# Patient Record
Sex: Female | Born: 1945 | Race: White | Hispanic: No | Smoking: Never smoker
Health system: Southern US, Community
[De-identification: ages and names within clinical notes are randomized; demographics above are authoritative.]

## PROBLEM LIST (undated history)

## (undated) DIAGNOSIS — Z932 Ileostomy status: Secondary | ICD-10-CM

## (undated) DIAGNOSIS — T8859XA Other complications of anesthesia, initial encounter: Secondary | ICD-10-CM

## (undated) DIAGNOSIS — IMO0002 Reserved for concepts with insufficient information to code with codable children: Secondary | ICD-10-CM

## (undated) DIAGNOSIS — Z9289 Personal history of other medical treatment: Secondary | ICD-10-CM

## (undated) DIAGNOSIS — Z931 Gastrostomy status: Secondary | ICD-10-CM

## (undated) DIAGNOSIS — C801 Malignant (primary) neoplasm, unspecified: Secondary | ICD-10-CM

## (undated) DIAGNOSIS — N189 Chronic kidney disease, unspecified: Secondary | ICD-10-CM

## (undated) DIAGNOSIS — I10 Essential (primary) hypertension: Secondary | ICD-10-CM

## (undated) DIAGNOSIS — Z789 Other specified health status: Secondary | ICD-10-CM

## (undated) DIAGNOSIS — Z95828 Presence of other vascular implants and grafts: Secondary | ICD-10-CM

## (undated) DIAGNOSIS — Z5189 Encounter for other specified aftercare: Secondary | ICD-10-CM

## (undated) DIAGNOSIS — T4145XA Adverse effect of unspecified anesthetic, initial encounter: Secondary | ICD-10-CM

## (undated) HISTORY — DX: Essential (primary) hypertension: I10

## (undated) HISTORY — DX: Encounter for other specified aftercare: Z51.89

## (undated) HISTORY — PX: CHOLECYSTECTOMY: SHX55

## (undated) HISTORY — PX: VAGINAL HYSTERECTOMY: SUR661

## (undated) HISTORY — DX: Malignant (primary) neoplasm, unspecified: C80.1

---

## 1999-07-16 ENCOUNTER — Other Ambulatory Visit: Admission: RE | Admit: 1999-07-16 | Discharge: 1999-07-16 | Payer: Self-pay | Admitting: *Deleted

## 2003-01-22 ENCOUNTER — Encounter: Admission: RE | Admit: 2003-01-22 | Discharge: 2003-01-22 | Payer: Self-pay | Admitting: Family Medicine

## 2003-01-22 ENCOUNTER — Encounter: Payer: Self-pay | Admitting: Family Medicine

## 2008-02-01 ENCOUNTER — Other Ambulatory Visit: Admission: RE | Admit: 2008-02-01 | Discharge: 2008-02-01 | Payer: Self-pay | Admitting: Family Medicine

## 2008-02-19 DIAGNOSIS — C801 Malignant (primary) neoplasm, unspecified: Secondary | ICD-10-CM

## 2008-02-19 HISTORY — DX: Malignant (primary) neoplasm, unspecified: C80.1

## 2008-02-28 ENCOUNTER — Encounter (HOSPITAL_COMMUNITY): Admission: RE | Admit: 2008-02-28 | Discharge: 2008-05-28 | Payer: Self-pay | Admitting: Gastroenterology

## 2008-04-02 ENCOUNTER — Inpatient Hospital Stay (HOSPITAL_COMMUNITY): Admission: RE | Admit: 2008-04-02 | Discharge: 2008-04-11 | Payer: Self-pay | Admitting: General Surgery

## 2008-04-02 ENCOUNTER — Encounter (INDEPENDENT_AMBULATORY_CARE_PROVIDER_SITE_OTHER): Payer: Self-pay | Admitting: General Surgery

## 2008-04-02 HISTORY — PX: LEFT COLECTOMY: SHX856

## 2008-04-17 ENCOUNTER — Ambulatory Visit: Payer: Self-pay | Admitting: Oncology

## 2008-05-21 LAB — CBC WITH DIFFERENTIAL/PLATELET
EOS%: 1.8 % (ref 0.0–7.0)
Eosinophils Absolute: 0.1 10*3/uL (ref 0.0–0.5)
MCV: 83.3 fL (ref 81.0–101.0)
MONO%: 4.5 % (ref 0.0–13.0)
NEUT#: 3.9 10*3/uL (ref 1.5–6.5)
RBC: 4.57 10*6/uL (ref 3.70–5.32)
RDW: 18.4 % — ABNORMAL HIGH (ref 11.3–14.5)

## 2008-05-24 LAB — COMPREHENSIVE METABOLIC PANEL
ALT: 9 U/L (ref 0–35)
AST: 12 U/L (ref 0–37)
Alkaline Phosphatase: 107 U/L (ref 39–117)
Creatinine, Ser: 0.72 mg/dL (ref 0.40–1.20)
Sodium: 141 mEq/L (ref 135–145)
Total Bilirubin: 0.5 mg/dL (ref 0.3–1.2)

## 2008-05-24 LAB — 5 HIAA, QUANTITATIVE, URINE, 24 HOUR: 5-HIAA, 24 Hr Urine: 13.3 mg/24 h — ABNORMAL HIGH (ref <=6.0)

## 2008-05-24 LAB — CHROMOGRANIN A: Chromogranin A: 19.8 ng/mL (ref <=36.4)

## 2008-07-14 ENCOUNTER — Ambulatory Visit: Payer: Self-pay | Admitting: Oncology

## 2008-07-25 LAB — 5 HIAA, QUANTITATIVE, URINE, 24 HOUR: 5-HIAA, 24 Hr Urine: 11.7 mg/24 h — ABNORMAL HIGH (ref <=6.0)

## 2008-10-24 ENCOUNTER — Ambulatory Visit: Payer: Self-pay | Admitting: Oncology

## 2008-11-01 LAB — 5 HIAA, QUANTITATIVE, URINE, 24 HOUR: Volume, Urine-5HIAA: 2050 mL/24 h

## 2008-12-25 ENCOUNTER — Ambulatory Visit: Payer: Self-pay | Admitting: Oncology

## 2009-02-19 ENCOUNTER — Ambulatory Visit: Payer: Self-pay | Admitting: Oncology

## 2009-03-13 LAB — 5 HIAA, QUANTITATIVE, URINE, 24 HOUR
5-HIAA, 24 Hr Urine: 10.7 mg/24 h — ABNORMAL HIGH (ref <=6.0)
Volume, Urine-5HIAA: 1700 mL/24 h

## 2009-04-23 ENCOUNTER — Ambulatory Visit: Payer: Self-pay | Admitting: Oncology

## 2009-06-18 ENCOUNTER — Ambulatory Visit: Payer: Self-pay | Admitting: Oncology

## 2009-07-18 ENCOUNTER — Ambulatory Visit: Payer: Self-pay | Admitting: Oncology

## 2009-07-18 LAB — CBC WITH DIFFERENTIAL/PLATELET
BASO%: 0.6 % (ref 0.0–2.0)
Eosinophils Absolute: 0.1 10*3/uL (ref 0.0–0.5)
HCT: 40 % (ref 34.8–46.6)
LYMPH%: 21.1 % (ref 14.0–49.7)
MCHC: 34.5 g/dL (ref 31.5–36.0)
MCV: 90.3 fL (ref 79.5–101.0)
MONO#: 0.2 10*3/uL (ref 0.1–0.9)
MONO%: 3.7 % (ref 0.0–14.0)
NEUT%: 72.6 % (ref 38.4–76.8)
Platelets: 252 10*3/uL (ref 145–400)
WBC: 6.4 10*3/uL (ref 3.9–10.3)

## 2009-07-23 LAB — COMPREHENSIVE METABOLIC PANEL
Alkaline Phosphatase: 148 U/L — ABNORMAL HIGH (ref 39–117)
CO2: 24 mEq/L (ref 19–32)
Creatinine, Ser: 0.9 mg/dL (ref 0.40–1.20)
Glucose, Bld: 142 mg/dL — ABNORMAL HIGH (ref 70–99)
Total Bilirubin: 0.4 mg/dL (ref 0.3–1.2)

## 2009-07-23 LAB — CHROMOGRANIN A

## 2009-08-11 ENCOUNTER — Ambulatory Visit (HOSPITAL_COMMUNITY): Admission: RE | Admit: 2009-08-11 | Discharge: 2009-08-11 | Payer: Self-pay | Admitting: Oncology

## 2009-08-14 ENCOUNTER — Ambulatory Visit: Payer: Self-pay | Admitting: Oncology

## 2009-09-15 ENCOUNTER — Ambulatory Visit: Payer: Self-pay | Admitting: Oncology

## 2009-10-10 ENCOUNTER — Ambulatory Visit: Payer: Self-pay | Admitting: Oncology

## 2009-11-06 ENCOUNTER — Ambulatory Visit: Payer: Self-pay | Admitting: Oncology

## 2009-12-09 ENCOUNTER — Ambulatory Visit: Payer: Self-pay | Admitting: Oncology

## 2010-01-08 ENCOUNTER — Ambulatory Visit: Payer: Self-pay | Admitting: Oncology

## 2010-01-12 LAB — CBC WITH DIFFERENTIAL/PLATELET
Eosinophils Absolute: 0.1 10*3/uL (ref 0.0–0.5)
HCT: 38.7 % (ref 34.8–46.6)
LYMPH%: 21 % (ref 14.0–49.7)
MCH: 31.4 pg (ref 25.1–34.0)
MCHC: 34 g/dL (ref 31.5–36.0)
MCV: 92.5 fL (ref 79.5–101.0)
MONO#: 0.4 10*3/uL (ref 0.1–0.9)
NEUT#: 4.9 10*3/uL (ref 1.5–6.5)
Platelets: 252 10*3/uL (ref 145–400)
lymph#: 1.4 10*3/uL (ref 0.9–3.3)

## 2010-01-12 LAB — COMPREHENSIVE METABOLIC PANEL
ALT: 42 U/L — ABNORMAL HIGH (ref 0–35)
AST: 51 U/L — ABNORMAL HIGH (ref 0–37)
Albumin: 3.7 g/dL (ref 3.5–5.2)
CO2: 27 mEq/L (ref 19–32)
Calcium: 8.8 mg/dL (ref 8.4–10.5)
Creatinine, Ser: 0.69 mg/dL (ref 0.40–1.20)

## 2010-01-15 LAB — 5 HIAA, QUANTITATIVE, URINE, 24 HOUR: 5-HIAA, 24 Hr Urine: 22 mg/24 h — ABNORMAL HIGH (ref <=6.0)

## 2010-01-16 LAB — CHROMOGRANIN A: Chromogranin A: 34 ng/mL — ABNORMAL HIGH (ref 1.9–15.0)

## 2010-02-03 LAB — COMPREHENSIVE METABOLIC PANEL
ALT: 48 U/L — ABNORMAL HIGH (ref 0–35)
AST: 67 U/L — ABNORMAL HIGH (ref 0–37)
CO2: 25 mEq/L (ref 19–32)
Calcium: 9.2 mg/dL (ref 8.4–10.5)
Potassium: 4.1 mEq/L (ref 3.5–5.3)
Sodium: 135 mEq/L (ref 135–145)
Total Protein: 7.6 g/dL (ref 6.0–8.3)

## 2010-02-10 LAB — CHROMOGRANIN A: Chromogranin A: 34 ng/mL — ABNORMAL HIGH (ref 1.9–15.0)

## 2010-02-25 ENCOUNTER — Ambulatory Visit (HOSPITAL_COMMUNITY): Admission: RE | Admit: 2010-02-25 | Discharge: 2010-02-25 | Payer: Self-pay | Admitting: Oncology

## 2010-02-27 ENCOUNTER — Ambulatory Visit: Payer: Self-pay | Admitting: Oncology

## 2010-03-31 ENCOUNTER — Ambulatory Visit: Payer: Self-pay | Admitting: Oncology

## 2010-04-14 ENCOUNTER — Ambulatory Visit (HOSPITAL_COMMUNITY): Admission: RE | Admit: 2010-04-14 | Discharge: 2010-04-14 | Payer: Self-pay | Admitting: Oncology

## 2010-05-22 ENCOUNTER — Ambulatory Visit: Payer: Self-pay | Admitting: Oncology

## 2010-05-25 LAB — CBC WITH DIFFERENTIAL/PLATELET
Basophils Absolute: 0.1 10*3/uL (ref 0.0–0.1)
HGB: 12.6 g/dL (ref 11.6–15.9)
MCH: 30.7 pg (ref 25.1–34.0)
MCHC: 33.9 g/dL (ref 31.5–36.0)
MCV: 90.5 fL (ref 79.5–101.0)
MONO#: 0.4 10*3/uL (ref 0.1–0.9)
MONO%: 5.2 % (ref 0.0–14.0)
NEUT%: 72.6 % (ref 38.4–76.8)
WBC: 8.4 10*3/uL (ref 3.9–10.3)
lymph#: 1.7 10*3/uL (ref 0.9–3.3)
nRBC: 0 % (ref 0–0)

## 2010-06-08 ENCOUNTER — Inpatient Hospital Stay (HOSPITAL_COMMUNITY): Admission: EM | Admit: 2010-06-08 | Discharge: 2010-06-17 | Payer: Self-pay | Admitting: Emergency Medicine

## 2010-06-08 ENCOUNTER — Encounter (INDEPENDENT_AMBULATORY_CARE_PROVIDER_SITE_OTHER): Payer: Self-pay | Admitting: Surgery

## 2010-06-08 HISTORY — PX: BOWEL RESECTION: SHX1257

## 2010-07-01 ENCOUNTER — Ambulatory Visit: Payer: Self-pay | Admitting: Oncology

## 2010-07-26 LAB — 5 HIAA, QUANTITATIVE, URINE, 24 HOUR: Volume, Urine-5HIAA: 1325 mL/24 h

## 2010-07-31 ENCOUNTER — Ambulatory Visit: Payer: Self-pay | Admitting: Oncology

## 2010-08-27 LAB — CBC WITH DIFFERENTIAL/PLATELET
EOS%: 1.8 % (ref 0.0–7.0)
Eosinophils Absolute: 0.1 10*3/uL (ref 0.0–0.5)
HCT: 37.5 % (ref 34.8–46.6)
HGB: 12.3 g/dL (ref 11.6–15.9)
LYMPH%: 28.6 % (ref 14.0–49.7)
MCH: 28.6 pg (ref 25.1–34.0)
MCHC: 32.7 g/dL (ref 31.5–36.0)
MCV: 87.5 fL (ref 79.5–101.0)
Platelets: 217 10*3/uL (ref 145–400)
RBC: 4.29 10*6/uL (ref 3.70–5.45)
RDW: 16.1 % — ABNORMAL HIGH (ref 11.2–14.5)
WBC: 5.3 10*3/uL (ref 3.9–10.3)
lymph#: 1.5 10*3/uL (ref 0.9–3.3)

## 2010-08-27 LAB — COMPREHENSIVE METABOLIC PANEL
ALT: 31 U/L (ref 0–35)
AST: 51 U/L — ABNORMAL HIGH (ref 0–37)
Chloride: 102 mEq/L (ref 96–112)
Potassium: 4.5 mEq/L (ref 3.5–5.3)
Total Bilirubin: 0.6 mg/dL (ref 0.3–1.2)

## 2010-09-22 ENCOUNTER — Ambulatory Visit: Payer: Self-pay | Admitting: Oncology

## 2010-09-24 LAB — COMPREHENSIVE METABOLIC PANEL
AST: 57 U/L — ABNORMAL HIGH (ref 0–37)
Albumin: 4.3 g/dL (ref 3.5–5.2)
BUN: 23 mg/dL (ref 6–23)
CO2: 26 mEq/L (ref 19–32)
Chloride: 100 mEq/L (ref 96–112)
Creatinine, Ser: 1.27 mg/dL — ABNORMAL HIGH (ref 0.40–1.20)
Glucose, Bld: 153 mg/dL — ABNORMAL HIGH (ref 70–99)
Potassium: 5 mEq/L (ref 3.5–5.3)
Sodium: 141 mEq/L (ref 135–145)
Total Bilirubin: 0.7 mg/dL (ref 0.3–1.2)
Total Protein: 7.3 g/dL (ref 6.0–8.3)

## 2010-09-24 LAB — CBC WITH DIFFERENTIAL/PLATELET
Eosinophils Absolute: 0.1 10*3/uL (ref 0.0–0.5)
LYMPH%: 29.7 % (ref 14.0–49.7)
MCHC: 33.8 g/dL (ref 31.5–36.0)
MCV: 87 fL (ref 79.5–101.0)
MONO#: 0.3 10*3/uL (ref 0.1–0.9)
RBC: 4.59 10*6/uL (ref 3.70–5.45)
RDW: 15.7 % — ABNORMAL HIGH (ref 11.2–14.5)
lymph#: 1.9 10*3/uL (ref 0.9–3.3)

## 2010-10-02 LAB — COMPREHENSIVE METABOLIC PANEL
Albumin: 4.2 g/dL (ref 3.5–5.2)
Alkaline Phosphatase: 175 U/L — ABNORMAL HIGH (ref 39–117)
Chloride: 104 mEq/L (ref 96–112)
Creatinine, Ser: 1.15 mg/dL (ref 0.40–1.20)
Glucose, Bld: 115 mg/dL — ABNORMAL HIGH (ref 70–99)
Potassium: 5.1 mEq/L (ref 3.5–5.3)
Sodium: 139 mEq/L (ref 135–145)
Total Bilirubin: 0.7 mg/dL (ref 0.3–1.2)
Total Protein: 7.3 g/dL (ref 6.0–8.3)

## 2010-10-02 LAB — CBC WITH DIFFERENTIAL/PLATELET
HCT: 36.5 % (ref 34.8–46.6)
MCHC: 34.3 g/dL (ref 31.5–36.0)
MONO#: 0.3 10*3/uL (ref 0.1–0.9)
NEUT#: 2.9 10*3/uL (ref 1.5–6.5)
RDW: 16.1 % — ABNORMAL HIGH (ref 11.2–14.5)

## 2010-10-22 ENCOUNTER — Ambulatory Visit: Payer: Self-pay | Admitting: Oncology

## 2010-10-30 LAB — CBC WITH DIFFERENTIAL/PLATELET
HCT: 37.5 % (ref 34.8–46.6)
HGB: 12.8 g/dL (ref 11.6–15.9)
LYMPH%: 25.3 % (ref 14.0–49.7)
MCHC: 34.1 g/dL (ref 31.5–36.0)
Platelets: 219 10*3/uL (ref 145–400)

## 2010-10-30 LAB — COMPREHENSIVE METABOLIC PANEL
ALT: 24 U/L (ref 0–35)
AST: 40 U/L — ABNORMAL HIGH (ref 0–37)
Albumin: 4.3 g/dL (ref 3.5–5.2)
BUN: 19 mg/dL (ref 6–23)
CO2: 25 mEq/L (ref 19–32)
Creatinine, Ser: 1.12 mg/dL (ref 0.40–1.20)
Potassium: 4.2 mEq/L (ref 3.5–5.3)
Sodium: 137 mEq/L (ref 135–145)
Total Bilirubin: 1 mg/dL (ref 0.3–1.2)
Total Protein: 8 g/dL (ref 6.0–8.3)

## 2010-11-24 ENCOUNTER — Ambulatory Visit: Payer: Self-pay | Admitting: Oncology

## 2010-11-24 LAB — 5 HIAA, QUANTITATIVE, URINE, 24 HOUR: 5-HIAA, 24 Hr Urine: 17.6 mg/24 h — ABNORMAL HIGH (ref <=6.0)

## 2010-11-26 LAB — CBC WITH DIFFERENTIAL/PLATELET
BASO%: 0.6 % (ref 0.0–2.0)
Basophils Absolute: 0 10*3/uL (ref 0.0–0.1)
EOS%: 2.2 % (ref 0.0–7.0)
LYMPH%: 22 % (ref 14.0–49.7)
MCH: 32.1 pg (ref 25.1–34.0)
MCHC: 33.4 g/dL (ref 31.5–36.0)
MCV: 96 fL (ref 79.5–101.0)
NEUT#: 3.5 10*3/uL (ref 1.5–6.5)
Platelets: 198 10*3/uL (ref 145–400)
WBC: 5 10*3/uL (ref 3.9–10.3)

## 2010-12-01 LAB — COMPREHENSIVE METABOLIC PANEL
AST: 37 U/L (ref 0–37)
Albumin: 4.2 g/dL (ref 3.5–5.2)
CO2: 28 mEq/L (ref 19–32)
Chloride: 101 mEq/L (ref 96–112)
Creatinine, Ser: 0.86 mg/dL (ref 0.40–1.20)
Glucose, Bld: 83 mg/dL (ref 70–99)

## 2010-12-01 LAB — CHROMOGRANIN A: Chromogranin A: 42 ng/mL — ABNORMAL HIGH (ref 1.9–15.0)

## 2010-12-24 ENCOUNTER — Ambulatory Visit: Payer: Self-pay | Admitting: Oncology

## 2010-12-25 LAB — CBC WITH DIFFERENTIAL/PLATELET
BASO%: 0.2 % (ref 0.0–2.0)
Basophils Absolute: 0 10*3/uL (ref 0.0–0.1)
EOS%: 2 % (ref 0.0–7.0)
Eosinophils Absolute: 0.1 10*3/uL (ref 0.0–0.5)
HCT: 35.7 % (ref 34.8–46.6)
HGB: 12.2 g/dL (ref 11.6–15.9)
LYMPH%: 23 % (ref 14.0–49.7)
MCH: 34.8 pg — ABNORMAL HIGH (ref 25.1–34.0)
MCHC: 34.2 g/dL (ref 31.5–36.0)
MCV: 101.8 fL — ABNORMAL HIGH (ref 79.5–101.0)
MONO#: 0.2 10*3/uL (ref 0.1–0.9)
MONO%: 4.2 % (ref 0.0–14.0)
NEUT#: 3.7 10*3/uL (ref 1.5–6.5)
NEUT%: 70.6 % (ref 38.4–76.8)
Platelets: 228 10*3/uL (ref 145–400)
RBC: 3.51 10*6/uL — ABNORMAL LOW (ref 3.70–5.45)
RDW: 19.2 % — ABNORMAL HIGH (ref 11.2–14.5)
WBC: 5.3 10*3/uL (ref 3.9–10.3)
lymph#: 1.2 10*3/uL (ref 0.9–3.3)

## 2010-12-25 LAB — COMPREHENSIVE METABOLIC PANEL
ALT: 20 U/L (ref 0–35)
AST: 33 U/L (ref 0–37)
Albumin: 4.1 g/dL (ref 3.5–5.2)
Alkaline Phosphatase: 155 U/L — ABNORMAL HIGH (ref 39–117)
BUN: 20 mg/dL (ref 6–23)
CO2: 24 mEq/L (ref 19–32)
Calcium: 8.8 mg/dL (ref 8.4–10.5)
Chloride: 104 mEq/L (ref 96–112)
Creatinine, Ser: 1.06 mg/dL (ref 0.40–1.20)
Glucose, Bld: 156 mg/dL — ABNORMAL HIGH (ref 70–99)
Potassium: 4.3 mEq/L (ref 3.5–5.3)
Sodium: 139 mEq/L (ref 135–145)
Total Bilirubin: 0.8 mg/dL (ref 0.3–1.2)
Total Protein: 7.2 g/dL (ref 6.0–8.3)

## 2011-01-29 ENCOUNTER — Other Ambulatory Visit: Payer: Self-pay | Admitting: Oncology

## 2011-01-29 ENCOUNTER — Encounter (HOSPITAL_BASED_OUTPATIENT_CLINIC_OR_DEPARTMENT_OTHER): Payer: BC Managed Care – PPO | Admitting: Oncology

## 2011-01-29 DIAGNOSIS — C7A012 Malignant carcinoid tumor of the ileum: Secondary | ICD-10-CM

## 2011-01-29 DIAGNOSIS — I10 Essential (primary) hypertension: Secondary | ICD-10-CM

## 2011-01-29 DIAGNOSIS — R197 Diarrhea, unspecified: Secondary | ICD-10-CM

## 2011-01-29 LAB — COMPREHENSIVE METABOLIC PANEL
ALT: 15 U/L (ref 0–35)
Alkaline Phosphatase: 147 U/L — ABNORMAL HIGH (ref 39–117)
CO2: 25 mEq/L (ref 19–32)
Glucose, Bld: 108 mg/dL — ABNORMAL HIGH (ref 70–99)
Potassium: 4.5 mEq/L (ref 3.5–5.3)
Total Bilirubin: 0.5 mg/dL (ref 0.3–1.2)

## 2011-01-29 LAB — CBC WITH DIFFERENTIAL/PLATELET
EOS%: 1.9 % (ref 0.0–7.0)
LYMPH%: 22.4 % (ref 14.0–49.7)
MCH: 34.9 pg — ABNORMAL HIGH (ref 25.1–34.0)
NEUT#: 4.1 10*3/uL (ref 1.5–6.5)
NEUT%: 69.9 % (ref 38.4–76.8)
RBC: 3.52 10*6/uL — ABNORMAL LOW (ref 3.70–5.45)
RDW: 15.7 % — ABNORMAL HIGH (ref 11.2–14.5)

## 2011-02-11 ENCOUNTER — Encounter (HOSPITAL_BASED_OUTPATIENT_CLINIC_OR_DEPARTMENT_OTHER): Payer: BC Managed Care – PPO | Admitting: Oncology

## 2011-02-11 DIAGNOSIS — C7A012 Malignant carcinoid tumor of the ileum: Secondary | ICD-10-CM

## 2011-02-24 ENCOUNTER — Other Ambulatory Visit: Payer: Self-pay | Admitting: Oncology

## 2011-02-24 ENCOUNTER — Encounter (HOSPITAL_BASED_OUTPATIENT_CLINIC_OR_DEPARTMENT_OTHER): Payer: BC Managed Care – PPO | Admitting: Oncology

## 2011-02-24 DIAGNOSIS — C7A012 Malignant carcinoid tumor of the ileum: Secondary | ICD-10-CM

## 2011-02-24 DIAGNOSIS — I10 Essential (primary) hypertension: Secondary | ICD-10-CM

## 2011-02-24 DIAGNOSIS — R197 Diarrhea, unspecified: Secondary | ICD-10-CM

## 2011-02-24 LAB — CBC WITH DIFFERENTIAL/PLATELET
BASO%: 0.4 % (ref 0.0–2.0)
Basophils Absolute: 0 10*3/uL (ref 0.0–0.1)
EOS%: 1.4 % (ref 0.0–7.0)
Eosinophils Absolute: 0.1 10*3/uL (ref 0.0–0.5)
LYMPH%: 21.9 % (ref 14.0–49.7)
MCV: 100 fL (ref 79.5–101.0)
MONO%: 5.1 % (ref 0.0–14.0)
Platelets: 184 10*3/uL (ref 145–400)
RDW: 15.9 % — ABNORMAL HIGH (ref 11.2–14.5)
lymph#: 1.2 10*3/uL (ref 0.9–3.3)

## 2011-02-27 LAB — 5 HIAA, QUANTITATIVE, URINE, 24 HOUR: Volume, Urine-5HIAA: 1475 mL/24 h

## 2011-02-28 LAB — COMPREHENSIVE METABOLIC PANEL
ALT: 17 U/L (ref 0–35)
AST: 30 U/L (ref 0–37)
Alkaline Phosphatase: 135 U/L — ABNORMAL HIGH (ref 39–117)
Calcium: 9.1 mg/dL (ref 8.4–10.5)
Potassium: 4.6 mEq/L (ref 3.5–5.3)
Sodium: 140 mEq/L (ref 135–145)
Total Bilirubin: 1 mg/dL (ref 0.3–1.2)

## 2011-03-05 ENCOUNTER — Encounter (HOSPITAL_BASED_OUTPATIENT_CLINIC_OR_DEPARTMENT_OTHER): Payer: BC Managed Care – PPO | Admitting: Oncology

## 2011-03-05 DIAGNOSIS — C7A012 Malignant carcinoid tumor of the ileum: Secondary | ICD-10-CM

## 2011-03-07 LAB — GLUCOSE, CAPILLARY
Glucose-Capillary: 117 mg/dL — ABNORMAL HIGH (ref 70–99)
Glucose-Capillary: 135 mg/dL — ABNORMAL HIGH (ref 70–99)
Glucose-Capillary: 138 mg/dL — ABNORMAL HIGH (ref 70–99)
Glucose-Capillary: 138 mg/dL — ABNORMAL HIGH (ref 70–99)
Glucose-Capillary: 145 mg/dL — ABNORMAL HIGH (ref 70–99)
Glucose-Capillary: 145 mg/dL — ABNORMAL HIGH (ref 70–99)
Glucose-Capillary: 149 mg/dL — ABNORMAL HIGH (ref 70–99)
Glucose-Capillary: 158 mg/dL — ABNORMAL HIGH (ref 70–99)
Glucose-Capillary: 161 mg/dL — ABNORMAL HIGH (ref 70–99)
Glucose-Capillary: 170 mg/dL — ABNORMAL HIGH (ref 70–99)
Glucose-Capillary: 176 mg/dL — ABNORMAL HIGH (ref 70–99)
Glucose-Capillary: 179 mg/dL — ABNORMAL HIGH (ref 70–99)
Glucose-Capillary: 181 mg/dL — ABNORMAL HIGH (ref 70–99)
Glucose-Capillary: 184 mg/dL — ABNORMAL HIGH (ref 70–99)
Glucose-Capillary: 196 mg/dL — ABNORMAL HIGH (ref 70–99)
Glucose-Capillary: 198 mg/dL — ABNORMAL HIGH (ref 70–99)
Glucose-Capillary: 201 mg/dL — ABNORMAL HIGH (ref 70–99)
Glucose-Capillary: 202 mg/dL — ABNORMAL HIGH (ref 70–99)

## 2011-03-07 LAB — CBC
HCT: 23.1 % — ABNORMAL LOW (ref 36.0–46.0)
HCT: 39.2 % (ref 36.0–46.0)
HCT: 40.9 % (ref 36.0–46.0)
Hemoglobin: 10.7 g/dL — ABNORMAL LOW (ref 12.0–15.0)
Hemoglobin: 8.1 g/dL — ABNORMAL LOW (ref 12.0–15.0)
MCHC: 32.4 g/dL (ref 30.0–36.0)
MCHC: 32.8 g/dL (ref 30.0–36.0)
MCHC: 32.9 g/dL (ref 30.0–36.0)
MCHC: 34 g/dL (ref 30.0–36.0)
MCV: 91.2 fL (ref 78.0–100.0)
MCV: 93.1 fL (ref 78.0–100.0)
MCV: 93.4 fL (ref 78.0–100.0)
Platelets: 261 10*3/uL (ref 150–400)
Platelets: 346 10*3/uL (ref 150–400)
Platelets: 353 10*3/uL (ref 150–400)
RBC: 2.66 MIL/uL — ABNORMAL LOW (ref 3.87–5.11)
RBC: 2.74 MIL/uL — ABNORMAL LOW (ref 3.87–5.11)
RBC: 4.19 MIL/uL (ref 3.87–5.11)
RDW: 13.7 % (ref 11.5–15.5)
RDW: 13.8 % (ref 11.5–15.5)
RDW: 14 % (ref 11.5–15.5)
WBC: 10.1 10*3/uL (ref 4.0–10.5)
WBC: 20.1 10*3/uL — ABNORMAL HIGH (ref 4.0–10.5)
WBC: 8.5 10*3/uL (ref 4.0–10.5)

## 2011-03-07 LAB — DIFFERENTIAL
Basophils Absolute: 0 10*3/uL (ref 0.0–0.1)
Basophils Absolute: 0 10*3/uL (ref 0.0–0.1)
Basophils Absolute: 0.1 10*3/uL (ref 0.0–0.1)
Basophils Relative: 0 % (ref 0–1)
Basophils Relative: 0 % (ref 0–1)
Basophils Relative: 1 % (ref 0–1)
Basophils Relative: 1 % (ref 0–1)
Eosinophils Absolute: 0 10*3/uL (ref 0.0–0.7)
Eosinophils Absolute: 0 10*3/uL (ref 0.0–0.7)
Eosinophils Absolute: 0 10*3/uL (ref 0.0–0.7)
Eosinophils Relative: 0 % (ref 0–5)
Eosinophils Relative: 1 % (ref 0–5)
Lymphocytes Relative: 20 % (ref 12–46)
Lymphs Abs: 0.7 10*3/uL (ref 0.7–4.0)
Lymphs Abs: 0.8 10*3/uL (ref 0.7–4.0)
Lymphs Abs: 4 10*3/uL (ref 0.7–4.0)
Monocytes Absolute: 0.2 10*3/uL (ref 0.1–1.0)
Monocytes Absolute: 0.2 10*3/uL (ref 0.1–1.0)
Monocytes Relative: 3 % (ref 3–12)
Monocytes Relative: 6 % (ref 3–12)
Neutro Abs: 15.4 10*3/uL — ABNORMAL HIGH (ref 1.7–7.7)
Neutro Abs: 7 10*3/uL (ref 1.7–7.7)
Neutro Abs: 8.3 10*3/uL — ABNORMAL HIGH (ref 1.7–7.7)
Neutrophils Relative %: 76 % (ref 43–77)
Neutrophils Relative %: 82 % — ABNORMAL HIGH (ref 43–77)
Neutrophils Relative %: 88 % — ABNORMAL HIGH (ref 43–77)
Neutrophils Relative %: 90 % — ABNORMAL HIGH (ref 43–77)

## 2011-03-07 LAB — BASIC METABOLIC PANEL
BUN: 15 mg/dL (ref 6–23)
BUN: 16 mg/dL (ref 6–23)
BUN: 17 mg/dL (ref 6–23)
BUN: 7 mg/dL (ref 6–23)
BUN: 8 mg/dL (ref 6–23)
CO2: 21 mEq/L (ref 19–32)
CO2: 22 mEq/L (ref 19–32)
CO2: 24 mEq/L (ref 19–32)
CO2: 26 mEq/L (ref 19–32)
Calcium: 7.2 mg/dL — ABNORMAL LOW (ref 8.4–10.5)
Calcium: 7.5 mg/dL — ABNORMAL LOW (ref 8.4–10.5)
Calcium: 7.9 mg/dL — ABNORMAL LOW (ref 8.4–10.5)
Calcium: 8 mg/dL — ABNORMAL LOW (ref 8.4–10.5)
Chloride: 102 mEq/L (ref 96–112)
Chloride: 104 mEq/L (ref 96–112)
Chloride: 107 mEq/L (ref 96–112)
Creatinine, Ser: 0.58 mg/dL (ref 0.4–1.2)
Creatinine, Ser: 0.65 mg/dL (ref 0.4–1.2)
Creatinine, Ser: 1 mg/dL (ref 0.4–1.2)
Creatinine, Ser: 1.07 mg/dL (ref 0.4–1.2)
Creatinine, Ser: 1.08 mg/dL (ref 0.4–1.2)
GFR calc Af Amer: >60 mL/min (ref >60)
GFR calc non Af Amer: 51 mL/min — ABNORMAL LOW (ref >60)
GFR calc non Af Amer: 52 mL/min — ABNORMAL LOW (ref >60)
GFR calc non Af Amer: 55 mL/min — ABNORMAL LOW (ref >60)
GFR calc non Af Amer: >60 mL/min (ref >60)
Glucose, Bld: 139 mg/dL — ABNORMAL HIGH (ref 70–99)
Glucose, Bld: 198 mg/dL — ABNORMAL HIGH (ref 70–99)
Glucose, Bld: 211 mg/dL — ABNORMAL HIGH (ref 70–99)
Glucose, Bld: 241 mg/dL — ABNORMAL HIGH (ref 70–99)
Potassium: 3.3 mEq/L — ABNORMAL LOW (ref 3.5–5.1)
Potassium: 6 mEq/L — ABNORMAL HIGH (ref 3.5–5.1)
Sodium: 136 mEq/L (ref 135–145)
Sodium: 136 mEq/L (ref 135–145)

## 2011-03-07 LAB — COMPREHENSIVE METABOLIC PANEL
ALT: 17 U/L (ref 0–35)
AST: 16 U/L (ref 0–37)
Alkaline Phosphatase: 62 U/L (ref 39–117)
Alkaline Phosphatase: 94 U/L (ref 39–117)
BUN: 10 mg/dL (ref 6–23)
CO2: 27 mEq/L (ref 19–32)
CO2: 29 mEq/L (ref 19–32)
Chloride: 102 mEq/L (ref 96–112)
GFR calc Af Amer: >60 mL/min (ref >60)
GFR calc non Af Amer: >60 mL/min (ref >60)
GFR calc non Af Amer: >60 mL/min (ref >60)
Glucose, Bld: 157 mg/dL — ABNORMAL HIGH (ref 70–99)
Glucose, Bld: 165 mg/dL — ABNORMAL HIGH (ref 70–99)
Potassium: 3 mEq/L — ABNORMAL LOW (ref 3.5–5.1)
Potassium: 4 mEq/L (ref 3.5–5.1)
Sodium: 136 mEq/L (ref 135–145)
Total Bilirubin: 0.7 mg/dL (ref 0.3–1.2)
Total Protein: 4.9 g/dL — ABNORMAL LOW (ref 6.0–8.3)

## 2011-03-07 LAB — PREALBUMIN: Prealbumin: 8.6 mg/dL — ABNORMAL LOW (ref 18.0–45.0)

## 2011-03-07 LAB — PHOSPHORUS
Phosphorus: 2.5 mg/dL (ref 2.3–4.6)
Phosphorus: 4.5 mg/dL (ref 2.3–4.6)

## 2011-03-07 LAB — URINE MICROSCOPIC-ADD ON

## 2011-03-07 LAB — LACTIC ACID, PLASMA: Lactic Acid, Venous: 8.7 mmol/L — ABNORMAL HIGH (ref 0.5–2.2)

## 2011-03-07 LAB — TRIGLYCERIDES: Triglycerides: 170 mg/dL — ABNORMAL HIGH (ref <150)

## 2011-03-07 LAB — HEMOGLOBIN A1C
Hgb A1c MFr Bld: 7.8 % — ABNORMAL HIGH (ref <5.7)
Mean Plasma Glucose: 177 mg/dL — ABNORMAL HIGH (ref <117)

## 2011-03-07 LAB — MAGNESIUM
Magnesium: 1.7 mg/dL (ref 1.5–2.5)
Magnesium: 1.8 mg/dL (ref 1.5–2.5)

## 2011-03-07 LAB — URINALYSIS, ROUTINE W REFLEX MICROSCOPIC
Glucose, UA: >1000 mg/dL — AB
Leukocytes, UA: NEGATIVE
Specific Gravity, Urine: >1.046 — ABNORMAL HIGH (ref 1.005–1.030)
pH: 5 (ref 5.0–8.0)

## 2011-03-12 ENCOUNTER — Encounter (HOSPITAL_BASED_OUTPATIENT_CLINIC_OR_DEPARTMENT_OTHER): Payer: BC Managed Care – PPO | Admitting: Oncology

## 2011-03-12 DIAGNOSIS — R197 Diarrhea, unspecified: Secondary | ICD-10-CM

## 2011-03-12 DIAGNOSIS — C7A012 Malignant carcinoid tumor of the ileum: Secondary | ICD-10-CM

## 2011-04-09 ENCOUNTER — Encounter (HOSPITAL_BASED_OUTPATIENT_CLINIC_OR_DEPARTMENT_OTHER): Payer: BC Managed Care – PPO | Admitting: Oncology

## 2011-04-09 DIAGNOSIS — C7A012 Malignant carcinoid tumor of the ileum: Secondary | ICD-10-CM

## 2011-05-04 NOTE — Discharge Summary (Signed)
NAME:  Lori Dennis, Lori Dennis NO.:  [id number]   MEDICAL RECORD NO.:  [id number]         PATIENT TYPE:  LINP   LOCATION:                               FACILITY:  [facility]   PHYSICIAN:  Adolph Pollack, M.D.DATE OF BIRTH:  [birth year]   DATE OF ADMISSION:  04/02/2008  DATE OF DISCHARGE:  04/11/2008                               DISCHARGE SUMMARY   FINAL DIAGNOSIS:  Metastatic carcinoid tumor.   SECONDARY DIAGNOSES:  1. Partial small-bowel obstruction.  2. Hypertension.  3. Anxiety with depression.  4. Hyperlipidemia.  5. Acute blood loss anemia.  6. Hypovolemia.  7. Postoperative ileus.  8. Wound infection.   PROCEDURE:  Diagnostic laparoscopy, exploratory laparotomy with sigmoid  colectomy and biopsy, biopsy of omental nodule, extended right  colectomy, descending colostomy, gastrojejunostomy on 04/02/2008.   REASON FOR ADMISSION:  This 65 year old female had been having  surveillance on her ovary, her sister had ovarian cancer, and she was  noted to have a mass in the right lower quadrant abdominal area.  On CT  scan, this is suspicious for a carcinoid tumor.  Colonoscopy was  performed and Dr. Randa Evens noted narrowing of the sigmoid colon and  abnormality of the ileocecal valve.  Biopsy of the ileocecal valve  demonstrated findings consistent with carcinoid tumor.  She had  partially obstructing symptoms, but then these improved after the  swelling from colonoscopy went down and she was admitted for an elective  procedure.   HOSPITAL COURSE:  She underwent the above procedure.  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  She had three points of partial obstruction, one in  the mid sigmoid colon, one at the ileocecal valve, and one at the third  portion of duodenum, were tumor was growing into the duodenum which had  involved the right colon.  She subsequently underwent that above  procedure.   Postoperatively she had some hypovolemia, which was improved  IV fluids, and also had some acute blood loss anemia.  She was given a  transfusion for this, with a total of approximately 3 units of packed  red blood cells over a couple days.  She had an ileus with some bilious  NG output and she was watched in the step-down.  She was then  transferred to the floor.  She began having colostomy output and, as  such, we were able to remove the NG tube and start her on a diet.  There  were some problems with leakage around the colostomy appliance, but the  wound care nurses saw her and gave her special appliances and this  controlled it well.  She had some erythema of the wound and drainage.  This was opened up and the wound infection was drained and packed and  the wound looked good.  We prepared her for home health nursing.  On  04/11/2008, she was tolerating a diet, was feeling stronger, ambulating,  had good bowel function, and was discharged.   DISPOSITION:  Discharged to home on 04/11/2008 in satisfactory  condition.  Will have daily home health nursing visits arranged  for her  to assist her with colostomy care, as well as wound care.  The plan is  to arrange for an outpatient medical oncology visit for further  evaluation and potential treatment of her widespread carcinoid tumor  with intraperitoneal involvement, as she had had some carcinoid-type  symptoms prior to this.  We will keep her on all of her home  medications, give her Vicodin for pain and iron for anemia, and to see  her back in the office in one to two weeks.      Adolph Pollack, M.D.  Electronically Signed     TJR/MEDQ  D:  04/22/2008  T:  04/22/2008  Job:  [id number]   cc:   Fayrene Fearing L. Malon Kindle., M.D.  Fax: [phone]   Oswaldo Done, N.P.

## 2011-05-04 NOTE — Op Note (Signed)
NAME:  Lori Dennis, Lori Dennis NO.:  [id number]   MEDICAL RECORD NO.:  [id number]          PATIENT TYPE:  INP   LOCATION:  1235                         FACILITY:  [facility]   PHYSICIAN:  Adolph Pollack, M.D.DATE OF BIRTH:  02/03/1946   DATE OF PROCEDURE:  04/02/2008  DATE OF DISCHARGE:                               OPERATIVE REPORT   PREOPERATIVE DIAGNOSIS:  Abdominal carcinoid tumor.   POSTOPERATIVE DIAGNOSIS:  Widely metastatic intraperitoneal carcinoid  tumor.   PROCEDURES:  1. Diagnostic laparoscopy.  2. Exploratory laparotomy, biopsy of omental nodule, sigmoid colectomy      with descending colostomy, extended right colectomy,      gastrojejunostomy.   SURGEON:  Adolph Pollack, M.D.   ASSISTANT:  Claud Kelp, M.D.   ANESTHESIA:  General.   FINDINGS:  There was widespread miliary and 1 cm or slightly more tumor  throughout the abdominal cavity in the mesentery with fairly heavily  concentration in the pelvis including the serosal surface of the rectum  and distal sigmoid colon.  There was a very small involvement of the  liver.  There was partial obstruction of the mid sigmoid colon from  tumor.  There was near complete obstruction of the right colon at the  ileocecal valve.  There was dense adherence of tumor to the duodenum  with impending obstruction here.   INDICATIONS:  This is a 65 year old female who was noted to have an  abnormality of one of her ovaries on an ultrasound.  A CT scan  demonstrated a mass in the right abdominal area.  She underwent a  colonoscopy.  An abnormality at the ileocecal valve was noted as well as  some of the sigmoid colon.  Colon biopsy was consistent with  inflammatory changes.  In the ileocecal valve biopsy was consistent with  carcinoid tumor.  She has an elevation of 5 HIA level in the urine.  She  had an octreotide scan which shows the area in the right abdomen.  She  now presents for resection of the carcinoid  tumor.   TECHNIQUE:  She was seen in the holding area and brought to the  operating room, placed supine on the operating table and a general  anesthetic was administered.  Foley catheter was placed in bladder.  The  abdominal wall was sterilely prepped and draped.  I made a small  incision in the left upper quadrant and using a 5 mm Optiview trocar  gained entrance into the peritoneal cavity and created pneumoperitoneum.  I then introduced a 5 mm scope and no damage to the underlying viscera  was noted.  On inspecting the area, I noted diffuse miliary involvement  of some of the peritoneum as well as the mesentery and areas in the  pelvis.  I felt that I was not going to be able to do an adequate  procedure laparoscopically, thus I terminated the laparoscopic  procedure.   Following termination of the laparoscopic procedure, I made a long  midline incision dividing the skin, subcutaneous tissue, fascia and  peritoneum entering the peritoneal cavity.  She had a previous right  upper  quadrant incision from cholecystectomy.  I took down omental  adhesions from this.  I then explored the abdomen.  In the mid sigmoid  colon there was a dense stricture secondary to tumor and multiple tumor  implants in the distal sigmoid colon and rectal serosa as well as  multiple tumor implants in the pelvis.  Some of these were  subcentimeter, some of these were slightly greater than a centimeter.  There were multiple tumor implants in the mesentery of the small bowel.  There were multiple tumor implants in the left and right gutters.  There  is a mass densely adherent to the retroperitoneum in the right mid  abdomen.  There is dilated large intestine from the area of partial  obstruction in the mid sigmoid colon back to the ileocecal valve region  with thickening of the colon.   I addressed my attention to the stricture in the sigmoid colon secondary  to tumor.  I mobilized the sigmoid and descending  colon by dividing  lateral attachments immediately adjacent to the colon.  I identified the  left ureter and placed a vessel loop around it.  I then resected a  segment of the sigmoid colon with tumor in it and dividing its mesentery  close to the bowel.  No injury to the left ureter was noted.  I left  both the distal and proximal end stapled off.  In examining the distal  end because of the diffuse involvement of the serosa of the sigmoid  colon and rectum with tumor, I saw no way to perform an anastomosis,  thus planned on doing a colostomy.   Following this, I addressed the right colon.  I began mobilizing it from  its lateral attachments.  I noticed an omental nodule, biopsied this and  this came back consistent with metastatic carcinoma.  I mobilized the  omentum free from the mid transverse colon.  In trying to mobilize the  ileocecal valve, I noticed that it and tumor were densely adherent to  the second and third portions of the duodenum.  Staying very close to  the bowel, I divided the mesentery and actually divided tumor between  the bowel and the duodenum leaving the duodenum unharmed but leaving  tumor on the duodenum to allow me to mobilize the ileocecal area and the  colon and bring it up to the wound.  Once I had done that, I chose a  point in the distal ileum and divided that with the stapler and then  divided the mid transverse colon with the stapler.  I then divided the  mesentery close to the bowel and handed the specimen off the field as  approximately 20 cm of distal ileum and extended right colon.   I then performed a side-to-side anastomosis between the distal ileum and  transverse colon in a stapled fashion closing the common defect with the  linear non cutting stapler and reinforcing the distal staple line with a  silk suture.  The anastomosis was patent, viable and under no tension.   Following this, I looked at the duodenum and could tell that there would   be an impending obstruction from tumor infiltrating at some point in  time.  Thus I decided to do an empiric gastrojejunostomy.  I identified  the ligament of Treitz and went a distance distal to it.  I then brought  the jejunum anticolic and was able to get into the lesser sac.  I  created a stapled anastomosis  in anticolic fashion between a loop of  jejunum and the stomach and closed the common defect with a linear non  cutting stapler.  The NG tube was positioned proximal to this.  The  anastomosis was patent and viable and under no tension.   Following this I copiously irrigated out the area and noted some  bleeding from both gutters which was controlled with ties and cautery.  I had identified the right ureter and put a vessel loop on it.  I  removed both vessel loops and the ureters appeared to be nondilated,  intact and had peristalsis.  I then copiously irrigated out the  abdominal cavity with saline solution.  I noted at this time that  hemostasis was adequate.   I requested a sponge count which was reported to be correct.   I then chose a point in the left mid abdominal wall to create a stoma  and did a circular incision of skin and subcutaneous tissue here, then a  cruciate incision in the fascia two fingerbreadths width.  I brought up  the distal colon stump up through the wound and anchored it to the  posterior abdominal wall with interrupted silk sutures.  Following this  I then closed the fascia with a running #1 PDS suture.  At this point  needle, sponge, and instrument counts were reported to be correct.  I  then irrigated out the subcutaneous tissue and closed the trocar wound  and the skin at the laparotomy wound with staples.   Following this I matured the colostomy with interrupted 3-0 Vicryl  sutures and a stomal appliance was applied.  Sterile dressings were  placed over the wound.   She tolerated the procedure well.  There were no apparent complications.  She  had at least 1200 mL of blood loss.  She subsequently was taken to  the recovery room in satisfactory condition.      Adolph Pollack, M.D.  Electronically Signed     TJR/MEDQ  D:  04/03/2008  T:  04/03/2008  Job:  [id number]   cc:   Fayrene Fearing L. Malon Kindle., M.D.  Fax: [phone]

## 2011-05-07 ENCOUNTER — Encounter (HOSPITAL_BASED_OUTPATIENT_CLINIC_OR_DEPARTMENT_OTHER): Payer: BC Managed Care – PPO | Admitting: Oncology

## 2011-05-07 DIAGNOSIS — R197 Diarrhea, unspecified: Secondary | ICD-10-CM

## 2011-05-07 DIAGNOSIS — C7A012 Malignant carcinoid tumor of the ileum: Secondary | ICD-10-CM

## 2011-06-04 ENCOUNTER — Encounter (HOSPITAL_BASED_OUTPATIENT_CLINIC_OR_DEPARTMENT_OTHER): Payer: BC Managed Care – PPO | Admitting: Oncology

## 2011-06-04 DIAGNOSIS — R197 Diarrhea, unspecified: Secondary | ICD-10-CM

## 2011-06-04 DIAGNOSIS — C7A012 Malignant carcinoid tumor of the ileum: Secondary | ICD-10-CM

## 2011-06-04 DIAGNOSIS — I10 Essential (primary) hypertension: Secondary | ICD-10-CM

## 2011-07-02 ENCOUNTER — Encounter: Payer: BC Managed Care – PPO | Admitting: Oncology

## 2011-07-26 ENCOUNTER — Encounter (HOSPITAL_BASED_OUTPATIENT_CLINIC_OR_DEPARTMENT_OTHER): Payer: BC Managed Care – PPO | Admitting: Oncology

## 2011-07-26 ENCOUNTER — Other Ambulatory Visit: Payer: Self-pay | Admitting: Oncology

## 2011-07-26 DIAGNOSIS — I10 Essential (primary) hypertension: Secondary | ICD-10-CM

## 2011-07-26 DIAGNOSIS — R197 Diarrhea, unspecified: Secondary | ICD-10-CM

## 2011-07-26 DIAGNOSIS — C7A012 Malignant carcinoid tumor of the ileum: Secondary | ICD-10-CM

## 2011-07-29 LAB — 5 HIAA, QUANTITATIVE, URINE, 24 HOUR
5-HIAA, 24 Hr Urine: 19.1 mg/24 h — ABNORMAL HIGH (ref <=6.0)
Volume, Urine-5HIAA: 1800 mL/24 h

## 2011-07-29 LAB — CHROMOGRANIN A: Chromogranin A: 38 ng/mL — ABNORMAL HIGH (ref 1.9–15.0)

## 2011-07-29 LAB — COMPREHENSIVE METABOLIC PANEL
ALT: 19 U/L (ref 0–35)
AST: 27 U/L (ref 0–37)
Alkaline Phosphatase: 121 U/L — ABNORMAL HIGH (ref 39–117)
BUN: 15 mg/dL (ref 6–23)
Calcium: 8.9 mg/dL (ref 8.4–10.5)
Creatinine, Ser: 0.89 mg/dL (ref 0.50–1.10)
Total Bilirubin: 0.5 mg/dL (ref 0.3–1.2)

## 2011-08-02 ENCOUNTER — Encounter (HOSPITAL_BASED_OUTPATIENT_CLINIC_OR_DEPARTMENT_OTHER): Payer: BC Managed Care – PPO | Admitting: Oncology

## 2011-08-02 DIAGNOSIS — R197 Diarrhea, unspecified: Secondary | ICD-10-CM

## 2011-08-02 DIAGNOSIS — C7A012 Malignant carcinoid tumor of the ileum: Secondary | ICD-10-CM

## 2011-08-30 ENCOUNTER — Encounter (HOSPITAL_BASED_OUTPATIENT_CLINIC_OR_DEPARTMENT_OTHER): Payer: BC Managed Care – PPO | Admitting: Oncology

## 2011-08-30 DIAGNOSIS — R197 Diarrhea, unspecified: Secondary | ICD-10-CM

## 2011-08-30 DIAGNOSIS — C7A012 Malignant carcinoid tumor of the ileum: Secondary | ICD-10-CM

## 2011-09-14 LAB — HEMOGLOBIN: Hemoglobin: 8.3 — ABNORMAL LOW

## 2011-09-14 LAB — CBC
HCT: 27.7 — ABNORMAL LOW
HCT: 38.7
HCT: 21.6 — ABNORMAL LOW
HCT: 25.5 — ABNORMAL LOW
HCT: 26.5 — ABNORMAL LOW
Hemoglobin: 13
Hemoglobin: 9.1 — ABNORMAL LOW
Hemoglobin: 7.1 — CL
Hemoglobin: 7.9 — CL
Hemoglobin: 8.5 — ABNORMAL LOW
Hemoglobin: 8.9 — ABNORMAL LOW
MCHC: 32.7
MCHC: 33.6
MCHC: 33.4
MCHC: 33.4
MCHC: 33.5
MCHC: 33.8
MCV: 82
MCV: 82.2
MCV: 84.4
MCV: 84.6
Platelets: 210
Platelets: 225
Platelets: 266
Platelets: 294
RBC: 4.72
RBC: 2.53 — ABNORMAL LOW
RBC: 2.78 — ABNORMAL LOW
RDW: 15.2
RDW: 14.6
RDW: 15.2
RDW: 15.4
RDW: 15.4
RDW: 15.5
RDW: 15.8 — ABNORMAL HIGH
WBC: 10.8 — ABNORMAL HIGH

## 2011-09-14 LAB — COMPREHENSIVE METABOLIC PANEL
ALT: 13
BUN: 8
CO2: 29
Calcium: 9.3
GFR calc non Af Amer: >60
Glucose, Bld: 139 — ABNORMAL HIGH
Sodium: 142

## 2011-09-14 LAB — BASIC METABOLIC PANEL
BUN: 11
BUN: 13
CO2: 23
CO2: 27
Calcium: 7.5 — ABNORMAL LOW
Calcium: 7.2 — ABNORMAL LOW
Calcium: 7.2 — ABNORMAL LOW
Chloride: 109
Creatinine, Ser: 1.46 — ABNORMAL HIGH
GFR calc non Af Amer: 36 — ABNORMAL LOW
GFR calc non Af Amer: >60
Glucose, Bld: 199 — ABNORMAL HIGH
Glucose, Bld: 209 — ABNORMAL HIGH
Potassium: 3.4 — ABNORMAL LOW
Potassium: 3.5
Sodium: 139
Sodium: 142

## 2011-09-14 LAB — TYPE AND SCREEN: ABO/RH(D): A POS

## 2011-09-14 LAB — POCT I-STAT 7, (LYTES, BLD GAS, ICA,H+H)
Acid-Base Excess: 4 — ABNORMAL HIGH
Bicarbonate: 27.6 — ABNORMAL HIGH
Hemoglobin: 5.1 — CL
Sodium: 139
TCO2: 29
pH, Arterial: 7.474 — ABNORMAL HIGH

## 2011-09-14 LAB — DIFFERENTIAL
Basophils Relative: 0
Eosinophils Absolute: 0.1
Lymphs Abs: 2.2
Neutro Abs: 5.1
Neutrophils Relative %: 66

## 2011-09-14 LAB — POCT I-STAT 4, (NA,K, GLUC, HGB,HCT)
Glucose, Bld: 177 — ABNORMAL HIGH
HCT: 19 — ABNORMAL LOW
Operator id: 247171
Operator id: 247171
Potassium: 2.7 — CL
Sodium: 135

## 2011-09-14 LAB — POCT I-STAT, CHEM 8
BUN: 9
Creatinine, Ser: 0.7
Glucose, Bld: 164 — ABNORMAL HIGH
Hemoglobin: 7.8 — CL
Potassium: 2.8 — ABNORMAL LOW

## 2011-09-14 LAB — ABO/RH: ABO/RH(D): A POS

## 2011-09-14 LAB — PROTIME-INR
INR: 0.9
Prothrombin Time: 12.3

## 2011-09-24 ENCOUNTER — Encounter: Payer: Self-pay | Admitting: *Deleted

## 2011-09-27 ENCOUNTER — Encounter (HOSPITAL_BASED_OUTPATIENT_CLINIC_OR_DEPARTMENT_OTHER): Payer: BC Managed Care – PPO | Admitting: Oncology

## 2011-09-27 DIAGNOSIS — C7A012 Malignant carcinoid tumor of the ileum: Secondary | ICD-10-CM

## 2011-09-27 DIAGNOSIS — R197 Diarrhea, unspecified: Secondary | ICD-10-CM

## 2011-10-09 ENCOUNTER — Other Ambulatory Visit: Payer: Self-pay | Admitting: Oncology

## 2011-10-09 DIAGNOSIS — D3A098 Benign carcinoid tumors of other sites: Secondary | ICD-10-CM

## 2011-10-09 MED ORDER — OCTREOTIDE ACETATE 20 MG IM KIT: 20.0000 mg | PACK | Freq: Once | INTRAMUSCULAR | Status: DC

## 2011-10-26 ENCOUNTER — Ambulatory Visit (HOSPITAL_BASED_OUTPATIENT_CLINIC_OR_DEPARTMENT_OTHER): Payer: Medicare Other | Admitting: Oncology

## 2011-10-26 VITALS — BP 161/92 | HR 65 | Ht 63.5 in | Wt 159.9 lb

## 2011-10-26 DIAGNOSIS — R197 Diarrhea, unspecified: Secondary | ICD-10-CM

## 2011-10-26 DIAGNOSIS — D3A098 Benign carcinoid tumors of other sites: Secondary | ICD-10-CM

## 2011-10-26 DIAGNOSIS — C7A012 Malignant carcinoid tumor of the ileum: Secondary | ICD-10-CM

## 2011-10-26 DIAGNOSIS — Z23 Encounter for immunization: Secondary | ICD-10-CM

## 2011-10-26 MED ORDER — INFLUENZA VIRUS VACC SPLIT PF IM SUSP
0.5000 mL | Freq: Once | INTRAMUSCULAR | Status: AC
  Administered 2011-10-26: 0.5 mL via INTRAMUSCULAR
  Filled 2011-10-26: qty 0.5

## 2011-10-26 MED ORDER — OCTREOTIDE ACETATE 20 MG IM KIT
20.0000 mg | PACK | Freq: Once | INTRAMUSCULAR | Status: AC
  Administered 2011-10-26: 20 mg via INTRAMUSCULAR
  Filled 2011-10-26: qty 1

## 2011-10-26 NOTE — Progress Notes (Signed)
OFFICE PROGRESS NOTE   INTERVAL HISTORY:   Lori Dennis returns as scheduled. She reports feeling well. She has an excellent energy level.  No pain or nausea.  Decreased stool frequency after Sandostatin.   Vi tal signs she in last 24 hours :   Blood pressure 161/92, pulse 65, height 5' 3.5" (1.613 m), weight 159 lb 14.4 oz (72.53 kg).   HEENT:neck without mass. Resp: clear to auscultation bilaterally Cardio: regular rate and rhythm GI: no mass or ascites, nontender, no HSM Extremities: extremities normal, atraumatic, no cyanosis or edema    Medications: I have reviewed the patient's current medications.  Assessment/Plan:  1. Metastatic low grade carcinoid tumor, s/p 6 cycles of xeloda/temozolamide beginnning 09/06/2010.  -The chromagranin A and 24 hour Urine 5-HIAA were not significantly changed on 07/26/2011.  2. Admission with SBO/SB infarction 06/08/2010, s/p resection of small bowel and colon.  3. S/p sigmoid colectomy/colostomy, right colectomy, and gastrojejunostomy in April 2009.  4. Elevated pre-operative 24-hour urine 5-HIAA.  5.  Acute N/V April 2011, likely related to a partial small bowel obstruction due to tumor.  Symptoms resolved with bowel rest and a liquid diet.  6.  Intermittent loose stools-? Secondary to bowel resection or carcinoid.  Partially improved with Sandostatin LAR.  7. Disposition:  Lori Dennis appears stable.  The plan is to continue monthly Sandostatin.  She will return for an office visit in 3 months.        Lori Dennis 10/26/2011, 2:12 PM

## 2011-11-05 ENCOUNTER — Encounter (INDEPENDENT_AMBULATORY_CARE_PROVIDER_SITE_OTHER): Payer: Self-pay | Admitting: General Surgery

## 2011-11-24 ENCOUNTER — Ambulatory Visit (HOSPITAL_BASED_OUTPATIENT_CLINIC_OR_DEPARTMENT_OTHER): Payer: Medicare Other

## 2011-11-24 ENCOUNTER — Other Ambulatory Visit: Payer: Medicare Other | Admitting: Lab

## 2011-11-24 VITALS — BP 152/90 | HR 69 | Temp 97.0°F

## 2011-11-24 DIAGNOSIS — D3A098 Benign carcinoid tumors of other sites: Secondary | ICD-10-CM

## 2011-11-24 DIAGNOSIS — D490 Neoplasm of unspecified behavior of digestive system: Secondary | ICD-10-CM

## 2011-11-24 MED ORDER — OCTREOTIDE ACETATE 20 MG IM KIT
20.0000 mg | PACK | Freq: Once | INTRAMUSCULAR | Status: AC
  Administered 2011-11-24: 20 mg via INTRAMUSCULAR
  Filled 2011-11-24: qty 1

## 2011-12-22 ENCOUNTER — Ambulatory Visit (HOSPITAL_BASED_OUTPATIENT_CLINIC_OR_DEPARTMENT_OTHER): Payer: Medicare Other

## 2011-12-22 VITALS — BP 161/80 | HR 75 | Temp 97.6°F

## 2011-12-22 DIAGNOSIS — C7A012 Malignant carcinoid tumor of the ileum: Secondary | ICD-10-CM

## 2011-12-22 DIAGNOSIS — D3A098 Benign carcinoid tumors of other sites: Secondary | ICD-10-CM

## 2011-12-22 MED ORDER — OCTREOTIDE ACETATE 20 MG IM KIT
20.0000 mg | PACK | Freq: Once | INTRAMUSCULAR | Status: AC
  Administered 2011-12-22: 20 mg via INTRAMUSCULAR
  Filled 2011-12-22: qty 1

## 2012-01-05 ENCOUNTER — Other Ambulatory Visit: Payer: Self-pay | Admitting: *Deleted

## 2012-01-19 ENCOUNTER — Telehealth: Payer: Self-pay | Admitting: Oncology

## 2012-01-19 NOTE — Telephone Encounter (Signed)
called pt and r/s appt on 03/01 to 02/27 per MD

## 2012-01-20 ENCOUNTER — Other Ambulatory Visit: Payer: Medicare Other | Admitting: Lab

## 2012-01-20 ENCOUNTER — Ambulatory Visit: Payer: Medicare Other | Admitting: Oncology

## 2012-01-21 ENCOUNTER — Ambulatory Visit (HOSPITAL_BASED_OUTPATIENT_CLINIC_OR_DEPARTMENT_OTHER): Payer: Medicare Other

## 2012-01-21 VITALS — BP 160/86 | HR 88 | Temp 97.5°F

## 2012-01-21 DIAGNOSIS — D3A098 Benign carcinoid tumors of other sites: Secondary | ICD-10-CM

## 2012-01-21 DIAGNOSIS — D490 Neoplasm of unspecified behavior of digestive system: Secondary | ICD-10-CM

## 2012-01-21 DIAGNOSIS — Z5111 Encounter for antineoplastic chemotherapy: Secondary | ICD-10-CM

## 2012-01-21 MED ORDER — OCTREOTIDE ACETATE 20 MG IM KIT
20.0000 mg | PACK | Freq: Once | INTRAMUSCULAR | Status: AC
  Administered 2012-01-21: 20 mg via INTRAMUSCULAR
  Filled 2012-01-21: qty 1

## 2012-02-16 ENCOUNTER — Ambulatory Visit (HOSPITAL_BASED_OUTPATIENT_CLINIC_OR_DEPARTMENT_OTHER): Payer: Medicare Other | Admitting: Oncology

## 2012-02-16 VITALS — BP 130/75 | HR 81 | Temp 98.3°F | Ht 63.5 in | Wt 160.2 lb

## 2012-02-16 DIAGNOSIS — D490 Neoplasm of unspecified behavior of digestive system: Secondary | ICD-10-CM

## 2012-02-16 DIAGNOSIS — D3A098 Benign carcinoid tumors of other sites: Secondary | ICD-10-CM

## 2012-02-16 MED ORDER — OCTREOTIDE ACETATE 20 MG IM KIT
20.0000 mg | PACK | Freq: Once | INTRAMUSCULAR | Status: AC
  Administered 2012-02-16: 20 mg via INTRAMUSCULAR
  Filled 2012-02-16: qty 1

## 2012-02-16 NOTE — Progress Notes (Signed)
OFFICE PROGRESS NOTE   INTERVAL HISTORY:   She returns as scheduled. She continues monthly Sandostatin. She has frequent stool in the ostomy bag. No flushing. She has a good appetite and energy level. No abdominal pain, bloating, or nausea.  Objective:  Vital signs in last 24 hours:  Blood pressure 130/75, pulse 81, temperature 98.3 F (36.8 C), temperature source Oral, height 5' 3.5" (1.613 m), weight 160 lb 3.2 oz (72.666 kg).    HEENT: Neck without mass Resp: Lungs clear bilaterally Cardio: Regular rate and rhythm GI: No hepatomegaly, left lower quadrant ostomy bag with a small amount of liquid stool, no mass, nontender, no apparent ascites Vascular: No leg edema      Medications: I have reviewed the patient's current medications.  Assessment/Plan: 1.Metastatic low grade carcinoid tumor, s/p 6 cycles of xeloda/temozolamide beginnning 09/06/2010.  -The chromagranin A and 24 hour Urine 5-HIAA were not significantly changed on 07/26/2011.   2. Admission with SBO/SB infarction 06/08/2010, s/p resection of small bowel and colon.   3. S/p sigmoid colectomy/colostomy, right colectomy, and gastrojejunostomy in April 2009.   4. Elevated pre-operative 24-hour urine 5-HIAA.   5. Acute N/V April 2011, likely related to a partial small bowel obstruction due to tumor. Symptoms resolved with bowel rest and a liquid diet.   6. Intermittent loose stools-? Secondary to bowel resection or carcinoid. Partially improved with Sandostatin LAR.    Disposition:  She appears stable. The plan is to continue monthly Sandostatin. She will return for an office visit in 4 months. We will obtain a baseline 24-hour urine 5 HIAA and chromogranin A level in the future if there is a plan for salvage therapy.   Lucile Shutters, MD  02/16/2012  1:31 PM

## 2012-02-18 ENCOUNTER — Ambulatory Visit: Payer: Medicare Other | Admitting: Oncology

## 2012-03-15 ENCOUNTER — Ambulatory Visit (HOSPITAL_BASED_OUTPATIENT_CLINIC_OR_DEPARTMENT_OTHER): Payer: Medicare Other

## 2012-03-15 VITALS — BP 179/89 | HR 75 | Temp 97.1°F

## 2012-03-15 DIAGNOSIS — D3A098 Benign carcinoid tumors of other sites: Secondary | ICD-10-CM

## 2012-03-15 DIAGNOSIS — D490 Neoplasm of unspecified behavior of digestive system: Secondary | ICD-10-CM

## 2012-03-15 MED ORDER — OCTREOTIDE ACETATE 20 MG IM KIT
20.0000 mg | PACK | Freq: Once | INTRAMUSCULAR | Status: AC
  Administered 2012-03-15: 20 mg via INTRAMUSCULAR
  Filled 2012-03-15: qty 1

## 2012-04-12 ENCOUNTER — Ambulatory Visit (HOSPITAL_BASED_OUTPATIENT_CLINIC_OR_DEPARTMENT_OTHER): Payer: Medicare Other

## 2012-04-12 VITALS — BP 164/86 | HR 75 | Temp 97.4°F

## 2012-04-12 DIAGNOSIS — C7A098 Malignant carcinoid tumors of other sites: Secondary | ICD-10-CM

## 2012-04-12 DIAGNOSIS — D3A098 Benign carcinoid tumors of other sites: Secondary | ICD-10-CM

## 2012-04-12 MED ORDER — OCTREOTIDE ACETATE 20 MG IM KIT
20.0000 mg | PACK | Freq: Once | INTRAMUSCULAR | Status: AC
  Administered 2012-04-12: 20 mg via INTRAMUSCULAR
  Filled 2012-04-12: qty 1

## 2012-05-10 ENCOUNTER — Ambulatory Visit (HOSPITAL_BASED_OUTPATIENT_CLINIC_OR_DEPARTMENT_OTHER): Payer: Medicare Other

## 2012-05-10 VITALS — BP 163/88 | HR 72 | Temp 97.2°F

## 2012-05-10 DIAGNOSIS — C7A012 Malignant carcinoid tumor of the ileum: Secondary | ICD-10-CM

## 2012-05-10 DIAGNOSIS — D3A098 Benign carcinoid tumors of other sites: Secondary | ICD-10-CM

## 2012-05-10 MED ORDER — OCTREOTIDE ACETATE 20 MG IM KIT
20.0000 mg | PACK | Freq: Once | INTRAMUSCULAR | Status: AC
  Administered 2012-05-10: 20 mg via INTRAMUSCULAR
  Filled 2012-05-10: qty 1

## 2012-06-07 ENCOUNTER — Ambulatory Visit (HOSPITAL_BASED_OUTPATIENT_CLINIC_OR_DEPARTMENT_OTHER): Payer: Medicare Other | Admitting: Oncology

## 2012-06-07 ENCOUNTER — Other Ambulatory Visit: Payer: Self-pay

## 2012-06-07 ENCOUNTER — Telehealth: Payer: Self-pay | Admitting: Oncology

## 2012-06-07 VITALS — BP 152/94 | HR 82 | Temp 97.5°F | Ht 63.5 in | Wt 164.0 lb

## 2012-06-07 DIAGNOSIS — D3A098 Benign carcinoid tumors of other sites: Secondary | ICD-10-CM

## 2012-06-07 DIAGNOSIS — R197 Diarrhea, unspecified: Secondary | ICD-10-CM

## 2012-06-07 DIAGNOSIS — C7A012 Malignant carcinoid tumor of the ileum: Secondary | ICD-10-CM

## 2012-06-07 MED ORDER — OCTREOTIDE ACETATE 20 MG IM KIT
20.0000 mg | PACK | Freq: Once | INTRAMUSCULAR | Status: AC
  Administered 2012-06-07: 20 mg via INTRAMUSCULAR
  Filled 2012-06-07: qty 1

## 2012-06-07 MED ORDER — PROMETHAZINE HCL 12.5 MG PO TABS: ORAL_TABLET | ORAL | Status: DC

## 2012-06-07 NOTE — Progress Notes (Signed)
   [facility]    OFFICE PROGRESS NOTE   INTERVAL HISTORY:   She returns as scheduled. She continues monthly Sandostatin. Stable intermittent loose stool from the ostomy bag. No fever or flushing.  She reports several episodes of nausea and right buttock pain over the past few months. These symptoms resolved spontaneously over a few minutes. No difficulty with urination, back pain, or leg weakness/numbness.  Objective:  Vital signs in last 24 hours:  Blood pressure 152/94, pulse 82, temperature 97.5 F (36.4 C), temperature source Oral, height 5' 3.5" (1.613 m), weight 164 lb (74.39 kg).    Resp: Lungs clear bilaterally Cardio: Regular rate and rhythm GI: No hepatomegaly, no apparent ascites, nontender, left lower quadrant ostomy, no discrete mass Vascular: No leg edema  Skin: No rash over the back or right buttock/trochanter area Musculoskeletal: Full range of motion of the right hip without pain. No tenderness over the iliac, a skin, or trochanter     Medications: I have reviewed the patient's current medications.  Assessment/Plan: 1.Metastatic low grade carcinoid tumor, s/p 6 cycles of xeloda/temozolamide beginnning 09/06/2010.  -The chromagranin A and 24 hour Urine 5-HIAA were not significantly changed on 07/26/2011.  2. Admission with SBO/SB infarction 06/08/2010, s/p resection of small bowel and colon.  3. S/p sigmoid colectomy/colostomy, right colectomy, and gastrojejunostomy in April 2009.  4. Elevated pre-operative 24-hour urine 5-HIAA.  5. Acute N/V April 2011, likely related to a partial small bowel obstruction due to tumor. Symptoms resolved with bowel rest and a liquid diet.  6. Intermittent loose stools-? Secondary to bowel resection or carcinoid. Partially improved with Sandostatin LAR.  7. Intermittent episodes of nausea and right buttock pain, resolving spontaneously-? Related to carcinomatosis  Disposition:  She appears stable. The plan is to  continue monthly Sandostatin. She will contact us if the nausea becomes more frequent or she develops new symptoms.  She will return for an office visit in 3 months.   Thornton Papas, MD  06/07/2012  9:37 AM

## 2012-06-07 NOTE — Telephone Encounter (Signed)
gV PT APPTS FOR JULY-SEPT2013

## 2012-06-16 ENCOUNTER — Telehealth: Payer: Self-pay | Admitting: *Deleted

## 2012-06-16 NOTE — Telephone Encounter (Signed)
Nausea w/occasional vomiting and intermittent right buttock pain persists since last office visit. MD instructed her to call if it persisted.

## 2012-06-19 ENCOUNTER — Telehealth: Payer: Self-pay | Admitting: *Deleted

## 2012-06-19 ENCOUNTER — Telehealth: Payer: Self-pay | Admitting: Oncology

## 2012-06-19 ENCOUNTER — Other Ambulatory Visit: Payer: Self-pay | Admitting: *Deleted

## 2012-06-19 DIAGNOSIS — D3A098 Benign carcinoid tumors of other sites: Secondary | ICD-10-CM

## 2012-06-19 NOTE — Telephone Encounter (Signed)
S/w pt today re lb/ct 7/8 @ [facility] and f/u w/BS 7/17. Pt given appt d/t/location/instruction. Pt will arrive @ [facility] rad 2hrs early to drink water based prep.

## 2012-06-19 NOTE — Telephone Encounter (Signed)
Per Dr. Truett Perna: Needs CT abd/pelvis with contrast prior to next injection. Office visit 30 minutes with next injection (7/17). Patient notified of MD orders and that scheduler will call her with appointments.

## 2012-06-21 ENCOUNTER — Telehealth: Payer: Self-pay | Admitting: Oncology

## 2012-06-21 NOTE — Telephone Encounter (Signed)
On call: daughter calling due to abdominal pain intermittently thru day, not always same location, but more severe in past 2 hours and some nausea. They should go to ED if pain is significant and continuing. Ila Mcgill, MD

## 2012-06-23 ENCOUNTER — Telehealth: Payer: Self-pay | Admitting: *Deleted

## 2012-06-23 NOTE — Telephone Encounter (Signed)
Reports patient having more frequent episodes of abdominal pain and nausea. Still eating and passing stool. Currently responds to her pain and nausea med. Just wanted MD aware. Confirmed that MD is aware and if the pain worsens or does not respond to med or she starts vomiting, she needs to go to emergency room. Will proceed with CT scan on Monday as scheduled, RN will look for results on Monday and ensure MD sees them as soon as available and they will be advised as to results and need for follow up.

## 2012-06-26 ENCOUNTER — Other Ambulatory Visit (HOSPITAL_BASED_OUTPATIENT_CLINIC_OR_DEPARTMENT_OTHER): Payer: Medicare Other

## 2012-06-26 ENCOUNTER — Ambulatory Visit (HOSPITAL_COMMUNITY)
Admission: RE | Admit: 2012-06-26 | Discharge: 2012-06-26 | Disposition: A | Discharge status: Home / Self Care | Payer: Medicare Other | Source: Ambulatory Visit | Attending: Oncology | Admitting: Oncology

## 2012-06-26 DIAGNOSIS — R109 Unspecified abdominal pain: Secondary | ICD-10-CM | POA: Insufficient documentation

## 2012-06-26 DIAGNOSIS — D3A098 Benign carcinoid tumors of other sites: Secondary | ICD-10-CM

## 2012-06-26 DIAGNOSIS — C7B8 Other secondary neuroendocrine tumors: Secondary | ICD-10-CM | POA: Insufficient documentation

## 2012-06-26 DIAGNOSIS — R599 Enlarged lymph nodes, unspecified: Secondary | ICD-10-CM | POA: Insufficient documentation

## 2012-06-26 DIAGNOSIS — C787 Secondary malignant neoplasm of liver and intrahepatic bile duct: Secondary | ICD-10-CM | POA: Insufficient documentation

## 2012-06-26 DIAGNOSIS — C7A029 Malignant carcinoid tumor of the large intestine, unspecified portion: Secondary | ICD-10-CM | POA: Insufficient documentation

## 2012-06-26 DIAGNOSIS — C7A012 Malignant carcinoid tumor of the ileum: Secondary | ICD-10-CM

## 2012-06-26 DIAGNOSIS — R1909 Other intra-abdominal and pelvic swelling, mass and lump: Secondary | ICD-10-CM | POA: Insufficient documentation

## 2012-06-26 LAB — BASIC METABOLIC PANEL - CANCER CENTER ONLY
Chloride: 97 mEq/L — ABNORMAL LOW (ref 98–108)
Potassium: 5.1 mEq/L — ABNORMAL HIGH (ref 3.3–4.7)

## 2012-06-26 MED ORDER — IOHEXOL 300 MG/ML  SOLN
100.0000 mL | Freq: Once | INTRAMUSCULAR | Status: AC | PRN
  Administered 2012-06-26: 100 mL via INTRAVENOUS

## 2012-06-28 ENCOUNTER — Telehealth: Payer: Self-pay | Admitting: *Deleted

## 2012-06-28 NOTE — Telephone Encounter (Signed)
Called pt with CT results. Per Dr. Truett Perna: peritoneal nodules are stable. There is an enlarged pelvis soft tissue lesion. Dilated small bowel - no clear obstruction noted. Overall scan is stable compared to 2011. Pt voiced understanding. Thankful for call. Bowels are moving. Nausea and pain is intermittent. She reports her PCP diagnosed her with shingles.

## 2012-07-04 ENCOUNTER — Other Ambulatory Visit: Payer: Self-pay | Admitting: *Deleted

## 2012-07-04 DIAGNOSIS — D3A098 Benign carcinoid tumors of other sites: Secondary | ICD-10-CM

## 2012-07-05 ENCOUNTER — Other Ambulatory Visit (HOSPITAL_BASED_OUTPATIENT_CLINIC_OR_DEPARTMENT_OTHER): Payer: Medicare Other | Admitting: Lab

## 2012-07-05 ENCOUNTER — Ambulatory Visit: Payer: Medicare Other

## 2012-07-05 ENCOUNTER — Ambulatory Visit (HOSPITAL_BASED_OUTPATIENT_CLINIC_OR_DEPARTMENT_OTHER): Payer: Medicare Other | Admitting: Oncology

## 2012-07-05 VITALS — BP 158/90 | HR 81 | Temp 98.1°F | Ht 63.5 in | Wt 165.9 lb

## 2012-07-05 DIAGNOSIS — C7A012 Malignant carcinoid tumor of the ileum: Secondary | ICD-10-CM

## 2012-07-05 DIAGNOSIS — B029 Zoster without complications: Secondary | ICD-10-CM

## 2012-07-05 DIAGNOSIS — D3A098 Benign carcinoid tumors of other sites: Secondary | ICD-10-CM

## 2012-07-05 LAB — BASIC METABOLIC PANEL
BUN: 18 mg/dL (ref 6–23)
CO2: 24 mEq/L (ref 19–32)
Chloride: 100 mEq/L (ref 96–112)
Glucose, Bld: 167 mg/dL — ABNORMAL HIGH (ref 70–99)
Potassium: 4.5 mEq/L (ref 3.5–5.3)

## 2012-07-05 MED ORDER — OCTREOTIDE ACETATE 20 MG IM KIT
20.0000 mg | PACK | Freq: Once | INTRAMUSCULAR | Status: AC
  Administered 2012-07-05: 20 mg via INTRAMUSCULAR
  Filled 2012-07-05: qty 1

## 2012-07-05 NOTE — Progress Notes (Signed)
   [facility]    OFFICE PROGRESS NOTE   INTERVAL HISTORY:   She returns as scheduled. She was diagnosed with a zoster rash at the right for head and scalp last week. She completed a course of antiviral therapy. The rash is resolving. Several days prior to developing the rash she had an episode of diffuse abdominal pain. This occurred in the evening and was gone the next morning. She reports abdominal soreness the next day. There was no associated nausea or constipation.  There have been no further episodes of nausea and right buttock pain. She feels well. No complaint today. No fever or flushing. No change in the ostomy output.  Objective:  Vital signs in last 24 hours:  Blood pressure 158/90, pulse 81, temperature 98.1 F (36.7 C), temperature source Oral, height 5' 3.5" (1.613 m), weight 165 lb 14.4 oz (75.252 kg).   Resp: Lungs clear bilaterally  Cardio: Regular rate and rhythm, 1/6 systolic murmur  GI: No hepatomegaly, no apparent ascites, nontender, fullness in the low abdomen bilaterally without a discrete mass. Green liquid stool in the ostomy bag Vascular: no leg edema   Skin: resolving zoster rash at the right for head and temple area     X-rays: CT the abdomen and pelvis on 06/26/2012 was compared to scans from March and June of 2011. Stable hyper attenuating lesions along the liver capsule. Stable right hepatic lesion measures 8 mm. Mild prominence of contrast filled small bowel. No definite transition point in the small bowel. A soft tissue mass in the right pelvis measures 6 x 7.9 cm compared to 4.2 x 5.2 cm. Soft tissue/fat density in the left adnexa measures 4.7 x 2.7 cm. Stable peritoneal soft tissue nodule centrally. Numerous subcentimeter mesenteric lymph nodes. Omental nodularity measures 1.3 x 1.5 cm deep to the left rectus abdominal muscle. Omental disease may be less prominent than on the 02/25/2010 study.    Medications: I have reviewed the  patient's current medications.  Assessment/Plan: 1.Metastatic low grade carcinoid tumor, s/p 6 cycles of xeloda/temozolamide beginnning 09/06/2010.  -The chromagranin A and 24 hour Urine 5-HIAA were not significantly changed on 07/26/2011.                -Restaging CT 06/26/2012 with overall stable disease compared to a CT from June of 2011 2. Admission with SBO/SB infarction 06/08/2010, s/p resection of small bowel and colon.  3. S/p sigmoid colectomy/colostomy, right colectomy, and gastrojejunostomy in April 2009.  4. Elevated pre-operative 24-hour urine 5-HIAA.  5. Acute N/V April 2011, likely related to a partial small bowel obstruction due to tumor. Symptoms resolved with bowel rest and a liquid diet.  6. Intermittent loose stools-? Secondary to bowel resection or carcinoid. Partially improved with Sandostatin LAR.  7. Intermittent episodes of nausea and right buttock pain, resolving spontaneously-? Related to carcinomatosis 8. Right scalp herpes zoster rash July 2013   Disposition:   Her overall status is stable. The restaging CT does not reveal x-ray evidence of significant disease progression over a 2 year interval. I am concerned the intermittent episodes of nausea and abdominal pain are related to carcinomatosis and partial obstruction. The plan is to continue an observation approach and monthly Sandostatin. She will contact us if she develops persistent nausea or abdominal pain. She will return for an office visit in 2 months.   Thornton Papas, MD  07/05/2012  11:25 AM

## 2012-07-06 ENCOUNTER — Telehealth: Payer: Self-pay | Admitting: *Deleted

## 2012-07-06 NOTE — Telephone Encounter (Signed)
left voice message to inform the patient of the new date and time on 08-03-2012 starting at 10:00am

## 2012-07-07 ENCOUNTER — Telehealth: Payer: Self-pay | Admitting: *Deleted

## 2012-07-07 NOTE — Telephone Encounter (Signed)
patient requested to move appointment to 08-04-2012 starting at 10:30 am

## 2012-08-02 ENCOUNTER — Ambulatory Visit: Payer: Medicare Other

## 2012-08-03 ENCOUNTER — Ambulatory Visit: Payer: Medicare Other | Admitting: Oncology

## 2012-08-03 ENCOUNTER — Ambulatory Visit: Payer: Medicare Other

## 2012-08-04 ENCOUNTER — Telehealth: Payer: Self-pay | Admitting: Oncology

## 2012-08-04 ENCOUNTER — Ambulatory Visit (HOSPITAL_BASED_OUTPATIENT_CLINIC_OR_DEPARTMENT_OTHER): Payer: Medicare Other

## 2012-08-04 ENCOUNTER — Ambulatory Visit (HOSPITAL_BASED_OUTPATIENT_CLINIC_OR_DEPARTMENT_OTHER): Payer: Medicare Other | Admitting: Oncology

## 2012-08-04 VITALS — BP 151/82 | HR 80 | Temp 97.0°F | Resp 18 | Ht 63.5 in | Wt 164.0 lb

## 2012-08-04 DIAGNOSIS — D3A098 Benign carcinoid tumors of other sites: Secondary | ICD-10-CM

## 2012-08-04 DIAGNOSIS — C787 Secondary malignant neoplasm of liver and intrahepatic bile duct: Secondary | ICD-10-CM

## 2012-08-04 DIAGNOSIS — B029 Zoster without complications: Secondary | ICD-10-CM

## 2012-08-04 DIAGNOSIS — C7A012 Malignant carcinoid tumor of the ileum: Secondary | ICD-10-CM

## 2012-08-04 DIAGNOSIS — R110 Nausea: Secondary | ICD-10-CM

## 2012-08-04 MED ORDER — OCTREOTIDE ACETATE 20 MG IM KIT
20.0000 mg | PACK | Freq: Once | INTRAMUSCULAR | Status: AC
  Administered 2012-08-04: 20 mg via INTRAMUSCULAR

## 2012-08-04 NOTE — Progress Notes (Signed)
   [facility]    OFFICE PROGRESS NOTE   INTERVAL HISTORY:   She returns as scheduled. 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 She had a large area of skin abrasion and swelling in the left lower leg and foot. The skin is healing and the swelling has improved.  No recurrent abdominal pain. She had one episode of nausea after eating a heavy meal. Nausea has not been a consistent problem. She continues to empty the ostomy bag approximately 8 times per day.  Objective:  Vital signs in last 24 hours:  Blood pressure 151/82, pulse 80, temperature 97 F (36.1 C), temperature source Oral, resp. rate 18, height 5' 3.5" (1.613 m), weight 164 lb (74.39 kg).   Resp: Lungs clear bilaterally Cardio: Regular rate and rhythm GI: No hepatomegaly, no apparent ascites, no discrete mass, left lower quadrant ostomy with liquid brown stool Vascular: Trace edema at the left lower leg and foot.  Skin: Large area of skin abrasion at the left pretibial region that appears to be healing    Medications: I have reviewed the patient's current medications.  Assessment/Plan: 1.Metastatic low grade carcinoid tumor, s/p 6 cycles of xeloda/temozolamide beginnning 09/06/2010.  -The chromagranin A and 24 hour Urine 5-HIAA were not significantly changed on 07/26/2011.  -Restaging CT 06/26/2012 with overall stable disease compared to a CT from June of 2011   2. Admission with SBO/SB infarction 06/08/2010, s/p resection of small bowel and colon.  3. S/p sigmoid colectomy/colostomy, right colectomy, and gastrojejunostomy in April 2009.  4. Elevated pre-operative 24-hour urine 5-HIAA.  5. Acute N/V April 2011, likely related to a partial small bowel obstruction due to tumor. Symptoms resolved with bowel rest and a liquid diet.  6. Intermittent loose stools-? Secondary to bowel resection or carcinoid. Partially improved with Sandostatin LAR.  7. Intermittent  episodes of nausea and right buttock pain, resolving spontaneously-? Related to carcinomatosis  8. Right scalp herpes zoster rash July 2013    Disposition:  There is no clinical evidence for progression of the metastatic carcinoid tumor. She will continue monthly Sandostatin. Lori Dennis will be scheduled for a 3 month office visit. She has been evaluated by Dr. Valentina Dennis for the left leg injury.   Lori Papas, MD  08/04/2012  11:11 AM

## 2012-08-04 NOTE — Telephone Encounter (Signed)
appts made and printed for pt aom °

## 2012-09-01 ENCOUNTER — Ambulatory Visit: Payer: Medicare Other | Admitting: Oncology

## 2012-09-01 ENCOUNTER — Ambulatory Visit (HOSPITAL_BASED_OUTPATIENT_CLINIC_OR_DEPARTMENT_OTHER): Payer: Medicare Other

## 2012-09-01 ENCOUNTER — Ambulatory Visit: Payer: Medicare Other

## 2012-09-01 VITALS — BP 134/82 | HR 79 | Temp 98.0°F

## 2012-09-01 DIAGNOSIS — C787 Secondary malignant neoplasm of liver and intrahepatic bile duct: Secondary | ICD-10-CM

## 2012-09-01 DIAGNOSIS — C7A012 Malignant carcinoid tumor of the ileum: Secondary | ICD-10-CM

## 2012-09-01 DIAGNOSIS — D3A098 Benign carcinoid tumors of other sites: Secondary | ICD-10-CM

## 2012-09-01 MED ORDER — OCTREOTIDE ACETATE 20 MG IM KIT
20.0000 mg | PACK | Freq: Once | INTRAMUSCULAR | Status: AC
  Administered 2012-09-01: 20 mg via INTRAMUSCULAR
  Filled 2012-09-01: qty 1

## 2012-09-29 ENCOUNTER — Ambulatory Visit (HOSPITAL_BASED_OUTPATIENT_CLINIC_OR_DEPARTMENT_OTHER): Payer: Medicare Other

## 2012-09-29 ENCOUNTER — Telehealth: Payer: Self-pay | Admitting: Oncology

## 2012-09-29 VITALS — BP 157/83 | HR 83 | Temp 97.8°F

## 2012-09-29 DIAGNOSIS — D3A098 Benign carcinoid tumors of other sites: Secondary | ICD-10-CM

## 2012-09-29 DIAGNOSIS — C787 Secondary malignant neoplasm of liver and intrahepatic bile duct: Secondary | ICD-10-CM

## 2012-09-29 DIAGNOSIS — C7A012 Malignant carcinoid tumor of the ileum: Secondary | ICD-10-CM

## 2012-09-29 MED ORDER — OCTREOTIDE ACETATE 20 MG IM KIT
20.0000 mg | PACK | Freq: Once | INTRAMUSCULAR | Status: AC
  Administered 2012-09-29: 20 mg via INTRAMUSCULAR
  Filled 2012-09-29: qty 1

## 2012-09-29 NOTE — Telephone Encounter (Signed)
Pt needed to changed appt schedule to Nov 15

## 2012-10-10 ENCOUNTER — Telehealth: Payer: Self-pay | Admitting: *Deleted

## 2012-10-10 NOTE — Telephone Encounter (Signed)
Had nausea over weekend and vomited X 2 with some blood in emesis. Nausea is resolved today and she is feeling better. Reports her stool is still "green" in color. Just wanted MD aware of the weekend episode.

## 2012-10-11 ENCOUNTER — Telehealth: Payer: Self-pay | Admitting: *Deleted

## 2012-10-11 NOTE — Telephone Encounter (Signed)
Per Dr. Truett Perna: Call back for recurrent symptoms. She reports feeling better, but says "my stomach just isn't right". No further emesis, stools still green. Eating bland, soft diet. She will call if condition worsens.

## 2012-10-27 ENCOUNTER — Ambulatory Visit: Payer: Medicare Other

## 2012-10-27 ENCOUNTER — Ambulatory Visit: Payer: Medicare Other | Admitting: Oncology

## 2012-11-01 ENCOUNTER — Other Ambulatory Visit: Payer: Self-pay | Admitting: Oncology

## 2012-11-03 ENCOUNTER — Telehealth: Payer: Self-pay | Admitting: Oncology

## 2012-11-03 ENCOUNTER — Ambulatory Visit (HOSPITAL_BASED_OUTPATIENT_CLINIC_OR_DEPARTMENT_OTHER): Payer: Medicare Other

## 2012-11-03 ENCOUNTER — Ambulatory Visit (HOSPITAL_BASED_OUTPATIENT_CLINIC_OR_DEPARTMENT_OTHER): Payer: Medicare Other | Admitting: Oncology

## 2012-11-03 ENCOUNTER — Ambulatory Visit (HOSPITAL_COMMUNITY)
Admission: RE | Admit: 2012-11-03 | Discharge: 2012-11-03 | Disposition: A | Discharge status: Home / Self Care | Payer: Medicare Other | Source: Ambulatory Visit | Attending: Oncology | Admitting: Oncology

## 2012-11-03 VITALS — BP 152/92 | HR 86 | Temp 97.4°F | Resp 20 | Ht 63.5 in | Wt 160.3 lb

## 2012-11-03 DIAGNOSIS — R112 Nausea with vomiting, unspecified: Secondary | ICD-10-CM

## 2012-11-03 DIAGNOSIS — C787 Secondary malignant neoplasm of liver and intrahepatic bile duct: Secondary | ICD-10-CM

## 2012-11-03 DIAGNOSIS — R197 Diarrhea, unspecified: Secondary | ICD-10-CM

## 2012-11-03 DIAGNOSIS — M79609 Pain in unspecified limb: Secondary | ICD-10-CM

## 2012-11-03 DIAGNOSIS — D3A098 Benign carcinoid tumors of other sites: Secondary | ICD-10-CM

## 2012-11-03 DIAGNOSIS — C7A012 Malignant carcinoid tumor of the ileum: Secondary | ICD-10-CM

## 2012-11-03 DIAGNOSIS — C7B8 Other secondary neuroendocrine tumors: Secondary | ICD-10-CM

## 2012-11-03 DIAGNOSIS — D490 Neoplasm of unspecified behavior of digestive system: Secondary | ICD-10-CM | POA: Insufficient documentation

## 2012-11-03 MED ORDER — OCTREOTIDE ACETATE 20 MG IM KIT
20.0000 mg | PACK | Freq: Once | INTRAMUSCULAR | Status: AC
  Administered 2012-11-03: 20 mg via INTRAMUSCULAR
  Filled 2012-11-03: qty 1

## 2012-11-03 NOTE — Progress Notes (Signed)
   [facility]    OFFICE PROGRESS NOTE   INTERVAL HISTORY:   She returns as scheduled. She continues monthly Sandostatin. She reports intermittent nausea and vomiting for 2 weeks last month. This began approximately 1 week after the last Sandostatin injection. She last had an episode of emesis 2 weeks ago. During this time there were 2 episodes of hematemesis. No black or maroon stool. She reports "muscle "soreness in the left arm and left thigh. She relates the arm soreness to lifting her granddaughter.  Good appetite and energy level. The bowels were functioning during the period of nausea/vomiting.  Objective:  Vital signs in last 24 hours:  Blood pressure 152/92, pulse 86, temperature 97.4 F (36.3 C), temperature source Oral, resp. rate 20, height 5' 3.5" (1.613 m), weight 160 lb 4.8 oz (72.712 kg).    HEENT: Neck without mass Resp: Lungs clear bilaterally Cardio: Regular rate and rhythm GI: The abdomen is firm throughout. No discrete mass. No hepatomegaly. Liquid stool in the left abdominal ostomy. Vascular: The left thigh and lower leg are slightly larger than the right side, no erythema. Tenderness at the medial aspect of the left thigh without a palpable cord.    Portacath/PICC-without erythema  Lab Results:  Lab Results  Component Value Date   WBC 5.4 02/24/2011   HGB 12.2 02/24/2011   HCT 35.1 02/24/2011   MCV 100.0 02/24/2011   PLT 184 02/24/2011      Medications: I have reviewed the patient's current medications.  Assessment/Plan:  1.Metastatic low grade carcinoid tumor, s/p 6 cycles of xeloda/temozolamide beginnning 09/06/2010.  -The chromagranin A and 24 hour Urine 5-HIAA were not significantly changed on 07/26/2011.  -Restaging CT 06/26/2012 with overall stable disease compared to a CT from June of 2011  2. Admission with SBO/SB infarction 06/08/2010, s/p resection of small bowel and colon.  3. S/p sigmoid colectomy/colostomy, right colectomy, and  gastrojejunostomy in April 2009.  4. Elevated pre-operative 24-hour urine 5-HIAA.  5. Acute N/V April 2011, likely related to a partial small bowel obstruction due to tumor. Symptoms resolved with bowel rest and a liquid diet.  6. Intermittent loose stools-? Secondary to bowel resection or carcinoid. Partially improved with Sandostatin LAR.  7. Intermittent episodes of nausea and right buttock pain, resolving spontaneously-? Related to carcinomatosis  8. Right scalp herpes zoster rash July 2013  10. Intermittent nausea and vomiting with 2 episodes of hematemesis ovary past one month. Likely related to intermittent obstruction.     Disposition:  The plan is to continue monthly Sandostatin. Ms. Polega noted contact us for recurrent hematemesis or nausea/vomiting. I suspect she has intermittent bowel obstruction. She will begin a liquid diet if she develops nausea/vomiting.  Ms. Litterio will return for an office visit in 2 months. We will refer her for a Doppler of the left leg to rule out a deep vein thrombosis.   Thornton Papas, MD  11/03/2012  6:11 PM

## 2012-11-03 NOTE — Progress Notes (Signed)
VASCULAR LAB PRELIMINARY  PRELIMINARY  PRELIMINARY  PRELIMINARY  Left lower extremity venous duplex completed.    Preliminary report:  Left:  No evidence of DVT, superficial thrombosis, or Baker's cyst.  Avana Kreiser, RVS 11/03/2012, 12:31 PM

## 2012-11-03 NOTE — Telephone Encounter (Signed)
Schedule doppler with Lori Dennis for today @ 11:30am,..gv and printed appt schedule for pt for Dec and Jan

## 2012-11-28 ENCOUNTER — Telehealth: Payer: Self-pay | Admitting: *Deleted

## 2012-11-28 NOTE — Telephone Encounter (Signed)
Daughter left VM concerned about persistent nausea with some emesis on occasion. Burning/acid reflux. Asking what she can take for this? Attempted to call daughter back, but had to leave VM that return call was attempted, but I will call patient to discuss her GI issues. Attempted to reach patient-left VM for her to call back.

## 2012-11-29 ENCOUNTER — Telehealth: Payer: Self-pay | Admitting: *Deleted

## 2012-11-29 DIAGNOSIS — D3A098 Benign carcinoid tumors of other sites: Secondary | ICD-10-CM

## 2012-11-29 NOTE — Telephone Encounter (Signed)
Per Dr. Truett Perna: Start clear liquid diet and have acute abdomen done when she is here for her Sando injection Friday. Patient notified and agrees to plan.

## 2012-11-29 NOTE — Telephone Encounter (Signed)
Call from pt's daughter reporting she is concerned that her mom is not relaying her symptoms properly. Reports her oral intake is poor due to this nausea. States Ms. Dormer is having high output from her colostomy and is unable to tolerate fluids well enough to keep from being dehydrated. Also asking if there is something else she can have for nausea that will not make her as drowsy and if she should be seeing a GI MD. Lyla Son understands Dr. Truett Perna is out of the office and I will call her on 11/30/12.

## 2012-11-29 NOTE — Telephone Encounter (Signed)
Patient reports having episodes of extreme nausea every 2-3 days and she will vomit what seems like "acid". Phenergan is helpful, but makes her sleep. Stomach "griping" a lot and churns and very hyperactive sounding. She is still passing stools. Eating soft diet to liquids. Asking if there is anything else to help her be more comfortable or that should be investigated? Will be here on 12/13 at 0830 for SandoLar.

## 2012-12-01 ENCOUNTER — Ambulatory Visit (HOSPITAL_BASED_OUTPATIENT_CLINIC_OR_DEPARTMENT_OTHER): Payer: Medicare Other

## 2012-12-01 ENCOUNTER — Ambulatory Visit (HOSPITAL_COMMUNITY)
Admission: RE | Admit: 2012-12-01 | Discharge: 2012-12-01 | Disposition: A | Discharge status: Home / Self Care | Payer: Medicare Other | Source: Ambulatory Visit | Attending: Oncology | Admitting: Oncology

## 2012-12-01 VITALS — BP 163/87 | HR 78 | Temp 97.0°F

## 2012-12-01 DIAGNOSIS — D3A098 Benign carcinoid tumors of other sites: Secondary | ICD-10-CM

## 2012-12-01 DIAGNOSIS — R112 Nausea with vomiting, unspecified: Secondary | ICD-10-CM | POA: Insufficient documentation

## 2012-12-01 DIAGNOSIS — C787 Secondary malignant neoplasm of liver and intrahepatic bile duct: Secondary | ICD-10-CM

## 2012-12-01 DIAGNOSIS — R197 Diarrhea, unspecified: Secondary | ICD-10-CM

## 2012-12-01 DIAGNOSIS — C7A012 Malignant carcinoid tumor of the ileum: Secondary | ICD-10-CM

## 2012-12-01 DIAGNOSIS — C7B8 Other secondary neuroendocrine tumors: Secondary | ICD-10-CM

## 2012-12-01 MED ORDER — OCTREOTIDE ACETATE 20 MG IM KIT
20.0000 mg | PACK | Freq: Once | INTRAMUSCULAR | Status: AC
  Administered 2012-12-01: 20 mg via INTRAMUSCULAR
  Filled 2012-12-01: qty 1

## 2012-12-06 ENCOUNTER — Telehealth: Payer: Self-pay | Admitting: *Deleted

## 2012-12-06 NOTE — Telephone Encounter (Signed)
Left VM on home # that xray showed no obstruction. Call back if nausea persists.

## 2012-12-06 NOTE — Telephone Encounter (Signed)
Message copied by Wandalee Ferdinand on Wed Dec 06, 2012  9:47 AM ------      Message from: Ladene Artist      Created: Sat Dec 02, 2012 10:17 AM       Please call patient, xray without evidence of obstruction, call for persistent nausea

## 2012-12-07 ENCOUNTER — Telehealth: Payer: Self-pay | Admitting: *Deleted

## 2012-12-07 MED ORDER — ONDANSETRON HCL 8 MG PO TABS: 8.0000 mg | ORAL_TABLET | Freq: Two times a day (BID) | ORAL | Status: DC | PRN

## 2012-12-07 NOTE — Telephone Encounter (Signed)
Pt daughter Leafy Half called; reports that pt vomited X2 last night 12/06/12, took a phenergan 12.5mg  and that "knocks her out"  Asked "is there anything else she can take---suggested from a friend Phenergan gel q 6 hours that you put on wrist so it doesn't make you loopy"  Also states that pt tries to eat small meals and is taking po fluids without problems.  Ostomy output is every 2-3 hours but states this is not new.  Note to Dr. Truett Perna  Pt's daughter called again stating she spoke with her mom and she has emptied ostomy bag 5 times already today and reports hands/feet are tingling---Should she get fluids?  Dr. Truett Perna made aware.  Called and spoke with [facility]---per Dr. Truett Perna starting pt on Zofran 8 mg every 12 hours as needed for nausea and instructed to push po fluids.  Instructed to start Zofran today and see if nausea improved over next 2 days; if no improvement to let office know.  Pt's daughter verbalized understanding and expressed appreciation for call back.

## 2012-12-11 ENCOUNTER — Other Ambulatory Visit (HOSPITAL_BASED_OUTPATIENT_CLINIC_OR_DEPARTMENT_OTHER): Payer: Medicare Other | Admitting: Lab

## 2012-12-11 ENCOUNTER — Telehealth: Payer: Self-pay | Admitting: *Deleted

## 2012-12-11 ENCOUNTER — Telehealth: Payer: Self-pay | Admitting: Oncology

## 2012-12-11 ENCOUNTER — Other Ambulatory Visit: Payer: Self-pay | Admitting: *Deleted

## 2012-12-11 ENCOUNTER — Ambulatory Visit (HOSPITAL_COMMUNITY)
Admission: RE | Admit: 2012-12-11 | Discharge: 2012-12-11 | Disposition: A | Discharge status: Home / Self Care | Payer: Medicare Other | Source: Ambulatory Visit | Attending: Oncology | Admitting: Oncology

## 2012-12-11 ENCOUNTER — Ambulatory Visit (HOSPITAL_BASED_OUTPATIENT_CLINIC_OR_DEPARTMENT_OTHER): Payer: Medicare Other

## 2012-12-11 ENCOUNTER — Ambulatory Visit (HOSPITAL_BASED_OUTPATIENT_CLINIC_OR_DEPARTMENT_OTHER): Payer: Medicare Other | Admitting: Oncology

## 2012-12-11 VITALS — BP 154/76 | HR 74 | Temp 97.8°F | Resp 18 | Ht 63.5 in | Wt 152.3 lb

## 2012-12-11 DIAGNOSIS — D490 Neoplasm of unspecified behavior of digestive system: Secondary | ICD-10-CM | POA: Insufficient documentation

## 2012-12-11 DIAGNOSIS — D3A098 Benign carcinoid tumors of other sites: Secondary | ICD-10-CM

## 2012-12-11 DIAGNOSIS — C787 Secondary malignant neoplasm of liver and intrahepatic bile duct: Secondary | ICD-10-CM

## 2012-12-11 DIAGNOSIS — C7A012 Malignant carcinoid tumor of the ileum: Secondary | ICD-10-CM

## 2012-12-11 DIAGNOSIS — R634 Abnormal weight loss: Secondary | ICD-10-CM

## 2012-12-11 DIAGNOSIS — C7A1 Malignant poorly differentiated neuroendocrine tumors: Secondary | ICD-10-CM

## 2012-12-11 DIAGNOSIS — R112 Nausea with vomiting, unspecified: Secondary | ICD-10-CM | POA: Insufficient documentation

## 2012-12-11 LAB — COMPREHENSIVE METABOLIC PANEL (CC13)
Albumin: 3.6 g/dL (ref 3.5–5.0)
Alkaline Phosphatase: 280 U/L — ABNORMAL HIGH (ref 40–150)
BUN: 13 mg/dL (ref 7.0–26.0)
Creatinine: 1 mg/dL (ref 0.6–1.1)
Glucose: 100 mg/dl — ABNORMAL HIGH (ref 70–99)
Total Bilirubin: 0.98 mg/dL (ref 0.20–1.20)

## 2012-12-11 MED ORDER — SODIUM CHLORIDE 0.9 % IV SOLN
Freq: Once | INTRAVENOUS | Status: AC
  Administered 2012-12-11: 13:00:00 via INTRAVENOUS

## 2012-12-11 MED ORDER — METOCLOPRAMIDE HCL 10 MG PO TABS: 10.0000 mg | ORAL_TABLET | Freq: Three times a day (TID) | ORAL | Status: DC

## 2012-12-11 MED ORDER — PANTOPRAZOLE SODIUM 40 MG PO TBEC: 40.0000 mg | DELAYED_RELEASE_TABLET | Freq: Every day | ORAL | Status: DC

## 2012-12-11 NOTE — Progress Notes (Signed)
   [facility]    OFFICE PROGRESS NOTE   INTERVAL HISTORY:   She returns prior to a scheduled visit. She last received Sandostatin on 12/01/2012. For the past 3 weeks she reports nausea and intermittent emesis. These symptoms have persisted despite switching to a liquid diet. She continues to have output from the ostomy. This has not stopped. She feels "weak ".  Objective:  Vital signs in last 24 hours:  Blood pressure 154/76, pulse 74, temperature 97.8 F (36.6 C), temperature source Oral, resp. rate 18, height 5' 3.5" (1.613 m), weight 152 lb 4.8 oz (69.083 kg).    HEENT: No thrush or ulcers, the mucous membranes are moist Resp: Lungs clear bilaterally Cardio: Regular rate and rhythm GI: Firmness in the right mid abdomen without a discrete mass, bowel sounds are present, can semi-formed stool in the ostomy bag. No hepatosplenomegaly Vascular: No leg edema   Lab Results:  Potassium 3.4, BUN 13, creatinine 1.0, albumin 3.6  X-rays: Abdominal x-ray on 12/11/2012-normal bowel gas pattern. Negative for bowel obstruction. Abdominal x-ray 12/01/2012-no bowel gas is evident. No evidence of obstruction.  Medications: I have reviewed the patient's current medications.  Assessment/Plan: 1.Metastatic low grade carcinoid tumor, s/p 6 cycles of xeloda/temozolamide beginnning 09/06/2010.  -The chromagranin A and 24 hour Urine 5-HIAA were not significantly changed on 07/26/2011.  -Restaging CT 06/26/2012 with overall stable disease compared to a CT from June of 2011  2. Admission with SBO/SB infarction 06/08/2010, s/p resection of small bowel and colon.  3. S/p sigmoid colectomy/colostomy, right colectomy, and gastrojejunostomy in April 2009.  4. Elevated pre-operative 24-hour urine 5-HIAA.  5. Acute N/V April 2011, likely related to a partial small bowel obstruction due to tumor. Symptoms resolved with bowel rest and a liquid diet.  6. Intermittent loose stools-? Secondary to  bowel resection or carcinoid. Partially improved with Sandostatin LAR.  7. Intermittent episodes of nausea and right buttock pain, resolving spontaneously-? Related to carcinomatosis  8. Right scalp herpes zoster rash July 2013  10. Increased nausea and intermittent vomiting over the past several weeks. I suspect these symptoms are related to intra-abdominal progression of the metastatic carcinoid tumor.    Disposition:  She has been symptomatic with nausea/vomiting for the past several weeks, she has lost weight, and she appears ill. I discussed the likely progression of the abdominal carcinoid tumor with Ms. Milian and her daughter. She understands systemic treatment options are limited. Surgical options will also likely be limited by previous surgery and carcinomatosis.  She will receive intravenous fluids today and again on 12/15/2012. She will continue a liquid diet and add nutrition supplements. We will arrange for a visit with the cancer Center nutritionist. Ms. Krah will be scheduled for a restaging CT evaluation within the next one week. She will begin a trial of Reglan and Protonix. Phenergan helped the nausea but causes somnolence. She will contact us if these maneuvers do not help her symptoms. Ms. Fulbright will return for an office visit as scheduled 01/06/2012.   Thornton Papas, MD  12/11/2012  6:29 PM

## 2012-12-11 NOTE — Patient Instructions (Signed)
Dehydration, Adult Dehydration is when you lose more fluids from the body than you take in. Vital organs like the kidneys, brain, and heart cannot function without a proper amount of fluids and salt. Any loss of fluids from the body can cause dehydration.  CAUSES   Vomiting.  Diarrhea.  Excessive sweating.  Excessive urine output.  Fever. SYMPTOMS  Mild dehydration  Thirst.  Dry lips.  Slightly dry mouth. Moderate dehydration  Very dry mouth.  Sunken eyes.  Skin does not bounce back quickly when lightly pinched and released.  Dark urine and decreased urine production.  Decreased tear production.  Headache. Severe dehydration  Very dry mouth.  Extreme thirst.  Rapid, weak pulse (more than 100 beats per minute at rest).  Cold hands and feet.  Not able to sweat in spite of heat and temperature.  Rapid breathing.  Blue lips.  Confusion and lethargy.  Difficulty being awakened.  Minimal urine production.  No tears. DIAGNOSIS  Your caregiver will diagnose dehydration based on your symptoms and your exam. Blood and urine tests will help confirm the diagnosis. The diagnostic evaluation should also identify the cause of dehydration. TREATMENT  Treatment of mild or moderate dehydration can often be done at home by increasing the amount of fluids that you drink. It is best to drink small amounts of fluid more often. Drinking too much at one time can make vomiting worse. Refer to the home care instructions below. Severe dehydration needs to be treated at the hospital where you will probably be given intravenous (IV) fluids that contain water and electrolytes. HOME CARE INSTRUCTIONS   Ask your caregiver about specific rehydration instructions.  Drink enough fluids to keep your urine clear or pale yellow.  Drink small amounts frequently if you have nausea and vomiting-take your antiemetics as ordered  Eat as you normally do when not having GI  distress.  Avoid:  Foods or drinks high in sugar.  Carbonated drinks.  Juice.  Extremely hot or cold fluids.  Drinks with caffeine.  Fatty, greasy foods.  Alcohol.  Tobacco.  Overeating.  Gelatin desserts.  Wash your hands well to avoid spreading bacteria and viruses.  Only take over-the-counter or prescription medicines for pain, discomfort, or fever as directed by your caregiver.  Ask your caregiver if you should continue all prescribed and over-the-counter medicines.  Keep all follow-up appointments with your caregiver. SEEK MEDICAL CARE IF:  You have abdominal pain and it increases or stays in one area (localizes).  You have a rash, stiff neck, or severe headache.  You are irritable, sleepy, or difficult to awaken.  You are weak, dizzy, or extremely thirsty. SEEK IMMEDIATE MEDICAL CARE IF:   You are unable to keep fluids down or you get worse despite treatment.  You have frequent episodes of vomiting or diarrhea.  You have blood or green matter (bile) in your vomit.  You have blood in your stool or your stool looks black and tarry.  You have not urinated in 6 to 8 hours, or you have only urinated a small amount of very dark urine.  You have a fever.  You faint. MAKE SURE YOU:   *Start your Reglan 10 mg before each meal (three times daily) and Protonix 40 mg daily to help with acid reflux and nausea.  Understand these instructions.  Will watch your condition.  Will get help right away if you are not doing well or get worse. Document Released: 12/06/2005 Document Revised: 02/28/2012 Document Reviewed: 07/26/2011 ExitCare  Patient Information 2013 [employer].  

## 2012-12-11 NOTE — Patient Instructions (Signed)
Dehydration, Adult Dehydration is when you lose more fluids from the body than you take in. Vital organs like the kidneys, brain, and heart cannot function without a proper amount of fluids and salt. Any loss of fluids from the body can cause dehydration.  CAUSES   Vomiting.  Diarrhea.  Excessive sweating.  Excessive urine output.  Fever. SYMPTOMS  Mild dehydration  Thirst.  Dry lips.  Slightly dry mouth. Moderate dehydration  Very dry mouth.  Sunken eyes.  Skin does not bounce back quickly when lightly pinched and released.  Dark urine and decreased urine production.  Decreased tear production.  Headache. Severe dehydration  Very dry mouth.  Extreme thirst.  Rapid, weak pulse (more than 100 beats per minute at rest).  Cold hands and feet.  Not able to sweat in spite of heat and temperature.  Rapid breathing.  Blue lips.  Confusion and lethargy.  Difficulty being awakened.  Minimal urine production.  No tears. DIAGNOSIS  Your caregiver will diagnose dehydration based on your symptoms and your exam. Blood and urine tests will help confirm the diagnosis. The diagnostic evaluation should also identify the cause of dehydration. TREATMENT  Treatment of mild or moderate dehydration can often be done at home by increasing the amount of fluids that you drink. It is best to drink small amounts of fluid more often. Drinking too much at one time can make vomiting worse. Refer to the home care instructions below. Severe dehydration needs to be treated at the hospital where you will probably be given intravenous (IV) fluids that contain water and electrolytes. HOME CARE INSTRUCTIONS   Ask your caregiver about specific rehydration instructions.  Drink enough fluids to keep your urine clear or pale yellow.  Drink small amounts frequently if you have nausea and vomiting.  Eat as you normally do.  Avoid:  Foods or drinks high in sugar.  Carbonated  drinks.  Juice.  Extremely hot or cold fluids.  Drinks with caffeine.  Fatty, greasy foods.  Alcohol.  Tobacco.  Overeating.  Gelatin desserts.  Wash your hands well to avoid spreading bacteria and viruses.  Only take over-the-counter or prescription medicines for pain, discomfort, or fever as directed by your caregiver.  Ask your caregiver if you should continue all prescribed and over-the-counter medicines.  Keep all follow-up appointments with your caregiver. SEEK MEDICAL CARE IF:  You have abdominal pain and it increases or stays in one area (localizes).  You have a rash, stiff neck, or severe headache.  You are irritable, sleepy, or difficult to awaken.  You are weak, dizzy, or extremely thirsty. SEEK IMMEDIATE MEDICAL CARE IF:   You are unable to keep fluids down or you get worse despite treatment.  You have frequent episodes of vomiting or diarrhea.  You have blood or green matter (bile) in your vomit.  You have blood in your stool or your stool looks black and tarry.  You have not urinated in 6 to 8 hours, or you have only urinated a small amount of very dark urine.  You have a fever.  You faint. MAKE SURE YOU:   Understand these instructions.  Will watch your condition.  Will get help right away if you are not doing well or get worse. Document Released: 12/06/2005 Document Revised: 02/28/2012 Document Reviewed: 07/26/2011 Exit[employer] Patient Information 2013 [employer].  

## 2012-12-11 NOTE — Telephone Encounter (Signed)
Persistent nausea w/vomiting over the weekend. Not able to eat solid foods and can barely keep fluids down. Asking for her mother to be seen and something done before Christmas. Patient confirms increase in N/V despite Zofran. Abdomen with intermittent sensation of feeling tight, then soft. Ostomy output normal. Confirms wish to be seen today. Per Dr. Truett Perna, have abdomen xray and work in at 1145. Patient agrees and will come in.

## 2012-12-11 NOTE — Telephone Encounter (Signed)
lmonvm for pt confirming next appts for 12/27 and 1/10. Also lm that [facility] would contact pt w/ct appt.

## 2012-12-11 NOTE — Telephone Encounter (Signed)
Called Marcelino Duster for IV for today and Friday, and advise patient to get calendar. Informed them that MD write order and we processed it, pt informed me that MD told them to go straight to chemo room and they don't need anything from scheduler but appt for CT. Pt went to chemo room

## 2012-12-11 NOTE — Telephone Encounter (Signed)
Perstaff message and POF I have scheudled appts. JWM

## 2012-12-12 ENCOUNTER — Telehealth: Payer: Self-pay | Admitting: Oncology

## 2012-12-12 NOTE — Telephone Encounter (Signed)
s.w. pt and advised on 12.27.13 appt schedule.Marland KitchenMarland KitchenMarland KitchenMarland Kitchenpt aware

## 2012-12-15 ENCOUNTER — Other Ambulatory Visit: Payer: Self-pay | Admitting: Oncology

## 2012-12-15 ENCOUNTER — Encounter: Payer: Medicare Other | Admitting: Nutrition

## 2012-12-15 ENCOUNTER — Ambulatory Visit (HOSPITAL_BASED_OUTPATIENT_CLINIC_OR_DEPARTMENT_OTHER): Payer: Medicare Other

## 2012-12-15 ENCOUNTER — Other Ambulatory Visit: Payer: Medicare Other

## 2012-12-15 ENCOUNTER — Other Ambulatory Visit: Payer: Self-pay | Admitting: *Deleted

## 2012-12-15 DIAGNOSIS — E860 Dehydration: Secondary | ICD-10-CM

## 2012-12-15 DIAGNOSIS — D3A098 Benign carcinoid tumors of other sites: Secondary | ICD-10-CM

## 2012-12-15 DIAGNOSIS — R1110 Vomiting, unspecified: Secondary | ICD-10-CM

## 2012-12-15 MED ORDER — PROMETHAZINE HCL 25 MG/ML IJ SOLN
6.2500 mg | Freq: Once | INTRAMUSCULAR | Status: AC
  Administered 2012-12-15: 6.25 mg via INTRAVENOUS
  Filled 2012-12-15: qty 1

## 2012-12-15 MED ORDER — SODIUM CHLORIDE 0.9 % IV SOLN
Freq: Once | INTRAVENOUS | Status: AC
  Administered 2012-12-15: 14:00:00 via INTRAVENOUS

## 2012-12-15 NOTE — Patient Instructions (Addendum)
Dehydration, Adult Dehydration is when you lose more fluids from the body than you take in. Vital organs like the kidneys, brain, and heart cannot function without a proper amount of fluids and salt. Any loss of fluids from the body can cause dehydration.  CAUSES   Vomiting.  Diarrhea.  Excessive sweating.  Excessive urine output.  Fever. SYMPTOMS  Mild dehydration  Thirst.  Dry lips.  Slightly dry mouth. Moderate dehydration  Very dry mouth.  Sunken eyes.  Skin does not bounce back quickly when lightly pinched and released.  Dark urine and decreased urine production.  Decreased tear production.  Headache. Severe dehydration  Very dry mouth.  Extreme thirst.  Rapid, weak pulse (more than 100 beats per minute at rest).  Cold hands and feet.  Not able to sweat in spite of heat and temperature.  Rapid breathing.  Blue lips.  Confusion and lethargy.  Difficulty being awakened.  Minimal urine production.  No tears. DIAGNOSIS  Your caregiver will diagnose dehydration based on your symptoms and your exam. Blood and urine tests will help confirm the diagnosis. The diagnostic evaluation should also identify the cause of dehydration. TREATMENT  Treatment of mild or moderate dehydration can often be done at home by increasing the amount of fluids that you drink. It is best to drink small amounts of fluid more often. Drinking too much at one time can make vomiting worse. Refer to the home care instructions below. Severe dehydration needs to be treated at the hospital where you will probably be given intravenous (IV) fluids that contain water and electrolytes. HOME CARE INSTRUCTIONS   Ask your caregiver about specific rehydration instructions.  Drink enough fluids to keep your urine clear or pale yellow.  Drink small amounts frequently if you have nausea and vomiting.  Eat as you normally do.  Avoid:  Foods or drinks high in sugar.  Carbonated  drinks.  Juice.  Extremely hot or cold fluids.  Drinks with caffeine.  Fatty, greasy foods.  Alcohol.  Tobacco.  Overeating.  Gelatin desserts.  Wash your hands well to avoid spreading bacteria and viruses.  Only take over-the-counter or prescription medicines for pain, discomfort, or fever as directed by your caregiver.  Ask your caregiver if you should continue all prescribed and over-the-counter medicines.  Keep all follow-up appointments with your caregiver. SEEK MEDICAL CARE IF:  You have abdominal pain and it increases or stays in one area (localizes).  You have a rash, stiff neck, or severe headache.  You are irritable, sleepy, or difficult to awaken.  You are weak, dizzy, or extremely thirsty. SEEK IMMEDIATE MEDICAL CARE IF:   You are unable to keep fluids down or you get worse despite treatment.  You have frequent episodes of vomiting or diarrhea.  You have blood or green matter (bile) in your vomit.  You have blood in your stool or your stool looks black and tarry.  You have not urinated in 6 to 8 hours, or you have only urinated a small amount of very dark urine.  You have a fever.  You faint. MAKE SURE YOU:   Understand these instructions.  Will watch your condition.  Will get help right away if you are not doing well or get worse. Document Released: 12/06/2005 Document Revised: 02/28/2012 Document Reviewed: 07/26/2011 [employer] Patient Information 2013 [employer].  

## 2012-12-15 NOTE — Progress Notes (Signed)
1630 Patient  vomitting x2. Dr Truett Perna notified. Phenergan given as ordered.  1700 Patient states feeling better. VSS. Per Dr. Truett Perna patient ok for discharge. Patient discharged ambulating and with daughter driving. AVS provided and patient advised if she is having worse symptoms to not hesitate to go to ED. All questions answered.

## 2012-12-18 ENCOUNTER — Ambulatory Visit (HOSPITAL_COMMUNITY)
Admission: RE | Admit: 2012-12-18 | Discharge: 2012-12-18 | Disposition: A | Discharge status: Home / Self Care | Payer: Medicare Other | Source: Ambulatory Visit | Attending: Oncology | Admitting: Oncology

## 2012-12-18 DIAGNOSIS — D490 Neoplasm of unspecified behavior of digestive system: Secondary | ICD-10-CM | POA: Insufficient documentation

## 2012-12-18 DIAGNOSIS — D3A098 Benign carcinoid tumors of other sites: Secondary | ICD-10-CM

## 2012-12-18 DIAGNOSIS — C787 Secondary malignant neoplasm of liver and intrahepatic bile duct: Secondary | ICD-10-CM | POA: Insufficient documentation

## 2012-12-18 MED ORDER — IOHEXOL 300 MG/ML  SOLN
100.0000 mL | Freq: Once | INTRAMUSCULAR | Status: AC | PRN
  Administered 2012-12-18: 100 mL via INTRAVENOUS

## 2012-12-18 NOTE — Progress Notes (Signed)
100 ml omni 300 infiltrated into the rt. Ac, Dr. Signa Kell evaluated patients arm, ice applied immediately, instructions given to patient and phone number with any questions, Pt. Waiting for 2 hours to evaluate her arm before she goes home.

## 2012-12-19 ENCOUNTER — Encounter (HOSPITAL_COMMUNITY): Payer: Self-pay | Admitting: Emergency Medicine

## 2012-12-19 ENCOUNTER — Emergency Department (HOSPITAL_COMMUNITY): Payer: Medicare Other

## 2012-12-19 ENCOUNTER — Telehealth: Payer: Self-pay | Admitting: *Deleted

## 2012-12-19 ENCOUNTER — Ambulatory Visit: Payer: Medicare Other | Admitting: Oncology

## 2012-12-19 ENCOUNTER — Inpatient Hospital Stay (HOSPITAL_COMMUNITY)
Admission: EM | Admit: 2012-12-19 | Discharge: 2012-12-26 | DRG: 388 | Disposition: A | Discharge status: Home / Self Care | Payer: Medicare Other | Attending: Internal Medicine | Admitting: Internal Medicine

## 2012-12-19 ENCOUNTER — Other Ambulatory Visit: Payer: Medicare Other | Admitting: Lab

## 2012-12-19 DIAGNOSIS — N179 Acute kidney failure, unspecified: Secondary | ICD-10-CM | POA: Diagnosis present

## 2012-12-19 DIAGNOSIS — I10 Essential (primary) hypertension: Secondary | ICD-10-CM | POA: Diagnosis present

## 2012-12-19 DIAGNOSIS — D490 Neoplasm of unspecified behavior of digestive system: Secondary | ICD-10-CM

## 2012-12-19 DIAGNOSIS — Z79899 Other long term (current) drug therapy: Secondary | ICD-10-CM

## 2012-12-19 DIAGNOSIS — E860 Dehydration: Secondary | ICD-10-CM | POA: Diagnosis present

## 2012-12-19 DIAGNOSIS — K56600 Partial intestinal obstruction, unspecified as to cause: Secondary | ICD-10-CM

## 2012-12-19 DIAGNOSIS — K5669 Other intestinal obstruction: Principal | ICD-10-CM | POA: Diagnosis present

## 2012-12-19 DIAGNOSIS — N189 Chronic kidney disease, unspecified: Secondary | ICD-10-CM

## 2012-12-19 DIAGNOSIS — Z933 Colostomy status: Secondary | ICD-10-CM

## 2012-12-19 DIAGNOSIS — C787 Secondary malignant neoplasm of liver and intrahepatic bile duct: Secondary | ICD-10-CM | POA: Diagnosis present

## 2012-12-19 DIAGNOSIS — Z9049 Acquired absence of other specified parts of digestive tract: Secondary | ICD-10-CM

## 2012-12-19 DIAGNOSIS — Z6826 Body mass index (BMI) 26.0-26.9, adult: Secondary | ICD-10-CM

## 2012-12-19 DIAGNOSIS — E876 Hypokalemia: Secondary | ICD-10-CM | POA: Diagnosis present

## 2012-12-19 DIAGNOSIS — C260 Malignant neoplasm of intestinal tract, part unspecified: Secondary | ICD-10-CM | POA: Diagnosis present

## 2012-12-19 DIAGNOSIS — E43 Unspecified severe protein-calorie malnutrition: Secondary | ICD-10-CM | POA: Diagnosis present

## 2012-12-19 DIAGNOSIS — D3A098 Benign carcinoid tumors of other sites: Secondary | ICD-10-CM | POA: Diagnosis present

## 2012-12-19 DIAGNOSIS — E875 Hyperkalemia: Secondary | ICD-10-CM | POA: Diagnosis present

## 2012-12-19 DIAGNOSIS — Z9221 Personal history of antineoplastic chemotherapy: Secondary | ICD-10-CM

## 2012-12-19 DIAGNOSIS — R112 Nausea with vomiting, unspecified: Secondary | ICD-10-CM | POA: Diagnosis present

## 2012-12-19 DIAGNOSIS — K56609 Unspecified intestinal obstruction, unspecified as to partial versus complete obstruction: Secondary | ICD-10-CM | POA: Diagnosis present

## 2012-12-19 HISTORY — DX: Adverse effect of unspecified anesthetic, initial encounter: T41.45XA

## 2012-12-19 HISTORY — DX: Other complications of anesthesia, initial encounter: T88.59XA

## 2012-12-19 HISTORY — DX: Chronic kidney disease, unspecified: N18.9

## 2012-12-19 LAB — COMPREHENSIVE METABOLIC PANEL
Alkaline Phosphatase: 222 U/L — ABNORMAL HIGH (ref 39–117)
BUN: 18 mg/dL (ref 6–23)
GFR calc Af Amer: 32 mL/min — ABNORMAL LOW (ref >90)
GFR calc non Af Amer: 27 mL/min — ABNORMAL LOW (ref >90)
Glucose, Bld: 136 mg/dL — ABNORMAL HIGH (ref 70–99)
Potassium: 3.2 mEq/L — ABNORMAL LOW (ref 3.5–5.1)
Total Bilirubin: 0.9 mg/dL (ref 0.3–1.2)
Total Protein: 7.7 g/dL (ref 6.0–8.3)

## 2012-12-19 LAB — CBC WITH DIFFERENTIAL/PLATELET
Eosinophils Absolute: 0 10*3/uL (ref 0.0–0.7)
Hemoglobin: 14.3 g/dL (ref 12.0–15.0)
Lymphs Abs: 1.1 10*3/uL (ref 0.7–4.0)
MCH: 30.4 pg (ref 26.0–34.0)
MCV: 88.3 fL (ref 78.0–100.0)
Monocytes Relative: 5 % (ref 3–12)
Neutrophils Relative %: 86 % — ABNORMAL HIGH (ref 43–77)
RBC: 4.71 MIL/uL (ref 3.87–5.11)

## 2012-12-19 LAB — LIPASE, BLOOD: Lipase: 28 U/L (ref 11–59)

## 2012-12-19 LAB — PHOSPHORUS: Phosphorus: 4.6 mg/dL (ref 2.3–4.6)

## 2012-12-19 LAB — HEPATIC FUNCTION PANEL
Bilirubin, Direct: 0.3 mg/dL (ref 0.0–0.3)
Indirect Bilirubin: 0.5 mg/dL (ref 0.3–0.9)
Total Bilirubin: 0.8 mg/dL (ref 0.3–1.2)

## 2012-12-19 MED ORDER — SODIUM CHLORIDE 0.9 % IV SOLN
1000.0000 mL | Freq: Once | INTRAVENOUS | Status: AC
  Administered 2012-12-19: 1000 mL via INTRAVENOUS

## 2012-12-19 MED ORDER — ACETAMINOPHEN 650 MG RE SUPP: 650.0000 mg | Freq: Four times a day (QID) | RECTAL | Status: DC | PRN

## 2012-12-19 MED ORDER — ONDANSETRON HCL 4 MG PO TABS: 4.0000 mg | ORAL_TABLET | Freq: Four times a day (QID) | ORAL | Status: DC | PRN

## 2012-12-19 MED ORDER — SODIUM CHLORIDE 0.9 % IV SOLN
INTRAVENOUS | Status: AC
  Administered 2012-12-19 – 2012-12-20 (×2): via INTRAVENOUS

## 2012-12-19 MED ORDER — POTASSIUM CHLORIDE 10 MEQ/100ML IV SOLN: 10.0000 meq | Freq: Once | INTRAVENOUS | Status: DC

## 2012-12-19 MED ORDER — ENOXAPARIN SODIUM 30 MG/0.3ML ~~LOC~~ SOLN
30.0000 mg | SUBCUTANEOUS | Status: DC
  Administered 2012-12-19 – 2012-12-20 (×2): 30 mg via SUBCUTANEOUS
  Filled 2012-12-19 (×3): qty 0.3

## 2012-12-19 MED ORDER — SODIUM CHLORIDE 0.9 % IV SOLN
1000.0000 mL | INTRAVENOUS | Status: DC
  Administered 2012-12-19 (×2): 1000 mL via INTRAVENOUS

## 2012-12-19 MED ORDER — ONDANSETRON HCL 4 MG/2ML IJ SOLN
4.0000 mg | Freq: Four times a day (QID) | INTRAMUSCULAR | Status: DC | PRN
  Administered 2012-12-19: 4 mg via INTRAVENOUS
  Filled 2012-12-19: qty 2

## 2012-12-19 MED ORDER — POTASSIUM CHLORIDE 10 MEQ/100ML IV SOLN
10.0000 meq | INTRAVENOUS | Status: DC
  Administered 2012-12-19: 10 meq via INTRAVENOUS
  Filled 2012-12-19: qty 100

## 2012-12-19 MED ORDER — ACETAMINOPHEN 325 MG PO TABS: 650.0000 mg | ORAL_TABLET | Freq: Four times a day (QID) | ORAL | Status: DC | PRN

## 2012-12-19 MED ORDER — HYDRALAZINE HCL 20 MG/ML IJ SOLN
5.0000 mg | Freq: Four times a day (QID) | INTRAMUSCULAR | Status: DC | PRN
  Administered 2012-12-24: 5 mg via INTRAVENOUS
  Filled 2012-12-19: qty 0.25

## 2012-12-19 MED ORDER — PANTOPRAZOLE SODIUM 40 MG IV SOLR
40.0000 mg | INTRAVENOUS | Status: DC
  Administered 2012-12-20 – 2012-12-25 (×6): 40 mg via INTRAVENOUS
  Filled 2012-12-19 (×7): qty 40

## 2012-12-19 MED ORDER — MAGNESIUM SULFATE 40 MG/ML IJ SOLN
2.0000 g | Freq: Once | INTRAMUSCULAR | Status: AC
  Administered 2012-12-19: 2 g via INTRAVENOUS
  Filled 2012-12-19: qty 50

## 2012-12-19 MED ORDER — MORPHINE SULFATE 2 MG/ML IJ SOLN: 1.0000 mg | INTRAMUSCULAR | Status: DC | PRN

## 2012-12-19 MED ORDER — POTASSIUM CHLORIDE 10 MEQ/100ML IV SOLN
10.0000 meq | INTRAVENOUS | Status: AC
  Administered 2012-12-19 (×2): 10 meq via INTRAVENOUS
  Filled 2012-12-19 (×4): qty 100

## 2012-12-19 MED ORDER — PROMETHAZINE HCL 25 MG/ML IJ SOLN
12.5000 mg | Freq: Four times a day (QID) | INTRAMUSCULAR | Status: DC | PRN
  Administered 2012-12-19: 25 mg via INTRAVENOUS
  Filled 2012-12-19: qty 1

## 2012-12-19 MED ORDER — ONDANSETRON HCL 4 MG/2ML IJ SOLN
4.0000 mg | Freq: Once | INTRAMUSCULAR | Status: AC
  Administered 2012-12-19: 4 mg via INTRAVENOUS
  Filled 2012-12-19: qty 2

## 2012-12-19 NOTE — Progress Notes (Signed)
  Critical value received:  Magnesium 0.7  Date of notification:  12/19/2012  Time of notification:  2134  Critical value read back:yes  Nurse who received alert:  Patte Winkel  MD notified (1st page):  Craige Cotta  Time of first page:  2135  MD notified (2nd page):  Time of second page:  Responding MD:    Time MD responded:

## 2012-12-19 NOTE — Telephone Encounter (Signed)
Made [facility] emergency room charge nurse aware of patient coming in and to call surgeon when she arrives.

## 2012-12-19 NOTE — Telephone Encounter (Signed)
Made Kristi in triage w/[facility] aware of CT results and patient going to [facility] ER. Will need to be seen by Dr. Ezzard Standing or Dr. Abbey Chatters.

## 2012-12-19 NOTE — H&P (Signed)
[facility] History and Physical  Lori Dennis ZOX:[id number] DOB: 12/04/1946 DOA: 12/19/2012  Referring physician: Dr. Patria Mane PCP: Lillia Mountain, MD  Specialists: Dr. Truett Perna  Chief Complaint: nausea, vomiting, dehydration and ??SBO  HPI: Lori Dennis is a 66 y.o. female past medical history significant for metastatic carcinoid tumor and hypertension; came to the hospital secondary to nausea, vomiting and difficulty keeping things down. Patient was recently seen by her oncologist (Dr. Truett Perna) on 12/18/2012 at that moment CT of the abdomen and pelvis with contrast has been done in order to have oh wait patient carcinoid tumor he was found by lactation of her stomach and small bowel and the patient was advised to go to the admission department for further evaluation and treatment. She has been experiencing nausea, vomiting and difficulty keeping things down for the last week or so. In the ED patient was found with profound dehydration, acute renal failure and hyperkalemia. [facility] has been called to the patient for further evaluation and treatment. Surgery has been consulted by ED physician and they're planning to see the patient and provide further recommendations.   Review of Systems:  Negative except as mentioned on history of present illness.  Past Medical History  Diagnosis Date  . Cancer 02/19/2008    Metastatic carcinoid tumor  . Hypertension   . Complication of anesthesia     slow to wake up   Past Surgical History  Procedure Date  . Right colectomy 04/02/2008    with descending colostomy & gastrojejunostomy  . Vaginal hysterectomy     at age 59  . Cholecystectomy     age 46   Social History:  reports that she has never smoked. She has never used smokeless tobacco. She reports that she does not drink alcohol or use illicit drugs. lives at home, requiring no assistance with activities of daily living.   No Known Allergies  Family history:  Significant for hypertension otherwise no pertinent  Prior to Admission medications   Medication Sig Start Date End Date Taking? Authorizing Provider  amLODipine (NORVASC) 10 MG tablet Take 10 mg by mouth daily.   Yes Historical Provider, MD  Cholecalciferol (VITAMIN D3) 1000 UNITS CAPS Take 1 capsule by mouth daily.     Yes Historical Provider, MD  LOSARTAN POTASSIUM PO Take 100 mg by mouth daily.   Yes Historical Provider, MD  metoCLOPramide (REGLAN) 10 MG tablet Take 1 tablet (10 mg total) by mouth 3 (three) times daily before meals. 12/11/12  Yes Ladene Artist, MD  Multiple Vitamin (MULTIVITAMIN) capsule Take 1 capsule by mouth daily.     Yes Historical Provider, MD  Octreotide Acetate (SANDOSTATIN IJ) Inject as directed. Pt gets this injection every 3 weeks. Cant confirm what the dosage is. Followed by Dr Alcide Evener   Yes Historical Provider, MD  ondansetron (ZOFRAN) 8 MG tablet Take 1 tablet (8 mg total) by mouth every 12 (twelve) hours as needed for nausea. 12/07/12  Yes Ladene Artist, MD  pantoprazole (PROTONIX) 40 MG tablet Take 1 tablet (40 mg total) by mouth daily. 12/11/12  Yes Ladene Artist, MD  promethazine (PHENERGAN) 12.5 MG tablet Take 1/2 - 1 tablet PO Q6h prn nausea 06/07/12  Yes Ladene Artist, MD   Physical Exam: Filed Vitals:   12/19/12 1416  BP: 125/76  Pulse: 87  Temp: 98.4 F (36.9 C)  TempSrc: Oral  Resp: 16  SpO2: 100%     General:  ill appearance, dry mucous membranes, afebrile, cooperative to  examination.  Eyes: No icterus, no nystagmus, extraocular muscles intact, PERRLA  ENT: Dry mucous membranes, good dentition, no erythema or exudate inside her mouth, no drainage out of her ears or nostrils  Neck: Supple, no JVD, no bruits, no thyromegaly.  Cardiovascular: Regular rate and rhythm, no murmurs, no rubs, no gallops  Respiratory: Clear to auscultation bilaterally.  Abdomen: Soft, nontender, colostomy in place with some liquid stool inside  colostomy bag  Skin: No rash no petechiae  Musculoskeletal: No joint swelling or erythema; no edema or cyanosis of her lower extremities  Psychiatric: No suicidal ideation, no hallucinations  Neurologic: Cranial nerve intact, muscle strength 4-5 bilaterally and symmetrically, no focal motor or sensory deficit  Labs on Admission:  Basic Metabolic Panel:  Lab 12/19/12 1610  NA 133*  K 3.2*  CL 90*  CO2 25  GLUCOSE 136*  BUN 18  CREATININE 1.84*  CALCIUM 7.1*  MG --  PHOS --   Liver Function Tests:  Lab 12/19/12 1430  AST 45*  ALT 38*  ALKPHOS 222*  BILITOT 0.9  PROT 7.7  ALBUMIN 3.4*    Lab 12/19/12 1430  LIPASE 28  AMYLASE --   CBC:  Lab 12/19/12 1430  WBC 12.7*  NEUTROABS 10.9*  HGB 14.3  HCT 41.6  MCV 88.3  PLT 310   Radiological Exams on Admission: Ct Abdomen Pelvis W Contrast  12/18/2012  *RADIOLOGY REPORT*  Clinical Data: Restaging carcinoid tumor  CT ABDOMEN AND PELVIS WITH CONTRAST  Technique:  Multidetector CT imaging of the abdomen and pelvis was performed following the standard protocol during bolus administration of intravenous contrast.  Contrast: OMNIPAQUE IOHEXOL 300 MG/ML  SOLN  Comparison: 06/26/2012  Findings:  Lung bases:  There is no pericardial or pleural effusion.  Areas of subpleural ground-glass attenuation are noted within the right middle lobe and there are patchy areas of ground-glass attenuation within both lower lobes.  Abdomen/pelvis: There is marked diffuse low attenuation throughout the liver consistent with hepatic steatosis.  Nodule in the right hepatic lobe measures 5 mm, image 41.  This appears new since the previous exam.  Subcapsular nodule in the posterior inferior right hepatic lobe is stable measuring 8 mm.  Peripheral subcapsular nodule within the anterior right hepatic lobe measures 4 mm, image 31. Previously 5 mm.  Subcapsular tumor overlying the anterior right hepatic lobe measures 1.1 x 4.0 cm, image 39.   Previously this measured 0.6 x 3.6 cm.  Hyperattenuating subcapsular nodule overlying the inferior posterior right hepatic lobe measures 1.6 x 0.5 cm, image 50.  New from previous study.  Previous cholecystectomy.  There is no biliary dilatation.  The pancreas is unremarkable.  The spleen is normal.  Normal appearance of the adrenal glands.  The right kidney appears normal.  Cyst is identified within the inferior pole the left kidney.  The urinary bladder appears normal for degree of distention.  Previous hysterectomy.  Cystic and solid mass within the right side of pelvis is again noted.  This measures 7.6 x 6.6 cm, image 102.  Previously this measured 7.9 x 6.0 cm.  Fat and soft tissue density mass in the left adnexa measures 4.7 x 3.8 cm, image 107.  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  Partially calcified soft tissue lesion inferior to the pancreatic neck measures 1.6 cm, image 74.  Previously 1.3 cm.  Additional peritoneal lesion is identified centrally, image 86.  This measures 2.7 cm.  Previously 1.7 cm.  Retroperitoneal adenopathy measures up to 1.3 cm in the low aortocaval station, image 74.  Previously 1.1 cm.  Omental nodularity is again noted.  Index nodule measures 1.5 x 2.0 cm, image 90.  This is unchanged from previous exam.  Proximal small bowel loops are increased in caliber.  The bowel loops appear tethered within the right lower quadrant of the abdomen, image 90.   which represents a transition to relatively decreased caliber distal small bowel and colon.  Bones/Musculoskeletal:  Review of the visualized bony structures demonstrates no worrisome lytic or sclerotic bone lesions.  IMPRESSION:  1. Stomach and the small bowel loops are abnormally increased in caliber which are worrisome for a bowel obstruction.  The point of obstruction appears to be within the right lower quadrant of the abdomen with the bowel loops appear "tethered."   Careful clinical correlation is advised. 2.  Mild progression of the hypervascular liver metastasis which predominately involves the subcapsular liver. 3.  Stable appearance of peritoneal and omental disease. 3.  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  These results will be called to the ordering clinician or representative by the Radiologist Assistant, and communication documented in the PACS Dashboard.   Original Report Authenticated By: Signa Kell, M.D.    Dg Abd 2 Views  12/19/2012  *RADIOLOGY REPORT*  Clinical Data: Small bowel obstruction.  Nausea vomiting.  ABDOMEN - 2 VIEW  Comparison: 12/11/2012  Findings: Scattered air fluid levels are seen but no significantly dilated bowel loops are visualized on today's exam.  A small amount of residual oral contrast is seen in nondilated bowel loops, and residual contrast from recent CT is seen in the urinary tract.  No evidence of free intraperitoneal air.  IMPRESSION: Scattered air fluid levels, without gaseous dilatation of bowel or free air.   Original Report Authenticated By: Myles Rosenthal, M.D.    Assessment/Plan 1-Carcinoid tumor of intestine: With recent CT of the abdomen demonstrating mild progression of the hypervascular liver metastases; a stable appearance of peritoneal and omental disease; no significant change on complex mass of left ovarian dermoid. These CT of abdomen also demonstrated the stomach, small bowel loops abnormally increase in caliber which were of high concerns for a bowel obstruction. Further recommendations per oncology service.  2-nausea/vomiting: Most likely secondary to partial SBO as demonstrated on abdominal x-ray at that recent CT of abdomen. Patient will be admitted to MedSurg, will be kept n.p.o. for bowel rest, fluid resuscitation, PRN antiemetics and analgesics. [facility] has been consulted for further recommendations. This is stool present on patient's colostomy.  No significant abdominal pain on physical exam.  3-HTN (hypertension): Medications for blood pressure will be held secondary to n.p.o. status and acute renal failure. Will use PRN hydralazine with parameters for high blood pressure.  4-Acute renal failure: Most likely secondary to recent contrast exposure, prerenal azotemia due to dehydration and volume contraction (with ongoing nausea/vomiting), and continue use of ARB. Will provide fluid resuscitation and holding nephrotoxic agents. Follow creatinine trend. Will check UA, random urine sodium and random urine creatinine. Depending on clinical evolution patient might also require renal ultrasound.  5-Hypokalemia: Will replete and check magnesium and phosphorus level.  6-Dehydration: Will provide fluid resuscitation.  DVT: Lovenox   Code Status: Full code Family Communication: Daughter and son in [place] at bedside. Disposition Plan: Will admit and send patient to MedSurg bed, n.p.o., follow recommendation from [facility], when necessary antiemetics, electrolytes repletion; oncology  service consultation in the morning.  Time spent: >30 minutes  Verenis Nicosia [facility] Pager [phone]  If 7PM-7AM, please contact night-coverage [email or web address] Password [email or web address] 12/19/2012, 6:18 PM

## 2012-12-19 NOTE — ED Provider Notes (Signed)
History     CSN: [id number]  Arrival date & time 12/19/12  1349   First MD Initiated Contact with Patient 12/19/12 1532      Chief Complaint  Patient presents with  . Nausea  . Emesis  . SBO?      The history is provided by the patient.   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  It seems to worsen the last couple days.  CT scan was obtained yesterday by her hematologist and demonstrated what appeared to be a small bowel obstruction.  She was sent to the emergency department today for evaluation.  The patient reports ongoing output from her ileostomy.  She denies abdominal distention.  She has nausea without vomiting.  She does report a reduction tries to eat or drink something she throws it back up.  She feels weak and dehydrated.  Her family is concerned about her intake and her hydration status.  Her symptoms are mild to moderate in severity.  No fevers or chills.  No urinary complaints.  No melena or hematochezia.  No other complaints.  No hematemesis.  Past Medical History  Diagnosis Date  . Cancer 02/19/2008    Metastatic carcinoid tumor  . Hypertension     Past Surgical History  Procedure Date  . Right colectomy 04/02/2008    with descending colostomy & gastrojejunostomy  . Vaginal hysterectomy     at age 40  . Cholecystectomy     age 79    History reviewed. No pertinent family history.  History  Substance Use Topics  . Smoking status: Never Smoker   . Smokeless tobacco: Not on file  . Alcohol Use: No    OB History    Grav Para Term Preterm Abortions TAB SAB Ect Mult Living                  Review of Systems  All other systems reviewed and are negative.    Allergies  Review of patient's allergies indicates no known allergies.  Home Medications   Current Outpatient Rx  Name  Route  Sig  Dispense  Refill  . AMLODIPINE BESYLATE 10 MG PO TABS   Oral   Take 10 mg by mouth daily.         Marland Kitchen VITAMIN D3 1000 UNITS PO CAPS   Oral   Take 1 capsule by mouth daily.           Marland Kitchen LOSARTAN POTASSIUM PO   Oral   Take 100 mg by mouth daily.         Marland Kitchen METOCLOPRAMIDE HCL 10 MG PO TABS   Oral   Take 1 tablet (10 mg total) by mouth 3 (three) times daily before meals.   90 tablet   2   . MULTIVITAMINS PO CAPS   Oral   Take 1 capsule by mouth daily.           Marland Kitchen SANDOSTATIN IJ   Injection   Inject as directed. Pt gets this injection every 3 weeks. Cant confirm what the dosage is. Followed by Dr Alcide Evener         . ONDANSETRON HCL 8 MG PO TABS   Oral   Take 1 tablet (8 mg total) by mouth every 12 (twelve) hours as needed for nausea.   30 tablet   1   . PANTOPRAZOLE SODIUM 40 MG PO TBEC   Oral   Take 1  tablet (40 mg total) by mouth daily.   30 tablet   2   . PROMETHAZINE HCL 12.5 MG PO TABS      Take 1/2 - 1 tablet PO Q6h prn nausea   20 tablet   0     BP 125/76  Pulse 87  Temp 98.4 F (36.9 C) (Oral)  Resp 16  SpO2 100%  Physical Exam  Nursing note and vitals reviewed. Constitutional: She is oriented to person, place, and time. She appears well-developed and well-nourished. No distress.  HENT:  Head: Normocephalic and atraumatic.  Eyes: EOM are normal.  Neck: Normal range of motion.  Cardiovascular: Normal rate, regular rhythm and normal heart sounds.   Pulmonary/Chest: Effort normal and breath sounds normal.  Abdominal: Soft. She exhibits no distension.       Mild generalized abdominal tenderness without guarding or rebound.  Ileostomy with brown green stool  Musculoskeletal: Normal range of motion.  Neurological: She is alert and oriented to person, place, and time.  Skin: Skin is warm and dry.  Psychiatric: She has a normal mood and affect. Judgment normal.    ED Course  Procedures (including critical care time)  Labs Reviewed  CBC WITH DIFFERENTIAL - Abnormal; Notable for the following:    WBC 12.7 (*)     Neutrophils Relative 86 (*)      Neutro Abs 10.9 (*)     Lymphocytes Relative 9 (*)     All other components within normal limits  COMPREHENSIVE METABOLIC PANEL - Abnormal; Notable for the following:    Sodium 133 (*)     Potassium 3.2 (*)     Chloride 90 (*)     Glucose, Bld 136 (*)     Creatinine, Ser 1.84 (*)     Calcium 7.1 (*)     Albumin 3.4 (*)     AST 45 (*)     ALT 38 (*)     Alkaline Phosphatase 222 (*)     GFR calc non Af Amer 27 (*)     GFR calc Af Amer 32 (*)     All other components within normal limits  LIPASE, BLOOD  URINALYSIS, MICROSCOPIC ONLY  URINALYSIS, ROUTINE W REFLEX MICROSCOPIC   Ct Abdomen Pelvis W Contrast  12/18/2012  *RADIOLOGY REPORT*  Clinical Data: Restaging carcinoid tumor  CT ABDOMEN AND PELVIS WITH CONTRAST  Technique:  Multidetector CT imaging of the abdomen and pelvis was performed following the standard protocol during bolus administration of intravenous contrast.  Contrast: OMNIPAQUE IOHEXOL 300 MG/ML  SOLN  Comparison: 06/26/2012  Findings:  Lung bases:  There is no pericardial or pleural effusion.  Areas of subpleural ground-glass attenuation are noted within the right middle lobe and there are patchy areas of ground-glass attenuation within both lower lobes.  Abdomen/pelvis: There is marked diffuse low attenuation throughout the liver consistent with hepatic steatosis.  Nodule in the right hepatic lobe measures 5 mm, image 41.  This appears new since the previous exam.  Subcapsular nodule in the posterior inferior right hepatic lobe is stable measuring 8 mm.  Peripheral subcapsular nodule within the anterior right hepatic lobe measures 4 mm, image 31. Previously 5 mm.  Subcapsular tumor overlying the anterior right hepatic lobe measures 1.1 x 4.0 cm, image 39.  Previously this measured 0.6 x 3.6 cm.  Hyperattenuating subcapsular nodule overlying the inferior posterior right hepatic lobe measures 1.6 x 0.5 cm, image 50.  New from previous study.  Previous cholecystectomy.  There is no biliary dilatation.  The pancreas is unremarkable.  The spleen is normal.  Normal appearance of the adrenal glands.  The right kidney appears normal.  Cyst is identified within the inferior pole the left kidney.  The urinary bladder appears normal for degree of distention.  Previous hysterectomy.  Cystic and solid mass within the right side of pelvis is again noted.  This measures 7.6 x 6.6 cm, image 102.  Previously this measured 7.9 x 6.0 cm.  Fat and soft tissue density mass in the left adnexa measures 4.7 x 3.8 cm, image 107.  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  Partially calcified soft tissue lesion inferior to the pancreatic neck measures 1.6 cm, image 74.  Previously 1.3 cm.  Additional peritoneal lesion is identified centrally, image 86.  This measures 2.7 cm.  Previously 1.7 cm.  Retroperitoneal adenopathy measures up to 1.3 cm in the low aortocaval station, image 74.  Previously 1.1 cm.  Omental nodularity is again noted.  Index nodule measures 1.5 x 2.0 cm, image 90.  This is unchanged from previous exam.  Proximal small bowel loops are increased in caliber.  The bowel loops appear tethered within the right lower quadrant of the abdomen, image 90.   which represents a transition to relatively decreased caliber distal small bowel and colon.  Bones/Musculoskeletal:  Review of the visualized bony structures demonstrates no worrisome lytic or sclerotic bone lesions.  IMPRESSION:  1. Stomach and the small bowel loops are abnormally increased in caliber which are worrisome for a bowel obstruction.  The point of obstruction appears to be within the right lower quadrant of the abdomen with the bowel loops appear "tethered."  Careful clinical correlation is advised. 2.  Mild progression of the hypervascular liver metastasis which predominately involves the subcapsular liver. 3.  Stable appearance of peritoneal and omental disease. 3.  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  These results will be called to the ordering clinician or representative by the Radiologist Assistant, and communication documented in the PACS Dashboard.   Original Report Authenticated By: Signa Kell, M.D.    Dg Abd 2 Views  12/19/2012  *RADIOLOGY REPORT*  Clinical Data: Small bowel obstruction.  Nausea vomiting.  ABDOMEN - 2 VIEW  Comparison: 12/11/2012  Findings: Scattered air fluid levels are seen but no significantly dilated bowel loops are visualized on today's exam.  A small amount of residual oral contrast is seen in nondilated bowel loops, and residual contrast from recent CT is seen in the urinary tract.  No evidence of free intraperitoneal air.  IMPRESSION: Scattered air fluid levels, without gaseous dilatation of bowel or free air.   Original Report Authenticated By: Myles Rosenthal, M.D.    I personally reviewed the imaging tests through PACS system I reviewed available ER/hospitalization records through the EMR   1. Partial small bowel obstruction       MDM  4:58 PM The patient seems to be feeling better at this time.  I spoke with general surgery who agrees that there is nothing surgical at this time.  The patient will be admitted to the hospitalist service for symptomatic treatment of what appears to be either ileus or partial small bowel obstruction.  She does have some renal failure and hypokalemia.       Lyanne Co, MD 12/19/12 [phone]

## 2012-12-19 NOTE — Telephone Encounter (Signed)
Dr. Truett Perna unsuccessful last night in reaching patient regarding CT results. Has small bowel obstruction and needs to be seen in next 2 days by Dr. Ezzard Standing or Rosen[place]ower if she can keep down fluids. If not, she needs to go to emergency room for admission by hospitalist with surgeon consult. Roddie Mc reports she has not been able to keep any po liquids down and agrees to admission.

## 2012-12-19 NOTE — ED Notes (Signed)
Pt was sent by Dr. Myrle Sheng.  Pt has carcinoid cancer in abdomen.  Has an ileostomy.  Pt has had NV x 1.5 months.  Has had several scans done and yesterday, pt was informed that she has a SBO.  Was sent here today by doctors office.  NAD.  VSS.

## 2012-12-19 NOTE — ED Notes (Signed)
Not enough urine in lab to perform UA. Needs recollecting.

## 2012-12-19 NOTE — ED Notes (Signed)
Patient reports feeling shaky and weakness.  Denies pain but is nauseated now.

## 2012-12-19 NOTE — ED Notes (Signed)
Report called to floor

## 2012-12-20 ENCOUNTER — Inpatient Hospital Stay (HOSPITAL_COMMUNITY): Payer: Medicare Other

## 2012-12-20 DIAGNOSIS — K56609 Unspecified intestinal obstruction, unspecified as to partial versus complete obstruction: Secondary | ICD-10-CM

## 2012-12-20 DIAGNOSIS — C787 Secondary malignant neoplasm of liver and intrahepatic bile duct: Secondary | ICD-10-CM

## 2012-12-20 DIAGNOSIS — C7A012 Malignant carcinoid tumor of the ileum: Secondary | ICD-10-CM

## 2012-12-20 LAB — URINALYSIS, ROUTINE W REFLEX MICROSCOPIC
Bilirubin Urine: NEGATIVE
Ketones, ur: 15 mg/dL — AB
Nitrite: NEGATIVE
Protein, ur: NEGATIVE mg/dL
Specific Gravity, Urine: 1.016 (ref 1.005–1.030)
Urobilinogen, UA: 0.2 mg/dL (ref 0.0–1.0)

## 2012-12-20 LAB — BASIC METABOLIC PANEL
BUN: 14 mg/dL (ref 6–23)
BUN: 10 mg/dL (ref 6–23)
Calcium: 6 mg/dL — CL (ref 8.4–10.5)
Chloride: 102 mEq/L (ref 96–112)
Creatinine, Ser: 1.25 mg/dL — ABNORMAL HIGH (ref 0.50–1.10)
Creatinine, Ser: 0.83 mg/dL (ref 0.50–1.10)
GFR calc Af Amer: 51 mL/min — ABNORMAL LOW (ref >90)
GFR calc Af Amer: 83 mL/min — ABNORMAL LOW (ref >90)
Glucose, Bld: 69 mg/dL — ABNORMAL LOW (ref 70–99)
Potassium: 2.9 mEq/L — ABNORMAL LOW (ref 3.5–5.1)

## 2012-12-20 LAB — SODIUM, URINE, RANDOM: Sodium, Ur: 17 mEq/L

## 2012-12-20 LAB — CBC
MCHC: 32.7 g/dL (ref 30.0–36.0)
MCV: 90.3 fL (ref 78.0–100.0)
Platelets: 219 10*3/uL (ref 150–400)
RDW: 13.8 % (ref 11.5–15.5)
WBC: 9.1 10*3/uL (ref 4.0–10.5)

## 2012-12-20 LAB — CALCIUM, URINE, RANDOM: Calcium, Ur: <1 mg/dL

## 2012-12-20 LAB — URINE MICROSCOPIC-ADD ON

## 2012-12-20 LAB — TSH: TSH: 1.581 u[IU]/mL (ref 0.350–4.500)

## 2012-12-20 LAB — GLUCOSE, CAPILLARY: Glucose-Capillary: 87 mg/dL (ref 70–99)

## 2012-12-20 MED ORDER — POTASSIUM CHLORIDE 10 MEQ/100ML IV SOLN
10.0000 meq | INTRAVENOUS | Status: AC
  Administered 2012-12-20 (×4): 10 meq via INTRAVENOUS
  Filled 2012-12-20 (×4): qty 100

## 2012-12-20 MED ORDER — TRACE MINERALS CR-CU-F-FE-I-MN-MO-SE-ZN IV SOLN
INTRAVENOUS | Status: AC
  Administered 2012-12-20: 18:00:00 via INTRAVENOUS
  Filled 2012-12-20: qty 1000

## 2012-12-20 MED ORDER — MORPHINE SULFATE 2 MG/ML IJ SOLN
2.0000 mg | INTRAMUSCULAR | Status: DC | PRN
  Administered 2012-12-20 (×2): 4 mg via INTRAVENOUS
  Administered 2012-12-22: 2 mg via INTRAVENOUS
  Filled 2012-12-20: qty 2
  Filled 2012-12-20: qty 1
  Filled 2012-12-20: qty 2

## 2012-12-20 MED ORDER — MAGNESIUM SULFATE 40 MG/ML IJ SOLN
2.0000 g | Freq: Once | INTRAMUSCULAR | Status: AC
  Administered 2012-12-20: 2 g via INTRAVENOUS
  Filled 2012-12-20: qty 50

## 2012-12-20 MED ORDER — SODIUM CHLORIDE 0.9 % IV SOLN
1.0000 g | Freq: Once | INTRAVENOUS | Status: AC
  Administered 2012-12-20: 1 g via INTRAVENOUS
  Filled 2012-12-20: qty 10

## 2012-12-20 MED ORDER — INSULIN ASPART 100 UNIT/ML ~~LOC~~ SOLN
0.0000 [IU] | Freq: Four times a day (QID) | SUBCUTANEOUS | Status: DC
  Administered 2012-12-21 (×2): 2 [IU] via SUBCUTANEOUS

## 2012-12-20 MED ORDER — FAT EMULSION 20 % IV EMUL
250.0000 mL | INTRAVENOUS | Status: AC
  Administered 2012-12-20: 250 mL via INTRAVENOUS
  Filled 2012-12-20: qty 250

## 2012-12-20 MED ORDER — MENTHOL 3 MG MT LOZG
1.0000 | LOZENGE | OROMUCOSAL | Status: DC | PRN
  Filled 2012-12-20 (×3): qty 9

## 2012-12-20 MED ORDER — SODIUM CHLORIDE 0.9 % IJ SOLN
10.0000 mL | INTRAMUSCULAR | Status: DC | PRN
  Administered 2012-12-26: 10 mL

## 2012-12-20 MED ORDER — POTASSIUM CHLORIDE IN NACL 20-0.9 MEQ/L-% IV SOLN
INTRAVENOUS | Status: AC
  Administered 2012-12-20: 18:00:00 via INTRAVENOUS
  Filled 2012-12-20 (×2): qty 1000

## 2012-12-20 MED ORDER — PHENOL 1.4 % MT LIQD
1.0000 | OROMUCOSAL | Status: DC | PRN
  Filled 2012-12-20: qty 177

## 2012-12-20 MED ORDER — POTASSIUM CHLORIDE 10 MEQ/100ML IV SOLN
10.0000 meq | INTRAVENOUS | Status: AC
  Administered 2012-12-20 (×3): 10 meq via INTRAVENOUS
  Filled 2012-12-20 (×3): qty 100

## 2012-12-20 MED ORDER — SODIUM CHLORIDE 0.9 % IV SOLN
INTRAVENOUS | Status: DC
Start: 2012-12-20 — End: 2012-12-20
  Administered 2012-12-20: 12:00:00 via INTRAVENOUS

## 2012-12-20 NOTE — Consult Note (Signed)
Reason for Consult:  Partial small bowel obstruction Referring Physician:   Dr. Desma Paganini Lori Dennis is an 67 y.o. female.  HPI: She has known widely metastatic intraperitoneal carcinoid. She has had slow progression of disease and is on treatment. She's been developing more problems with nausea and vomiting and signs consistent with a small bowel obstruction. A recent CT demonstrated that as well as mild progression of her disease and some dilation thickening of bowel in the right lower quadrant region. She was admitted to the hospital last night because of small bowel obstruction felt to be secondary to progression of her carcinoid. She has had some colostomy output but not much today. She has not vomited since last night. She has had not had any significant oral intake for a week.  Her past history is notable for undergoing an exploratory laparotomy with a sigmoid colectomy and descending colostomy, extended right colectomy, and gastrojejunostomy for widely metastatic intraperitoneal carcinoid tumor 04/02/2008. On 06/08/2010 she developed a small bowel obstruction and underwent an exploratory laparotomy with resection of ileum and ileal transverse anastomosis and an enteroenterostomy in the jejunal area to bypass a malignant obstruction. She's been maintained on some chemotherapy intermittently since that time but her symptoms have slowly progressed.  Past Medical History  Diagnosis Date  . Cancer 02/19/2008    Metastatic carcinoid tumor  . Hypertension   . Complication of anesthesia     slow to wake up    Past Surgical History  Procedure Date  . Right colectomy 04/02/2008    with descending colostomy & gastrojejunostomy  . Vaginal hysterectomy     at age 5  . Cholecystectomy     age 43    History reviewed. No pertinent family history.  Social History:  reports that she has never smoked. She has never used smokeless tobacco. She reports that she does not drink alcohol or use  illicit drugs.  Allergies: No Known Allergies  Prior to Admission medications   Medication Sig Start Date End Date Taking? Authorizing Provider  amLODipine (NORVASC) 10 MG tablet Take 10 mg by mouth daily.   Yes Historical Provider, MD  Cholecalciferol (VITAMIN D3) 1000 UNITS CAPS Take 1 capsule by mouth daily.     Yes Historical Provider, MD  LOSARTAN POTASSIUM PO Take 100 mg by mouth daily.   Yes Historical Provider, MD  metoCLOPramide (REGLAN) 10 MG tablet Take 1 tablet (10 mg total) by mouth 3 (three) times daily before meals. 12/11/12  Yes Ladene Artist, MD  Multiple Vitamin (MULTIVITAMIN) capsule Take 1 capsule by mouth daily.     Yes Historical Provider, MD  Octreotide Acetate (SANDOSTATIN IJ) Inject as directed. Pt gets this injection every 3 weeks. Cant confirm what the dosage is. Followed by Dr Alcide Evener   Yes Historical Provider, MD  ondansetron (ZOFRAN) 8 MG tablet Take 1 tablet (8 mg total) by mouth every 12 (twelve) hours as needed for nausea. 12/07/12  Yes Ladene Artist, MD  pantoprazole (PROTONIX) 40 MG tablet Take 1 tablet (40 mg total) by mouth daily. 12/11/12  Yes Ladene Artist, MD  promethazine (PHENERGAN) 12.5 MG tablet Take 1/2 - 1 tablet PO Q6h prn nausea 06/07/12  Yes Ladene Artist, MD     Results for orders placed during the hospital encounter of 12/19/12 (from the past 48 hour(s))  CBC WITH DIFFERENTIAL     Status: Abnormal   Collection Time   12/19/12  2:30 PM      Component Value  Range Comment   WBC 12.7 (*) 4.0 - 10.5 K/uL    RBC 4.71  3.87 - 5.11 MIL/uL    Hemoglobin 14.3  12.0 - 15.0 g/dL    HCT 16.1  09.6 - 04.5 %    MCV 88.3  78.0 - 100.0 fL    MCH 30.4  26.0 - 34.0 pg    MCHC 34.4  30.0 - 36.0 g/dL    RDW 40.9  81.1 - 91.4 %    Platelets 310  150 - 400 K/uL    Neutrophils Relative 86 (*) 43 - 77 %    Neutro Abs 10.9 (*) 1.7 - 7.7 K/uL    Lymphocytes Relative 9 (*) 12 - 46 %    Lymphs Abs 1.1  0.7 - 4.0 K/uL    Monocytes Relative 5  3 - 12 %      Monocytes Absolute 0.6  0.1 - 1.0 K/uL    Eosinophils Relative 0  0 - 5 %    Eosinophils Absolute 0.0  0.0 - 0.7 K/uL    Basophils Relative 0  0 - 1 %    Basophils Absolute 0.0  0.0 - 0.1 K/uL   COMPREHENSIVE METABOLIC PANEL     Status: Abnormal   Collection Time   12/19/12  2:30 PM      Component Value Range Comment   Sodium 133 (*) 135 - 145 mEq/L    Potassium 3.2 (*) 3.5 - 5.1 mEq/L    Chloride 90 (*) 96 - 112 mEq/L    CO2 25  19 - 32 mEq/L    Glucose, Bld 136 (*) 70 - 99 mg/dL    BUN 18  6 - 23 mg/dL    Creatinine, Ser 7.82 (*) 0.50 - 1.10 mg/dL    Calcium 7.1 (*) 8.4 - 10.5 mg/dL    Total Protein 7.7  6.0 - 8.3 g/dL    Albumin 3.4 (*) 3.5 - 5.2 g/dL    AST 45 (*) 0 - 37 U/L    ALT 38 (*) 0 - 35 U/L    Alkaline Phosphatase 222 (*) 39 - 117 U/L    Total Bilirubin 0.9  0.3 - 1.2 mg/dL    GFR calc non Af Amer 27 (*) >90 mL/min    GFR calc Af Amer 32 (*) >90 mL/min   LIPASE, BLOOD     Status: Normal   Collection Time   12/19/12  2:30 PM      Component Value Range Comment   Lipase 28  11 - 59 U/L   HEPATIC FUNCTION PANEL     Status: Abnormal   Collection Time   12/19/12  7:10 PM      Component Value Range Comment   Total Protein 7.9  6.0 - 8.3 g/dL    Albumin 3.5  3.5 - 5.2 g/dL    AST 47 (*) 0 - 37 U/L    ALT 38 (*) 0 - 35 U/L    Alkaline Phosphatase 227 (*) 39 - 117 U/L    Total Bilirubin 0.8  0.3 - 1.2 mg/dL    Bilirubin, Direct 0.3  0.0 - 0.3 mg/dL    Indirect Bilirubin 0.5  0.3 - 0.9 mg/dL   MAGNESIUM     Status: Abnormal   Collection Time   12/19/12  7:10 PM      Component Value Range Comment   Magnesium 0.7 (*) 1.5 - 2.5 mg/dL   PHOSPHORUS     Status: Normal  Collection Time   12/19/12  7:10 PM      Component Value Range Comment   Phosphorus 4.6  2.3 - 4.6 mg/dL   TSH     Status: Normal   Collection Time   12/19/12  7:10 PM      Component Value Range Comment   TSH 1.581  0.350 - 4.500 uIU/mL   URINALYSIS, ROUTINE W REFLEX MICROSCOPIC     Status:  Abnormal   Collection Time   12/20/12  3:56 AM      Component Value Range Comment   Color, Urine YELLOW  YELLOW    APPearance CLEAR  CLEAR    Specific Gravity, Urine 1.016  1.005 - 1.030    pH 5.5  5.0 - 8.0    Glucose, UA NEGATIVE  NEGATIVE mg/dL    Hgb urine dipstick TRACE (*) NEGATIVE    Bilirubin Urine NEGATIVE  NEGATIVE    Ketones, ur 15 (*) NEGATIVE mg/dL    Protein, ur NEGATIVE  NEGATIVE mg/dL    Urobilinogen, UA 0.2  0.0 - 1.0 mg/dL    Nitrite NEGATIVE  NEGATIVE    Leukocytes, UA NEGATIVE  NEGATIVE   SODIUM, URINE, RANDOM     Status: Normal   Collection Time   12/20/12  3:56 AM      Component Value Range Comment   Sodium, Ur 17     CALCIUM, URINE, RANDOM     Status: Normal   Collection Time   12/20/12  3:56 AM      Component Value Range Comment   Calcium, Ur <1     URINE MICROSCOPIC-ADD ON     Status: Normal   Collection Time   12/20/12  3:56 AM      Component Value Range Comment   Squamous Epithelial / LPF RARE  RARE    WBC, UA 0-2  <3 WBC/hpf    RBC / HPF 0-2  <3 RBC/hpf    Bacteria, UA RARE  RARE   BASIC METABOLIC PANEL     Status: Abnormal   Collection Time   12/20/12  4:40 AM      Component Value Range Comment   Sodium 136  135 - 145 mEq/L    Potassium 2.9 (*) 3.5 - 5.1 mEq/L    Chloride 100  96 - 112 mEq/L DELTA CHECK NOTED   CO2 24  19 - 32 mEq/L    Glucose, Bld 97  70 - 99 mg/dL    BUN 14  6 - 23 mg/dL    Creatinine, Ser 4.54 (*) 0.50 - 1.10 mg/dL    Calcium 6.0 (*) 8.4 - 10.5 mg/dL    GFR calc non Af Amer 44 (*) >90 mL/min    GFR calc Af Amer 51 (*) >90 mL/min   CBC     Status: Abnormal   Collection Time   12/20/12  4:40 AM      Component Value Range Comment   WBC 9.1  4.0 - 10.5 K/uL    RBC 4.03  3.87 - 5.11 MIL/uL    Hemoglobin 11.9 (*) 12.0 - 15.0 g/dL    HCT 09.8  11.9 - 14.7 %    MCV 90.3  78.0 - 100.0 fL    MCH 29.5  26.0 - 34.0 pg    MCHC 32.7  30.0 - 36.0 g/dL    RDW 82.9  56.2 - 13.0 %    Platelets 219  150 - 400 K/uL     Abd 1 View  (  kub)  12/20/2012  *RADIOLOGY REPORT*  Clinical Data: Bowel obstruction.  ABDOMEN - 1 VIEW  Comparison: 12/19/2012  Findings: Small bowel and colon are now completely decompressed and show no residual signs of obstruction or ileus.  Minimal amount of residual oral contrast material remains in the region of the splenic flexure of the colon.  IMPRESSION: Complete resolution of bowel obstruction.   Original Report Authenticated By: Irish Lack, M.D.    Ct Abdomen Pelvis W Contrast  12/18/2012  *RADIOLOGY REPORT*  Clinical Data: Restaging carcinoid tumor  CT ABDOMEN AND PELVIS WITH CONTRAST  Technique:  Multidetector CT imaging of the abdomen and pelvis was performed following the standard protocol during bolus administration of intravenous contrast.  Contrast: OMNIPAQUE IOHEXOL 300 MG/ML  SOLN  Comparison: 06/26/2012  Findings:  Lung bases:  There is no pericardial or pleural effusion.  Areas of subpleural ground-glass attenuation are noted within the right middle lobe and there are patchy areas of ground-glass attenuation within both lower lobes.  Abdomen/pelvis: There is marked diffuse low attenuation throughout the liver consistent with hepatic steatosis.  Nodule in the right hepatic lobe measures 5 mm, image 41.  This appears new since the previous exam.  Subcapsular nodule in the posterior inferior right hepatic lobe is stable measuring 8 mm.  Peripheral subcapsular nodule within the anterior right hepatic lobe measures 4 mm, image 31. Previously 5 mm.  Subcapsular tumor overlying the anterior right hepatic lobe measures 1.1 x 4.0 cm, image 39.  Previously this measured 0.6 x 3.6 cm.  Hyperattenuating subcapsular nodule overlying the inferior posterior right hepatic lobe measures 1.6 x 0.5 cm, image 50.  New from previous study.  Previous cholecystectomy.  There is no biliary dilatation.  The pancreas is unremarkable.  The spleen is normal.  Normal appearance of the adrenal glands.  The right kidney  appears normal.  Cyst is identified within the inferior pole the left kidney.  The urinary bladder appears normal for degree of distention.  Previous hysterectomy.  Cystic and solid mass within the right side of pelvis is again noted.  This measures 7.6 x 6.6 cm, image 102.  Previously this measured 7.9 x 6.0 cm.  Fat and soft tissue density mass in the left adnexa measures 4.7 x 3.8 cm, image 107.  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  Partially calcified soft tissue lesion inferior to the pancreatic neck measures 1.6 cm, image 74.  Previously 1.3 cm.  Additional peritoneal lesion is identified centrally, image 86.  This measures 2.7 cm.  Previously 1.7 cm.  Retroperitoneal adenopathy measures up to 1.3 cm in the low aortocaval station, image 74.  Previously 1.1 cm.  Omental nodularity is again noted.  Index nodule measures 1.5 x 2.0 cm, image 90.  This is unchanged from previous exam.  Proximal small bowel loops are increased in caliber.  The bowel loops appear tethered within the right lower quadrant of the abdomen, image 90.   which represents a transition to relatively decreased caliber distal small bowel and colon.  Bones/Musculoskeletal:  Review of the visualized bony structures demonstrates no worrisome lytic or sclerotic bone lesions.  IMPRESSION:  1. Stomach and the small bowel loops are abnormally increased in caliber which are worrisome for a bowel obstruction.  The point of obstruction appears to be within the right lower quadrant of the abdomen with the bowel loops appear "tethered."  Careful clinical correlation is advised. 2.  Mild  progression of the hypervascular liver metastasis which predominately involves the subcapsular liver. 3.  Stable appearance of peritoneal and omental disease. 3.  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  These results will be called to the ordering  clinician or representative by the Radiologist Assistant, and communication documented in the PACS Dashboard.   Original Report Authenticated By: Signa Kell, M.D.    Dg Abd 2 Views  12/19/2012  *RADIOLOGY REPORT*  Clinical Data: Small bowel obstruction.  Nausea vomiting.  ABDOMEN - 2 VIEW  Comparison: 12/11/2012  Findings: Scattered air fluid levels are seen but no significantly dilated bowel loops are visualized on today's exam.  A small amount of residual oral contrast is seen in nondilated bowel loops, and residual contrast from recent CT is seen in the urinary tract.  No evidence of free intraperitoneal air.  IMPRESSION: Scattered air fluid levels, without gaseous dilatation of bowel or free air.   Original Report Authenticated By: Myles Rosenthal, M.D.     Review of Systems  Constitutional: Positive for weight loss.  Gastrointestinal: Positive for nausea, vomiting and abdominal pain.  Genitourinary:       Decreased urine output.   Blood pressure 108/68, pulse 76, temperature 98 F (36.7 C), temperature source Oral, resp. rate 18, height 5\' 3"  (1.6 m), weight 148 lb 13 oz (67.5 kg), SpO2 96.00%. Physical Exam  Constitutional: She appears well-developed and well-nourished. No distress.  HENT:       Mucous membranes are dry.  Eyes: No scleral icterus.  GI: Soft. She exhibits no distension and no mass. There is no tenderness.       Midline scar is noted with no hernia. Right subcostal scar is noted. Left-sided colostomy is viable with nothing in the colostomy bag. Hypoactive bowel sounds are noted.  Skin: Skin is warm and dry.  Psychiatric: She has a normal mood and affect. Her behavior is normal.    Assessment/Plan: Progressive small bowel junction most likely due to intraperitoneal carcinoid tumor versus adhesions. She's been operated on twice previously for something like this. I had a long discussion with her and her daughters about options for management. First option would be  palliative care and hospice.  The second option would be to insert an NG tube and start her on nutritional support intravenously. He she failed to progress and an exploratory laparotomy could be attempted with the possible intestinal bypass or if it was an adhesive based process obstruction lysis of adhesions (although I favor this is due to her malignant process).  They all realize that given the extent of her disease, surgery may be attempted but nothing may be able to be done. They realize the complications of surgery are greater in this situation.  Plan: Will insert nasogastric tube for decompression purposes. We'll start a PICC line and start parenteral nutrition. Repeat abdominal x-rays. If she has failed to improve an exploratory laparotomy would be a last resort and they understand this.  Lori Dennis J 12/20/2012, 12:53 PM

## 2012-12-20 NOTE — Progress Notes (Signed)
Subjective: Still with nausea KUB pending today  Objective: Vital signs in last 24 hours: Temp:  [98 F (36.7 C)-98.6 F (37 C)] 98 F (36.7 C) (01/01 0521) Pulse Rate:  [76-87] 76  (01/01 0521) Resp:  [16-18] 18  (01/01 0521) BP: (108-139)/(68-76) 108/68 mmHg (01/01 0521) SpO2:  [96 %-100 %] 96 % (01/01 0521) Weight:  [67.5 kg (148 lb 13 oz)-69.1 kg (152 lb 5.4 oz)] 67.5 kg (148 lb 13 oz) (12/31 2005) Weight change:  Last BM Date: 12/20/12  Intake/Output from previous day: 12/31 0701 - 01/01 0700 In: 797.1 [P.O.:120; I.V.:677.1] Out: 400 [Urine:400] Intake/Output this shift: Total I/O In: -  Out: 300 [Urine:300]  General appearance: alert Resp: clear to auscultation bilaterally GI: ostemy output some- BS decrease  Lab Results:  Basename 12/20/12 0440 12/19/12 1430  WBC 9.1 12.7*  HGB 11.9* 14.3  HCT 36.4 41.6  PLT 219 310   BMET  Basename 12/20/12 0440 12/19/12 1430  NA 136 133*  K 2.9* 3.2*  CL 100 90*  CO2 24 25  GLUCOSE 97 136*  BUN 14 18  CREATININE 1.25* 1.84*  CALCIUM 6.0* 7.1*    Studies/Results: Ct Abdomen Pelvis W Contrast  12/18/2012  *RADIOLOGY REPORT*  Clinical Data: Restaging carcinoid tumor  CT ABDOMEN AND PELVIS WITH CONTRAST  Technique:  Multidetector CT imaging of the abdomen and pelvis was performed following the standard protocol during bolus administration of intravenous contrast.  Contrast: OMNIPAQUE IOHEXOL 300 MG/ML  SOLN  Comparison: 06/26/2012  Findings:  Lung bases:  There is no pericardial or pleural effusion.  Areas of subpleural ground-glass attenuation are noted within the right middle lobe and there are patchy areas of ground-glass attenuation within both lower lobes.  Abdomen/pelvis: There is marked diffuse low attenuation throughout the liver consistent with hepatic steatosis.  Nodule in the right hepatic lobe measures 5 mm, image 41.  This appears new since the previous exam.  Subcapsular nodule in the posterior inferior  right hepatic lobe is stable measuring 8 mm.  Peripheral subcapsular nodule within the anterior right hepatic lobe measures 4 mm, image 31. Previously 5 mm.  Subcapsular tumor overlying the anterior right hepatic lobe measures 1.1 x 4.0 cm, image 39.  Previously this measured 0.6 x 3.6 cm.  Hyperattenuating subcapsular nodule overlying the inferior posterior right hepatic lobe measures 1.6 x 0.5 cm, image 50.  New from previous study.  Previous cholecystectomy.  There is no biliary dilatation.  The pancreas is unremarkable.  The spleen is normal.  Normal appearance of the adrenal glands.  The right kidney appears normal.  Cyst is identified within the inferior pole the left kidney.  The urinary bladder appears normal for degree of distention.  Previous hysterectomy.  Cystic and solid mass within the right side of pelvis is again noted.  This measures 7.6 x 6.6 cm, image 102.  Previously this measured 7.9 x 6.0 cm.  Fat and soft tissue density mass in the left adnexa measures 4.7 x 3.8 cm, image 107.  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  Partially calcified soft tissue lesion inferior to the pancreatic neck measures 1.6 cm, image 74.  Previously 1.3 cm.  Additional peritoneal lesion is identified centrally, image 86.  This measures 2.7 cm.  Previously 1.7 cm.  Retroperitoneal adenopathy measures up to 1.3 cm in the low aortocaval station, image 74.  Previously 1.1 cm.  Omental nodularity is again noted.  Index  nodule measures 1.5 x 2.0 cm, image 90.  This is unchanged from previous exam.  Proximal small bowel loops are increased in caliber.  The bowel loops appear tethered within the right lower quadrant of the abdomen, image 90.   which represents a transition to relatively decreased caliber distal small bowel and colon.  Bones/Musculoskeletal:  Review of the visualized bony structures demonstrates no worrisome lytic or sclerotic bone lesions.  IMPRESSION:   1. Stomach and the small bowel loops are abnormally increased in caliber which are worrisome for a bowel obstruction.  The point of obstruction appears to be within the right lower quadrant of the abdomen with the bowel loops appear "tethered."  Careful clinical correlation is advised. 2.  Mild progression of the hypervascular liver metastasis which predominately involves the subcapsular liver. 3.  Stable appearance of peritoneal and omental disease. 3.  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  These results will be called to the ordering clinician or representative by the Radiologist Assistant, and communication documented in the PACS Dashboard.   Original Report Authenticated By: Signa Kell, M.D.    Dg Abd 2 Views  12/19/2012  *RADIOLOGY REPORT*  Clinical Data: Small bowel obstruction.  Nausea vomiting.  ABDOMEN - 2 VIEW  Comparison: 12/11/2012  Findings: Scattered air fluid levels are seen but no significantly dilated bowel loops are visualized on today's exam.  A small amount of residual oral contrast is seen in nondilated bowel loops, and residual contrast from recent CT is seen in the urinary tract.  No evidence of free intraperitoneal air.  IMPRESSION: Scattered air fluid levels, without gaseous dilatation of bowel or free air.   Original Report Authenticated By: Myles Rosenthal, M.D.     Medications: I have reviewed the patient's current medications.  Assessment/Plan: N/V/ P SBO NPO with Ice chip KUB metatstatic Carcinoid with incease tumor burden- Oncology consult- symptoms from tumor load Low K/ Mg- replace ARF improved with IVF Pain control   LOS: 1 day   Lori Dennis 12/20/2012, 9:09 AM

## 2012-12-20 NOTE — Progress Notes (Addendum)
PARENTERAL NUTRITION CONSULT NOTE - INITIAL  Pharmacy Consult for TNA Indication:   No Known Allergies  Patient Measurements: Height: 5\' 3"  (160 cm) Weight: 148 lb 13 oz (67.5 kg) IBW/kg (Calculated) : 52.4  Adjusted Body Weight: 57kg Dosing weight: 60 kg  Vital Signs: Temp: 98 F (36.7 C) (01/01 0521) Temp src: Oral (01/01 0521) BP: 108/68 mmHg (01/01 0521) Pulse Rate: 76  (01/01 0521) Intake/Output from previous day: 12/31 0701 - 01/01 0700 In: 797.1 [P.O.:120; I.V.:677.1] Out: 400 [Urine:400] Intake/Output from this shift: Total I/O In: -  Out: 300 [Urine:300]  Labs:  [facility] 12/20/12 0440 12/19/12 1430  WBC 9.1 12.7*  HGB 11.9* 14.3  HCT 36.4 41.6  PLT 219 310  APTT -- --  INR -- --    Basename 12/20/12 0440 12/19/12 1910 12/19/12 1430  NA 136 -- 133*  K 2.9* -- 3.2*  CL 100 -- 90*  CO2 24 -- 25  GLUCOSE 97 -- 136*  BUN 14 -- 18  CREATININE 1.25* -- 1.84*  LABCREA -- -- --  CREAT24HRUR -- -- --  CALCIUM 6.0* -- 7.1*  MG -- 0.7* --  PHOS -- 4.6 --  PROT -- 7.9 7.7  ALBUMIN -- 3.5 3.4*  AST -- 47* 45*  ALT -- 38* 38*  ALKPHOS -- 227* 222*  BILITOT -- 0.8 0.9  BILIDIR -- 0.3 --  IBILI -- 0.5 --  PREALBUMIN -- -- --  TRIG -- -- --  CHOLHDL -- -- --  CHOL -- -- --   Estimated Creatinine Clearance: 40.8 ml/min (by C-G formula based on Cr of 1.25).   No results found for this basename: GLUCAP:3 in the last 72 hours  Medical History: Past Medical History  Diagnosis Date  . Cancer 02/19/2008    Metastatic carcinoid tumor  . Hypertension   . Complication of anesthesia     slow to wake up   Medications:  Infusions:    . sodium chloride 100 mL/hr at 12/20/12 0956  . [DISCONTINUED] sodium chloride Stopped (12/19/12 1946)    Insulin Requirements in the past 24 hours:  None, no insulin ordered  Current Nutrition:  NPO, NG tube placed 1/1  Assessment: 27 yoF with hx metastatic Carcinioid tumor. Progression of disease seen on CT 12/30  (for staging) with stomach and small bowel distention. Hx of colostomy 2009.  Nausea, poor po intake x 1 week PTA  NPO with NG tube placed  TNA ordered, needs central IV access  Electrolyte imbalances: K 2.9, Ca 6.0 (with normal albumin-needs no correction)  BMET & Mag level ordered for 1600: assess K, Mag, Ca levels. Need K > 2.7 and Mag close to 1.5 to begin TNA  Electrolytes:K 2.9 this am, KCl runs x 3 ordered                     Mag 0.7 last night, 2 gm bolus last night                     Ca 6.0, 1gm Ca Gluconate today Triglycerides: Pending Renal function: ~ 40 ml/min Pre-Albumin: pending  Nutritional Goals:  1500-1800 kCal, 90 grams of protein per day Await RD recommendations (dietician consult ordered)  When TNA begins: goal rate Clinimix E 5/20 at 75 ml/hr would deliver: 90 gm protein/day, avg 1800 kcal/day (2064 kcal/day on MWF (with lipids) & 1580 other days)  Plan:   Unsure if PICC can be placed today.  Lytes abnormal, replacement ongoing  Repeat BMET and Mag level at 1600, assess feasibility or beginning TNA today if central IV access available.  Would start TNA with Clinimix E 5/20 at 6ml/hr, MVI, trace elements and Lipids today.  Would reduce IV NS to 68ml/hr and consider adding K to IV fluid.  MVI Trace elements & Lipids on MWF only due to national backorder.  Full TNA labs Mon/Thurs  Otho Bellows PharmD Pager [phone] 12/20/2012,9:56 AM

## 2012-12-20 NOTE — Progress Notes (Signed)
Orders received from MD Daphine Deutscher for chloraseptic spray and lozenge as needed Means, Myrtie Hawk RN 12-20-2012 12:04pm

## 2012-12-20 NOTE — Progress Notes (Signed)
Peripherally Inserted Central Catheter/Midline Placement  The IV Nurse has discussed with the patient and/or persons authorized to consent for the patient, the purpose of this procedure and the potential benefits and risks involved with this procedure.  The benefits include less needle sticks, lab draws from the catheter and patient may be discharged home with the catheter.  Risks include, but not limited to, infection, bleeding, blood clot (thrombus formation), and puncture of an artery; nerve damage and irregular heat beat.  Alternatives to this procedure were also discussed.  PICC/Midline Placement Documentation  PICC / Midline Double Lumen 12/20/12 PICC Left Basilic (Active)       Stacie Glaze Horton 12/20/2012, 3:35 PM

## 2012-12-20 NOTE — Progress Notes (Signed)
TNA Update  -PICC placed and plans to start as per earlier dose -Labs recheck and K and Mg remain NWG:NFAOZH IVF to NS + 20KCl at 81ml/hr (adjust rate for TPN starting), magnesium sulfate 2gm IV x 1 and KCL x 4 runs - Start CBGs and sens SSI q6h  Juliette Alcide, PharmD, BCPS.   Pager: [phone] 12/20/2012 5:26 PM

## 2012-12-20 NOTE — Progress Notes (Signed)
   Critical value received:  Calcium 6.0  Date of notification:  12/20/2012  Time of notification:  530  Critical value read back:yes  Nurse who received alert:  Garlan Fair  MD notified (1st page):  Craige Cotta   Time of first page:  532  MD notified (2nd page):  Time of second page:  Responding MD:  Craige Cotta  Time MD responded:  535

## 2012-12-20 NOTE — Consult Note (Signed)
Reason for Referral: Metastatic low grade carcinoid tumor   Chief Complaint  Patient presents with  . Nausea  . Emesis    HPI: 67 year old with Metastatic low grade carcinoid tumor, s/p 6 cycles of xeloda/temozolamide beginnning 09/06/2010.  She is also S/P SBO/SB infarction 06/08/2010, s/p resection of small bowel and colon. S/p sigmoid colectomy/colostomy, right colectomy, and gastrojejunostomy in April 2009.  She has been experiencing nausea, vomiting and difficulty keeping things down for the last week. She was advised to go to the ED. There she was found with profound dehydration, acute renal failure and hyperkalemia.   CT scan on 12/30 for staging purposes showed some progression of her disease as well distention of her stomach and small bowels.   She continued to have N/V. NG tube is in place at this time. She is not vomiting at this time, but still feels nauseated.   I was asked by Dr. Eula Listen  To comment of her current situation.    Past Medical History  Diagnosis Date  . Cancer 02/19/2008    Metastatic carcinoid tumor  . Hypertension   . Complication of anesthesia     slow to wake up  :  Past Surgical History  Procedure Date  . Right colectomy 04/02/2008    with descending colostomy & gastrojejunostomy  . Vaginal hysterectomy     at age 56  . Cholecystectomy     age 23  :  Current facility-administered medications:[COMPLETED] 0.9 %  sodium chloride infusion, , Intravenous, STAT, Lyanne Co, MD, Last Rate: 125 mL/hr at 12/20/12 0343;  0.9 %  sodium chloride infusion, , Intravenous, Continuous, Georgann Housekeeper, MD, Last Rate: 100 mL/hr at 12/20/12 0956;  acetaminophen (TYLENOL) suppository 650 mg, 650 mg, Rectal, Q6H PRN, Vassie Loll, MD acetaminophen (TYLENOL) tablet 650 mg, 650 mg, Oral, Q6H PRN, Vassie Loll, MD;  calcium gluconate 1 g in sodium chloride 0.9 % 100 mL IVPB, 1 g, Intravenous, Once, Terri L Green, PHARMD;  enoxaparin (LOVENOX) injection 30 mg,  30 mg, Subcutaneous, Q24H, Vassie Loll, MD, 30 mg at 12/19/12 2239;  hydrALAZINE (APRESOLINE) injection 5 mg, 5 mg, Intravenous, Q6H PRN, Vassie Loll, MD morphine 2 MG/ML injection 2-6 mg, 2-6 mg, Intravenous, Q2H PRN, Adolph Pollack, MD, 4 mg at 12/20/12 1010;  ondansetron [facility]) injection 4 mg, 4 mg, Intravenous, Q6H PRN, Vassie Loll, MD, 4 mg at 12/19/12 1923;  ondansetron [facility]) tablet 4 mg, 4 mg, Oral, Q6H PRN, Vassie Loll, MD;  pantoprazole (PROTONIX) injection 40 mg, 40 mg, Intravenous, Q24H, Vassie Loll, MD potassium chloride 10 mEq in 100 mL IVPB, 10 mEq, Intravenous, Q1 Hr x 3, Georgann Housekeeper, MD;  promethazine (PHENERGAN) injection 12.5-25 mg, 12.5-25 mg, Intravenous, Q6H PRN, Leda Gauze, NP, 25 mg at 12/19/12 2328:     . [COMPLETED] sodium chloride   Intravenous STAT  . calcium gluconate  1 g Intravenous Once  . enoxaparin (LOVENOX) injection  30 mg Subcutaneous Q24H  . pantoprazole (PROTONIX) IV  40 mg Intravenous Q24H  . potassium chloride  10 mEq Intravenous Q1 Hr x 3  :  No Known Allergies:  History reviewed. No pertinent family history.:  History   Social History  . Marital Status: Divorced    Spouse Name: N/A    Number of Children: N/A  . Years of Education: N/A   Occupational History  . Not on file.   Social History Main Topics  . Smoking status: Never Smoker   . Smokeless  tobacco: Never Used  . Alcohol Use: No  . Drug Use: No  . Sexually Active:    Other Topics Concern  . Not on file   Social History Narrative  . No narrative on file  :  Review of systems not obtained due to patient factors.  Exam: Patient Vitals for the past 24 hrs:  BP Temp Temp src Pulse Resp SpO2 Height Weight  12/20/12 0521 108/68 mmHg 98 F (36.7 C) Oral 76  18  96 % - -  12/19/12 2136 133/75 mmHg 98 F (36.7 C) Oral 82  18  97 % - -  12/19/12 2005 139/69 mmHg 98.6 F (37 C) Oral 84  18  96 % 5\' 3"  (1.6 m) 148 lb 13 oz (67.5 kg)  12/19/12 1909 -  - - - - - 5' 3.39" (1.61 m) 152 lb 5.4 oz (69.1 kg)  12/19/12 1416 125/76 mmHg 98.4 F (36.9 C) Oral 87  16  100 % - -   General appearance: alert and cooperative Head: Normocephalic, without obvious abnormality, atraumatic Eyes: conjunctivae/corneas clear. PERRL, EOM's intact. Fundi benign. Nose: Nares normal. Septum midline. Mucosa normal. No drainage or sinus tenderness. Neck: no adenopathy, no carotid bruit, no JVD, supple, symmetrical, trachea midline and thyroid not enlarged, symmetric, no tenderness/mass/nodules Resp: clear to auscultation bilaterally Chest wall: no tenderness Cardio: regular rate and rhythm, S1, S2 normal, no murmur, click, rub or gallop GI: abnormal findings:  distended and hyperactive bowel sounds Extremities: extremities normal, atraumatic, no cyanosis or edema   Basename 12/20/12 0440 12/19/12 1430  WBC 9.1 12.7*  HGB 11.9* 14.3  HCT 36.4 41.6  PLT 219 310    Basename 12/20/12 0440 12/19/12 1430  NA 136 133*  K 2.9* 3.2*  CL 100 90*  CO2 24 25  GLUCOSE 97 136*  BUN 14 18  CREATININE 1.25* 1.84*  CALCIUM 6.0* 7.1*   Lab Results  Component Value Date   WBC 9.1 12/20/2012   HGB 11.9* 12/20/2012   HCT 36.4 12/20/2012   MCV 90.3 12/20/2012   PLT 219 12/20/2012    Lab 12/20/12 0440 12/19/12 1910  NA 136 --  K 2.9* --  CL 100 --  CO2 24 --  BUN 14 --  CREATININE 1.25* --  CALCIUM 6.0* --  PROT -- 7.9  BILITOT -- 0.8  ALKPHOS -- 227*  ALT -- 38*  AST -- 47*  GLUCOSE 97 --    Abd 1 View (kub)  12/20/2012  *RADIOLOGY REPORT*  Clinical Data: Bowel obstruction.  ABDOMEN - 1 VIEW  Comparison: 12/19/2012  Findings: Small bowel and colon are now completely decompressed and show no residual signs of obstruction or ileus.  Minimal amount of residual oral contrast material remains in the region of the splenic flexure of the colon.  IMPRESSION: Complete resolution of bowel obstruction.   Original Report Authenticated By: Irish Lack, M.D.    Ct Abdomen  Pelvis W Contrast  12/18/2012  *RADIOLOGY REPORT*  Clinical Data: Restaging carcinoid tumor  CT ABDOMEN AND PELVIS WITH CONTRAST  Technique:  Multidetector CT imaging of the abdomen and pelvis was performed following the standard protocol during bolus administration of intravenous contrast.  Contrast: OMNIPAQUE IOHEXOL 300 MG/ML  SOLN  Comparison: 06/26/2012  Findings:  Lung bases:  There is no pericardial or pleural effusion.  Areas of subpleural ground-glass attenuation are noted within the right middle lobe and there are patchy areas of ground-glass attenuation within both lower lobes.  Abdomen/pelvis: There  is marked diffuse low attenuation throughout the liver consistent with hepatic steatosis.  Nodule in the right hepatic lobe measures 5 mm, image 41.  This appears new since the previous exam.  Subcapsular nodule in the posterior inferior right hepatic lobe is stable measuring 8 mm.  Peripheral subcapsular nodule within the anterior right hepatic lobe measures 4 mm, image 31. Previously 5 mm.  Subcapsular tumor overlying the anterior right hepatic lobe measures 1.1 x 4.0 cm, image 39.  Previously this measured 0.6 x 3.6 cm.  Hyperattenuating subcapsular nodule overlying the inferior posterior right hepatic lobe measures 1.6 x 0.5 cm, image 50.  New from previous study.  Previous cholecystectomy.  There is no biliary dilatation.  The pancreas is unremarkable.  The spleen is normal.  Normal appearance of the adrenal glands.  The right kidney appears normal.  Cyst is identified within the inferior pole the left kidney.  The urinary bladder appears normal for degree of distention.  Previous hysterectomy.  Cystic and solid mass within the right side of pelvis is again noted.  This measures 7.6 x 6.6 cm, image 102.  Previously this measured 7.9 x 6.0 cm.  Fat and soft tissue density mass in the left adnexa measures 4.7 x 3.8 cm, image 107.  This is compared with 4.7 x 3.7 cm previously presumably this  represents a left ovarian dermoid.  Partially calcified soft tissue lesion inferior to the pancreatic neck measures 1.6 cm, image 74.  Previously 1.3 cm.  Additional peritoneal lesion is identified centrally, image 86.  This measures 2.7 cm.  Previously 1.7 cm.  Retroperitoneal adenopathy measures up to 1.3 cm in the low aortocaval station, image 74.  Previously 1.1 cm.  Omental nodularity is again noted.  Index nodule measures 1.5 x 2.0 cm, image 90.  This is unchanged from previous exam.  Proximal small bowel loops are increased in caliber.  The bowel loops appear tethered within the right lower quadrant of the abdomen, image 90.   which represents a transition to relatively decreased caliber distal small bowel and colon.  Bones/Musculoskeletal:  Review of the visualized bony structures demonstrates no worrisome lytic or sclerotic bone lesions.  IMPRESSION:  1. Stomach and the small bowel loops are abnormally increased in caliber which are worrisome for a bowel obstruction.  The point of obstruction appears to be within the right lower quadrant of the abdomen with the bowel loops appear "tethered."  Careful clinical correlation is advised. 2.  Mild progression of the hypervascular liver metastasis which predominately involves the subcapsular liver. 3.  Stable appearance of peritoneal and omental disease. 3.  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  These results will be called to the ordering clinician or representative by the Radiologist Assistant, and communication documented in the PACS Dashboard.   Original Report Authenticated By: Signa Kell, M.D.    Dg Abd 2 Views  12/19/2012  *RADIOLOGY REPORT*  Clinical Data: Small bowel obstruction.  Nausea vomiting.  ABDOMEN - 2 VIEW  Comparison: 12/11/2012  Findings: Scattered air fluid levels are seen but no significantly dilated bowel loops are visualized on today's exam.  A small amount of residual  oral contrast is seen in nondilated bowel loops, and residual contrast from recent CT is seen in the urinary tract.  No evidence of free intraperitoneal air.  IMPRESSION: Scattered air fluid levels, without gaseous dilatation of bowel or free air.   Original Report Authenticated By: Myles Rosenthal, M.D.  Dg Abd 2 Views  12/11/2012  *RADIOLOGY REPORT*  Clinical Data: Nausea and vomiting.  Carcinoid tumor of intestine.  ABDOMEN - 2 VIEW  Comparison: 12/01/2012  Findings: Normal bowel gas pattern.  Negative for bowel obstruction or pneumoperitoneum.  No acute bony change.  No renal calcification.  Surgical bowel clips are present in the left upper quadrant.  IMPRESSION: Nonobstructive bowel gas pattern.   Original Report Authenticated By: Janeece Riggers, M.D.    Dg Abd 2 Views  12/01/2012  *RADIOLOGY REPORT*  Clinical Data: Nausea and vomiting.  Assess for obstruction.  ABDOMEN - 2 VIEW  Comparison: Abdomen and pelvis CT, 06/26/2012  Findings: No bowel gas is evident.   There is no evidence of obstruction.  The soft tissues are unremarkable.  No significant bony abnormality.   There is no free air.  IMPRESSION: No bowel gas is seen.  This raises suspicion for a gastroenteritis leading to fluid-filled bowel.  There is no evidence of obstruction and no free air.   Original Report Authenticated By: Amie Portland, M.D.     Assessment and Plan:   1. Metastatic low grade carcinoid tumor. She has been on monthly Sandostatin and S/P 6 cycles of xeloda/temozolamide beginnning 09/06/2010.  Her CT scan on 12/30 shows mild progression of disease in the liver.  Treatment options are limited  Per Dr. Truett Perna notes. I am not sure she is a candidate for Sutent or Afinitor. These can be considered if her clinical status improves.  I will leave that up to Dr. Kalman Drape discretion.   2. Bowel Obstruction: I agree with NG tube and supportive management like you are doing hoping that will resolve naturally. I doubt further  surgery to relive the obstruction is possible. I will leave that up to  her surgeons.  For now supportive managements with IVF and antiemetics would be the best course.

## 2012-12-20 NOTE — Progress Notes (Signed)
Report given to [facility].

## 2012-12-21 ENCOUNTER — Inpatient Hospital Stay (HOSPITAL_COMMUNITY): Payer: Medicare Other

## 2012-12-21 LAB — DIFFERENTIAL
Basophils Absolute: 0 10*3/uL (ref 0.0–0.1)
Basophils Relative: 0 % (ref 0–1)
Eosinophils Relative: 3 % (ref 0–5)
Lymphocytes Relative: 15 % (ref 12–46)
Neutro Abs: 5.2 10*3/uL (ref 1.7–7.7)

## 2012-12-21 LAB — TRIGLYCERIDES: Triglycerides: 108 mg/dL (ref <150)

## 2012-12-21 LAB — COMPREHENSIVE METABOLIC PANEL
ALT: 29 U/L (ref 0–35)
AST: 35 U/L (ref 0–37)
Albumin: 2.4 g/dL — ABNORMAL LOW (ref 3.5–5.2)
CO2: 27 mEq/L (ref 19–32)
Calcium: 7 mg/dL — ABNORMAL LOW (ref 8.4–10.5)
GFR calc non Af Amer: >90 mL/min (ref >90)
Sodium: 136 mEq/L (ref 135–145)
Total Protein: 6 g/dL (ref 6.0–8.3)

## 2012-12-21 LAB — GLUCOSE, CAPILLARY
Glucose-Capillary: 172 mg/dL — ABNORMAL HIGH (ref 70–99)
Glucose-Capillary: 186 mg/dL — ABNORMAL HIGH (ref 70–99)
Glucose-Capillary: 212 mg/dL — ABNORMAL HIGH (ref 70–99)
Glucose-Capillary: 179 mg/dL — ABNORMAL HIGH (ref 70–99)

## 2012-12-21 LAB — CBC
MCHC: 32.4 g/dL (ref 30.0–36.0)
MCV: 90.6 fL (ref 78.0–100.0)
Platelets: 236 10*3/uL (ref 150–400)
RDW: 13.7 % (ref 11.5–15.5)
WBC: 6.8 10*3/uL (ref 4.0–10.5)

## 2012-12-21 LAB — PHOSPHORUS: Phosphorus: 2.1 mg/dL — ABNORMAL LOW (ref 2.3–4.6)

## 2012-12-21 LAB — PREALBUMIN: Prealbumin: 9.7 mg/dL — ABNORMAL LOW (ref 17.0–34.0)

## 2012-12-21 MED ORDER — POTASSIUM CHLORIDE IN NACL 20-0.9 MEQ/L-% IV SOLN
INTRAVENOUS | Status: AC
  Administered 2012-12-22: 50 mL via INTRAVENOUS
  Filled 2012-12-21 (×2): qty 1000

## 2012-12-21 MED ORDER — POTASSIUM PHOSPHATE DIBASIC 3 MMOLE/ML IV SOLN
20.0000 mmol | Freq: Once | INTRAVENOUS | Status: AC
  Administered 2012-12-21: 20 mmol via INTRAVENOUS
  Filled 2012-12-21: qty 6.67

## 2012-12-21 MED ORDER — ENOXAPARIN SODIUM 40 MG/0.4ML ~~LOC~~ SOLN
40.0000 mg | SUBCUTANEOUS | Status: DC
  Administered 2012-12-21: 40 mg via SUBCUTANEOUS
  Filled 2012-12-21 (×2): qty 0.4

## 2012-12-21 MED ORDER — INSULIN ASPART 100 UNIT/ML ~~LOC~~ SOLN
0.0000 [IU] | SUBCUTANEOUS | Status: DC
  Administered 2012-12-21: 3 [IU] via SUBCUTANEOUS
  Administered 2012-12-21 – 2012-12-22 (×6): 2 [IU] via SUBCUTANEOUS
  Administered 2012-12-23: 1 [IU] via SUBCUTANEOUS
  Administered 2012-12-23: 2 [IU] via SUBCUTANEOUS
  Administered 2012-12-23: 1 [IU] via SUBCUTANEOUS

## 2012-12-21 MED ORDER — POTASSIUM CHLORIDE 10 MEQ/100ML IV SOLN
10.0000 meq | INTRAVENOUS | Status: AC
  Administered 2012-12-21 (×3): 10 meq via INTRAVENOUS
  Filled 2012-12-21 (×3): qty 100

## 2012-12-21 MED ORDER — CLINIMIX E/DEXTROSE (5/20) 5 % IV SOLN
INTRAVENOUS | Status: AC
  Administered 2012-12-21: 17:00:00 via INTRAVENOUS
  Filled 2012-12-21: qty 2000

## 2012-12-21 NOTE — Progress Notes (Signed)
Agree with above  D/w pt and family the pt's status and the need to try and manage this non-op for as long as possible due to her previous surgeries and carcinoid, but if needed will attempt ex lap should non-op mgmt be unsuccessful.  Con't NGT

## 2012-12-21 NOTE — Progress Notes (Signed)
Patient ID: Lori Dennis, female   DOB: 1946-11-17, 67 y.o.   MRN: [id number]    Subjective: Pt feels much better today as in she has more energy.  No flatus or output from her ostomy.  She has a h/o high ileostomy output.  Objective: Vital signs in last 24 hours: Temp:  [97.3 F (36.3 C)-97.9 F (36.6 C)] 97.9 F (36.6 C) (01/02 0610) Pulse Rate:  [70-77] 71  (01/02 0610) Resp:  [18] 18  (01/02 0610) BP: (121-149)/(74-78) 149/78 mmHg (01/02 0610) SpO2:  [94 %-99 %] 96 % (01/02 0610) Last BM Date: 12/20/12  Intake/Output from previous day: 01/01 0701 - 01/02 0700 In: 541 [I.V.:541] Out: 4150 [Urine:2950; Emesis/NG output:1200] Intake/Output this shift: Total I/O In: -  Out: 900 [Urine:200; Emesis/NG output:700]  PE: Abd: soft, NT, ND, ostomy in place with no flatus or output. Heart: regular Lungs: CTAB  Lab Results:   Basename 12/21/12 0413 12/20/12 0440  WBC 6.8 9.1  HGB 11.9* 11.9*  HCT 36.7 36.4  PLT 236 219   BMET  Basename 12/21/12 0413 12/20/12 1628  NA 136 140  K 3.2* 2.9*  CL 100 102  CO2 27 23  GLUCOSE 177* 69*  BUN 8 10  CREATININE 0.66 0.83  CALCIUM 7.0* 6.5*   PT/INR No results found for this basename: LABPROT:2,INR:2 in the last 72 hours CMP     Component Value Date/Time   NA 136 12/21/2012 0413   NA 141 12/11/2012 1245   NA 144 06/26/2012 1401   K 3.2* 12/21/2012 0413   K 3.4* 12/11/2012 1245   K 5.1* 06/26/2012 1401   CL 100 12/21/2012 0413   CL 96* 12/11/2012 1245   CL 97* 06/26/2012 1401   CO2 27 12/21/2012 0413   CO2 30* 12/11/2012 1245   CO2 29 06/26/2012 1401   GLUCOSE 177* 12/21/2012 0413   GLUCOSE 100* 12/11/2012 1245   GLUCOSE 131* 06/26/2012 1401   BUN 8 12/21/2012 0413   BUN 13.0 12/11/2012 1245   BUN 20 06/26/2012 1401   CREATININE 0.66 12/21/2012 0413   CREATININE 1.0 12/11/2012 1245   CREATININE 1.0 06/26/2012 1401   CALCIUM 7.0* 12/21/2012 0413   CALCIUM 7.7* 12/11/2012 1245   CALCIUM 8.2 06/26/2012 1401   PROT 6.0 12/21/2012 0413   PROT  7.0 12/11/2012 1245   ALBUMIN 2.4* 12/21/2012 0413   ALBUMIN 3.6 12/11/2012 1245   AST 35 12/21/2012 0413   AST 62* 12/11/2012 1245   ALT 29 12/21/2012 0413   ALT 63* 12/11/2012 1245   ALKPHOS 164* 12/21/2012 0413   ALKPHOS 280* 12/11/2012 1245   BILITOT 0.4 12/21/2012 0413   BILITOT 0.98 12/11/2012 1245   GFRNONAA >90 12/21/2012 0413   GFRAA >90 12/21/2012 0413   Lipase     Component Value Date/Time   LIPASE 28 12/19/2012 1430       Studies/Results: Abd 1 View (kub)  12/20/2012  *RADIOLOGY REPORT*  Clinical Data: Bowel obstruction.  ABDOMEN - 1 VIEW  Comparison: 12/19/2012  Findings: Small bowel and colon are now completely decompressed and show no residual signs of obstruction or ileus.  Minimal amount of residual oral contrast material remains in the region of the splenic flexure of the colon.  IMPRESSION: Complete resolution of bowel obstruction.   Original Report Authenticated By: Irish Lack, M.D.    Dg Abd 2 Views  12/21/2012  *RADIOLOGY REPORT*  Clinical Data: 67 year old female with bowel obstruction. History of metastatic carcinoid tumor.  ABDOMEN - 2 VIEW  Comparison: Abdominal radiographs 12/20/2012.  CT abdomen pelvis 12/18/2012.  Findings: Partially visualized left upper extremity approach PICC catheter.  Tip at the level of the cavoatrial junction.  NG tube now in place, tip at the level of the gastric body.  Small volume of contrast at the level of the left abdominal ostomy.  Paucity of bowel gas otherwise with no dilated loops evident.  Mild patchy opacity at the lung bases, now greater on the left. Stable visualized osseous structures.   No pneumoperitoneum.  IMPRESSION: 1.  NG tube in place, tip at the level of the gastric body. 2.  Paucity of bowel gas with no dilated loops evident to suggest obstruction.  Small volume retained contrast in the left abdomen probably at the level of the ostomy.  No free air.   Original Report Authenticated By: Erskine Speed, M.D.    Dg Abd 2  Views  12/19/2012  *RADIOLOGY REPORT*  Clinical Data: Small bowel obstruction.  Nausea vomiting.  ABDOMEN - 2 VIEW  Comparison: 12/11/2012  Findings: Scattered air fluid levels are seen but no significantly dilated bowel loops are visualized on today's exam.  A small amount of residual oral contrast is seen in nondilated bowel loops, and residual contrast from recent CT is seen in the urinary tract.  No evidence of free intraperitoneal air.  IMPRESSION: Scattered air fluid levels, without gaseous dilatation of bowel or free air.   Original Report Authenticated By: Myles Rosenthal, M.D.     Anti-infectives: Anti-infectives    None       Assessment/Plan  1. SBO, secondary to adhesions vs tumor 2. Carcinoid tumor of intestines  Plan: 1. Cont NGT and bowel rest.  The x-rays are not very impressive as the bowels are fluid filled and unable to get a true representation of her obstruction.  We will follow her clinically at this point with no further x-rays for now.  We will follow her ostomy output as this will give [place] the most information.  I had a long d/w the family and patient today.  We will likely give the patient at least 5 days or so prior to proceeding with any type of an operation.  They understand and agree with this plan.   LOS: 2 days    Roslind Michaux E 12/21/2012, 11:03 AM Pager: [phone]

## 2012-12-21 NOTE — Progress Notes (Signed)
Subjective: Feels much better  Objective: Vital signs in last 24 hours: Temp:  [97.3 F (36.3 C)-97.7 F (36.5 C)] 97.3 F (36.3 C) (01/01 2207) Pulse Rate:  [70-77] 70  (01/01 2207) Resp:  [18] 18  (01/01 2207) BP: (121-124)/(74-75) 124/75 mmHg (01/01 2207) SpO2:  [94 %-99 %] 94 % (01/01 2207) Weight change:  Last BM Date: 12/20/12  Intake/Output from previous day: 01/01 0701 - 01/02 0700 In: 541 [I.V.:541] Out: 3750 [Urine:2550; Emesis/NG output:1200] Intake/Output this shift: Total I/O In: 541 [I.V.:541] Out: 1400 [Urine:1400]  General appearance: alert and cooperative Resp: clear to auscultation bilaterally Cardio: regular rate and rhythm, S1, S2 normal, no murmur, click, rub or gallop GI: soft, non-tender; bowel sounds normal; no masses,  no organomegaly Extremities: extremities normal, atraumatic, no cyanosis or edema  Lab Results:  [facility] 12/21/12 0413 12/20/12 0440  WBC 6.8 9.1  HGB 11.9* 11.9*  HCT 36.7 36.4  PLT 236 219   BMET  Basename 12/21/12 0413 12/20/12 1628  NA 136 140  K 3.2* 2.9*  CL 100 102  CO2 27 23  GLUCOSE 177* 69*  BUN 8 10  CREATININE 0.66 0.83  CALCIUM 7.0* 6.5*    Studies/Results: Abd 1 View (kub)  12/20/2012  *RADIOLOGY REPORT*  Clinical Data: Bowel obstruction.  ABDOMEN - 1 VIEW  Comparison: 12/19/2012  Findings: Small bowel and colon are now completely decompressed and show no residual signs of obstruction or ileus.  Minimal amount of residual oral contrast material remains in the region of the splenic flexure of the colon.  IMPRESSION: Complete resolution of bowel obstruction.   Original Report Authenticated By: Irish Lack, M.D.    Dg Abd 2 Views  12/19/2012  *RADIOLOGY REPORT*  Clinical Data: Small bowel obstruction.  Nausea vomiting.  ABDOMEN - 2 VIEW  Comparison: 12/11/2012  Findings: Scattered air fluid levels are seen but no significantly dilated bowel loops are visualized on today's exam.  A small amount of residual  oral contrast is seen in nondilated bowel loops, and residual contrast from recent CT is seen in the urinary tract.  No evidence of free intraperitoneal air.  IMPRESSION: Scattered air fluid levels, without gaseous dilatation of bowel or free air.   Original Report Authenticated By: Myles Rosenthal, M.D.     Medications: I have reviewed the patient's current medications.  Assessment/Plan: SBO improved clinically and radiographically. Management per general surgery  metatstatic Carcinoid with incease tumor burden- Oncology consult- symptoms from tumor load  FEN Low K/ Mg- replacing in TPN,  ARF resolved with IVF     LOS: 2 days   Lori Dennis JOSEPH 12/21/2012, 6:07 AM

## 2012-12-21 NOTE — Progress Notes (Signed)
INITIAL NUTRITION ASSESSMENT  Pt meets criteria for severe MALNUTRITION in the context of acute illness as evidenced by <25-50% estimated energy intake with 6.3-7.5% weight loss in the past 1.5 months.  DOCUMENTATION CODES Per approved criteria  -Severe malnutrition in the context of acute illness or injury   INTERVENTION: - TPN per pharmacy - pt at high refeeding risk per pt report of minimal intake for 1.5 months - recommend close monitoring of pt's potassium, magnesium, and phosphorus - Will continue to monitor  NUTRITION DIAGNOSIS: Inadequate oral intake related to bowel obstruction as evidenced by NPO.   Goal: TPN to meet >/= 90% estimated nutritional needs.   Monitor:  Weights, labs, TPN, diet advancement, ileostomy/NGT output  Reason for Assessment: Consult for new TPN  67 y.o. female  Admitting Dx: Nausea, vomiting, dehydration, possible small bowel obstruction  ASSESSMENT: Pt with carcinoid tumor of intestine s/p ileostomy. Pt reports barely eating anything other than a few bites of eggs or grits for the past month and a half because of nausea/vomiting. Pt stated her doctor told her to try clear liquids but even that would cause nausea/vomiting. Pt reports 10-12 pound unintended weight loss in the past 1.5 months. Pt reports emptying her ileostomy 6-8 times/day at home. Pt states her nausea/vomiting resolved once NGT placed, pt with dark green output since this morning. Pt found to have bowel obstruction and started TPN yesterday evening. Pt with low potassium and magnesium yesterday and with low phosphorus today likely related to pt showing signs of refeeding syndrome with minimal intake for 1.5 months per pt report.   Height: Ht Readings from Last 1 Encounters:  12/19/12 5\' 3"  (1.6 m)    Weight: Wt Readings from Last 1 Encounters:  12/19/12 148 lb 13 oz (67.5 kg)    Ideal Body Weight: 115 lb  % Ideal Body Weight: 129  Wt Readings from Last 10 Encounters:    12/19/12 148 lb 13 oz (67.5 kg)  12/11/12 152 lb 4.8 oz (69.083 kg)  11/03/12 160 lb 4.8 oz (72.712 kg)  08/04/12 164 lb (74.39 kg)  07/05/12 165 lb 14.4 oz (75.252 kg)  06/07/12 164 lb (74.39 kg)  02/16/12 160 lb 3.2 oz (72.666 kg)  10/26/11 159 lb 14.4 oz (72.53 kg)  08/02/11 156 lb 1.6 oz (70.806 kg)    Usual Body Weight: 158-160 lb  % Usual Body Weight: 92-94  BMI:  Body mass index is 26.36 kg/(m^2).  Estimated Nutritional Needs: Kcal: 1400-1675 Protein: 80-100g Fluid: 1.4-1.6L/day   Diet Order: NPO  EDUCATION NEEDS: -No education needs identified at this time   Intake/Output Summary (Last 24 hours) at 12/21/12 1433 Last data filed at 12/21/12 1158  Gross per 24 hour  Intake    571 ml  Output   4500 ml  Net  -3929 ml    Last BM: 1/1  Labs:   Lab 12/21/12 0413 12/20/12 1628 12/20/12 0440 12/19/12 1910  NA 136 140 136 --  K 3.2* 2.9* 2.9* --  CL 100 102 100 --  CO2 27 23 24  --  BUN 8 10 14  --  CREATININE 0.66 0.83 1.25* --  CALCIUM 7.0* 6.5* 6.0* --  MG 1.9 1.4* -- 0.7*  PHOS 2.1* -- -- 4.6  GLUCOSE 177* 69* 97 --    CBG (last 3)   Basename 12/21/12 0550 12/20/12 2353 12/20/12 1826  GLUCAP 186* 172* 87    Scheduled Meds:   . enoxaparin (LOVENOX) injection  40 mg Subcutaneous Q24H  .  insulin aspart  0-9 Units Subcutaneous Q4H  . pantoprazole (PROTONIX) IV  40 mg Intravenous Q24H  . potassium phosphate IVPB (mmol)  20 mmol Intravenous Once    Continuous Infusions:   . 0.9 % NaCl with KCl 20 mEq / L 60 mL/hr at 12/21/12 0329  . 0.9 % NaCl with KCl 20 mEq / L    . TPN (CLINIMIX) +/- additives 40 mL/hr at 12/20/12 1748   And  . fat emulsion 250 mL (12/20/12 1748)  . TPN [facility]) +/- additives      Past Medical History  Diagnosis Date  . Cancer 02/19/2008    Metastatic carcinoid tumor  . Hypertension   . Complication of anesthesia     slow to wake up    Past Surgical History  Procedure Date  . Right colectomy 04/02/2008    with  descending colostomy & gastrojejunostomy  . Vaginal hysterectomy     at age 14  . Cholecystectomy     age [address] [place], [place], [place] [phone] Pager [phone] After Hours Pager

## 2012-12-21 NOTE — Progress Notes (Signed)
IP PROGRESS NOTE  Subjective:   Patient is feeling better. No more vomiting.   Objective:  Vital signs in last 24 hours: Temp:  [97.3 F (36.3 C)-97.9 F (36.6 C)] 97.9 F (36.6 C) (01/02 0610) Pulse Rate:  [70-77] 71  (01/02 0610) Resp:  [18] 18  (01/02 0610) BP: (121-149)/(74-78) 149/78 mmHg (01/02 0610) SpO2:  [94 %-99 %] 96 % (01/02 0610) Weight change:  Last BM Date: 12/20/12  Intake/Output from previous day: 01/01 0701 - 01/02 0700 In: 541 [I.V.:541] Out: 4150 [Urine:2950; Emesis/NG output:1200]  Mouth: mucous membranes moist, pharynx normal without lesions Resp: clear to auscultation bilaterally Cardio: regular rate and rhythm, S1, S2 normal, no murmur, click, rub or gallop GI: abnormal findings:  hyperactive bowel sounds Extremities: extremities normal, atraumatic, no cyanosis or edema  Portacath/PICC-without erythema  Lab Results:  Basename 12/21/12 0413 12/20/12 0440  WBC 6.8 9.1  HGB 11.9* 11.9*  HCT 36.7 36.4  PLT 236 219    BMET  Basename 12/21/12 0413 12/20/12 1628  NA 136 140  K 3.2* 2.9*  CL 100 102  CO2 27 23  GLUCOSE 177* 69*  BUN 8 10  CREATININE 0.66 0.83  CALCIUM 7.0* 6.5*    Studies/Results: Abd 1 View (kub)  12/20/2012  *RADIOLOGY REPORT*  Clinical Data: Bowel obstruction.  ABDOMEN - 1 VIEW  Comparison: 12/19/2012  Findings: Small bowel and colon are now completely decompressed and show no residual signs of obstruction or ileus.  Minimal amount of residual oral contrast material remains in the region of the splenic flexure of the colon.  IMPRESSION: Complete resolution of bowel obstruction.   Original Report Authenticated By: Irish Lack, M.D.    Dg Abd 2 Views  12/21/2012  *RADIOLOGY REPORT*  Clinical Data: 67 year old female with bowel obstruction. History of metastatic carcinoid tumor.  ABDOMEN - 2 VIEW  Comparison: Abdominal radiographs 12/20/2012.  CT abdomen pelvis 12/18/2012.  Findings: Partially visualized left upper  extremity approach PICC catheter.  Tip at the level of the cavoatrial junction.  NG tube now in place, tip at the level of the gastric body.  Small volume of contrast at the level of the left abdominal ostomy.  Paucity of bowel gas otherwise with no dilated loops evident.  Mild patchy opacity at the lung bases, now greater on the left. Stable visualized osseous structures.   No pneumoperitoneum.  IMPRESSION: 1.  NG tube in place, tip at the level of the gastric body. 2.  Paucity of bowel gas with no dilated loops evident to suggest obstruction.  Small volume retained contrast in the left abdomen probably at the level of the ostomy.  No free air.   Original Report Authenticated By: Erskine Speed, M.D.    Dg Abd 2 Views  12/19/2012  *RADIOLOGY REPORT*  Clinical Data: Small bowel obstruction.  Nausea vomiting.  ABDOMEN - 2 VIEW  Comparison: 12/11/2012  Findings: Scattered air fluid levels are seen but no significantly dilated bowel loops are visualized on today's exam.  A small amount of residual oral contrast is seen in nondilated bowel loops, and residual contrast from recent CT is seen in the urinary tract.  No evidence of free intraperitoneal air.  IMPRESSION: Scattered air fluid levels, without gaseous dilatation of bowel or free air.   Original Report Authenticated By: Myles Rosenthal, M.D.     Medications: I have reviewed the patient's current medications.  Assessment/Plan:  1. Metastatic low grade carcinoid tumor. She has been on monthly Sandostatin and S/P 6  cycles of xeloda/temozolamide beginnning 09/06/2010.  Her CT scan on 12/30 shows mild progression of disease in the liver.  Treatment options  can be considered if her clinical status improves up to Dr. Kalman Drape discretion.   2. Bowel Obstruction: I agree with NG tube and supportive management like you are doing hoping that will resolve naturally. Clinically improved.     LOS: 2 days   SHADAD,FIRAS 12/21/2012, 1:11 PM

## 2012-12-21 NOTE — Progress Notes (Signed)
PARENTERAL NUTRITION CONSULT NOTE - FOLLOW UP  Pharmacy Consult for TNA Indication: SBO  No Known Allergies  Patient Measurements: Height: 5\' 3"  (160 cm) Weight: 148 lb 13 oz (67.5 kg) IBW/kg (Calculated) : 52.4  Adjusted Body Weight: 57kg Usual Weight: unknown  Vital Signs: Temp: 97.9 F (36.6 C) (01/02 0610) Temp src: Oral (01/02 0610) BP: 149/78 mmHg (01/02 0610) Pulse Rate: 71  (01/02 0610) Intake/Output from previous day: 01/01 0701 - 01/02 0700 In: 541 [I.V.:541] Out: 4150 [Urine:2950; Emesis/NG output:1200] Intake/Output from this shift:    Labs:  Basename 12/21/12 0413 12/20/12 0440 12/19/12 1430  WBC 6.8 9.1 12.7*  HGB 11.9* 11.9* 14.3  HCT 36.7 36.4 41.6  PLT 236 219 310  APTT -- -- --  INR -- -- --     Basename 12/21/12 0413 12/20/12 1628 12/20/12 0440 12/19/12 1910 12/19/12 1430  NA 136 140 136 -- --  K 3.2* 2.9* 2.9* -- --  CL 100 102 100 -- --  CO2 27 23 24  -- --  GLUCOSE 177* 69* 97 -- --  BUN 8 10 14  -- --  CREATININE 0.66 0.83 1.25* -- --  LABCREA -- -- -- -- --  CREAT24HRUR -- -- -- -- --  CALCIUM 7.0* 6.5* 6.0* -- --  MG 1.9 1.4* -- 0.7* --  PHOS 2.1* -- -- 4.6 --  PROT 6.0 -- -- 7.9 7.7  ALBUMIN 2.4* -- -- 3.5 3.4*  AST 35 -- -- 47* 45*  ALT 29 -- -- 38* 38*  ALKPHOS 164* -- -- 227* 222*  BILITOT 0.4 -- -- 0.8 0.9  BILIDIR -- -- -- 0.3 --  IBILI -- -- -- 0.5 --  PREALBUMIN -- -- -- -- --  TRIG 108 -- -- -- --  CHOLHDL -- -- -- -- --  CHOL 87 -- -- -- --   Estimated Creatinine Clearance: 63.8 ml/min (by C-G formula based on Cr of 0.66).    Basename 12/21/12 0550 12/20/12 2353 12/20/12 1826  GLUCAP 186* 172* 87    Medications:  Scheduled:    . [COMPLETED] sodium chloride   Intravenous STAT  . [COMPLETED] calcium gluconate  1 g Intravenous Once  . enoxaparin (LOVENOX) injection  40 mg Subcutaneous Q24H  . insulin aspart  0-9 Units Subcutaneous Q6H  . [COMPLETED] magnesium sulfate 1 - 4 g bolus IVPB  2 g Intravenous Once    . pantoprazole (PROTONIX) IV  40 mg Intravenous Q24H  . [COMPLETED] potassium chloride  10 mEq Intravenous Q1 Hr x 3  . [COMPLETED] potassium chloride  10 mEq Intravenous Q1 Hr x 4  . [DISCONTINUED] enoxaparin (LOVENOX) injection  30 mg Subcutaneous Q24H   Infusions:    . 0.9 % NaCl with KCl 20 mEq / L 60 mL/hr at 12/21/12 0329  . TPN (CLINIMIX) +/- additives 40 mL/hr at 12/20/12 1748   And  . fat emulsion 250 mL (12/20/12 1748)  . [DISCONTINUED] sodium chloride 100 mL/hr at 12/20/12 1150    Insulin Requirements in the past 24 hours:  CBGs: 172, 186 4 units SSI sensitive scale  Current Nutrition:  NPO Clinimix E5/20 at 29ml/hr Lipids 20% at 86ml/hr  IVF: NS with 20K at 44ml/hr  Nutritional Goals:  Estimate: 1500-1800 kCal, 90 grams of protein per day  Await RD recommendations (dietician consult ordered) Clinimix E5/20 at 75 ml/hr + Lipids 10 ml/hr MWF to provide: 90 g/day protein and 2064 Kcal/day MWF, 1584 Kcal/day STTHS(Avg. 1790 Kcal/day weekly).   Assessment: 66 yoF  with hx metastatic Carcinioid tumor. CT for staging on 12/30 shows progression of disease with stomach and small bowel distention.   Electrolytes: K improved but remains low after 7 runs KCL 1/1. Phos improved but remains low. Mg improved to WNL after MgSO4 2gm 1/1. Corrected Ca WNL.  SCr: ARF improved with hydration  LFTs: AlkPhos elevated but improved. Tbili, AST/ALT WNL  TGs: WNL 108 (1/2)  Prealbumin: pending   Plan: at 1800 tonight  Increase Clinimix E5/20 slowly to 67ml/hr given continued electrolyte abnormalities  Fat emulsion at 10 ml/hr(MWF only due to ongoing shortage).  Standard multivitamins and trace elements (MWF only due to ongoing shortage).  Decrease IVF to 34ml/hr  KCL IV x 3 runs  Potassium phosphate IV x 1  Change CBG/SSI to q4h - continue sensitive scale  TNA lab panels on Mon/Thurs  F/U daily for plan of care, diet advancement  F/U RD  recommendations   Loralee Pacas, PharmD, BCPS Pager: [phone] 12/21/2012,7:12 AM

## 2012-12-21 NOTE — Care Management Note (Signed)
    Page 1 of 1   12/21/2012     2:56:05 PM   CARE MANAGEMENT NOTE 12/21/2012  Patient:  Lori Dennis, Lori Dennis   Account Number:  [id number]  Date Initiated:  12/21/2012  Documentation initiated by:  Lorenda Ishihara  Subjective/Objective Assessment:   67 yo female admitted with bowel obstruction related to tumor. PTA lived at home alone     Action/Plan:   Possibly Hospice   Anticipated DC Date:  12/25/2012   Anticipated DC Plan:  HOME W HOSPICE CARE      DC Planning Services  CM consult      Choice offered to / List presented to:             Status of service:  Completed, signed off Medicare Important Message given?   (If response is "NO", the following Medicare IM given date fields will be blank) Date Medicare IM given:   Date Additional Medicare IM given:    Discharge Disposition:  HOME W HOSPICE CARE  Per UR Regulation:  Reviewed for med. necessity/level of care/duration of stay  If discussed at Long Length of Stay Meetings, dates discussed:    Comments:

## 2012-12-22 LAB — COMPREHENSIVE METABOLIC PANEL
AST: 20 U/L (ref 0–37)
Albumin: 2.4 g/dL — ABNORMAL LOW (ref 3.5–5.2)
Calcium: 7.9 mg/dL — ABNORMAL LOW (ref 8.4–10.5)
Chloride: 99 mEq/L (ref 96–112)
Creatinine, Ser: 0.64 mg/dL (ref 0.50–1.10)
Total Protein: 5.8 g/dL — ABNORMAL LOW (ref 6.0–8.3)

## 2012-12-22 LAB — GLUCOSE, CAPILLARY
Glucose-Capillary: 140 mg/dL — ABNORMAL HIGH (ref 70–99)
Glucose-Capillary: 152 mg/dL — ABNORMAL HIGH (ref 70–99)
Glucose-Capillary: 163 mg/dL — ABNORMAL HIGH (ref 70–99)

## 2012-12-22 LAB — PHOSPHORUS: Phosphorus: 2.4 mg/dL (ref 2.3–4.6)

## 2012-12-22 MED ORDER — MAGNESIUM SULFATE 40 MG/ML IJ SOLN
2.0000 g | Freq: Once | INTRAMUSCULAR | Status: AC
  Administered 2012-12-22: 2 g via INTRAVENOUS
  Filled 2012-12-22: qty 50

## 2012-12-22 MED ORDER — LORAZEPAM 2 MG/ML IJ SOLN
0.5000 mg | INTRAMUSCULAR | Status: DC | PRN
  Administered 2012-12-22 (×2): 0.5 mg via INTRAVENOUS
  Filled 2012-12-22 (×2): qty 1

## 2012-12-22 MED ORDER — HYDROCODONE-ACETAMINOPHEN 5-325 MG PO TABS: 1.0000 | ORAL_TABLET | ORAL | Status: DC | PRN

## 2012-12-22 MED ORDER — FAT EMULSION 20 % IV EMUL
250.0000 mL | INTRAVENOUS | Status: AC
  Administered 2012-12-22: 250 mL via INTRAVENOUS
  Filled 2012-12-22: qty 250

## 2012-12-22 MED ORDER — TRACE MINERALS CR-CU-F-FE-I-MN-MO-SE-ZN IV SOLN
INTRAVENOUS | Status: AC
  Administered 2012-12-22: 18:00:00 via INTRAVENOUS
  Filled 2012-12-22: qty 2000

## 2012-12-22 MED ORDER — ENOXAPARIN SODIUM 40 MG/0.4ML ~~LOC~~ SOLN
40.0000 mg | SUBCUTANEOUS | Status: DC
  Administered 2012-12-22 – 2012-12-25 (×4): 40 mg via SUBCUTANEOUS
  Filled 2012-12-22 (×5): qty 0.4

## 2012-12-22 MED ORDER — POTASSIUM CHLORIDE 10 MEQ/100ML IV SOLN
10.0000 meq | INTRAVENOUS | Status: AC
  Administered 2012-12-22 (×4): 10 meq via INTRAVENOUS
  Filled 2012-12-22 (×4): qty 100

## 2012-12-22 MED ORDER — LORATADINE 10 MG PO TABS
10.0000 mg | ORAL_TABLET | Freq: Every day | ORAL | Status: DC | PRN
  Filled 2012-12-22: qty 1

## 2012-12-22 MED ORDER — POTASSIUM CHLORIDE IN NACL 40-0.9 MEQ/L-% IV SOLN
INTRAVENOUS | Status: DC
  Administered 2012-12-23: 05:00:00 via INTRAVENOUS
  Filled 2012-12-22 (×2): qty 1000

## 2012-12-22 NOTE — Progress Notes (Signed)
Subjective: Clear nasal discharge, no nausea or pain  Objective: Vital signs in last 24 hours: Temp:  [98 F (36.7 C)-99 F (37.2 C)] 98 F (36.7 C) (01/03 0603) Pulse Rate:  [66-78] 66  (01/03 0603) Resp:  [16] 16  (01/03 0603) BP: (138-148)/(74-85) 144/74 mmHg (01/03 0603) SpO2:  [97 %-99 %] 99 % (01/03 0603) Weight change:  Last BM Date: 12/21/12  Intake/Output from previous day: 01/02 0701 - 01/03 0700 In: 2293.5 [I.V.:1436.8; NG/GT:30; IV Piggyback:806.7] Out: 4150 [Urine:2350; Emesis/NG output:1800] Intake/Output this shift: Total I/O In: 435 [I.V.:435] Out: 1900 [Urine:1300; Emesis/NG output:600]  General appearance: alert and cooperative Resp: clear to auscultation bilaterally Cardio: regular rate and rhythm, S1, S2 normal, no murmur, click, rub or gallop GI: soft, NT, normal BS  Lab Results:  [facility] 12/21/12 0413 12/20/12 0440  WBC 6.8 9.1  HGB 11.9* 11.9*  HCT 36.7 36.4  PLT 236 219   BMET  Basename 12/22/12 0440 12/21/12 0413  NA 137 136  K 3.3* 3.2*  CL 99 100  CO2 30 27  GLUCOSE 181* 177*  BUN 8 8  CREATININE 0.64 0.66  CALCIUM 7.9* 7.0*    Studies/Results: Abd 1 View (kub)  12/20/2012  *RADIOLOGY REPORT*  Clinical Data: Bowel obstruction.  ABDOMEN - 1 VIEW  Comparison: 12/19/2012  Findings: Small bowel and colon are now completely decompressed and show no residual signs of obstruction or ileus.  Minimal amount of residual oral contrast material remains in the region of the splenic flexure of the colon.  IMPRESSION: Complete resolution of bowel obstruction.   Original Report Authenticated By: Irish Lack, M.D.    Dg Abd 2 Views  12/21/2012  *RADIOLOGY REPORT*  Clinical Data: 67 year old female with bowel obstruction. History of metastatic carcinoid tumor.  ABDOMEN - 2 VIEW  Comparison: Abdominal radiographs 12/20/2012.  CT abdomen pelvis 12/18/2012.  Findings: Partially visualized left upper extremity approach PICC catheter.  Tip at the level of  the cavoatrial junction.  NG tube now in place, tip at the level of the gastric body.  Small volume of contrast at the level of the left abdominal ostomy.  Paucity of bowel gas otherwise with no dilated loops evident.  Mild patchy opacity at the lung bases, now greater on the left. Stable visualized osseous structures.   No pneumoperitoneum.  IMPRESSION: 1.  NG tube in place, tip at the level of the gastric body. 2.  Paucity of bowel gas with no dilated loops evident to suggest obstruction.  Small volume retained contrast in the left abdomen probably at the level of the ostomy.  No free air.   Original Report Authenticated By: Erskine Speed, M.D.     Medications: I have reviewed the patient's current medications.  Assessment/Plan: SBO improved clinically and radiographically. Management per general surgery  metatstatic Carcinoid with incease tumor burden-  FEN Low K/ Mg- replacing in TPN, per pharmacy ARF resolved with IVF  Pain control  IV morphine for now, change to po when NG tube clamped Hypertension  BP ok off meds, probably discharge off amlodipine and on losartan Nasal discharge  Loratadine prn Disposition, surgery note noted, possible d/c as early as Sunday or Monday if tube out and tolerating diet   LOS: 3 days   Lori Dennis JOSEPH 12/22/2012, 6:34 AM

## 2012-12-22 NOTE — Progress Notes (Addendum)
PARENTERAL NUTRITION CONSULT NOTE - FOLLOW UP  Pharmacy Consult for TNA Indication: SBO  No Known Allergies  Patient Measurements: Height: 5\' 3"  (160 cm) Weight: 148 lb 13 oz (67.5 kg) IBW/kg (Calculated) : 52.4  Adjusted Body Weight: 57kg Usual Weight: unknown  Vital Signs: Temp: 98 F (36.7 C) (01/03 0603) Temp src: Oral (01/03 0603) BP: 144/74 mmHg (01/03 0603) Pulse Rate: 66  (01/03 0603) Intake/Output from previous day: 01/02 0701 - 01/03 0700 In: 2293.5 [I.V.:1436.8; NG/GT:30; IV Piggyback:806.7] Out: 4150 [Urine:2350; Emesis/NG output:1800] Intake/Output from this shift:    Labs:  [facility] 12/21/12 0413 12/20/12 0440 12/19/12 1430  WBC 6.8 9.1 12.7*  HGB 11.9* 11.9* 14.3  HCT 36.7 36.4 41.6  PLT 236 219 310  APTT -- -- --  INR -- -- --     Basename 12/22/12 0440 12/21/12 0413 12/20/12 1628 12/19/12 1910  NA 137 136 140 --  K 3.3* 3.2* 2.9* --  CL 99 100 102 --  CO2 30 27 23  --  GLUCOSE 181* 177* 69* --  BUN 8 8 10  --  CREATININE 0.64 0.66 0.83 --  LABCREA -- -- -- --  CREAT24HRUR -- -- -- --  CALCIUM 7.9* 7.0* 6.5* --  MG 1.5 1.9 1.4* --  PHOS 2.4 2.1* -- 4.6  PROT 5.8* 6.0 -- 7.9  ALBUMIN 2.4* 2.4* -- 3.5  AST 20 35 -- 47*  ALT 21 29 -- 38*  ALKPHOS 151* 164* -- 227*  BILITOT 0.4 0.4 -- 0.8  BILIDIR -- -- -- 0.3  IBILI -- -- -- 0.5  PREALBUMIN -- 9.7* -- --  TRIG -- 108 -- --  CHOLHDL -- -- -- --  CHOL -- 87 -- --   Estimated Creatinine Clearance: 63.8 ml/min (by C-G formula based on Cr of 0.64).    Basename 12/22/12 0405 12/22/12 0002 12/21/12 2001  GLUCAP 163* 152* 179*    Medications:  Scheduled:     . enoxaparin (LOVENOX) injection  40 mg Subcutaneous Q24H  . insulin aspart  0-9 Units Subcutaneous Q4H  . pantoprazole (PROTONIX) IV  40 mg Intravenous Q24H  . [COMPLETED] potassium chloride  10 mEq Intravenous Q1 Hr x 3  . [COMPLETED] potassium phosphate IVPB (mmol)  20 mmol Intravenous Once  . [DISCONTINUED] insulin aspart   0-9 Units Subcutaneous Q6H   Infusions:     . [EXPIRED] 0.9 % NaCl with KCl 20 mEq / L 60 mL/hr at 12/21/12 0329  . 0.9 % NaCl with KCl 20 mEq / L 50 mL (12/22/12 0207)  . [EXPIRED] TPN (CLINIMIX) +/- additives 40 mL/hr at 12/20/12 1748   And  . [EXPIRED] fat emulsion 250 mL (12/20/12 1748)  . TPN (CLINIMIX) +/- additives 50 mL/hr at 12/21/12 1702    Insulin Requirements in the past 24 hours:  13 units SSI sensitive scale  Current Nutrition:  NPO Clinimix E5/20 at 53ml/hr Lipids 20% at 27ml/hr MWF  IVF: NS with 20K at 4ml/hr  Nutritional Goals:  Estimated needs: 1400 -1675 kCal, 80-100 grams of protein per day  Clinimix E5/15 at 75 ml/hr + Lipids 10 ml/hr MWF to provide: 90 g/day protein and 1758 Kcal/day MWF, 1278 Kcal/day STTHS(Avg. 1484 Kcal/day weekly).   Assessment: 38 yoF with hx metastatic Carcinioid tumor. CT for staging on 12/30 shows progression of disease with stomach and small bowel distention. Per MD notes, appears obstruction improving clinically and radiographically.   Electrolytes: K improved but remains low following replacement 1/2. Phos improved and now low end  normal. Mg trending down. Corrected Ca =9.2.  SCr: ARF improved. I/O = 2293/4150 (UOP=2375ml, NGT =  LFTs: AlkPhos elevated but improved. Tbili, AST/ALT WNL  TGs: WNL 108 (1/2)  Prealbumin: 9.7 - low, reflecting depleted protein stores   Plan: at 1800 tonight  Based on RD est needs assessment and hyperglycemia, change to Clinimix E5/15 at rate of 58ml/hr (goal = 80ml/hr), continue to monitor electrolytes d/t possible refeeding.   Fat emulsion at 10 ml/hr(MWF only due to ongoing shortage).  Standard multivitamins and trace elements (MWF only due to ongoing shortage).  At 18:00 change MIVF to NS + KCl at 31ml/hr  KCL IV x 4 runs  Magnesium sulfate 2gm IVPB x 1  Follow CBGs - last 3 readings show improvement, may need to add insulin to TPN  TNA lab panels on  Mon/Thurs  F/U daily for plan of care, diet advancement  Juliette Alcide, PharmD, BCPS.   Pager: [phone] 12/22/2012,7:20 AM

## 2012-12-22 NOTE — Progress Notes (Signed)
Agree with above.  -con't NGT at this time -if bowel obstruction does not improve over the weekend she may need an attempt at ex lap and LOA.  She has been told that it is vry possible that getting into her abd may not be possible, and if it is the only option may be a bypass.

## 2012-12-22 NOTE — Progress Notes (Signed)
Patient ID: Lori Dennis, female   DOB: 1946/06/28, 67 y.o.   MRN: [id number]    Subjective: Pt doesn't feel as well today.  Feels stopped up and sneezing a lot.  C/o sore throat.  Got nauseated yesterday after having her NGT clamped for about an hour to walk and take a bath.  Still reports NO output from her ostomy  Objective: Vital signs in last 24 hours: Temp:  [98 F (36.7 C)-99 F (37.2 C)] 98 F (36.7 C) (01/03 0603) Pulse Rate:  [66-78] 66  (01/03 0603) Resp:  [16] 16  (01/03 0603) BP: (138-148)/(74-85) 144/74 mmHg (01/03 0603) SpO2:  [97 %-99 %] 99 % (01/03 0603) Last BM Date: 01/08/13  Intake/Output from previous day: 01/08/23 0701 - 01/03 0700 In: 2293.5 [I.V.:1436.8; NG/GT:30; IV Piggyback:806.7] Out: 4150 [Urine:2350; Emesis/NG output:1800] Intake/Output this shift:    PE: Abd: soft, NT, ND, ostomy with no air or liquid output.  NGT had 1800cc/24hr period of bile. Heart: regular Lungs: CTAB  Lab Results:   Basename 01/08/13 0413 12/20/12 0440  WBC 6.8 9.1  HGB 11.9* 11.9*  HCT 36.7 36.4  PLT 236 219   BMET  Basename 12/22/12 0440 01-08-13 0413  NA 137 136  K 3.3* 3.2*  CL 99 100  CO2 30 27  GLUCOSE 181* 177*  BUN 8 8  CREATININE 0.64 0.66  CALCIUM 7.9* 7.0*   PT/INR No results found for this basename: LABPROT:2,INR:2 in the last 72 hours CMP     Component Value Date/Time   NA 137 12/22/2012 0440   NA 141 12/11/2012 1245   NA 144 06/26/2012 1401   K 3.3* 12/22/2012 0440   K 3.4* 12/11/2012 1245   K 5.1* 06/26/2012 1401   CL 99 12/22/2012 0440   CL 96* 12/11/2012 1245   CL 97* 06/26/2012 1401   CO2 30 12/22/2012 0440   CO2 30* 12/11/2012 1245   CO2 29 06/26/2012 1401   GLUCOSE 181* 12/22/2012 0440   GLUCOSE 100* 12/11/2012 1245   GLUCOSE 131* 06/26/2012 1401   BUN 8 12/22/2012 0440   BUN 13.0 12/11/2012 1245   BUN 20 06/26/2012 1401   CREATININE 0.64 12/22/2012 0440   CREATININE 1.0 12/11/2012 1245   CREATININE 1.0 06/26/2012 1401   CALCIUM 7.9* 12/22/2012 0440    CALCIUM 7.7* 12/11/2012 1245   CALCIUM 8.2 06/26/2012 1401   PROT 5.8* 12/22/2012 0440   PROT 7.0 12/11/2012 1245   ALBUMIN 2.4* 12/22/2012 0440   ALBUMIN 3.6 12/11/2012 1245   AST 20 12/22/2012 0440   AST 62* 12/11/2012 1245   ALT 21 12/22/2012 0440   ALT 63* 12/11/2012 1245   ALKPHOS 151* 12/22/2012 0440   ALKPHOS 280* 12/11/2012 1245   BILITOT 0.4 12/22/2012 0440   BILITOT 0.98 12/11/2012 1245   GFRNONAA >90 12/22/2012 0440   GFRAA >90 12/22/2012 0440   Lipase     Component Value Date/Time   LIPASE 28 12/19/2012 1430       Studies/Results: Dg Abd 2 Views  Jan 08, 2013  *RADIOLOGY REPORT*  Clinical Data: 67 year old female with bowel obstruction. History of metastatic carcinoid tumor.  ABDOMEN - 2 VIEW  Comparison: Abdominal radiographs 12/20/2012.  CT abdomen pelvis 12/18/2012.  Findings: Partially visualized left upper extremity approach PICC catheter.  Tip at the level of the cavoatrial junction.  NG tube now in place, tip at the level of the gastric body.  Small volume of contrast at the level of the left abdominal ostomy.  Paucity of bowel  gas otherwise with no dilated loops evident.  Mild patchy opacity at the lung bases, now greater on the left. Stable visualized osseous structures.   No pneumoperitoneum.  IMPRESSION: 1.  NG tube in place, tip at the level of the gastric body. 2.  Paucity of bowel gas with no dilated loops evident to suggest obstruction.  Small volume retained contrast in the left abdomen probably at the level of the ostomy.  No free air.   Original Report Authenticated By: Erskine Speed, M.D.     Anti-infectives: Anti-infectives    None       Assessment/Plan  1. SBO 2. Carcinoid tumor of intestines with carcinomatosis 3. Hypokalemia  Plan: 1. The patient has not had any output from her ostomy.  She should start putting out at least some air or liquid if her obstruction were starting to resolve.  I'm hesitant to think that she has made much improvement if any in the  last 48 hours.  She did develop a large amount of output and nausea with being clamped for only just over an hour yesterday.  We would recommend leaving NGT to suction for now. 2. Still would hold off on surgery and try conservative management, but if no further improvement over the weekend I suspect she would require surgery early next week.  LOS: 3 days    Reeder Brisby E 12/22/2012, 9:22 AM Pager: [phone]

## 2012-12-23 DIAGNOSIS — K56609 Unspecified intestinal obstruction, unspecified as to partial versus complete obstruction: Secondary | ICD-10-CM | POA: Diagnosis present

## 2012-12-23 LAB — GLUCOSE, CAPILLARY
Glucose-Capillary: 126 mg/dL — ABNORMAL HIGH (ref 70–99)
Glucose-Capillary: 136 mg/dL — ABNORMAL HIGH (ref 70–99)
Glucose-Capillary: 144 mg/dL — ABNORMAL HIGH (ref 70–99)
Glucose-Capillary: 153 mg/dL — ABNORMAL HIGH (ref 70–99)
Glucose-Capillary: 173 mg/dL — ABNORMAL HIGH (ref 70–99)

## 2012-12-23 LAB — BASIC METABOLIC PANEL
BUN: 12 mg/dL (ref 6–23)
Calcium: 8.3 mg/dL — ABNORMAL LOW (ref 8.4–10.5)
GFR calc non Af Amer: >90 mL/min (ref >90)
Glucose, Bld: 160 mg/dL — ABNORMAL HIGH (ref 70–99)

## 2012-12-23 MED ORDER — INSULIN ASPART 100 UNIT/ML ~~LOC~~ SOLN
0.0000 [IU] | SUBCUTANEOUS | Status: DC
  Administered 2012-12-23 (×2): 2 [IU] via SUBCUTANEOUS
  Administered 2012-12-23: 3 [IU] via SUBCUTANEOUS
  Administered 2012-12-24: 2 [IU] via SUBCUTANEOUS
  Administered 2012-12-24 (×2): 3 [IU] via SUBCUTANEOUS
  Administered 2012-12-24 – 2012-12-25 (×2): 2 [IU] via SUBCUTANEOUS
  Administered 2012-12-25 (×2): 3 [IU] via SUBCUTANEOUS
  Administered 2012-12-25 – 2012-12-26 (×4): 2 [IU] via SUBCUTANEOUS

## 2012-12-23 MED ORDER — DIPHENHYDRAMINE HCL 50 MG/ML IJ SOLN
12.5000 mg | Freq: Four times a day (QID) | INTRAMUSCULAR | Status: DC | PRN
  Administered 2012-12-23 – 2012-12-25 (×3): 12.5 mg via INTRAVENOUS
  Filled 2012-12-23 (×3): qty 1

## 2012-12-23 MED ORDER — SALINE SPRAY 0.65 % NA SOLN
1.0000 | NASAL | Status: DC | PRN
  Filled 2012-12-23: qty 44

## 2012-12-23 MED ORDER — LORAZEPAM 2 MG/ML IJ SOLN
0.2500 mg | Freq: Every evening | INTRAMUSCULAR | Status: DC | PRN
  Administered 2012-12-23 – 2012-12-25 (×3): 0.25 mg via INTRAVENOUS
  Filled 2012-12-23 (×3): qty 1

## 2012-12-23 MED ORDER — CLINIMIX E/DEXTROSE (5/15) 5 % IV SOLN
INTRAVENOUS | Status: AC
  Administered 2012-12-23: 18:00:00 via INTRAVENOUS
  Filled 2012-12-23: qty 2000

## 2012-12-23 NOTE — Progress Notes (Signed)
PARENTERAL NUTRITION CONSULT NOTE - FOLLOW UP  Pharmacy Consult for TNA Indication: SBO  No Known Allergies  Patient Measurements: Height: 5\' 3"  (160 cm) Weight: 148 lb 13 oz (67.5 kg) IBW/kg (Calculated) : 52.4  Adjusted Body Weight: 57kg Usual Weight: unknown  Vital Signs: Temp: 97.7 F (36.5 C) (01/04 0645) Temp src: Oral (01/04 0645) BP: 161/87 mmHg (01/04 0645) Pulse Rate: 73  (01/04 0645) Intake/Output from previous day: 01/03 0701 - 01/04 0700 In: 1624 [I.V.:1164; NG/GT:60; IV Piggyback:400] Out: 3100 [Urine:1600; Emesis/NG output:1500] Intake/Output from this shift:    Labs:  [facility] 12/21/12 0413  WBC 6.8  HGB 11.9*  HCT 36.7  PLT 236  APTT --  INR --     Basename 12/23/12 0442 12/22/12 0440 12/21/12 0413  NA 137 137 136  K 3.9 3.3* 3.2*  CL 99 99 100  CO2 31 30 27   GLUCOSE 160* 181* 177*  BUN 12 8 8   CREATININE 0.65 0.64 0.66  LABCREA -- -- --  CREAT24HRUR -- -- --  CALCIUM 8.3* 7.9* 7.0*  MG 1.9 1.5 1.9  PHOS 2.6 2.4 2.1*  PROT -- 5.8* 6.0  ALBUMIN -- 2.4* 2.4*  AST -- 20 35  ALT -- 21 29  ALKPHOS -- 151* 164*  BILITOT -- 0.4 0.4  BILIDIR -- -- --  IBILI -- -- --  PREALBUMIN -- -- 9.7*  TRIG -- -- 108  CHOLHDL -- -- --  CHOL -- -- 87   Estimated Creatinine Clearance: 63.8 ml/min (by C-G formula based on Cr of 0.65).    Basename 12/23/12 0803 12/23/12 0359 12/23/12 0015  GLUCAP 173* 148* 126*    Medications:  Scheduled:     . enoxaparin (LOVENOX) injection  40 mg Subcutaneous Q24H  . insulin aspart  0-15 Units Subcutaneous Q4H  . pantoprazole (PROTONIX) IV  40 mg Intravenous Q24H  . [COMPLETED] potassium chloride  10 mEq Intravenous Q1 Hr x 4  . [DISCONTINUED] insulin aspart  0-9 Units Subcutaneous Q4H   Infusions:     . [EXPIRED] 0.9 % NaCl with KCl 20 mEq / L 40 mL/hr (12/22/12 1836)  . 0.9 % NaCl with KCl 40 mEq / L 40 mL/hr at 12/23/12 0448  . TPN (CLINIMIX) +/- additives 60 mL/hr at 12/22/12 1820   And  . fat  emulsion 250 mL (12/22/12 1822)  . [EXPIRED] TPN (CLINIMIX) +/- additives 50 mL/hr at 12/21/12 1702  . TPN (CLINIMIX) +/- additives      Insulin Requirements in the past 24 hours:  8 units SSI sensitive scale  Current Nutrition:  NPO Clinimix E5/20 at 12ml/hr Lipids 20% at 91ml/hr MWF  IVF: NS with 20K at 76ml/hr  Nutritional Goals:  Estimated needs: 1400 -1675 kCal, 80-100 grams of protein per day  Clinimix E5/15 at 75 ml/hr + Lipids 10 ml/hr MWF to provide: 90 g/day protein and 1758 Kcal/day MWF, 1278 Kcal/day STTHS(Avg. 1484 Kcal/day weekly).   Assessment: 27 yoF with hx metastatic Carcinioid tumor. CT for staging on 12/30 shows progression of disease with stomach and small bowel distention. Per MD notes, appears obstruction improving clinically and radiographically.   Chem: Lytes wnl, Scr wnl (resolved arf), elevated glucose  LFTs: AlkPhos elevated but improved. Tbili, AST/ALT WNL (1/3)  Lipids: WNL 108 (1/2)  Prealbumin: 9.7 1/2 - low, reflecting depleted protein stores prior to initiation of TNA   Plan:   Change to moderate scale SSI  Continue Clinimix E5/15 at rate of 35ml/hr (goal = 35ml/hr). Will hold  off on increase in rate until CBG/gluocose normalizes. Continue to monitor electrolytes d/t possible refeeding.   Fat emulsion at 10 ml/hr (MWF only due to ongoing shortage).  Standard multivitamins and trace elements (MWF only due to ongoing shortage).  AM BMET  TNA lab panels on Mon/Thurs  F/U daily for plan of care, diet advancement  Gwen Her PharmD  [phone] 12/23/2012 10:43 AM

## 2012-12-23 NOTE — Progress Notes (Signed)
Patient ID: Lori Dennis, female   DOB: 1946/05/12, 67 y.o.   MRN: [id number]    Subjective: Pt feeling a little better today.  She is having a bit of pasty stool and some gas in her ostomy bag.  NGT output down a bit.    Objective: Vital signs in last 24 hours: Temp:  [97.7 F (36.5 C)-98.1 F (36.7 C)] 97.7 F (36.5 C) (01/04 0645) Pulse Rate:  [70-73] 73  (01/04 0645) Resp:  [14-16] 14  (01/04 0645) BP: (161-168)/(87-91) 161/87 mmHg (01/04 0645) SpO2:  [97 %] 97 % (01/04 0645) Last BM Date: 12/21/12  Intake/Output from previous day: 01/03 0701 - 01/04 0700 In: 1624 [I.V.:1164; NG/GT:60; IV Piggyback:400] Out: 3100 [Urine:1600; Emesis/NG output:1500] Intake/Output this shift:    PE: Gen:  A&O times 3 Abd: soft, NT, ND, ostomy with moderate amt pasty brown stool.  Small amt flatus in bag.     Lab Results:   [facility] 12/21/12 0413  WBC 6.8  HGB 11.9*  HCT 36.7  PLT 236   BMET  Basename 12/23/12 0442 12/22/12 0440  NA 137 137  K 3.9 3.3*  CL 99 99  CO2 31 30  GLUCOSE 160* 181*  BUN 12 8  CREATININE 0.65 0.64  CALCIUM 8.3* 7.9*   PT/INR No results found for this basename: LABPROT:2,INR:2 in the last 72 hours CMP     Component Value Date/Time   NA 137 12/23/2012 0442   NA 141 12/11/2012 1245   NA 144 06/26/2012 1401   K 3.9 12/23/2012 0442   K 3.4* 12/11/2012 1245   K 5.1* 06/26/2012 1401   CL 99 12/23/2012 0442   CL 96* 12/11/2012 1245   CL 97* 06/26/2012 1401   CO2 31 12/23/2012 0442   CO2 30* 12/11/2012 1245   CO2 29 06/26/2012 1401   GLUCOSE 160* 12/23/2012 0442   GLUCOSE 100* 12/11/2012 1245   GLUCOSE 131* 06/26/2012 1401   BUN 12 12/23/2012 0442   BUN 13.0 12/11/2012 1245   BUN 20 06/26/2012 1401   CREATININE 0.65 12/23/2012 0442   CREATININE 1.0 12/11/2012 1245   CREATININE 1.0 06/26/2012 1401   CALCIUM 8.3* 12/23/2012 0442   CALCIUM 7.7* 12/11/2012 1245   CALCIUM 8.2 06/26/2012 1401   PROT 5.8* 12/22/2012 0440   PROT 7.0 12/11/2012 1245   ALBUMIN 2.4* 12/22/2012  0440   ALBUMIN 3.6 12/11/2012 1245   AST 20 12/22/2012 0440   AST 62* 12/11/2012 1245   ALT 21 12/22/2012 0440   ALT 63* 12/11/2012 1245   ALKPHOS 151* 12/22/2012 0440   ALKPHOS 280* 12/11/2012 1245   BILITOT 0.4 12/22/2012 0440   BILITOT 0.98 12/11/2012 1245   GFRNONAA >90 12/23/2012 0442   GFRAA >90 12/23/2012 0442   Lipase     Component Value Date/Time   LIPASE 28 12/19/2012 1430       Studies/Results: No results found.  Anti-infectives: Anti-infectives    None       Assessment/Plan  1. SBO 2. Carcinoid tumor of intestines with carcinomatosis 3. Hypokalemia  Plan: SBO - improving today!  Would not clamp or pull NGT today as output was still high.   Possible clamping trials tomorrow if stool/flatus continues and NGT output goes down. Continue TNA for severe protein calorie malnutrition.   Surgery would be difficult due to the presence of metastatic carcinoid and prior need to bypass small bowel as opposed to resection.  High risk for enteric fistula formation.      LOS:  4 days    Arlenis Blaydes 12/23/2012, 10:23 AM Pager: [phone]

## 2012-12-23 NOTE — Progress Notes (Signed)
Subjective: Patient is alert and still complaining of soreness in the back of her throat almost certainly from her NG tube.  Has nasal congestion as well.  Still on NG suction.  NG output has declined quite markedly since yesterday.  She is having some rectal discharge and also starting to produce more in her ostomy bag.  Nausea resolved with NG tube.  Objective: Weight change:   Intake/Output Summary (Last 24 hours) at 12/23/12 0846 Last data filed at 12/23/12 0700  Gross per 24 hour  Intake   1624 ml  Output   3100 ml  Net  -1476 ml   Filed Vitals:   12/22/12 0001 12/22/12 0603 12/22/12 1830 12/23/12 0645  BP:  144/74 168/91 161/87  Pulse:  66 70 73  Temp: 98.2 F (36.8 C) 98 F (36.7 C) 98.1 F (36.7 C) 97.7 F (36.5 C)  TempSrc: Oral Oral Oral Oral  Resp:  16 16 14   Height:      Weight:      SpO2:  99% 97% 97%   General appearance: alert and cooperative  Resp: clear to auscultation bilaterally  Cardio: regular rate and rhythm, S1, S2 normal, no murmur, click, rub or gallop  GI: soft, NT, normal BS, nondistended.  Ostomy with liquid brown stool Ext: Without edema or cyanosis Neuro:  Alert and nonfocal   Lab Results:  [facility] 12/23/12 0442 12/22/12 0440  NA 137 137  K 3.9 3.3*  CL 99 99  CO2 31 30  GLUCOSE 160* 181*  BUN 12 8  CREATININE 0.65 0.64  CALCIUM 8.3* 7.9*  MG 1.9 1.5  PHOS 2.6 2.4    Basename 12/22/12 0440 12/21/12 0413  AST 20 35  ALT 21 29  ALKPHOS 151* 164*  BILITOT 0.4 0.4  PROT 5.8* 6.0  ALBUMIN 2.4* 2.4*   No results found for this basename: LIPASE:2,AMYLASE:2 in the last 72 hours  Basename 12/21/12 0413  WBC 6.8  NEUTROABS 5.2  HGB 11.9*  HCT 36.7  MCV 90.6  PLT 236   No results found for this basename: CKTOTAL:3,CKMB:3,CKMBINDEX:3,TROPONINI:3 in the last 72 hours No components found with this basename: POCBNP:3 No results found for this basename: DDIMER:2 in the last 72 hours No results found for this basename: HGBA1C:2 in  the last 72 hours  Basename 12/21/12 0413  CHOL 87  HDL --  LDLCALC --  TRIG 108  CHOLHDL --  LDLDIRECT --   No results found for this basename: TSH,T4TOTAL,FREET3,T3FREE,THYROIDAB in the last 72 hours No results found for this basename: VITAMINB12:2,FOLATE:2,FERRITIN:2,TIBC:2,IRON:2,RETICCTPCT:2 in the last 72 hours  Studies/Results: No results found. Medications: Scheduled Meds:   . enoxaparin (LOVENOX) injection  40 mg Subcutaneous Q24H  . insulin aspart  0-9 Units Subcutaneous Q4H  . pantoprazole (PROTONIX) IV  40 mg Intravenous Q24H   Continuous Infusions:   . 0.9 % NaCl with KCl 40 mEq / L 40 mL/hr at 12/23/12 0448  . TPN (CLINIMIX) +/- additives 60 mL/hr at 12/22/12 1820   And  . fat emulsion 250 mL (12/22/12 1822)   PRN Meds:.acetaminophen, acetaminophen, hydrALAZINE, HYDROcodone-acetaminophen, LORazepam, menthol-cetylpyridinium, morphine injection, ondansetron (ZOFRAN) IV, ondansetron, phenol, promethazine, sodium chloride, sodium chloride  Assessment/Plan: SBO improved clinically and radiographically. Management per general surgery.  Would expect that G-tube to be clamped this morning for another trial of managing her own secretions  Metatstatic Carcinoid with incease tumor burden - per Dr. Mancel Bale FEN Low K/ Mg- replaced and now normal per TPN Pain control IV morphine  for now, change to po when NG tube clamped  Hypertension BP moderately high systolically off meds, probably discharge off amlodipine and on losartan but will decide at discharge Nasal discharge - try saline spray rinses  Disposition, surgery note noted, possible d/c as early as Sunday or Monday if tube out and tolerating diet   LOS: 4 days   Lori Dennis 12/23/2012, 8:46 AM

## 2012-12-24 LAB — GLUCOSE, CAPILLARY
Glucose-Capillary: 120 mg/dL — ABNORMAL HIGH (ref 70–99)
Glucose-Capillary: 165 mg/dL — ABNORMAL HIGH (ref 70–99)
Glucose-Capillary: 149 mg/dL — ABNORMAL HIGH (ref 70–99)

## 2012-12-24 LAB — BASIC METABOLIC PANEL
BUN: 15 mg/dL (ref 6–23)
Chloride: 100 mEq/L (ref 96–112)
GFR calc Af Amer: >90 mL/min (ref >90)
GFR calc non Af Amer: 89 mL/min — ABNORMAL LOW (ref >90)
Potassium: 4.2 mEq/L (ref 3.5–5.1)

## 2012-12-24 MED ORDER — CLINIMIX E/DEXTROSE (5/15) 5 % IV SOLN
INTRAVENOUS | Status: DC
  Administered 2012-12-24: 18:00:00 via INTRAVENOUS
  Filled 2012-12-24: qty 2000

## 2012-12-24 MED ORDER — POTASSIUM CHLORIDE IN NACL 40-0.9 MEQ/L-% IV SOLN
INTRAVENOUS | Status: DC
  Administered 2012-12-25: 20 mL/h via INTRAVENOUS
  Filled 2012-12-24 (×3): qty 1000

## 2012-12-24 MED ORDER — POTASSIUM CHLORIDE IN NACL 40-0.9 MEQ/L-% IV SOLN
INTRAVENOUS | Status: AC
  Filled 2012-12-24 (×2): qty 1000

## 2012-12-24 NOTE — Progress Notes (Signed)
Subjective: Patient feeling better today.  Still with sore throat from NG tube.  Cepacol spray and lozenges are working relatively well.  Not great.  Having more output per her ostomy.  No abdominal distention.  No abdominal pain currently  Objective: Weight change:   Intake/Output Summary (Last 24 hours) at 12/24/12 0910 Last data filed at 12/24/12 0737  Gross per 24 hour  Intake   1000 ml  Output   1975 ml  Net   -975 ml   Filed Vitals:   12/23/12 0645 12/23/12 1347 12/23/12 2130 12/24/12 0520  BP: 161/87 164/72 160/89 157/91  Pulse: 73 96 69 75  Temp: 97.7 F (36.5 C) 97.7 F (36.5 C) 98.2 F (36.8 C) 97.9 F (36.6 C)  TempSrc: Oral Oral Oral Oral  Resp: 14 16 18 18   Height:      Weight:      SpO2: 97% 97% 97% 97%   General appearance: alert and cooperative  GI: soft, NT, decreased BS, nondistended. Ostomy with liquid brown stool  Ext: Without edema or cyanosis  Neuro: Alert and nonfocal   Lab Results:  Basename 12/24/12 0500 12/23/12 0442 12/22/12 0440  NA 136 137 --  K 4.2 3.9 --  CL 100 99 --  CO2 28 31 --  GLUCOSE 144* 160* --  BUN 15 12 --  CREATININE 0.69 0.65 --  CALCIUM 8.6 8.3* --  MG -- 1.9 1.5  PHOS -- 2.6 2.4    Basename 12/22/12 0440  AST 20  ALT 21  ALKPHOS 151*  BILITOT 0.4  PROT 5.8*  ALBUMIN 2.4*   No results found for this basename: LIPASE:2,AMYLASE:2 in the last 72 hours No results found for this basename: WBC:2,NEUTROABS:2,HGB:2,HCT:2,MCV:2,PLT:2 in the last 72 hours No results found for this basename: CKTOTAL:3,CKMB:3,CKMBINDEX:3,TROPONINI:3 in the last 72 hours No components found with this basename: POCBNP:3 No results found for this basename: DDIMER:2 in the last 72 hours No results found for this basename: HGBA1C:2 in the last 72 hours No results found for this basename: CHOL:2,HDL:2,LDLCALC:2,TRIG:2,CHOLHDL:2,LDLDIRECT:2 in the last 72 hours No results found for this basename: TSH,T4TOTAL,FREET3,T3FREE,THYROIDAB in the last  72 hours No results found for this basename: VITAMINB12:2,FOLATE:2,FERRITIN:2,TIBC:2,IRON:2,RETICCTPCT:2 in the last 72 hours  Studies/Results: No results found. Medications: Scheduled Meds:   . enoxaparin (LOVENOX) injection  40 mg Subcutaneous Q24H  . insulin aspart  0-15 Units Subcutaneous Q4H  . pantoprazole (PROTONIX) IV  40 mg Intravenous Q24H   Continuous Infusions:   . 0.9 % NaCl with KCl 40 mEq / L    . 0.9 % NaCl with KCl 40 mEq / L 40 mL/hr (12/24/12 0745)  . TPN (CLINIMIX) +/- additives 60 mL/hr at 12/23/12 1751  . TPN (CLINIMIX) +/- additives     PRN Meds:.acetaminophen, acetaminophen, diphenhydrAMINE, hydrALAZINE, HYDROcodone-acetaminophen, LORazepam, menthol-cetylpyridinium, morphine injection, ondansetron (ZOFRAN) IV, ondansetron, phenol, promethazine, sodium chloride, sodium chloride  Assessment/Plan:  SBO improved clinically and radiographically. Management per general surgery. Metatstatic Carcinoid with incease tumor burden - per Dr. Mancel Bale  FEN - continue TPN, electrolytes okay Pain control IV morphine for now, change to po when NG tube clamped  Hypertension BP moderately high systolically off meds, probably discharge off amlodipine and on losartan but will decide at discharge  Nasal discharge -  saline spray rinses seem to be helping Disposition - as per surgery    LOS: 5 days   Lori Dennis 12/24/2012, 9:10 AM

## 2012-12-24 NOTE — Progress Notes (Signed)
PARENTERAL NUTRITION CONSULT NOTE - FOLLOW UP  Pharmacy Consult for TNA Indication: SBO  No Known Allergies  Patient Measurements: Height: 5\' 3"  (160 cm) Weight: 148 lb 13 oz (67.5 kg) IBW/kg (Calculated) : 52.4  Adjusted Body Weight: 57kg Usual Weight: unknown  Vital Signs: Temp: 97.9 F (36.6 C) (01/05 0520) Temp src: Oral (01/05 0520) BP: 157/91 mmHg (01/05 0520) Pulse Rate: 75  (01/05 0520) Intake/Output from previous day: 01/04 0701 - 01/05 0700 In: 680 [I.V.:680] Out: 1975 [Urine:775; Emesis/NG output:1200] Intake/Output from this shift:    Labs: No results found for this basename: WBC:3,HGB:3,HCT:3,PLT:3,APTT:3,INR:3 in the last 72 hours   Basename 12/24/12 0500 12/23/12 0442 12/22/12 0440  NA 136 137 137  K 4.2 3.9 3.3*  CL 100 99 99  CO2 28 31 30   GLUCOSE 144* 160* 181*  BUN 15 12 8   CREATININE 0.69 0.65 0.64  LABCREA -- -- --  CREAT24HRUR -- -- --  CALCIUM 8.6 8.3* 7.9*  MG -- 1.9 1.5  PHOS -- 2.6 2.4  PROT -- -- 5.8*  ALBUMIN -- -- 2.4*  AST -- -- 20  ALT -- -- 21  ALKPHOS -- -- 151*  BILITOT -- -- 0.4  BILIDIR -- -- --  IBILI -- -- --  PREALBUMIN -- -- --  TRIG -- -- --  CHOLHDL -- -- --  CHOL -- -- --   Estimated Creatinine Clearance: 63.8 ml/min (by C-G formula based on Cr of 0.69).    Basename 12/24/12 0410 12/24/12 0012 12/23/12 2024  GLUCAP 120* 161* 136*    Medications:  Scheduled:     . enoxaparin (LOVENOX) injection  40 mg Subcutaneous Q24H  . insulin aspart  0-15 Units Subcutaneous Q4H  . pantoprazole (PROTONIX) IV  40 mg Intravenous Q24H  . [DISCONTINUED] insulin aspart  0-9 Units Subcutaneous Q4H   Infusions:     . 0.9 % NaCl with KCl 40 mEq / L 40 mL/hr at 12/23/12 0448  . [EXPIRED] TPN (CLINIMIX) +/- additives 60 mL/hr at 12/22/12 1820   And  . [EXPIRED] fat emulsion 250 mL (12/22/12 1822)  . TPN (CLINIMIX) +/- additives 60 mL/hr at 12/23/12 1751    Insulin Requirements in the past 24 hours:  13 units  moderate SSI scale  Current Nutrition:  NPO Clinimix E5/20 at 49ml/hr Lipids 20% at 31ml/hr MWF  IVF: NS with 20K at 66ml/hr  Nutritional Goals:  Estimated needs: 1400 -1675 kCal, 80-100 grams of protein per day  Clinimix E5/15 at 75 ml/hr + Lipids 10 ml/hr MWF to provide: 90 g/day protein and 1758 Kcal/day MWF, 1278 Kcal/day STTHS(Avg. 1484 Kcal/day weekly).   Assessment: 60 yoF with hx metastatic Carcinioid tumor. CT for staging on 12/30 shows progression of disease with stomach and small bowel distention. Per MD notes, appears obstruction improving clinically and radiographically.   Chem: Lytes wnl, Scr wnl (resolved arf), elevated glucose  CBG: better with increased SSI scale  LFTs: AlkPhos elevated but improved. Tbili, AST/ALT WNL (1/3)  Lipids: WNL 108 (1/2)  Prealbumin: 9.7 1/2 - low, reflecting depleted protein stores prior to initiation of TNA   Plan:   Continue moderate scale SSI  Increase Clinimix E5/15 to 3ml/hr (goal).   Continue to monitor electrolytes  Fat emulsion at 10 ml/hr (MWF only due to ongoing shortage).  Standard multivitamins and trace elements (MWF only due to ongoing shortage).  TNA lab panels on Mon/Thurs  F/U daily for plan of care, diet advancement  Gwen Her PharmD  [phone]  12/24/2012 7:23 AM

## 2012-12-24 NOTE — Progress Notes (Signed)
Patient ID: Lori Dennis, female   DOB: [birth year], 67 y.o.   MRN: [id number]    Subjective: Had additional stool and flatus in bag.  No n/v.    Objective: Vital signs in last 24 hours: Temp:  [97.7 F (36.5 C)-98.2 F (36.8 C)] 97.9 F (36.6 C) (01/05 0520) Pulse Rate:  [69-96] 75  (01/05 0520) Resp:  [16-18] 18  (01/05 0520) BP: (157-164)/(72-91) 157/91 mmHg (01/05 0520) SpO2:  [97 %] 97 % (01/05 0520) Last BM Date: 12/20/12  Intake/Output from previous day: 01/04 0701 - 01/05 0700 In: 1000 [I.V.:1000] Out: 1975 [Urine:775; Emesis/NG output:1200] Intake/Output this shift:    PE: Gen:  A&O times 3 Abd: soft, NT, ND, ostomy with moderate amt pasty brown stool.  Small amt flatus in bag.     Lab Results:  No results found for this basename: WBC:2,HGB:2,HCT:2,PLT:2 in the last 72 hours BMET  Basename 12/24/12 0500 12/23/12 0442  NA 136 137  K 4.2 3.9  CL 100 99  CO2 28 31  GLUCOSE 144* 160*  BUN 15 12  CREATININE 0.69 0.65  CALCIUM 8.6 8.3*   PT/INR No results found for this basename: LABPROT:2,INR:2 in the last 72 hours CMP     Component Value Date/Time   NA 136 12/24/2012 0500   NA 141 12/11/2012 1245   NA 144 06/26/2012 1401   K 4.2 12/24/2012 0500   K 3.4* 12/11/2012 1245   K 5.1* 06/26/2012 1401   CL 100 12/24/2012 0500   CL 96* 12/11/2012 1245   CL 97* 06/26/2012 1401   CO2 28 12/24/2012 0500   CO2 30* 12/11/2012 1245   CO2 29 06/26/2012 1401   GLUCOSE 144* 12/24/2012 0500   GLUCOSE 100* 12/11/2012 1245   GLUCOSE 131* 06/26/2012 1401   BUN 15 12/24/2012 0500   BUN 13.0 12/11/2012 1245   BUN 20 06/26/2012 1401   CREATININE 0.69 12/24/2012 0500   CREATININE 1.0 12/11/2012 1245   CREATININE 1.0 06/26/2012 1401   CALCIUM 8.6 12/24/2012 0500   CALCIUM 7.7* 12/11/2012 1245   CALCIUM 8.2 06/26/2012 1401   PROT 5.8* 12/22/2012 0440   PROT 7.0 12/11/2012 1245   ALBUMIN 2.4* 12/22/2012 0440   ALBUMIN 3.6 12/11/2012 1245   AST 20 12/22/2012 0440   AST 62* 12/11/2012 1245   ALT 21  12/22/2012 0440   ALT 63* 12/11/2012 1245   ALKPHOS 151* 12/22/2012 0440   ALKPHOS 280* 12/11/2012 1245   BILITOT 0.4 12/22/2012 0440   BILITOT 0.98 12/11/2012 1245   GFRNONAA 89* 12/24/2012 0500   GFRAA >90 12/24/2012 0500   Lipase     Component Value Date/Time   LIPASE 28 12/19/2012 1430       Studies/Results: No results found.  Anti-infectives: Anti-infectives    None       Assessment/Plan  1. SBO 2. Carcinoid tumor of intestines with carcinomatosis 3. Hypokalemia  Plan: SBO - continues to improve.  Will do clamping trials of NGT today.     Possible d/c NGT tomorrow.   Continue TNA for severe protein calorie malnutrition.    Surgery would be difficult due to the presence of metastatic carcinoid and prior need to bypass small bowel as opposed to resection.  High risk for enteric fistula formation.      LOS: 5 days    Jaxson Anglin 12/24/2012, 9:57 AM Pager: [phone]

## 2012-12-25 LAB — COMPREHENSIVE METABOLIC PANEL
ALT: 20 U/L (ref 0–35)
AST: 24 U/L (ref 0–37)
Alkaline Phosphatase: 152 U/L — ABNORMAL HIGH (ref 39–117)
CO2: 29 mEq/L (ref 19–32)
Chloride: 97 mEq/L (ref 96–112)
Creatinine, Ser: 0.67 mg/dL (ref 0.50–1.10)
GFR calc non Af Amer: 90 mL/min — ABNORMAL LOW (ref >90)
Potassium: 4.2 mEq/L (ref 3.5–5.1)
Total Bilirubin: 0.5 mg/dL (ref 0.3–1.2)

## 2012-12-25 LAB — DIFFERENTIAL
Basophils Absolute: 0.1 10*3/uL (ref 0.0–0.1)
Basophils Relative: 1 % (ref 0–1)
Eosinophils Absolute: 0.3 10*3/uL (ref 0.0–0.7)
Eosinophils Relative: 3 % (ref 0–5)
Monocytes Absolute: 0.6 10*3/uL (ref 0.1–1.0)
Monocytes Relative: 6 % (ref 3–12)

## 2012-12-25 LAB — CBC
HCT: 36.8 % (ref 36.0–46.0)
MCH: 29.8 pg (ref 26.0–34.0)
MCHC: 33.2 g/dL (ref 30.0–36.0)
MCV: 89.8 fL (ref 78.0–100.0)
RDW: 13.5 % (ref 11.5–15.5)

## 2012-12-25 LAB — GLUCOSE, CAPILLARY
Glucose-Capillary: 145 mg/dL — ABNORMAL HIGH (ref 70–99)
Glucose-Capillary: 147 mg/dL — ABNORMAL HIGH (ref 70–99)
Glucose-Capillary: 111 mg/dL — ABNORMAL HIGH (ref 70–99)

## 2012-12-25 LAB — PHOSPHORUS: Phosphorus: 3.3 mg/dL (ref 2.3–4.6)

## 2012-12-25 MED ORDER — CLINIMIX E/DEXTROSE (5/15) 5 % IV SOLN: INTRAVENOUS | Status: AC

## 2012-12-25 MED ORDER — BOOST / RESOURCE BREEZE PO LIQD
1.0000 | Freq: Three times a day (TID) | ORAL | Status: DC
  Administered 2012-12-25 – 2012-12-26 (×2): 1 via ORAL

## 2012-12-25 MED ORDER — LOSARTAN POTASSIUM 50 MG PO TABS
100.0000 mg | ORAL_TABLET | Freq: Every day | ORAL | Status: DC
  Administered 2012-12-25 – 2012-12-26 (×2): 100 mg via ORAL
  Filled 2012-12-25 (×2): qty 2

## 2012-12-25 NOTE — Progress Notes (Addendum)
PARENTERAL NUTRITION CONSULT NOTE - FOLLOW UP  Pharmacy Consult for TNA Indication: SBO  No Known Allergies  Patient Measurements: Height: 5\' 3"  (160 cm) Weight: 148 lb 13 oz (67.5 kg) IBW/kg (Calculated) : 52.4  Adjusted Body Weight: 57kg Usual Weight: unknown  Intake/Output from previous day: 01/05 0701 - 01/06 0700 In: 1547.5 [P.O.:240; I.V.:890; NG/GT:80; TPN:337.5] Out: 2100 [Urine:1750; Emesis/NG output:150; Stool:200]  Labs:  [facility] 12/25/12 0423  WBC 8.6  HGB 12.2  HCT 36.8  PLT 273  APTT --  INR --    Basename 12/25/12 0423 12/24/12 0500 12/23/12 0442  NA 132* 136 137  K 4.2 4.2 3.9  CL 97 100 99  CO2 29 28 31   GLUCOSE 152* 144* 160*  BUN 17 15 12   CREATININE 0.67 0.69 0.65  LABCREA -- -- --  CREAT24HRUR -- -- --  CALCIUM 8.7 8.6 8.3*  MG 1.7 -- 1.9  PHOS 3.3 -- 2.6  PROT 6.0 -- --  ALBUMIN 2.4* -- --  AST 24 -- --  ALT 20 -- --  ALKPHOS 152* -- --  BILITOT 0.5 -- --  BILIDIR -- -- --  IBILI -- -- --  PREALBUMIN -- -- --  TRIG -- -- --  CHOLHDL -- -- --  CHOL -- -- --   Estimated Creatinine Clearance: 63.8 ml/min (by C-G formula based on Cr of 0.67).    Basename 12/25/12 0413 12/25/12 0009 12/24/12 2002  GLUCAP 145* 178* 134*   Insulin Requirements in the past 24 hours:  12 units moderate SSI scale q4h  Nutritional Goals:  RD recs 12/21/12: 1400 -1675 kCal, 80-100 grams of protein per day, 1.4 - 1.6L fluid per day Clinimix E 5/15 @ 75 ml/hr + IVF 20% on MWF will provide:  90 g/day protein and 1758 Kcal/day MWF, 1278 Kcal/day STTHS for an average of 1484 Kcal/day weekly.    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 CT for staging on 12/30 shows progression of disease in the liver with stomach and small bowel distention.  Surgery on board, holding  surgical intervention and trying conservative management. TNA started 12/20/12. Per MD notes 1/4, SBO improved clinically and radiographically. NGT clamping trials started 1/5.  Diet advanced to clear liquid 1/5  1/6:  TNA Day #6, 240 cc po intake and 150 cc NG output recorded 1/5 - possible d/c NGT today.  Ileostomy output = 200 cc and UOP 1750 cc yesterday. Per MD notes, possible discharge in AM.  Labs: Electrolytes:  Na slightly down, other lytes okay Renal Function: ARF resolved, Scr remains wnl/stable, good UOP Hepatic Function: Alk phos improving (152), AST/ALT wnl Pre-Albumin: 9.7 (1/2) - reflecting depleted protein stores prior to initiation of TNA.  Level of 1/6 still pending Triglycerides: wnl 1/6  CBGs: fairly controlled on moderate SSI q4h   Plan:    Continue Clinimix E 5/15 @ 75 ml/hr (goal rate)  TNA to contain IV fat emulsion 20% at 10 ml/hr on MWF only due to ongoing shortage  Standard multivitamins and trace elements on MWF only due to ongoing shortage  Continue moderate SSI and CBG checks q4h  TNA labs Monday/Thursdays  MD:  What is the plan for TNA given possible discharge soon?  Geoffry Paradise, PharmD, BCPS Pager: [phone] 7:40 AM Pharmacy #: [phone]  Addendum:  Diet advanced to full liquid today.  Per conversation with Dr. Biagio Quint, ok to wean TNA.    Plan:  Change TNA to 40 ml/hr starting now then turn off at 1800 tonight.  Will d/c all TNA labs.  Defer CBGs monitoring and IVF needs to MD.  Geoffry Paradise, PharmD, BCPS Pager: [phone] 9:30 AM Pharmacy #: [phone]

## 2012-12-25 NOTE — Progress Notes (Signed)
Patient ID: Lori Dennis, female   DOB: 02-23-1946, 67 y.o.   MRN: [id number]    Subjective: Pt feels better today.  Had some output and air in bag.  No nausea with NGT clamped and tolerating some clear liquids.  Objective: Vital signs in last 24 hours: Temp:  [97.6 F (36.4 C)-98.4 F (36.9 C)] 97.6 F (36.4 C) (01/06 0522) Pulse Rate:  [71-107] 71  (01/06 0522) Resp:  [16-18] 16  (01/06 0522) BP: (150-202)/(75-84) 150/83 mmHg (01/06 0522) SpO2:  [95 %-98 %] 96 % (01/06 0522) Last BM Date: 12/20/12  Intake/Output from previous day: 01/05 0701 - 01/06 0700 In: 2147.5 [P.O.:240; I.V.:890; NG/GT:80; TPN:937.5] Out: 2100 [Urine:1750; Emesis/NG output:150; Stool:200] Intake/Output this shift: Total I/O In: 480 [P.O.:480] Out: -   PE: Abd: soft, NT, ND, bag empty right now as she just emptied it. Heart: regular  Lab Results:   Basename 12/25/12 0423  WBC 8.6  HGB 12.2  HCT 36.8  PLT 273   BMET  Basename 12/25/12 0423 12/24/12 0500  NA 132* 136  K 4.2 4.2  CL 97 100  CO2 29 28  GLUCOSE 152* 144*  BUN 17 15  CREATININE 0.67 0.69  CALCIUM 8.7 8.6   PT/INR No results found for this basename: LABPROT:2,INR:2 in the last 72 hours CMP     Component Value Date/Time   NA 132* 12/25/2012 0423   NA 141 12/11/2012 1245   NA 144 06/26/2012 1401   K 4.2 12/25/2012 0423   K 3.4* 12/11/2012 1245   K 5.1* 06/26/2012 1401   CL 97 12/25/2012 0423   CL 96* 12/11/2012 1245   CL 97* 06/26/2012 1401   CO2 29 12/25/2012 0423   CO2 30* 12/11/2012 1245   CO2 29 06/26/2012 1401   GLUCOSE 152* 12/25/2012 0423   GLUCOSE 100* 12/11/2012 1245   GLUCOSE 131* 06/26/2012 1401   BUN 17 12/25/2012 0423   BUN 13.0 12/11/2012 1245   BUN 20 06/26/2012 1401   CREATININE 0.67 12/25/2012 0423   CREATININE 1.0 12/11/2012 1245   CREATININE 1.0 06/26/2012 1401   CALCIUM 8.7 12/25/2012 0423   CALCIUM 7.7* 12/11/2012 1245   CALCIUM 8.2 06/26/2012 1401   PROT 6.0 12/25/2012 0423   PROT 7.0 12/11/2012 1245   ALBUMIN  2.4* 12/25/2012 0423   ALBUMIN 3.6 12/11/2012 1245   AST 24 12/25/2012 0423   AST 62* 12/11/2012 1245   ALT 20 12/25/2012 0423   ALT 63* 12/11/2012 1245   ALKPHOS 152* 12/25/2012 0423   ALKPHOS 280* 12/11/2012 1245   BILITOT 0.5 12/25/2012 0423   BILITOT 0.98 12/11/2012 1245   GFRNONAA 90* 12/25/2012 0423   GFRAA >90 12/25/2012 0423   Lipase     Component Value Date/Time   LIPASE 28 12/19/2012 1430       Studies/Results: No results found.  Anti-infectives: Anti-infectives    None       Assessment/Plan  1. SBO 2. 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 Dc NGT 2. Cont clear liquids.  Will try full liquids tonight for supper.  Would slowly advance diet as patient may tolerate liquids, but then as diet advances may regress.  Hopefully not though. 3. She may follow up with our office prn for high output ileostomy if this continues to be a problem in the future and her octreotide and over the counter imodium do not control it. 4. She was coughing some this morning.  She thinks it's related to the  NGT, but I told her if she notices it with drinking her clear liquids she needs to let us know so we can order a swallow study.   LOS: 6 days    OSBORNE,KELLY E 12/25/2012, 9:04 AM Pager: [phone] Pt. Seen and examined.  Gas and liquid in ostomy.  Feeling as though she is getting back to normal.  Tolerating clears.  NG out now.  Advance diet as tolerated and wean TPN to off.

## 2012-12-25 NOTE — Progress Notes (Signed)
IP PROGRESS NOTE  Subjective:   Patient is feeling better. NJ tube is out.  She is tolerating clears.   Objective:  Vital signs in last 24 hours: Temp:  [97.6 F (36.4 C)-98.4 F (36.9 C)] 97.6 F (36.4 C) (01/06 0522) Pulse Rate:  [71-107] 71  (01/06 0522) Resp:  [16-18] 16  (01/06 0522) BP: (150-202)/(75-84) 150/83 mmHg (01/06 0522) SpO2:  [95 %-98 %] 96 % (01/06 0522) Weight change:  Last BM Date: 12/20/12  Intake/Output from previous day: 01/05 0701 - 01/06 0700 In: 2147.5 [P.O.:240; I.V.:890; NG/GT:80; TPN:937.5] Out: 2100 [Urine:1750; Emesis/NG output:150; Stool:200]  Mouth: mucous membranes moist, pharynx normal without lesions Resp: clear to auscultation bilaterally Cardio: regular rate and rhythm, S1, S2 normal, no murmur, click, rub or gallop GI: abnormal findings:  hyperactive bowel sounds Extremities: extremities normal, atraumatic, no cyanosis or edema    Lab Results:  Basename 12/25/12 0423  WBC 8.6  HGB 12.2  HCT 36.8  PLT 273    BMET  Basename 12/25/12 0423 12/24/12 0500  NA 132* 136  K 4.2 4.2  CL 97 100  CO2 29 28  GLUCOSE 152* 144*  BUN 17 15  CREATININE 0.67 0.69  CALCIUM 8.7 8.6    Studies/Results: No results found.  Medications: I have reviewed the patient's current medications.  Assessment/Plan:  1. Metastatic low grade carcinoid tumor. She has been on monthly Sandostatin and S/P 6 cycles of xeloda/temozolamide beginnning 09/06/2010.  Her CT scan on 12/30 shows mild progression of disease in the liver.  Treatment options  can be considered if her clinical status improves up to Dr. Kalman Drape discretion.   2. Bowel Obstruction: Clinically improved. It appears she is getting close to discharge.     LOS: 6 days   Wil Slape 12/25/2012, 9:15 AM

## 2012-12-25 NOTE — Progress Notes (Signed)
NUTRITION FOLLOW UP  Intervention:   - Encouraged increased intake - noted MD plans to advance diet as tolerated - Will continue to monitor  Nutrition Dx:   Inadequate oral intake related to bowel obstruction as evidenced by NPO - no longer appropriate as diet advanced.   New nutrition dx: Inadequate oral intake related to clear liquid diet as evidenced by diet order.   Goal:   TPN to meet >/=90% of estimated nutritional needs - not met, TPN being weaned and d/c today.   New goal: Advance diet as tolerated to regular diet.  Monitor:   Weights, labs, intake, BM  Assessment:   NGT d/c today. Diet advanced to clear liquids which pt reports she is tolerating with nausea. Plan is to wean TPN and d/c TPN tonight - discussed with pharmacist. Bowel obstruction clinically improved per MD. Last BM 1/1. Noted plans for possible discharge tomorrow. Pt with normal potassium, phosphorus, and magnesium. PALB improved from 9.7 mg/dL on 1/2 now 11.9 mg/dL today. CBGs elevated, getting moderate sliding scale insulin.   Diet: Clear liquid  TPN: Clinimix 5/15 @ 40 ml/hr.  Lipids (20% IVFE @ 10 ml/hr), multivitamins, and trace elements are provided 3 times weekly (MWF) due to national backorder.  Provides 888 kcal and 48 grams protein daily (based on weekly average).  Meets 63% minimum estimated kcal and 60% minimum estimated protein needs.  Additional IVF with NS @ 10-20 ml/hr.  Height: Ht Readings from Last 1 Encounters:  12/19/12 5\' 3"  (1.6 m)    Weight Status:   Wt Readings from Last 1 Encounters:  12/19/12 148 lb 13 oz (67.5 kg)    Re-estimated needs:  Kcal: 1400-1675 Protein: 80-100g Fluid: 1.4-1.6L/day   Diet Order: Clear Liquid   Intake/Output Summary (Last 24 hours) at 12/25/12 1113 Last data filed at 12/25/12 0853  Gross per 24 hour  Intake 2537.5 ml  Output   1950 ml  Net  587.5 ml    Last BM: 1/1   Labs:   Lab 12/25/12 0423 12/24/12 0500 12/23/12 0442 12/22/12  0440  NA 132* 136 137 --  K 4.2 4.2 3.9 --  CL 97 100 99 --  CO2 29 28 31  --  BUN 17 15 12  --  CREATININE 0.67 0.69 0.65 --  CALCIUM 8.7 8.6 8.3* --  MG 1.7 -- 1.9 1.5  PHOS 3.3 -- 2.6 2.4  GLUCOSE 152* 144* 160* --    CBG (last 3)   Basename 12/25/12 0748 12/25/12 0413 12/25/12 0009  GLUCAP 147* 145* 178*    Scheduled Meds:   . enoxaparin (LOVENOX) injection  40 mg Subcutaneous Q24H  . insulin aspart  0-15 Units Subcutaneous Q4H  . losartan  100 mg Oral Daily  . pantoprazole (PROTONIX) IV  40 mg Intravenous Q24H    Continuous Infusions:   . 0.9 % NaCl with KCl 40 mEq / L    . TPN (CLINIMIX) +/- additives 40 mL/hr at 12/25/12 [address] [place], [address], [place] [phone] Pager [phone] After Hours Pager

## 2012-12-25 NOTE — Progress Notes (Signed)
Subjective: Tolerated clamping of NG tube  Objective: Vital signs in last 24 hours: Temp:  [97.6 F (36.4 C)-98.4 F (36.9 C)] 97.6 F (36.4 C) (01/06 0522) Pulse Rate:  [71-107] 71  (01/06 0522) Resp:  [16-18] 16  (01/06 0522) BP: (150-202)/(75-84) 150/83 mmHg (01/06 0522) SpO2:  [95 %-98 %] 96 % (01/06 0522) Weight change:  Last BM Date: 12/20/12  Intake/Output from previous day: 01/05 0701 - 01/06 0700 In: 1177.5 [P.O.:240; I.V.:570; NG/GT:30; TPN:337.5] Out: 1650 [Urine:1450; Emesis/NG output:150; Stool:50] Intake/Output this shift: Total I/O In: 527.5 [I.V.:160; NG/GT:30; TPN:337.5] Out: 675 [Urine:625; Stool:50]  General appearance: alert and cooperative Resp: clear to auscultation bilaterally Cardio: regular rate and rhythm, S1, S2 normal, no murmur, click, rub or gallop GI: soft, non-tender; bowel sounds normal; no masses,  no organomegaly  Lab Results:  Basename 12/25/12 0423  WBC 8.6  HGB 12.2  HCT 36.8  PLT 273   BMET  Basename 12/25/12 0423 12/24/12 0500  NA 132* 136  K 4.2 4.2  CL 97 100  CO2 29 28  GLUCOSE 152* 144*  BUN 17 15  CREATININE 0.67 0.69  CALCIUM 8.7 8.6    Studies/Results: No results found.  Medications: I have reviewed the patient's current medications.  Assessment/Plan: SBO, tolerated clamping of NG tube, some clears. Management per general surgery.  Metatstatic Carcinoid with incease tumor burden - per Dr. Mancel Bale  FEN - continue TPN, electrolytes okay Hypertension BP moderately high systolically off meds,restart losartan   Nasal discharge - saline spray rinses seem to be helping  Disposition - possible discharge in AM   LOS: 6 days   Ashok Sawaya JOSEPH 12/25/2012, 6:53 AM

## 2012-12-26 DIAGNOSIS — C7B8 Other secondary neuroendocrine tumors: Secondary | ICD-10-CM

## 2012-12-26 LAB — BASIC METABOLIC PANEL
BUN: 17 mg/dL (ref 6–23)
Calcium: 8.7 mg/dL (ref 8.4–10.5)
Chloride: 97 mEq/L (ref 96–112)
Creatinine, Ser: 0.73 mg/dL (ref 0.50–1.10)
GFR calc Af Amer: >90 mL/min (ref >90)

## 2012-12-26 LAB — GLUCOSE, CAPILLARY

## 2012-12-26 NOTE — Discharge Summary (Signed)
Physician Discharge Summary  Patient ID: Lori Dennis MRN: [id number] DOB/AGE: Apr 19, 1946 67 y.o.  Admit date: 12/19/2012 Discharge date: 12/26/2012  Admission Diagnoses: Small bowel obstruction Carcinoid tumor the intestine Hypertension  acute renal failure Hypokalemia Protein calorie malnutrition    Discharge Diagnoses:  Principal Problem:  *SBO (small bowel obstruction) Active Problems:  Carcinoid tumor of intestine  HTN (hypertension)  Acute renal failure  Hypokalemia  protein calorie malnutrition     Discharged Condition: good  Hospital Course: The patient was admitted on December 31 complaining of nausea and vomiting. A CT scan was consistent with small bowel obstruction. In ER she was found to have acute renal failure, hypokalemia and dehydration. At admission her BUN is 18 creatinine 1.84, albumin 3.4. A CT scan of the abdomen showed evidence of small bowel obstruction the point of obstruction. A BE in the right lower quadrant, mild progression of a hypervascular liver metastasis and stable appearance of person meal and omental disease and no change in complex mass in the pelvis. The patient was seen by the general surgery service and oncology service. An NG tube was placed and she was treated with NG suction, made n.p.o. and given total parenteral nutrition. She was rehydrated and at discharge BUN and creatinine were down to 17 and 0.67. Her prealbumin level was rising. And by Sunday January 5 her NG tube was clamped. She tolerated this well. She is placed on clear liquids on Monday, January 6 she tolerated these. She was given full liquids and then regular diet on January 7 and she tolerated this. She had good ostomy output. She was seen by the nutrition service and TPN was weaned off after 5 days of treatment on January 6.  Consults: hematology/oncology and general surgery  Significant Diagnostic Studies: labs: As above and radiology: CT scan: Abdomen and pelvis as  above  Treatments: IV hydration, analgesia: Morphine, insulin: Humalog and TPN  Discharge Exam: Blood pressure 154/84, pulse 71, temperature 98 F (36.7 C), temperature source Oral, resp. rate 16, height 5\' 3"  (1.6 m), weight 67.5 kg (148 lb 13 oz), SpO2 96.00%. GI: soft, non-tender; bowel sounds normal; no masses,  no organomegaly  Disposition:  home  Discharge Orders    Future Appointments: Provider: Department: Dept Phone: Center:   12/27/2012 9:00 AM Anabel Bene, RD [facility] MEDICAL ONCOLOGY [phone] None   12/29/2012 8:30 AM Ladene Artist, MD [facility] MEDICAL ONCOLOGY [phone] None   12/29/2012 9:15 AM [facility] MEDICAL ONCOLOGY [phone] None       Medication List     As of 12/26/2012  6:32 AM    TAKE these medications         amLODipine 10 MG tablet   Commonly known as: NORVASC   Take 10 mg by mouth daily.      LOSARTAN POTASSIUM PO   Take 100 mg by mouth daily.      metoCLOPramide 10 MG tablet   Commonly known as: REGLAN   Take 1 tablet (10 mg total) by mouth 3 (three) times daily before meals.      multivitamin capsule   Take 1 capsule by mouth daily.      ondansetron 8 MG tablet   Commonly known as: ZOFRAN   Take 1 tablet (8 mg total) by mouth every 12 (twelve) hours as needed for nausea.      pantoprazole 40 MG tablet   Commonly known as: PROTONIX  Take 1 tablet (40 mg total) by mouth daily.      promethazine 12.5 MG tablet   Commonly known as: PHENERGAN   Take 1/2 - 1 tablet PO Q6h prn nausea      SANDOSTATIN IJ   Inject as directed. Pt gets this injection every 3 weeks. Cant confirm what the dosage is. Followed by Dr Alcide Evener      Vitamin D3 1000 UNITS Caps   Take 1 capsule by mouth daily.           Follow-up Information    Follow up with Thornton Papas, MD. In 3 days.   Contact information:   [address] [place] [address] [phone]        Follow up with Lillia Mountain, MD. In 2 weeks.   Contact information:   [address], [address] Jaynie Crumble [place] [address] [phone]          Signed: Lillia Mountain 12/26/2012, 6:32 AM

## 2012-12-26 NOTE — Progress Notes (Signed)
Assessment unchanged. Pt and daughter verbalized understanding of dc instructions through teach back. Pt able to tell nurse when follow up appt. With MD takes place as well as meds to continue. No scripts. Understands My Chart with plans to set up account. Discharged via wc to front entrance to meet awaiting vehicle to carry home. Accompanied by NT and daughter.

## 2012-12-26 NOTE — Progress Notes (Signed)
IP PROGRESS NOTE  Subjective:   She was admitted on 12/19/2012 with x-ray and clinical evidence of a bowel obstruction. Ms. Paletta had experienced nausea and vomiting intermittently over the week prior to hospitalization. She reports feeling much better and was able to tolerate liquids yesterday without nausea. No other complaint. She continues to have output from the ostomy, though this slowed when the NG tube was in place.   Objective: Vital signs in last 24 hours: Blood pressure 154/84, pulse 71, temperature 98 F (36.7 C), temperature source Oral, resp. rate 16, height 5\' 3"  (1.6 m), weight 148 lb 13 oz (67.5 kg), SpO2 96.00%.  Intake/Output from previous day: 01/06 0701 - 01/07 0700 In: 1200 [P.O.:960; I.V.:240] Out: 1725 [Urine:1500; Stool:225]  Physical Exam: Lungs: Clear bilaterally  Cardiac: Regular rate and rhythm  Abdomen:  Nontender, diffuse firmness in the right abdomen without a discrete mass, liquid stool in the ostomy bag Extremities:  no leg edema    Portacath/PICC-without erythema  Lab Results:  Basename 12/25/12 0423  WBC 8.6  HGB 12.2  HCT 36.8  PLT 273    BMET  Basename 12/26/12 0545 12/25/12 0423  NA 133* 132*  K 4.1 4.2  CL 97 97  CO2 28 29  GLUCOSE 129* 152*  BUN 17 17  CREATININE 0.73 0.67  CALCIUM 8.7 8.7    Studies/Results: CT of the abdomen/pelvis on 12/18/2012-increased caliber of small bowel loops, mild progression of hypervascular subcapsular liver metastases, stable peritoneal/omental disease and pelvic masses  Medications: I have reviewed the patient's current medications.  Assessment/Plan:  1.metastatic carcinoid tumor-currently maintained on monthly Sandostatin   2. Small bowel obstruction-quickly improved after bowel rest and NG tube decompression  Ms. Boom was admitted with a bowel obstruction. Her symptoms have improved. She is now tolerating a liquid diet and this will be advanced today.  Ms. Tedrick and her  daughter understand the bowel obstruction is most likely related to abdominal carcinomatosis, though it is possible the obstruction is related to adhesions. She has been seen in consultation by Dr. Abbey Chatters and he feels surgical options are limited. Ms. Winstead understands the limited systemic chemotherapy options.  The plan is to continue monthly Sandostatin. We will check a chromogranin A level. Ms. Mcvey will return to the office on 12/30/2011 for scheduled Sandostatin and to discuss systemic treatment options.   LOS: 7 days   Sunya Humbarger  12/26/2012, 9:17 AM

## 2012-12-26 NOTE — Progress Notes (Signed)
  Subjective: Feeling better, no distension, not allot going thru yet.  Some liquid colored stool in the bag.  She just ordered solid food.    Objective: Vital signs in last 24 hours: Temp:  [98 F (36.7 C)] 98 F (36.7 C) (01/06 2215) Pulse Rate:  [71-73] 71  (01/06 2215) Resp:  [16] 16  (01/06 2215) BP: (137-154)/(83-84) 154/84 mmHg (01/06 2215) SpO2:  [96 %-97 %] 96 % (01/06 2215) Last BM Date: 12/20/12  960 ML PO recorded, 225 thru the ostomy recorded, advanced to full liquids, VSS, afebrile, BMP is normal Intake/Output from previous day: 01/06 0701 - 01/07 0700 In: 1200 [P.O.:960; I.V.:240] Out: 1725 [Urine:1500; Stool:225] Intake/Output this shift:    General appearance: alert, cooperative, no distress and hoping to go home later today. GI: soft, few BS, not distended or tender, some liquid stool in ostomy.  Lab Results:   [facility] 12/25/12 0423  WBC 8.6  HGB 12.2  HCT 36.8  PLT 273    BMET  Basename 12/26/12 0545 12/25/12 0423  NA 133* 132*  K 4.1 4.2  CL 97 97  CO2 28 29  GLUCOSE 129* 152*  BUN 17 17  CREATININE 0.73 0.67  CALCIUM 8.7 8.7   PT/INR No results found for this basename: LABPROT:2,INR:2 in the last 72 hours   Lab 12/25/12 0423 12/22/12 0440 12/21/12 0413 12/19/12 1910 12/19/12 1430  AST 24 20 35 47* 45*  ALT 20 21 29  38* 38*  ALKPHOS 152* 151* 164* 227* 222*  BILITOT 0.5 0.4 0.4 0.8 0.9  PROT 6.0 5.8* 6.0 7.9 7.7  ALBUMIN 2.4* 2.4* 2.4* 3.5 3.4*     Lipase     Component Value Date/Time   LIPASE 28 12/19/2012 1430     Studies/Results: No results found.  Medications:    . enoxaparin (LOVENOX) injection  40 mg Subcutaneous Q24H  . feeding supplement  1 Container Oral TID BM  . insulin aspart  0-15 Units Subcutaneous Q4H  . losartan  100 mg Oral Daily    Assessment/Plan SBO/CARCINOID Tumor, s/p descending colostomy and GJ 2009 , then resection of ileum with ileal transverse anastomosis 2011, to bypass malignant  obstructions Acute renal failure Carcinoid Hypokalemia PCM   Plan:  Dr. Valentina Lucks has advanced her diet, and if she does well with breakfast and lunch she hopes to go home later today.  We will be available as needed.  LOS: 7 days    JENNINGS,WILLARD 12/26/2012  She seems to be doing well.  Diet as tolerated.

## 2012-12-26 NOTE — Progress Notes (Signed)
Dr. Valentina Lucks aware via phone pt has eaten regular diet and tolerated well. Stool output noted in ileostomy. Md aware CCS okay with dc home today. See new orders entered by Dr. Valentina Lucks to dc home and have PICC line dc'd.

## 2012-12-27 ENCOUNTER — Encounter: Payer: Medicare Other | Admitting: Nutrition

## 2012-12-27 LAB — GLUCOSE, CAPILLARY: Glucose-Capillary: 154 mg/dL — ABNORMAL HIGH (ref 70–99)

## 2012-12-29 ENCOUNTER — Telehealth: Payer: Self-pay | Admitting: Oncology

## 2012-12-29 ENCOUNTER — Ambulatory Visit (HOSPITAL_BASED_OUTPATIENT_CLINIC_OR_DEPARTMENT_OTHER): Payer: Medicare Other | Admitting: Oncology

## 2012-12-29 ENCOUNTER — Ambulatory Visit: Payer: Medicare Other

## 2012-12-29 VITALS — BP 108/65 | HR 91 | Temp 97.8°F | Resp 18 | Ht 63.0 in | Wt 144.0 lb

## 2012-12-29 DIAGNOSIS — C787 Secondary malignant neoplasm of liver and intrahepatic bile duct: Secondary | ICD-10-CM

## 2012-12-29 DIAGNOSIS — K56609 Unspecified intestinal obstruction, unspecified as to partial versus complete obstruction: Secondary | ICD-10-CM

## 2012-12-29 DIAGNOSIS — R110 Nausea: Secondary | ICD-10-CM

## 2012-12-29 DIAGNOSIS — C7A012 Malignant carcinoid tumor of the ileum: Secondary | ICD-10-CM

## 2012-12-29 DIAGNOSIS — D3A098 Benign carcinoid tumors of other sites: Secondary | ICD-10-CM

## 2012-12-29 NOTE — Progress Notes (Signed)
   [facility]    OFFICE PROGRESS NOTE   INTERVAL HISTORY:   She was admitted 12/19/2012 through 12/26/2012 with a bowel obstruction. The obstruction was managed with placement of an NG tube and bowel rest. She was tolerating a diet prior to discharge. Lori Dennis reports vomiting on the evening of 12/26/2012 and again on the evening of 12/28/2012. She continues to have a diminished stool output, but she is emptying the ostomy bag. She has intermittent nausea.  Objective:  Vital signs in last 24 hours:  Blood pressure 108/65, pulse 91, temperature 97.8 F (36.6 C), temperature source Oral, resp. rate 18, height 5\' 3"  (1.6 m), weight 144 lb (65.318 kg).    HEENT: Oral cavity with moist membranes Resp: Lungs clear bilaterally Cardio: Regular rate and rhythm GI: Firmness in the right mid abdomen, no discrete mass, small amount of hand liquid stool in the ostomy bag Vascular: No leg edema  Skin: Mildly diminished skin turgor   Medications: I have reviewed the patient's current medications.  Assessment/Plan: 1.Metastatic low grade carcinoid tumor, s/p 6 cycles of xeloda/temozolamide beginnning 09/06/2010.  -The chromagranin A and 24 hour Urine 5-HIAA were not significantly changed on 07/26/2011.  -Restaging CT 06/26/2012 with overall stable disease compared to a CT from June of 2011  2. Admission with SBO/SB infarction 06/08/2010, s/p resection of small bowel and colon.  3. S/p sigmoid colectomy/colostomy, right colectomy, and gastrojejunostomy in April 2009.  4. Elevated pre-operative 24-hour urine 5-HIAA.  5. Acute N/V April 2011, likely related to a partial small bowel obstruction due to tumor. Symptoms resolved with bowel rest and a liquid diet.  6. Intermittent loose stools-? Secondary to bowel resection or carcinoid. Partially improved with Sandostatin LAR.  7. Intermittent episodes of nausea and right buttock pain, resolving spontaneously-? Related to  carcinomatosis  8. Right scalp herpes zoster rash July 2013  10. Admission with a small bowel obstruction 12/19/2012-status post NG tube decompression/bowel rest, CT the abdomen on 12/18/2012 revealed dilated small bowel loops and stomach, mild progression of capsular liver lesions, and no significant change in the pelvic masses  Disposition:  She continues to have intermittent obstructive symptoms. I suspect this is related to progression of the carcinoid tumor in the abdominal cavity as opposed to adhesions. She would like a followup upon the with Dr. Abbey Chatters for continued discussion of surgical options.  Lori Dennis will continue Zofran and Phenergan as needed for nausea. She will use a low residual diet. She will contact us for consistent nausea and vomiting.  The plan is to initiate a trial of Afinitor if she is able to maintain her nutritional status.. she understands the chance of a clinical response with systemic therapy is small.  We Decided to place Sandostatin therapy on hold since she is not clearly benefiting from this therapy. She will hold her blood pressure medications until she is able to consistently maintain her hydration. She will monitor the blood pressure at home.   Thornton Papas, MD  12/29/2012  5:56 PM

## 2012-12-29 NOTE — Telephone Encounter (Signed)
gv and printed appt schedule for pt for Jan..Marland KitchenMarland KitchenS/w gail @ Dr. Arne Cleveland office and scheduled pt for Jan 27 @ 10:45am..the patient ok and aware

## 2013-01-01 ENCOUNTER — Telehealth: Payer: Self-pay | Admitting: *Deleted

## 2013-01-01 ENCOUNTER — Ambulatory Visit: Payer: Medicare Other | Admitting: Oncology

## 2013-01-01 ENCOUNTER — Telehealth: Payer: Self-pay | Admitting: Oncology

## 2013-01-01 ENCOUNTER — Encounter (HOSPITAL_COMMUNITY): Payer: Self-pay | Admitting: Surgery

## 2013-01-01 ENCOUNTER — Other Ambulatory Visit: Payer: Self-pay | Admitting: *Deleted

## 2013-01-01 ENCOUNTER — Inpatient Hospital Stay (HOSPITAL_COMMUNITY): Admission: AD | Admit: 2013-01-01 | Payer: Medicare Other | Source: Ambulatory Visit | Admitting: Oncology

## 2013-01-01 NOTE — Telephone Encounter (Signed)
Dr. Truett Perna spoke with on call surgeon. Will have patient come to our office now to be seen for direct admission and surgical consult will be ordered. Called bed control-she will be on 3 [address]. Patient agrees to come in.

## 2013-01-01 NOTE — Telephone Encounter (Signed)
Put in appt for pt today per [facility] MD only

## 2013-01-01 NOTE — Telephone Encounter (Signed)
Continues to be uncomfortable and vomiting several times over the weekend despite taking Phenergan every 6 hours as directed by Dr. Truett Perna. Having good thick stool output in bag and gas. No fever. Asking "can we be missing something like gall bladder disease or pancreatitis?". Reports being "miserable".  Not able to see Dr. Abbey Chatters until 01/15/13.

## 2013-01-01 NOTE — Telephone Encounter (Signed)
Has questions and wishes to speak with Dr. Truett Perna. Wants to wait and be admitted tomorrow-"needs time to process all this". Also wants to see Dr. Abbey Chatters and not the physician on call in the surgical group. She also wants time to pack a bag and shower. Has not vomited today, so she will take the phenergan and sleep. Also wants to wait for NG tube to be placed when she is sedated for surgery-was too difficult a procedure.  Dr. Truett Perna notified of request.

## 2013-01-01 NOTE — Telephone Encounter (Signed)
Made patient aware that only thing that will relieve her symptoms according to Dr. Truett Perna is being in hospital with NG tube. He will call Dr. Abbey Chatters and discuss situation.

## 2013-01-02 ENCOUNTER — Telehealth: Payer: Self-pay | Admitting: *Deleted

## 2013-01-02 NOTE — Telephone Encounter (Signed)
Daughter has left VM twice this morning asking to speak with Dr. Truett Perna regarding how to get her mother seen by surgeon asap and for direct admission to hospital.  Admission had been arranged on 01/01/13, but patient declined.

## 2013-01-02 NOTE — Telephone Encounter (Signed)
Dr. Truett Perna was able to speak with Dr. Abbey Chatters today. He will not have office hours until 01/15/13, and this this the date of her appointment there. Suggests she continue full liquids and if she develops symptoms of obstruction she needs to be admitted to hospital through Dr. Truett Perna with NG tube placed and re scan and surgical consult.  Called patient and made her aware of conversation. She understands and will continue full liquids. She will go back to clear liquids if nausea worsens. Last Phenergan was Sunday and her ostomy output has improved since she went to liquids. She verbalized to nurse her displeasure with her perceived lack of prompt communication with office recently and felt her situation was not appreciated. Made her aware that Dr. Truett Perna and staff are very concerned with her wellbeing and are doing all in our power to help her from our practice ability.  Allowed her to vent her anxiety and frustration and reassured her we will make greater effort for nurse and physician to return her calls more promptly in the future.

## 2013-01-02 NOTE — Telephone Encounter (Signed)
@   1300-DrTruett Perna called patient and spoke to her at length about her current symptoms and desire to see surgeon asap. Dr. Truett Perna will attempt to speak with surgeon today. Requested she call her daughter and let her know she has spoken with Dr. Truett Perna today. She agrees to do so.

## 2013-01-11 ENCOUNTER — Ambulatory Visit (HOSPITAL_BASED_OUTPATIENT_CLINIC_OR_DEPARTMENT_OTHER): Payer: Medicare Other | Admitting: Oncology

## 2013-01-11 ENCOUNTER — Ambulatory Visit: Payer: Medicare Other | Admitting: Oncology

## 2013-01-11 ENCOUNTER — Telehealth: Payer: Self-pay | Admitting: Oncology

## 2013-01-11 VITALS — BP 121/82 | HR 91 | Temp 97.8°F | Resp 18 | Ht 63.0 in | Wt 137.4 lb

## 2013-01-11 DIAGNOSIS — R630 Anorexia: Secondary | ICD-10-CM

## 2013-01-11 DIAGNOSIS — R112 Nausea with vomiting, unspecified: Secondary | ICD-10-CM

## 2013-01-11 DIAGNOSIS — C7A00 Malignant carcinoid tumor of unspecified site: Secondary | ICD-10-CM

## 2013-01-11 DIAGNOSIS — D3A098 Benign carcinoid tumors of other sites: Secondary | ICD-10-CM

## 2013-01-11 MED ORDER — PREDNISONE 10 MG PO TABS: 10.0000 mg | ORAL_TABLET | Freq: Every day | ORAL | Status: DC

## 2013-01-11 NOTE — Telephone Encounter (Signed)
gv and printed appt schedule for pt for Feb....gv referral for [facility] to [facility].Marland KitchenMarland KitchenMarland Kitchen

## 2013-01-11 NOTE — Progress Notes (Signed)
   [facility]    OFFICE PROGRESS NOTE   INTERVAL HISTORY:   She returns as scheduled. She continues to have anorexia, nausea, and intermittent emesis. The emesis has been less frequent and she reports 6 days without emesis. She continues to have output from the ostomy.  The nausea is worse with movement. No neurologic symptoms.  Objective:  Vital signs in last 24 hours:  Blood pressure 121/82, pulse 91, temperature 97.8 F (36.6 C), temperature source Oral, resp. rate 18, height 5\' 3"  (1.6 m), weight 137 lb 6.4 oz (62.324 kg).    HEENT: The mucous membranes are moist, no thrush Resp: Lungs clear bilaterally Cardio: Regular rate and rhythm GI: Partially formed stool in the ostomy bag, firmness in the right mid abdomen without a discrete mass. Nontender. Vascular: No leg edema  Medications: I have reviewed the patient's current medications.  Assessment/Plan: 1.Metastatic low grade carcinoid tumor, s/p 6 cycles of xeloda/temozolamide beginnning 09/06/2010.  -The chromagranin A and 24 hour Urine 5-HIAA were not significantly changed on 07/26/2011.  -Restaging CT 06/26/2012 with overall stable disease compared to a CT from June of 2011  2. Admission with SBO/SB infarction 06/08/2010, s/p resection of small bowel and colon.  3. S/p sigmoid colectomy/colostomy, right colectomy, and gastrojejunostomy in April 2009.  4. Elevated pre-operative 24-hour urine 5-HIAA.  5. Acute N/V April 2011, likely related to a partial small bowel obstruction due to tumor. Symptoms resolved with bowel rest and a liquid diet.  6. Intermittent loose stools-? Secondary to bowel resection or carcinoid. Partially improved with Sandostatin LAR.  7. Intermittent episodes of nausea and right buttock pain, resolving spontaneously-? Related to carcinomatosis  8. Right scalp herpes zoster rash July 2013  10. Admission with a small bowel obstruction 12/19/2012-status post NG tube decompression/bowel  rest, CT the abdomen on 12/18/2012 revealed dilated small bowel loops and stomach, mild progression of capsular liver lesions, and no significant change in the pelvic masses   Disposition:  She continues to have nausea and intermittent emesis. Her nutritional status is poor. I discussed the current situation at length with Ms. Lerette and her daughter. I suspect her symptoms are related to progression of the carcinoid tumor within the abdomen. She understands systemic chemotherapy options are limited. We will consider a trial of Afinitor if her nutritional status improves.  She will begin a trial of low-dose prednisone to see if this helps the chronic nausea and anorexia. She is scheduled to see Dr. Abbey Chatters to discuss surgical options. In the absence of a complete obstruction I doubt she will benefit from surgical exploration.  Ms. Kuras request a second opinion. We will refer her to Dr. Lattie Corns at [facility]. I discussed the case with Dr. Lattie Corns.  Another option to consider his placement of a Port-A-Cath for TNA. She may be a candidate for a palliative gastrostomy tube for recurrent emesis. She does not have visceral organ involvement and the pace of disease progression appears to be slow.  Ms. Davidovich will contact us early next week to let us know if the prednisone was helping. If not the plan is to begin a trial of Marinol. She will return for an office visit on 01/19/2013.   Thornton Papas, MD  01/11/2013  5:50 PM

## 2013-01-12 ENCOUNTER — Telehealth: Payer: Self-pay | Admitting: *Deleted

## 2013-01-12 ENCOUNTER — Telehealth: Payer: Self-pay | Admitting: Oncology

## 2013-01-12 NOTE — Telephone Encounter (Signed)
Called pt, Dr. Truett Perna discussed case with Dr. Lattie Corns. Pt may be a candidate for TNA via port a cath. She stated she feels stronger today. Started Prednisone. Scheduled to see Dr. Lattie Corns 01/31/13. She will call office if unable to tolerate fluids.

## 2013-01-12 NOTE — Telephone Encounter (Signed)
Pt appt. With Dr. Lattie Corns @ [facility] 01/31/13@9 :00. Faxed medical records. Pt will hand carry scans to appt. Pt is aware

## 2013-01-15 ENCOUNTER — Ambulatory Visit (INDEPENDENT_AMBULATORY_CARE_PROVIDER_SITE_OTHER): Payer: Medicare Other | Admitting: General Surgery

## 2013-01-15 ENCOUNTER — Encounter (INDEPENDENT_AMBULATORY_CARE_PROVIDER_SITE_OTHER): Payer: Self-pay | Admitting: General Surgery

## 2013-01-15 VITALS — BP 124/80 | HR 64 | Temp 98.2°F | Resp 16 | Ht 63.0 in | Wt 138.6 lb

## 2013-01-15 DIAGNOSIS — K56609 Unspecified intestinal obstruction, unspecified as to partial versus complete obstruction: Secondary | ICD-10-CM

## 2013-01-15 NOTE — Progress Notes (Signed)
Subjective:     Patient ID: Lori Dennis, female   DOB: Feb 11, 1946, 67 y.o.   MRN: [id number]  HPI  She is here for followup of her progressive partial small bowel structure due to progressive carcinoid tumor. She has carcinomatosis. She was started on prednisone 4 days ago and her appetite has improved. She's been able to go anywhere from 4-6 days without having any nausea and vomiting. The colostomy has been working well.   Review of SystemsHer energy level is increased.     Objective:   Physical Exam Abdomen-midline scar with palpable nodularities; left upper quadrant colostomy working well.    Assessment:     Progressive carcinomatosis and carcinoid tumor of the into partial small bowel obstruction. She is having mostly good days now with an occasional bad day every 4-6 days. I long discussion with her and her daughters regarding surgery. I think the risks of the surgery could indeed outweigh the benefits. We discussed it would be an absolute last resort and nothing may be able to be done. 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    Plan:     She is going to see Dr. Lattie Corns at [facility] for another opinion.  I will give him copies of the operative reports so that he can review them during her visit.

## 2013-01-15 NOTE — Patient Instructions (Signed)
Please take a copy of the operative reports to Dr. Lattie Corns.

## 2013-01-19 ENCOUNTER — Other Ambulatory Visit: Payer: Self-pay | Admitting: Internal Medicine

## 2013-01-19 ENCOUNTER — Telehealth: Payer: Self-pay | Admitting: Oncology

## 2013-01-19 ENCOUNTER — Ambulatory Visit (HOSPITAL_BASED_OUTPATIENT_CLINIC_OR_DEPARTMENT_OTHER): Payer: Medicare Other | Admitting: Oncology

## 2013-01-19 VITALS — BP 122/88 | HR 86 | Temp 97.8°F | Resp 18 | Ht 63.0 in | Wt 136.1 lb

## 2013-01-19 DIAGNOSIS — R1110 Vomiting, unspecified: Secondary | ICD-10-CM

## 2013-01-19 DIAGNOSIS — C7A00 Malignant carcinoid tumor of unspecified site: Secondary | ICD-10-CM

## 2013-01-19 DIAGNOSIS — D3A098 Benign carcinoid tumors of other sites: Secondary | ICD-10-CM

## 2013-01-19 DIAGNOSIS — N179 Acute kidney failure, unspecified: Secondary | ICD-10-CM

## 2013-01-19 NOTE — Telephone Encounter (Signed)
gv and printed appt schedule for Feb

## 2013-01-19 NOTE — Progress Notes (Signed)
    [facility]    OFF[facility] PROGRESS NOTE   INTERVAL HISTORY:   She returns as scheduled. She noted improvement in the nausea, her appetite, and energy level after starting prednisone. She did not vomit for 6-7 days. She reports 3 episodes of sudden emesis over the past few days. No consistent nausea. She continues to have output in the ostomy bag. No pain.  Objective:  Vital signs in last 24 hours:  Blood pressure 122/88, pulse 86, temperature 97.8 F (36.6 C), temperature source Oral, resp. rate 18, height 5\' 3"  (1.6 m), weight 136 lb 1.6 oz (61.735 kg).    HEENT: The mucous membranes are moist, no thrush Resp: Lungs clear bilaterally Cardio: Regular rate and rhythm GI: ; Stool in the left lower quadrant ostomy bag, nontender, no discrete mass, no apparent ascites Vascular: No leg edema   Lab Results:  She reports having a chemistry panel at Dr. Ardine Eng office earlier this week   Medications: I have reviewed the patient's current medications.  Assessment/Plan: 1.Metastatic low grade carcinoid tumor, s/p 6 cycles of xeloda/temozolamide beginnning 09/06/2010.  -The chromagranin A and 24 hour Urine 5-HIAA were not significantly changed on 07/26/2011.  -Restaging CT 06/26/2012 with overall stable disease compared to a CT from June of 2011  2. Admission with SBO/SB infarction 06/08/2010, s/p resection of small bowel and colon.  3. S/p sigmoid colectomy/colostomy, right colectomy, and gastrojejunostomy in April 2009.  4. Elevated pre-operative 24-hour urine 5-HIAA.  5. Acute N/V April 2011, likely related to a partial small bowel obstruction due to tumor. Symptoms resolved with bowel rest and a liquid diet.  6. Intermittent loose stools-? Secondary to bowel resection or carcinoid. Partially improved with Sandostatin LAR.  7. Intermittent episodes of nausea and right buttock pain, resolving spontaneously-? Related to carcinomatosis  8. Right scalp herpes zoster rash  July 2013  10. Admission with a small bowel obstruction 12/19/2012-status post NG tube decompression/bowel rest, CT the abdomen on 12/18/2012 revealed dilated small bowel loops and stomach, mild progression of capsular liver lesions, and no significant change in the pelvic masses . She continues to have intermittent obstructive symptoms. The nausea has improved since starting prednisone 01/11/2013.  Disposition:  Her overall performance status has improved since starting low-dose prednisone. However she continues to have intermittent episodes of vomiting.  We discussed beginning a trial of Afinitor. She would like to wait until after seeing Dr.Morse. We also discussed placement of a Port-A-Cath and cyclic TNA. She would like to see how her intake is over the next few weeks prior to committing to TNA.  Ms. Mcphearson is scheduled to see Dr. Lattie Corns on 01/31/2013. She will return for an office visit on 02/02/2013.   Thornton Papas, MD  01/19/2013  10:02 AM

## 2013-01-23 ENCOUNTER — Other Ambulatory Visit: Payer: Medicare Other

## 2013-01-24 ENCOUNTER — Ambulatory Visit
Admission: RE | Admit: 2013-01-24 | Discharge: 2013-01-24 | Disposition: A | Discharge status: Home / Self Care | Payer: Medicare Other | Source: Ambulatory Visit | Attending: Internal Medicine | Admitting: Internal Medicine

## 2013-01-24 DIAGNOSIS — N179 Acute kidney failure, unspecified: Secondary | ICD-10-CM

## 2013-01-26 ENCOUNTER — Telehealth: Payer: Self-pay | Admitting: *Deleted

## 2013-01-26 ENCOUNTER — Ambulatory Visit (HOSPITAL_BASED_OUTPATIENT_CLINIC_OR_DEPARTMENT_OTHER): Payer: Medicare Other | Admitting: Oncology

## 2013-01-26 ENCOUNTER — Other Ambulatory Visit (INDEPENDENT_AMBULATORY_CARE_PROVIDER_SITE_OTHER): Payer: Self-pay | Admitting: General Surgery

## 2013-01-26 ENCOUNTER — Ambulatory Visit (HOSPITAL_BASED_OUTPATIENT_CLINIC_OR_DEPARTMENT_OTHER): Payer: Medicare Other | Admitting: Lab

## 2013-01-26 ENCOUNTER — Other Ambulatory Visit: Payer: Self-pay | Admitting: *Deleted

## 2013-01-26 ENCOUNTER — Ambulatory Visit (HOSPITAL_BASED_OUTPATIENT_CLINIC_OR_DEPARTMENT_OTHER): Payer: Medicare Other

## 2013-01-26 VITALS — BP 106/61 | HR 80 | Temp 98.1°F

## 2013-01-26 DIAGNOSIS — D490 Neoplasm of unspecified behavior of digestive system: Secondary | ICD-10-CM

## 2013-01-26 DIAGNOSIS — E860 Dehydration: Secondary | ICD-10-CM

## 2013-01-26 DIAGNOSIS — D3A00 Benign carcinoid tumor of unspecified site: Secondary | ICD-10-CM

## 2013-01-26 DIAGNOSIS — R112 Nausea with vomiting, unspecified: Secondary | ICD-10-CM

## 2013-01-26 DIAGNOSIS — Z01818 Encounter for other preprocedural examination: Secondary | ICD-10-CM

## 2013-01-26 LAB — BASIC METABOLIC PANEL (CC13)
Calcium: 9 mg/dL (ref 8.4–10.4)
Chloride: 92 mEq/L — ABNORMAL LOW (ref 98–107)
Creatinine: 2.2 mg/dL — ABNORMAL HIGH (ref 0.6–1.1)
Sodium: 138 mEq/L (ref 136–145)

## 2013-01-26 MED ORDER — PROMETHAZINE HCL 25 MG/ML IJ SOLN
12.5000 mg | Freq: Once | INTRAMUSCULAR | Status: AC
  Administered 2013-01-26: 12.5 mg via INTRAVENOUS
  Filled 2013-01-26: qty 1

## 2013-01-26 MED ORDER — SODIUM CHLORIDE 0.9 % IV SOLN
Freq: Once | INTRAVENOUS | Status: AC
  Administered 2013-01-26: 15:00:00 via INTRAVENOUS

## 2013-01-26 MED ORDER — SODIUM CHLORIDE 0.9 % IV SOLN
Freq: Once | INTRAVENOUS | Status: DC
  Administered 2013-01-26: 13:00:00 via INTRAVENOUS

## 2013-01-26 MED ORDER — SODIUM CHLORIDE 0.9 % IV SOLN: INTRAVENOUS | Status: DC

## 2013-01-26 MED ORDER — LORAZEPAM 0.5 MG PO TABS
0.5000 mg | ORAL_TABLET | Freq: Four times a day (QID) | ORAL | Status: DC | PRN
Start: 2013-01-26 — End: 2013-07-17

## 2013-01-26 NOTE — Patient Instructions (Addendum)
Dehydration, Adult Dehydration means your body does not have as much fluid as it needs. Your kidneys, brain, and heart will not work properly without the right amount of fluids and salt.  HOME CARE  Ask your doctor how to replace body fluid losses (rehydrate).  Drink enough fluids to keep your pee (urine) clear or pale yellow.  Drink small amounts of fluids often if you feel sick to your stomach (nauseous) or throw up (vomit).  Eat like you normally do.  Avoid:  Foods or drinks high in sugar.  Bubbly (carbonated) drinks.  Juice.  Very hot or cold fluids.  Drinks with caffeine.  Fatty, greasy foods.  Alcohol.  Tobacco.  Eating too much.  Gelatin desserts.  Wash your hands to avoid spreading germs (bacteria, viruses).  Only take medicine as told by your doctor.  Keep all doctor visits as told. GET HELP RIGHT AWAY IF:   You cannot drink something without throwing up.  You get worse even with treatment.  Your vomit has blood in it or looks greenish.  Your poop (stool) has blood in it or looks black and tarry.  You have not peed in 6 to 8 hours.  You pee a small amount of very dark pee.  You have a fever.  You pass out (faint).  You have belly (abdominal) pain that gets worse or stays in one spot (localizes).  You have a rash, stiff neck, or bad headache.  You get easily annoyed, sleepy, or are hard to wake up.  You feel weak, dizzy, or very thirsty. MAKE SURE YOU:   Understand these instructions.  Will watch your condition.  Will get help right away if you are not doing well or get worse. Document Released: 10/02/2009 Document Revised: 02/28/2012 Document Reviewed: 07/26/2011 [facility] Patient Information 2013 [place], [place].  Implanted Port Instructions An implanted port is a central line that has a round shape and is placed under the skin. It is used for long-term IV (intravenous) access for:  Medicine.  Fluids.  Liquid nutrition, such as  TPN (total parenteral nutrition).  Blood samples. Ports can be placed:  In the chest area just below the collarbone (this is the most common place.)  In the arms.  In the belly (abdomen) area.  In the legs. PARTS OF THE PORT A port has 2 main parts:  The reservoir. The reservoir is round, disc-shaped, and will be a small, raised area under your skin.  The reservoir is the part where a needle is inserted (accessed) to either give medicines or to draw blood.  The catheter. The catheter is a long, slender tube that extends from the reservoir. The catheter is placed into a large vein.  Medicine that is inserted into the reservoir goes into the catheter and then into the vein. INSERTION OF THE PORT  The port is surgically placed in either an operating room or in a procedural area (interventional radiology).  Medicine may be given to help you relax during the procedure.  The skin where the port will be inserted is numbed (local anesthetic).  1 or 2 small cuts (incisions) will be made in the skin to insert the port.  The port can be used after it has been inserted. INCISION SITE CARE  The incision site may have small adhesive strips on it. This helps keep the incision site closed. Sometimes, no adhesive strips are placed. Instead of adhesive strips, a special kind of surgical glue is used to keep the incision closed.  If adhesive strips were placed on the incision sites, do not take them off. They will fall off on their own.  The incision site may be sore for 1 to 2 days. Pain medicine can help.  Do not get the incision site wet. Bathe or shower as directed by your caregiver.  The incision site should heal in 5 to 7 days. A small scar may form after the incision has healed. ACCESSING THE PORT Special steps must be taken to access the port:  Before the port is accessed, a numbing cream can be placed on the skin. This helps numb the skin over the port site.  A sterile  technique is used to access the port.  The port is accessed with a needle. Only "non-coring" port needles should be used to access the port. Once the port is accessed, a blood return should be checked. This helps ensure the port is in the vein and is not clogged (clotted).  If your caregiver believes your port should remain accessed, a clear (transparent) bandage will be placed over the needle site. The bandage and needle will need to be changed every week or as directed by your caregiver.  Keep the bandage covering the needle clean and dry. Do not get it wet. Follow your caregiver's instructions on how to take a shower or bath when the port is accessed.  If your port does not need to stay accessed, no bandage is needed over the port. FLUSHING THE PORT Flushing the port keeps it from getting clogged. How often the port is flushed depends on:  If a constant infusion is running. If a constant infusion is running, the port may not need to be flushed.  If intermittent medicines are given.  If the port is not being used. For intermittent medicines:  The port will need to be flushed:  After medicines have been given.  After blood has been drawn.  As part of routine maintenance.  A port is normally flushed with:  Normal saline.  Heparin.  Follow your caregiver's advice on how often, how much, and the type of flush to use on your port. IMPORTANT PORT INFORMATION  Tell your caregiver if you are allergic to heparin.  After your port is placed, you will get a manufacturer's information card. The card has information about your port. Keep this card with you at all times.  There are many types of ports available. Know what kind of port you have.  In case of an emergency, it may be helpful to wear a medical alert bracelet. This can help alert health care workers that you have a port.  The port can stay in for as long as your caregiver believes it is necessary.  When it is time for the  port to come out, surgery will be done to remove it. The surgery will be similar to how the port was put in.  If you are in the hospital or clinic:  Your port will be taken care of and flushed by a nurse.  If you are at home:  A home health care nurse may give medicines and take care of the port.  You or a family member can get special training and directions for giving medicine and taking care of the port at home. SEEK IMMEDIATE MEDICAL CARE IF:   Your port does not flush or you are unable to get a blood return.  New drainage or pus is coming from the incision.  A bad smell is  coming from the incision site.  You develop swelling or increased redness at the incision site.  You develop increased swelling or pain at the port site.  You develop swelling or pain in the surrounding skin near the port.  You have an oral temperature above 102 F (38.9 C), not controlled by medicine. MAKE SURE YOU:   Understand these instructions.  Will watch your condition.  Will get help right away if you are not doing well or get worse. Document Released: 12/06/2005 Document Revised: 02/28/2012 Document Reviewed: 02/27/2009 [facility] Patient Information 2013 [place], [place].

## 2013-01-26 NOTE — Telephone Encounter (Signed)
Call from pt reporting she has been vomiting since Monday night. Continues Prednisone, taking Phenergan for nausea. Asking if there is another rx MD could call in for her N/V? Reviewed with Dr. Truett Perna, bring pt in for IVF. Will need to consider port a cath for TPN or a feeding tube. She will discuss this with family.

## 2013-01-26 NOTE — Progress Notes (Signed)
   [facility]    OFFICE PROGRESS NOTE   INTERVAL HISTORY:   She returns for an unscheduled visit. She reports nausea/vomiting on multiple occasions over the past few days. The nausea has not been relieved with Phenergan or prednisone. There is output in the ostomy bag. She had cramping lower abdominal pain earlier today. Dr. Valentina Lucks refered her for a renal ultrasound on 01/24/2013 and there was no hydronephrosis.  Objective:  Vital signs in last 24 hours:  There were no vitals taken for this visit.    HEENT: The mucous membranes are dry, no thrush Resp: Bronchial sounds at the right upper posterior chest, air movement bilaterally, no respiratory distress Cardio: Regular rate and rhythm GI: Brown liquid stool in the left abdomen ostomy bag. The abdomen is nontender. No discrete mass. The abdomen is soft. Vascular: No leg edema   Lab Results:  Lab Results  Component Value Date   WBC 8.6 12/25/2012   HGB 12.2 12/25/2012   HCT 36.8 12/25/2012   MCV 89.8 12/25/2012   PLT 273 12/25/2012   Potassium 3.5, BUN 47.9, creatinine 2.2, calcium 9.0, sodium 138   Medications: I have reviewed the patient's current medications.  Assessment/Plan: 1.Metastatic low grade carcinoid tumor, s/p 6 cycles of xeloda/temozolamide beginnning 09/06/2010.  -The chromagranin A and 24 hour Urine 5-HIAA were not significantly changed on 07/26/2011.  -Restaging CT 06/26/2012 with overall stable disease compared to a CT from June of 2011  2. Admission with SBO/SB infarction 06/08/2010, s/p resection of small bowel and colon.  3. S/p sigmoid colectomy/colostomy, right colectomy, and gastrojejunostomy in April 2009.  4. Elevated pre-operative 24-hour urine 5-HIAA.  5. Acute N/V April 2011, likely related to a partial small bowel obstruction due to tumor. Symptoms resolved with bowel rest and a liquid diet.  6. Intermittent loose stools-? Secondary to bowel resection or carcinoid. Partially improved  with Sandostatin LAR.  7. Intermittent episodes of nausea and right buttock pain, resolving spontaneously-? Related to carcinomatosis  8. Right scalp herpes zoster rash July 2013  10. Admission with a small bowel obstruction 12/19/2012-status post NG tube decompression/bowel rest, CT the abdomen on 12/18/2012 revealed dilated small bowel loops and stomach, mild progression of capsular liver lesions, and no significant change in the pelvic masses . She continues to have intermittent obstructive symptoms. The nausea initially improved with low-dose prednisone, but she has developed progressive nausea/vomiting over the past several days. She appears dehydrated today. 11. Elevated BUN and creatinine-likely secondary to dehydration, a renal ultrasound on 01/24/2013 negative for hydronephrosis   Disposition:  She continues to have intermittent episodes of nausea/vomiting and dehydration. I suspect the nausea is related to partial bowel obstruction and a direct effect of the carcinomatosis.  I discussed treatment options with Ms. Wooden and her daughter. She will receive intravenous fluids today and again on 01/27/2013. She will return for a chemistry panel on 01/29/2013. I discussed the case with Dr. Abbey Chatters and he plans to place a Port-A-Cath for the administration of intravenous fluids/TNA at home. She received Port-A-Cath teaching from the chemotherapy nurses today.  Ms. Mcmannis will see Dr. Lattie Corns on 01/31/2013. She will return for an office visit here on 02/02/2013. We will consider starting a trial of afinitor when the Port-A-Cath is in place. Sandostatin remains on hold.   Thornton Papas, MD  01/26/2013  4:51 PM

## 2013-01-27 ENCOUNTER — Ambulatory Visit (HOSPITAL_BASED_OUTPATIENT_CLINIC_OR_DEPARTMENT_OTHER): Payer: Medicare Other

## 2013-01-27 VITALS — BP 116/77 | HR 77 | Temp 97.9°F

## 2013-01-27 DIAGNOSIS — E860 Dehydration: Secondary | ICD-10-CM

## 2013-01-27 DIAGNOSIS — R112 Nausea with vomiting, unspecified: Secondary | ICD-10-CM

## 2013-01-27 DIAGNOSIS — D490 Neoplasm of unspecified behavior of digestive system: Secondary | ICD-10-CM

## 2013-01-27 DIAGNOSIS — C7A00 Malignant carcinoid tumor of unspecified site: Secondary | ICD-10-CM

## 2013-01-27 MED ORDER — SODIUM CHLORIDE 0.9 % IV SOLN
INTRAVENOUS | Status: AC
  Administered 2013-01-27: 08:00:00 via INTRAVENOUS

## 2013-01-27 MED ORDER — PROMETHAZINE HCL 25 MG/ML IJ SOLN
12.5000 mg | Freq: Once | INTRAMUSCULAR | Status: AC
  Administered 2013-01-27: 25 mg via INTRAVENOUS

## 2013-01-27 NOTE — Patient Instructions (Addendum)
Dehydration, Adult Dehydration is when you lose more fluids from the body than you take in. Vital organs like the kidneys, brain, and heart cannot function without a proper amount of fluids and salt. Any loss of fluids from the body can cause dehydration.  CAUSES   Vomiting.  Diarrhea.  Excessive sweating.  Excessive urine output.  Fever. SYMPTOMS  Mild dehydration  Thirst.  Dry lips.  Slightly dry mouth. Moderate dehydration  Very dry mouth.  Sunken eyes.  Skin does not bounce back quickly when lightly pinched and released.  Dark urine and decreased urine production.  Decreased tear production.  Headache. Severe dehydration  Very dry mouth.  Extreme thirst.  Rapid, weak pulse (more than 100 beats per minute at rest).  Cold hands and feet.  Not able to sweat in spite of heat and temperature.  Rapid breathing.  Blue lips.  Confusion and lethargy.  Difficulty being awakened.  Minimal urine production.  No tears. DIAGNOSIS  Your caregiver will diagnose dehydration based on your symptoms and your exam. Blood and urine tests will help confirm the diagnosis. The diagnostic evaluation should also identify the cause of dehydration. TREATMENT  Treatment of mild or moderate dehydration can often be done at home by increasing the amount of fluids that you drink. It is best to drink small amounts of fluid more often. Drinking too much at one time can make vomiting worse. Refer to the home care instructions below. Severe dehydration needs to be treated at the hospital where you will probably be given intravenous (IV) fluids that contain water and electrolytes. HOME CARE INSTRUCTIONS   Ask your caregiver about specific rehydration instructions.  Drink enough fluids to keep your urine clear or pale yellow.  Drink small amounts frequently if you have nausea and vomiting.  Eat as you normally do.  Avoid:  Foods or drinks high in sugar.  Carbonated  drinks.  Juice.  Extremely hot or cold fluids.  Drinks with caffeine.  Fatty, greasy foods.  Alcohol.  Tobacco.  Overeating.  Gelatin desserts.  Wash your hands well to avoid spreading bacteria and viruses.  Only take over-the-counter or prescription medicines for pain, discomfort, or fever as directed by your caregiver.  Ask your caregiver if you should continue all prescribed and over-the-counter medicines.  Keep all follow-up appointments with your caregiver. SEEK MEDICAL CARE IF:  You have abdominal pain and it increases or stays in one area (localizes).  You have a rash, stiff neck, or severe headache.  You are irritable, sleepy, or difficult to awaken.  You are weak, dizzy, or extremely thirsty. SEEK IMMEDIATE MEDICAL CARE IF:   You are unable to keep fluids down or you get worse despite treatment.  You have frequent episodes of vomiting or diarrhea.  You have blood or green matter (bile) in your vomit.  You have blood in your stool or your stool looks black and tarry.  You have not urinated in 6 to 8 hours, or you have only urinated a small amount of very dark urine.  You have a fever.  You faint. MAKE SURE YOU:   Understand these instructions.  Will watch your condition.  Will get help right away if you are not doing well or get worse. Document Released: 12/06/2005 Document Revised: 02/28/2012 Document Reviewed: 07/26/2011 [employer] Patient Information 2013 [employer].  

## 2013-01-29 ENCOUNTER — Telehealth: Payer: Self-pay | Admitting: *Deleted

## 2013-01-29 ENCOUNTER — Other Ambulatory Visit (HOSPITAL_BASED_OUTPATIENT_CLINIC_OR_DEPARTMENT_OTHER): Payer: Medicare Other

## 2013-01-29 DIAGNOSIS — C7A00 Malignant carcinoid tumor of unspecified site: Secondary | ICD-10-CM

## 2013-01-29 DIAGNOSIS — R112 Nausea with vomiting, unspecified: Secondary | ICD-10-CM

## 2013-01-29 DIAGNOSIS — Z5111 Encounter for antineoplastic chemotherapy: Secondary | ICD-10-CM

## 2013-01-29 DIAGNOSIS — D490 Neoplasm of unspecified behavior of digestive system: Secondary | ICD-10-CM

## 2013-01-29 LAB — CBC WITH DIFFERENTIAL/PLATELET
BASO%: 0.6 % (ref 0.0–2.0)
Basophils Absolute: 0 10*3/uL (ref 0.0–0.1)
EOS%: 1.4 % (ref 0.0–7.0)
HCT: 40.9 % (ref 34.8–46.6)
HGB: 13.5 g/dL (ref 11.6–15.9)
LYMPH%: 19.6 % (ref 14.0–49.7)
MCH: 30.1 pg (ref 25.1–34.0)
MCHC: 32.9 g/dL (ref 31.5–36.0)
MCV: 91.2 fL (ref 79.5–101.0)
MONO%: 4.8 % (ref 0.0–14.0)
NEUT%: 73.6 % (ref 38.4–76.8)

## 2013-01-29 NOTE — Telephone Encounter (Signed)
MD aware of BUN/Creat and CBC results. Improved. Follow up as scheduled if tolerating liquids. Lori Dennis reports she has had a good two days-no emesis and taking fluids. Did vomit large amount on Saturday night. Goes to [facility] on 01/31/13. Pre admission testing on Friday for port placement on 02/06/13.

## 2013-01-30 ENCOUNTER — Encounter (HOSPITAL_COMMUNITY): Payer: Self-pay | Admitting: Pharmacy Technician

## 2013-02-01 ENCOUNTER — Telehealth: Payer: Self-pay | Admitting: *Deleted

## 2013-02-01 ENCOUNTER — Other Ambulatory Visit (HOSPITAL_COMMUNITY): Payer: Self-pay | Admitting: *Deleted

## 2013-02-01 ENCOUNTER — Telehealth: Payer: Self-pay | Admitting: Oncology

## 2013-02-01 NOTE — Telephone Encounter (Signed)
error 

## 2013-02-01 NOTE — Telephone Encounter (Signed)
pt called to r/s appt....Done °

## 2013-02-02 ENCOUNTER — Ambulatory Visit: Payer: Medicare Other | Admitting: Oncology

## 2013-02-02 ENCOUNTER — Ambulatory Visit (HOSPITAL_COMMUNITY)
Admission: RE | Admit: 2013-02-02 | Discharge: 2013-02-02 | Disposition: A | Discharge status: Home / Self Care | Payer: Medicare Other | Source: Ambulatory Visit | Attending: General Surgery | Admitting: General Surgery

## 2013-02-02 ENCOUNTER — Encounter (HOSPITAL_COMMUNITY): Payer: Self-pay

## 2013-02-02 ENCOUNTER — Encounter (HOSPITAL_COMMUNITY)
Admission: RE | Admit: 2013-02-02 | Discharge: 2013-02-02 | Disposition: A | Discharge status: Home / Self Care | Payer: Medicare Other | Source: Ambulatory Visit | Attending: General Surgery | Admitting: General Surgery

## 2013-02-02 DIAGNOSIS — D3A00 Benign carcinoid tumor of unspecified site: Secondary | ICD-10-CM | POA: Insufficient documentation

## 2013-02-02 DIAGNOSIS — Z01812 Encounter for preprocedural laboratory examination: Secondary | ICD-10-CM | POA: Insufficient documentation

## 2013-02-02 HISTORY — DX: Chronic kidney disease, unspecified: N18.9

## 2013-02-02 LAB — SURGICAL PCR SCREEN: MRSA, PCR: NEGATIVE

## 2013-02-02 NOTE — Pre-Procedure Instructions (Signed)
[address] Lori Dennis  02/02/2013   Your procedure is scheduled on:Wednesday, February 07, 2040  Report to [facility] at  AM.1045  Call this number if you have problems the morning of surgery: [phone]   Remember:   Do not drink liquids or  eat food:After Midnight.Tuesday night February 05, 2013      Take these medicines the morning of surgery with A SIP OF WATER:                           SEE [place] PREPARING FOR SURGERY SHEET    Do not wear jewelry, make-up or nail polish.  Do not wear lotions, powders, or perfumes. You may wear deodorant.             Men may shave face and neck.  Do not bring valuables to the hospital.  Contacts, dentures or bridgework may not be worn into surgery.  Leave suitcase in the car. After surgery it may be brought to your room.  For patients admitted to the hospital, checkout time is 11:00 AM the day of                         discharge.   Patients discharged the day of surgery will not be allowed to drive home.  Name and phone number of your driver:      Please read over the following fact sheets that you were given:MRSA Information                    Call Jolyn Nap, RN pre op nurse if needed [phone]               FAILURE TO FOLLOW THESE INSTRUCTIONS MAY RESULT IN THE CANCELLATION OF YOUR SURGERY. PATIENT SIGNATURE___________________________________________________

## 2013-02-02 NOTE — Progress Notes (Addendum)
Bun and creatiine results 01-29-2013 routed to dr Abbey Chatters by epic ekg 10-18-2012 dr Jonny Ruiz griffin on chart. Cbc, bun, creatinine 01-29-2013 epic bmet 01-26-2013 epic

## 2013-02-02 NOTE — Patient Instructions (Addendum)
[name]  02/02/2013   Your procedure is scheduled on: 02-06-2013  Report to [facility] at 1045 AM.  Call this number if you have problems the morning of surgery [phone]   Remember:   Do not eat food :After Midnight.   clear liquids midnight until 0715 am day of surgery, then nothing by mouth.   Take these medicines the morning of surgery with A SIP OF WATER: ativan if needed, phenergan if needed.                                SEE [place] PREPARING FOR SURGERY SHEET   Do not wear jewelry, make-up or nail polish.  Do not wear lotions, powders, or perfumes. You may wear deodorant.   Men may shave face and neck.  Do not bring valuables to the hospital.  Contacts, dentures or bridgework may not be worn into surgery.  Leave suitcase in the car. After surgery it may be brought to your room.  For patients admitted to the hospital, checkout time is 11:00 AM the day of discharge.   Patients discharged the day of surgery will not be allowed to drive home.  Name and phone number of your driver:  Special Instructions: N/A   Please read over the following fact sheets that you were given: MRSA Information.  Call Cain Sieve RN pre op nurse if needed [phone]    FAILURE TO FOLLOW THESE INSTRUCTIONS MAY RESULT IN THE CANCELLATION OF YOUR SURGERY. PATIENT SIGNATURE___________________________________________

## 2013-02-05 ENCOUNTER — Telehealth: Payer: Self-pay | Admitting: Oncology

## 2013-02-05 ENCOUNTER — Ambulatory Visit (HOSPITAL_BASED_OUTPATIENT_CLINIC_OR_DEPARTMENT_OTHER): Payer: Medicare Other | Admitting: Oncology

## 2013-02-05 VITALS — BP 121/81 | HR 86 | Temp 97.3°F | Resp 18 | Ht 63.0 in | Wt 133.4 lb

## 2013-02-05 DIAGNOSIS — D3A098 Benign carcinoid tumors of other sites: Secondary | ICD-10-CM

## 2013-02-05 DIAGNOSIS — C7A00 Malignant carcinoid tumor of unspecified site: Secondary | ICD-10-CM

## 2013-02-05 MED ORDER — LIDOCAINE-PRILOCAINE 2.5-2.5 % EX CREA: TOPICAL_CREAM | CUTANEOUS | Status: DC | PRN

## 2013-02-05 MED ORDER — CEFAZOLIN SODIUM-DEXTROSE 2-3 GM-% IV SOLR: 2.0000 g | INTRAVENOUS | Status: DC

## 2013-02-05 MED ORDER — PROMETHAZINE HCL 12.5 MG PO TABS: ORAL_TABLET | ORAL | Status: DC

## 2013-02-05 MED ORDER — DRONABINOL 2.5 MG PO CAPS: 2.5000 mg | ORAL_CAPSULE | Freq: Two times a day (BID) | ORAL | Status: DC

## 2013-02-05 NOTE — Telephone Encounter (Signed)
s/w pt and advised on change of d/t of appt....pt ok and aware

## 2013-02-05 NOTE — Progress Notes (Signed)
[facility]    OFFICE PROGRESS NOTE   INTERVAL HISTORY:   She missed a scheduled visit on 02/02/2013 secondary to the bad weather conditions. She saw Dr. Lattie Corns on 01/31/2013. He agrees with placement of a Port-A-Cath for nutrition/fluids. Ms. Laplante and her family discussed treatment options for the metastatic carcinoid tumor with Dr. Lattie Corns.  She continues to have intermittent emesis. She last vomited on 02/04/2013. She has discomfort in the right lower abdomen when ambulating.  She is scheduled for placement of a Port-A-Cath on 02/06/2013. Phenergan and lorazepam partially relieved the nausea .  Objective:  Vital signs in last 24 hours:  Blood pressure 121/81, pulse 86, temperature 97.3 F (36.3 C), temperature source Oral, resp. rate 18, height 5\' 3"  (1.6 m), weight 133 lb 6.4 oz (60.51 kg).    HEENT: The mucous membranes are dry, no thrush Resp: Lungs clear bilaterally Cardio: Regular rate and rhythm GI: No hepatomegaly, left mid abdomen colostomy, firm fullness in the right lower abdomen Vascular: No leg edema, diminished skin turgor   Lab Results:  Lab Results  Component Value Date   WBC 8.4 01/29/2013   HGB 13.5 01/29/2013   HCT 40.9 01/29/2013   MCV 91.2 01/29/2013   PLT 194 01/29/2013   BUN 27.9, creatinine 1.3    Medications: I have reviewed the patient's current medications.  Assessment/Plan: 1.Metastatic low grade carcinoid tumor, s/p 6 cycles of xeloda/temozolamide beginnning 09/06/2010.  -The chromagranin A and 24 hour Urine 5-HIAA were not significantly changed on 07/26/2011.  -Restaging CT 06/26/2012 with overall stable disease compared to a CT from June of 2011  2. Admission with SBO/SB infarction 06/08/2010, s/p resection of small bowel and colon.  3. S/p sigmoid colectomy/colostomy, right colectomy, and gastrojejunostomy in April 2009.  4. Elevated pre-operative 24-hour urine 5-HIAA.  5. Acute N/V April 2011, likely related to a  partial small bowel obstruction due to tumor. Symptoms resolved with bowel rest and a liquid diet.  6. Intermittent loose stools-? Secondary to bowel resection or carcinoid. Partially improved with Sandostatin LAR.  7. Intermittent episodes of nausea and right buttock pain, resolving spontaneously-? Related to carcinomatosis  8. Right scalp herpes zoster rash July 2013  10. Admission with a small bowel obstruction 12/19/2012-status post NG tube decompression/bowel rest, CT the abdomen on 12/18/2012 revealed dilated small bowel loops and stomach, mild progression of capsular liver lesions, and no significant change in the pelvic masses . She continues to have intermittent obstructive symptoms. The nausea initially improved with low-dose prednisone, but she has developed progressive nausea with intermittent emesis. 11. Elevated BUN and creatinine 01/26/2013-likely secondary to dehydration, a renal ultrasound on 01/24/2013 negative for hydronephrosis, the BUN/creatinine were improved after receiving intravenous fluids.    Disposition:  Ms. Pickney appears unchanged. She is scheduled for placement of a Port-A-Cath 02/06/2013. We will ask Dr. Abbey Chatters to leave the Port-A-Cath accessed so that home TNA can start within the next few days. We will make [facility] to [facility] for the home [facility]NA and pharmacy monitoring of electrolytes.  Ms. Weinheimer would like to avoid a palliative gastrostomy tube if possible. She will try Marinol for the nausea.  The hope is for improvement in her performance status over the next several weeks. We will then consider systemic treatment options for the carcinoid tumor. Dr. Lattie Corns and I discussed various systemic options including 5-FU/streptozocin and CAPOX.  Ms. Knoche will return for an office visit in 2 weeks.   Thornton Papas, MD  02/05/2013  5:26 PM

## 2013-02-05 NOTE — Telephone Encounter (Signed)
Gave pt apt for March 2014

## 2013-02-06 ENCOUNTER — Ambulatory Visit (HOSPITAL_COMMUNITY): Payer: Medicare Other

## 2013-02-06 ENCOUNTER — Encounter (HOSPITAL_COMMUNITY): Payer: Self-pay | Admitting: Anesthesiology

## 2013-02-06 ENCOUNTER — Encounter (HOSPITAL_COMMUNITY): Payer: Self-pay | Admitting: *Deleted

## 2013-02-06 ENCOUNTER — Encounter (HOSPITAL_COMMUNITY)
Admission: RE | Disposition: A | Discharge status: Home / Self Care | Payer: Self-pay | Source: Ambulatory Visit | Attending: General Surgery

## 2013-02-06 ENCOUNTER — Ambulatory Visit (HOSPITAL_COMMUNITY): Payer: Medicare Other | Admitting: Anesthesiology

## 2013-02-06 ENCOUNTER — Telehealth: Payer: Self-pay | Admitting: *Deleted

## 2013-02-06 ENCOUNTER — Ambulatory Visit (HOSPITAL_COMMUNITY)
Admission: RE | Admit: 2013-02-06 | Discharge: 2013-02-06 | Disposition: A | Discharge status: Home / Self Care | Payer: Medicare Other | Source: Ambulatory Visit | Attending: General Surgery | Admitting: General Surgery

## 2013-02-06 DIAGNOSIS — Z90710 Acquired absence of both cervix and uterus: Secondary | ICD-10-CM | POA: Insufficient documentation

## 2013-02-06 DIAGNOSIS — R112 Nausea with vomiting, unspecified: Secondary | ICD-10-CM | POA: Insufficient documentation

## 2013-02-06 DIAGNOSIS — I10 Essential (primary) hypertension: Secondary | ICD-10-CM | POA: Insufficient documentation

## 2013-02-06 DIAGNOSIS — Z79899 Other long term (current) drug therapy: Secondary | ICD-10-CM | POA: Insufficient documentation

## 2013-02-06 DIAGNOSIS — Z933 Colostomy status: Secondary | ICD-10-CM | POA: Insufficient documentation

## 2013-02-06 DIAGNOSIS — C7A00 Malignant carcinoid tumor of unspecified site: Secondary | ICD-10-CM | POA: Insufficient documentation

## 2013-02-06 DIAGNOSIS — C7B8 Other secondary neuroendocrine tumors: Secondary | ICD-10-CM | POA: Insufficient documentation

## 2013-02-06 DIAGNOSIS — D3A00 Benign carcinoid tumor of unspecified site: Secondary | ICD-10-CM

## 2013-02-06 HISTORY — PX: PORTACATH PLACEMENT: SHX2246

## 2013-02-06 SURGERY — INSERTION, TUNNELED CENTRAL VENOUS DEVICE, WITH PORT
Anesthesia: Monitor Anesthesia Care | Site: Chest | Laterality: Right | Wound class: Clean

## 2013-02-06 MED ORDER — ACETAMINOPHEN 650 MG RE SUPP
650.0000 mg | RECTAL | Status: DC | PRN
  Filled 2013-02-06: qty 1

## 2013-02-06 MED ORDER — LIDOCAINE-EPINEPHRINE 1 %-1:100000 IJ SOLN
INTRAMUSCULAR | Status: DC | PRN
  Administered 2013-02-06: 30 mL

## 2013-02-06 MED ORDER — SODIUM CHLORIDE 0.9 % IR SOLN
Status: DC | PRN
  Administered 2013-02-06: 15:00:00

## 2013-02-06 MED ORDER — HEPARIN SOD (PORK) LOCK FLUSH 100 UNIT/ML IV SOLN
INTRAVENOUS | Status: AC
  Filled 2013-02-06: qty 10

## 2013-02-06 MED ORDER — OXYCODONE HCL 5 MG/5ML PO SOLN
5.0000 mg | Freq: Once | ORAL | Status: DC | PRN
  Filled 2013-02-06: qty 5

## 2013-02-06 MED ORDER — MUPIROCIN 2 % EX OINT
TOPICAL_OINTMENT | CUTANEOUS | Status: AC
  Filled 2013-02-06: qty 22

## 2013-02-06 MED ORDER — ONDANSETRON HCL 4 MG/2ML IJ SOLN: 4.0000 mg | Freq: Four times a day (QID) | INTRAMUSCULAR | Status: DC | PRN

## 2013-02-06 MED ORDER — HYDROMORPHONE HCL PF 1 MG/ML IJ SOLN: 0.2500 mg | INTRAMUSCULAR | Status: DC | PRN

## 2013-02-06 MED ORDER — LACTATED RINGERS IV SOLN
INTRAVENOUS | Status: DC
  Administered 2013-02-06: 1000 mL via INTRAVENOUS

## 2013-02-06 MED ORDER — BUPIVACAINE HCL (PF) 0.25 % IJ SOLN
INTRAMUSCULAR | Status: AC
  Filled 2013-02-06: qty 30

## 2013-02-06 MED ORDER — FENTANYL CITRATE 0.05 MG/ML IJ SOLN
INTRAMUSCULAR | Status: DC | PRN
  Administered 2013-02-06: 25 ug via INTRAVENOUS
  Administered 2013-02-06: 50 ug via INTRAVENOUS
  Administered 2013-02-06: 25 ug via INTRAVENOUS

## 2013-02-06 MED ORDER — MEPERIDINE HCL 50 MG/ML IJ SOLN: 6.2500 mg | INTRAMUSCULAR | Status: DC | PRN

## 2013-02-06 MED ORDER — OXYCODONE HCL 5 MG PO TABS: 5.0000 mg | ORAL_TABLET | Freq: Once | ORAL | Status: DC | PRN

## 2013-02-06 MED ORDER — HEPARIN SOD (PORK) LOCK FLUSH 100 UNIT/ML IV SOLN
INTRAVENOUS | Status: DC | PRN
  Administered 2013-02-06: 6000 [IU] via INTRAVENOUS

## 2013-02-06 MED ORDER — LACTATED RINGERS IV SOLN
INTRAVENOUS | Status: DC | PRN
  Administered 2013-02-06 (×2): via INTRAVENOUS

## 2013-02-06 MED ORDER — ACETAMINOPHEN 10 MG/ML IV SOLN: 1000.0000 mg | Freq: Once | INTRAVENOUS | Status: DC | PRN

## 2013-02-06 MED ORDER — VANCOMYCIN HCL IN DEXTROSE 1-5 GM/200ML-% IV SOLN
INTRAVENOUS | Status: AC
  Filled 2013-02-06: qty 200

## 2013-02-06 MED ORDER — KETAMINE HCL 10 MG/ML IJ SOLN
INTRAMUSCULAR | Status: DC | PRN
  Administered 2013-02-06 (×2): 5 mg via INTRAVENOUS

## 2013-02-06 MED ORDER — MUPIROCIN 2 % EX OINT: TOPICAL_OINTMENT | Freq: Two times a day (BID) | CUTANEOUS | Status: DC

## 2013-02-06 MED ORDER — PROMETHAZINE HCL 25 MG/ML IJ SOLN: 6.2500 mg | INTRAMUSCULAR | Status: DC | PRN

## 2013-02-06 MED ORDER — LIDOCAINE-EPINEPHRINE 1 %-1:100000 IJ SOLN
INTRAMUSCULAR | Status: AC
  Filled 2013-02-06: qty 1

## 2013-02-06 MED ORDER — OXYCODONE HCL 5 MG PO TABS: 5.0000 mg | ORAL_TABLET | ORAL | Status: DC | PRN

## 2013-02-06 MED ORDER — MIDAZOLAM HCL 5 MG/5ML IJ SOLN
INTRAMUSCULAR | Status: DC | PRN
  Administered 2013-02-06: 1 mg via INTRAVENOUS

## 2013-02-06 MED ORDER — VANCOMYCIN HCL IN DEXTROSE 1-5 GM/200ML-% IV SOLN
1000.0000 mg | Freq: Once | INTRAVENOUS | Status: AC
  Administered 2013-02-06: 1000 mg via INTRAVENOUS
  Filled 2013-02-06: qty 200

## 2013-02-06 MED ORDER — ACETAMINOPHEN 325 MG PO TABS: 650.0000 mg | ORAL_TABLET | ORAL | Status: DC | PRN

## 2013-02-06 MED ORDER — PROPOFOL 10 MG/ML IV EMUL
INTRAVENOUS | Status: DC | PRN
  Administered 2013-02-06: 75 ug/kg/min via INTRAVENOUS

## 2013-02-06 MED ORDER — SODIUM CHLORIDE 0.9 % IV SOLN
INTRAVENOUS | Status: DC | PRN
  Administered 2013-02-06: 15:00:00 via INTRAVENOUS

## 2013-02-06 MED ORDER — SODIUM CHLORIDE 0.9 % IR SOLN
Freq: Once | Status: DC
  Filled 2013-02-06: qty 1.2

## 2013-02-06 MED ORDER — ONDANSETRON HCL 4 MG/2ML IJ SOLN
INTRAMUSCULAR | Status: DC | PRN
  Administered 2013-02-06: 4 mg via INTRAVENOUS

## 2013-02-06 MED ORDER — SODIUM CHLORIDE 0.9 % IJ SOLN: 3.0000 mL | INTRAMUSCULAR | Status: DC | PRN

## 2013-02-06 SURGICAL SUPPLY — 46 items
BAG DECANTER FOR FLEXI CONT (MISCELLANEOUS) ×2 IMPLANT
BENZOIN TINCTURE PRP APPL 2/3 (GAUZE/BANDAGES/DRESSINGS) ×2 IMPLANT
BLADE HEX COATED 2.75 (ELECTRODE) ×2 IMPLANT
BLADE SURG 15 STRL LF DISP TIS (BLADE) ×1 IMPLANT
BLADE SURG 15 STRL SS (BLADE) ×1
BLADE SURG SZ11 CARB STEEL (BLADE) ×2 IMPLANT
CHLORAPREP W/TINT 26ML (MISCELLANEOUS) ×2 IMPLANT
CLOTH BEACON ORANGE TIMEOUT ST (SAFETY) ×2 IMPLANT
CLSR STERI-STRIP ANTIMIC 1/2X4 (GAUZE/BANDAGES/DRESSINGS) ×2 IMPLANT
DECANTER SPIKE VIAL GLASS SM (MISCELLANEOUS) ×2 IMPLANT
DRAPE C-ARM 42X72 X-RAY (DRAPES) ×2 IMPLANT
DRAPE LAPAROTOMY TRNSV 102X78 (DRAPE) ×2 IMPLANT
DRSG TEGADERM 2-3/8X2-3/4 SM (GAUZE/BANDAGES/DRESSINGS) ×2 IMPLANT
DRSG TEGADERM 4X4.75 (GAUZE/BANDAGES/DRESSINGS) ×2 IMPLANT
DRSG TEGADERM 6X8 (GAUZE/BANDAGES/DRESSINGS) IMPLANT
ELECT REM PT RETURN 9FT ADLT (ELECTROSURGICAL) ×2
ELECTRODE REM PT RTRN 9FT ADLT (ELECTROSURGICAL) ×1 IMPLANT
GAUZE SPONGE 2X2 8PLY STRL LF (GAUZE/BANDAGES/DRESSINGS) ×1 IMPLANT
GAUZE SPONGE 4X4 16PLY XRAY LF (GAUZE/BANDAGES/DRESSINGS) ×2 IMPLANT
GLOVE BIOGEL PI IND STRL 7.0 (GLOVE) ×1 IMPLANT
GLOVE BIOGEL PI INDICATOR 7.0 (GLOVE) ×1
GLOVE ECLIPSE 8.0 STRL XLNG CF (GLOVE) ×2 IMPLANT
GLOVE INDICATOR 8.0 STRL GRN (GLOVE) ×4 IMPLANT
GOWN STRL NON-REIN LRG LVL3 (GOWN DISPOSABLE) ×2 IMPLANT
GOWN STRL REIN XL XLG (GOWN DISPOSABLE) ×4 IMPLANT
KIT BARDPORT ISP (Port) IMPLANT
KIT BARDPORT ISP 9.6FR (PORTABLE EQUIPMENT SUPPLIES) IMPLANT
KIT BASIN OR (CUSTOM PROCEDURE TRAY) ×2 IMPLANT
KIT POWER CATH 8FR (Catheter) ×2 IMPLANT
NEEDLE HYPO 22GX1.5 SAFETY (NEEDLE) IMPLANT
NEEDLE HYPO 25X1 1.5 SAFETY (NEEDLE) ×2 IMPLANT
NS IRRIG 1000ML POUR BTL (IV SOLUTION) ×2 IMPLANT
PACK BASIC VI WITH GOWN DISP (CUSTOM PROCEDURE TRAY) ×2 IMPLANT
PENCIL BUTTON HOLSTER BLD 10FT (ELECTRODE) ×2 IMPLANT
SPONGE GAUZE 2X2 STER 10/PKG (GAUZE/BANDAGES/DRESSINGS) ×1
SPONGE GAUZE 4X4 12PLY (GAUZE/BANDAGES/DRESSINGS) ×2 IMPLANT
STRIP CLOSURE SKIN 1/2X4 (GAUZE/BANDAGES/DRESSINGS) ×2 IMPLANT
SUT MNCRL AB 4-0 PS2 18 (SUTURE) ×2 IMPLANT
SUT VIC AB 2-0 SH 18 (SUTURE) ×4 IMPLANT
SUT VIC AB 2-0 SH 27 (SUTURE)
SUT VIC AB 2-0 SH 27X BRD (SUTURE) IMPLANT
SUT VIC AB 3-0 SH 27 (SUTURE)
SUT VIC AB 3-0 SH 27XBRD (SUTURE) IMPLANT
SYR CONTROL 10ML LL (SYRINGE) ×2 IMPLANT
SYRINGE 10CC LL (SYRINGE) ×2 IMPLANT
TOWEL OR 17X26 10 PK STRL BLUE (TOWEL DISPOSABLE) ×2 IMPLANT

## 2013-02-06 NOTE — Transfer of Care (Signed)
Immediate Anesthesia Transfer of Care Note  Patient: Lori Dennis  Procedure(s) Performed: Procedure(s): Ultrasound guided PORT-A-CATH INSERTION with flouro (Right)  Patient Location: PACU  Anesthesia Type:MAC  Level of Consciousness: awake, alert , oriented, patient cooperative and responds to stimulation  Airway & Oxygen Therapy: Patient Spontanous Breathing and Patient connected to face mask oxygen  Post-op Assessment: Report given to PACU RN, Post -op Vital signs reviewed and stable and Patient moving all extremities X 4  Post vital signs: Reviewed and stable  Complications: No apparent anesthesia complications

## 2013-02-06 NOTE — Progress Notes (Signed)
Pt informed that surgery is delayed by one hour. Pt verbalizes understanding.

## 2013-02-06 NOTE — Anesthesia Postprocedure Evaluation (Signed)
Anesthesia Post Note  Patient: Lori Dennis  Procedure(s) Performed: Procedure(s) (LRB): Ultrasound guided PORT-A-CATH INSERTION with flouro (Right)  Anesthesia type: MAC  Patient location: PACU  Post pain: Pain level controlled  Post assessment: Post-op Vital signs reviewed  Last Vitals: BP 109/68  Pulse 80  Temp(Src) 36.3 C  Resp 14  SpO2 100%  Post vital signs: Reviewed  Level of consciousness: awake  Complications: No apparent anesthesia complications

## 2013-02-06 NOTE — Op Note (Signed)
Preoperative diagnosis:   Metastatic carcinoid  Postoperative diagnosis:  Same  Procedure: Ultrasound-guided Port-A-Cath insertion into right internal jugular vein under fluoroscopy.  Surgeon: Avel Peace M.D.  Anesthesia: Local (Xylocaine) with MAC.  Indication:  This is a 67 year old female with metastatic carcinoid. She has intermittent nausea and vomiting likely because of tumor burden and possibly a partial small bowel obstruction. She needs long-term venous access for a rehydration in intermittent nutritional support. She presents for Port-A-Cath insertion.  Technique: She was brought to the operating room, placed supine on the operating table, and intravenous sedation was given. A roll was placed under the back and the arms were tucked. Hair on the chest wall was clipped as was necessary. The upper chest wall and neck were sterilely prepped and draped.  Her head was rotated to the left. Using the ultrasound, the right internal jugular vein was identified. Local anesthetic was infiltrated in the skin and subcutaneous tissue just anterior to it. A 16-gauge needle was used to cannulate the right internal jugular vein under ultrasound guidance. A wire was then threaded through the needle into the internal jugular vein and down into the right heart under ultrasound and fluoroscopic guidance.  Local anesthetic was infiltrated into the right upper chest wall. A right upper chest wall incision was made and a pocket was created for the Portacath.  An incision was made around the wire in the neck. The catheter was then tunneled from the chest wall incision up through the neck incision.  A dilator- introducer complex was placed over the wire into the superior vena cava. The dilator and wire were then removed and the catheter was threaded through the peel-away sheath introducer into the right heart. The introducer was then peeled away and removed. Under fluoroscopic guidance, the tip of the catheter  was then pulled back until it was at the junction of the superior vena cava and right atrium. The catheter was then connected to the port.  The port aspirated blood and flushed easily.  The port was then anchored to the chest wall with 2-0 Vicryl suture. The port and catheter position were then verified using fluoroscopy. The subcutaneous tissue was then closed over the port with running 3-0 Vicryl suture. The skin incisions were then closed with 4-0 Monocryl subcuticular stitches.  The port was accessed with a Huber needle and flushed. The needle was left in place. Steri-Strips and sterile dressings were applied.  The procedure was well-tolerated without any apparent complications. The patient was taken to the recovery room in satisfactory condition where a portable chest x-ray is pending.

## 2013-02-06 NOTE — Telephone Encounter (Signed)
Referral and chart information faxed to [facility] [phone] att: Eunice Blase to begin home TNA tomorrow if possible. Called and confirmed that chart info and referral was received.

## 2013-02-06 NOTE — H&P (Signed)
Lori Dennis is an 67 y.o. female.   Chief Complaint: Here for Port-A-Cath insertion HPI: This is a 67 year old female with a long history of metastatic carcinoid. She's had intermittent problems with nausea and vomiting likely secondary to tumor burden and possible partial small bowel junction. She needs long-term intravenous access for intermittent nutritional support and hydration. She presents now for Port-A-Cath insertion.  Past Medical History  Diagnosis Date  . Complication of anesthesia     slow to wake up  . Blood transfusion without reported diagnosis 2009 and 2011  . Hypertension     off all bp meds since Dec 20, 2012  . Chronic kidney disease 12-19-2012    elevated bun and creatinine   . Cancer 02/19/2008    Metastatic carcinoid tumor    Past Surgical History  Procedure Laterality Date  . Bowel resection  06/08/2010    ileocolonic resection, enteroenterostomy  . Left colectomy  04/02/2008    Exploratory laparotomy, biopsy of omental nodule, sigmoid colectomy  . Vaginal hysterectomy      at age 75  . Cholecystectomy      age 24    Family History  Problem Relation Age of Onset  . Heart disease Mother   . Cancer Sister     ovarian   Social History:  reports that she has never smoked. She has never used smokeless tobacco. She reports that she does not drink alcohol or use illicit drugs.  Allergies: No Known Allergies  Medications Prior to Admission  Medication Sig Dispense Refill  . LORazepam (ATIVAN) 0.5 MG tablet Take 1 tablet (0.5 mg total) by mouth every 6 (six) hours as needed for anxiety.  30 tablet  1  . Multiple Vitamin (MULTIVITAMIN) capsule Take 1 capsule by mouth daily.       Marland Kitchen dronabinol (MARINOL) 2.5 MG capsule Take 1 capsule (2.5 mg total) by mouth 2 (two) times daily before a meal.  60 capsule  0  . lidocaine-prilocaine (EMLA) cream Apply topically as needed. Apply to [facility] site 1 hour prior to stick and cover with plastic wrap to numb site  30 g  prn   . ondansetron (ZOFRAN) 8 MG tablet Take 1 tablet (8 mg total) by mouth every 12 (twelve) hours as needed for nausea.  30 tablet  1  . promethazine (PHENERGAN) 12.5 MG tablet Take 1/2 - 1 tablet PO Q6h prn nausea  30 tablet  1    No results found for this or any previous visit (from the past 48 hour(s)). No results found.  Review of Systems  Constitutional: Negative for fever and chills.  Gastrointestinal: Positive for nausea.    Blood pressure 134/78, pulse 83, temperature 97.9 F (36.6 C), resp. rate 20, SpO2 99.00%. Physical Exam  Constitutional: No distress.  Thin female.  Neck: Neck supple.  Cardiovascular: Normal rate and regular rhythm.   Respiratory: Effort normal.  GI: Soft.  Left upper quadrant colostomy. Midline scar with some firm nodular-like areas deep to it.  Musculoskeletal: She exhibits no edema.  Lymphadenopathy:    She has no cervical adenopathy.  Skin: Skin is warm and dry.     Assessment/Plan Metastatic carcinoid tumor. Needs long-term venous access.  Plan ultrasound-guided Port-A-Cath insertion.  The procedure risks and aftercare been explained. Risks include but are not limited to bleeding, infection, malfunction, pneumothorax, wound problems, DVT.  Shital Crayton J 02/06/2013, 2:03 PM

## 2013-02-06 NOTE — Anesthesia Preprocedure Evaluation (Addendum)
Anesthesia Evaluation  Patient identified by MRN, date of birth, ID band Patient awake    Reviewed: Allergy & Precautions, H&P , NPO status , Patient's Chart, lab work & pertinent test results  History of Anesthesia Complications Negative for: history of anesthetic complications  Airway Mallampati: II TM Distance: >3 FB Neck ROM: Full    Dental  (+) Dental Advisory Given and Teeth Intact   Pulmonary neg pulmonary ROS,  breath sounds clear to auscultation        Cardiovascular hypertension, negative cardio ROS  Rhythm:Regular Rate:Normal     Neuro/Psych negative neurological ROS  negative psych ROS   GI/Hepatic negative GI ROS, Neg liver ROS,   Endo/Other  negative endocrine ROS  Renal/GU Renal Insufficiency and CRFRenal disease     Musculoskeletal negative musculoskeletal ROS (+)   Abdominal   Peds  Hematology negative hematology ROS (+)   Anesthesia Other Findings   Reproductive/Obstetrics                          Anesthesia Physical Anesthesia Plan  ASA: III  Anesthesia Plan: MAC   Post-op Pain Management:    Induction: Intravenous  Airway Management Planned: Simple Face Mask  Additional Equipment:   Intra-op Plan:   Post-operative Plan:   Informed Consent: I have reviewed the patients History and Physical, chart, labs and discussed the procedure including the risks, benefits and alternatives for the proposed anesthesia with the patient or authorized representative who has indicated his/her understanding and acceptance.   Dental advisory given  Plan Discussed with: CRNA  Anesthesia Plan Comments:         Anesthesia Quick Evaluation

## 2013-02-07 ENCOUNTER — Encounter (HOSPITAL_COMMUNITY): Payer: Self-pay | Admitting: General Surgery

## 2013-02-07 ENCOUNTER — Ambulatory Visit: Payer: Medicare Other | Admitting: Oncology

## 2013-02-07 ENCOUNTER — Telehealth (INDEPENDENT_AMBULATORY_CARE_PROVIDER_SITE_OTHER): Payer: Self-pay | Admitting: General Surgery

## 2013-02-07 NOTE — Telephone Encounter (Signed)
Spoke with this patient she states she was doing well and was instructed to contact [facility] office with any issues.She was not instructed to make a PO F/U visit at this time

## 2013-02-08 ENCOUNTER — Ambulatory Visit: Payer: Medicare Other | Admitting: Oncology

## 2013-02-09 NOTE — Telephone Encounter (Signed)
Close encounter 

## 2013-02-22 ENCOUNTER — Telehealth: Payer: Self-pay | Admitting: Oncology

## 2013-02-22 ENCOUNTER — Ambulatory Visit (HOSPITAL_BASED_OUTPATIENT_CLINIC_OR_DEPARTMENT_OTHER): Payer: Medicare Other | Admitting: Nurse Practitioner

## 2013-02-22 ENCOUNTER — Ambulatory Visit: Payer: Medicare Other | Admitting: Oncology

## 2013-02-22 VITALS — BP 143/84 | HR 89 | Temp 97.5°F | Resp 18 | Ht 63.0 in | Wt 136.3 lb

## 2013-02-22 DIAGNOSIS — C7A019 Malignant carcinoid tumor of the small intestine, unspecified portion: Secondary | ICD-10-CM

## 2013-02-22 DIAGNOSIS — Z933 Colostomy status: Secondary | ICD-10-CM

## 2013-02-22 DIAGNOSIS — C787 Secondary malignant neoplasm of liver and intrahepatic bile duct: Secondary | ICD-10-CM

## 2013-02-22 NOTE — Progress Notes (Signed)
OFFICE PROGRESS NOTE  Interval history:  Lori Dennis returns as scheduled. She underwent placement of a Port-A-Cath on 02/06/2013. TPN has been initiated. The TPN is currently infusing 16 hours per day. She anticipates decreasing the infusion to 12 hours per day in the near future. The nausea is significantly better. She has had 2 episodes of vomiting over the past 7-10 days. She is taking Marinol. Minimal by mouth intake. She has good output from the ostomy. She continues to note abdominal pain at the right side with walking. The pain resolves when she sits.   Objective: There were no vitals taken for this visit.  Oropharynx is without thrush or ulceration. Lungs are clear. Regular cardiac rhythm. Port-A-Cath is accessed; site is without erythema. Firm fullness at the right lower abdomen. Left mid abdomen colostomy. No hepatomegaly. Extremities are without edema.  Lab Results: Lab Results  Component Value Date   WBC 8.4 01/29/2013   HGB 13.5 01/29/2013   HCT 40.9 01/29/2013   MCV 91.2 01/29/2013   PLT 194 01/29/2013    Chemistry:    Chemistry      Component Value Date/Time   NA 138 01/26/2013 1322   NA 133* 12/26/2012 0545   NA 144 06/26/2012 1401   K 3.5 01/26/2013 1322   K 4.1 12/26/2012 0545   K 5.1* 06/26/2012 1401   CL 92* 01/26/2013 1322   CL 97 12/26/2012 0545   CL 97* 06/26/2012 1401   CO2 27 01/26/2013 1322   CO2 28 12/26/2012 0545   CO2 29 06/26/2012 1401   BUN 27.9* 01/29/2013 1254   BUN 17 12/26/2012 0545   BUN 20 06/26/2012 1401   CREATININE 1.3* 01/29/2013 1254   CREATININE 0.73 12/26/2012 0545   CREATININE 1.0 06/26/2012 1401      Component Value Date/Time   CALCIUM 9.0 01/26/2013 1322   CALCIUM 8.7 12/26/2012 0545   CALCIUM 8.2 06/26/2012 1401   ALKPHOS 152* 12/25/2012 0423   ALKPHOS 280* 12/11/2012 1245   AST 24 12/25/2012 0423   AST 62* 12/11/2012 1245   ALT 20 12/25/2012 0423   ALT 63* 12/11/2012 1245   BILITOT 0.5 12/25/2012 0423   BILITOT 0.98 12/11/2012 1245       Studies/Results: Dg  Chest 2 View  02/02/2013  *RADIOLOGY REPORT*  Clinical Data: Preop PICC insertion  CHEST - 2 VIEW  Comparison: 06/08/2010  Findings: Lungs are clear.  The cardiomediastinal silhouette, bones and soft tissues are unchanged.  IMPRESSION: No acute cardiopulmonary disease.   Original Report Authenticated By: Elberta Fortis, M.D.    US Renal  01/24/2013  *RADIOLOGY REPORT*  Clinical Data: Acute renal failure.  RENAL/URINARY TRACT ULTRASOUND COMPLETE  Comparison:  CT scan 12/18/2012.  Findings:  Right Kidney:  10.9 cm in length. Normal renal cortical thickness and echogenicity without focal lesions or hydronephrosis.  Possible small mid pole calculus measuring 5 mm.  Left Kidney:  10.0 cm in length.  A 1 cm lower pole cyst is noted. Too small renal calculi are suspected.  Normal renal cortical thickness and echogenicity.  No hydronephrosis.  Bladder:  Poorly distended.  No obvious abnormality.  IMPRESSION:  1.  Probable small bilateral renal calculi. 2.  Normal renal cortical thickness and echogenicity and no hydronephrosis.   Original Report Authenticated By: Rudie Meyer, M.D.    Dg Chest Port 1 View  02/06/2013  *RADIOLOGY REPORT*  Clinical Data: Port-A-Cath placement  PORTABLE CHEST - 1 VIEW  Comparison: 02/02/2013  Findings: New right internal jugular  vein Port-A-Cath with its tip in the mid SVC.  No pneumothorax.  Normal heart size.  Clear lungs.  IMPRESSION: Right internal jugular vein Port-A-Cath placement with its tip in the mid SVC and no pneumothorax.   Original Report Authenticated By: Jolaine Click, M.D.    Dg C-arm 1-60 Min-no Report  02/06/2013  CLINICAL DATA: surgery   C-ARM 1-60 MINUTES  Fluoroscopy was utilized by the requesting physician.  No radiographic  interpretation.      Medications: I have reviewed the patient's current medications.  Assessment/Plan:  1.Metastatic low grade carcinoid tumor, s/p 6 cycles of xeloda/temozolamide beginnning 09/06/2010.  -The chromagranin A and 24 hour  Urine 5-HIAA were not significantly changed on 07/26/2011.  -Restaging CT 06/26/2012 with overall stable disease compared to a CT from June of 2011  2. Admission with SBO/SB infarction 06/08/2010, s/p resection of small bowel and colon.  3. S/p sigmoid colectomy/colostomy, right colectomy, and gastrojejunostomy in April 2009.  4. Elevated pre-operative 24-hour urine 5-HIAA.  5. Acute N/V April 2011, likely related to a partial small bowel obstruction due to tumor. Symptoms resolved with bowel rest and a liquid diet.  6. Intermittent loose stools-? Secondary to bowel resection or carcinoid. Partially improved with Sandostatin LAR. She is no longer on Sandostatin. 7. Intermittent episodes of nausea and right buttock pain, resolving spontaneously-? related to carcinomatosis. The nausea is better. 8. Right scalp herpes zoster rash July 2013  10. Admission with a small bowel obstruction 12/19/2012-status post NG tube decompression/bowel rest, CT the abdomen on 12/18/2012 revealed dilated small bowel loops and stomach, mild progression of capsular liver lesions, and no significant change in the pelvic masses . She continues to have intermittent obstructive symptoms. The nausea initially improved with low-dose prednisone, but she has developed progressive nausea with intermittent emesis.  11. Elevated BUN and creatinine 01/26/2013-likely secondary to dehydration, a renal ultrasound on 01/24/2013 negative for hydronephrosis, the BUN/creatinine were improved after receiving intravenous fluids. 12. Port-A-Cath placement 02/06/2013. 13. Nutrition. She is maintained on TPN.  Disposition-Ms. Bebee appears improved. Her performance status is better. She will continue the TPN. She will return for a followup visit with Dr. Truett Perna in one month. She will contact the office in the interim with any problems.  Plan reviewed with Dr. Truett Perna.   Lori Dennis ANP/GNP-BC

## 2013-02-26 ENCOUNTER — Telehealth: Payer: Self-pay | Admitting: *Deleted

## 2013-02-26 NOTE — Telephone Encounter (Signed)
Call from [place], Pharmacist with [facility] requesting order for IVFluids. Pt is having increased N/V. Reviewed with Dr. Truett Perna: OK to administer 0.45 normal saline 1Liter. Telephone order given to [place].

## 2013-03-01 ENCOUNTER — Telehealth: Payer: Self-pay | Admitting: *Deleted

## 2013-03-01 NOTE — Telephone Encounter (Addendum)
Reports vomiting 4-6 times/day since 02/23/13. Emesis is thick, mucous-like yellow brown color. Feels nausea all day. Still has ostomy output, but less so. Passing gas. Stomach is "sore" from vomiting so much. TPN continues for 16 hours/day. Asking if there is anything else that can be done for her comfort?  Called pt & reported per Dr Truett Perna that we can try a G-tube.  She states that they have talked about this & the surgeon says there may not be enough room due to having an ileostomy & scarring.  We discussed clear liquids for a while; she reports that she isn't eating much at all.  We also discussed trying to take the zofran q 12 hr on a reg basis for a few days & use the phenergan as back-up prn.  She states she slept well last hs with the phenergan. She will call back with further problems.  M. Dorethea Clan RN

## 2013-03-07 ENCOUNTER — Other Ambulatory Visit: Payer: Self-pay | Admitting: Oncology

## 2013-03-07 DIAGNOSIS — R112 Nausea with vomiting, unspecified: Secondary | ICD-10-CM

## 2013-03-07 DIAGNOSIS — D490 Neoplasm of unspecified behavior of digestive system: Secondary | ICD-10-CM

## 2013-03-08 ENCOUNTER — Encounter: Payer: Self-pay | Admitting: *Deleted

## 2013-03-08 NOTE — Progress Notes (Signed)
RECEIVED A FAX FROM [facility] CONCERNING A PRIOR AUTHORIZATION FOR ONDANSETRON. THIS REQUEST WAS GIVEN TO MANAGED [facility]ARE. 

## 2013-03-09 ENCOUNTER — Encounter: Payer: Self-pay | Admitting: Oncology

## 2013-03-09 NOTE — Progress Notes (Signed)
[employer], [id number], approved ondansetron 8mg  from 02/07/13-03/09/14.

## 2013-03-19 ENCOUNTER — Encounter: Payer: Self-pay | Admitting: Oncology

## 2013-03-21 ENCOUNTER — Encounter: Payer: Self-pay | Admitting: Oncology

## 2013-03-22 ENCOUNTER — Ambulatory Visit (HOSPITAL_BASED_OUTPATIENT_CLINIC_OR_DEPARTMENT_OTHER): Payer: Medicare Other | Admitting: Oncology

## 2013-03-22 ENCOUNTER — Telehealth: Payer: Self-pay | Admitting: Oncology

## 2013-03-22 VITALS — BP 126/78 | HR 86 | Temp 97.7°F | Resp 18 | Ht 63.0 in | Wt 135.2 lb

## 2013-03-22 DIAGNOSIS — D3A098 Benign carcinoid tumors of other sites: Secondary | ICD-10-CM

## 2013-03-22 DIAGNOSIS — D490 Neoplasm of unspecified behavior of digestive system: Secondary | ICD-10-CM

## 2013-03-22 MED ORDER — ONDANSETRON HCL 8 MG PO TABS: 8.0000 mg | ORAL_TABLET | Freq: Two times a day (BID) | ORAL | Status: DC

## 2013-03-22 NOTE — Telephone Encounter (Signed)
, °

## 2013-03-22 NOTE — Progress Notes (Signed)
   [facility]    OFFICE PROGRESS NOTE   INTERVAL HISTORY:   She returns as scheduled. She continues home TNA. The TNA has now cycled over 16 hours each day. 2 weeks ago she had increased nausea and vomiting for approximately one week. This has now resolved. She has vomited once this week. She has limited oral intake. The ostomy is functioning.  Objective:  Vital signs in last 24 hours:  Blood pressure 126/78, pulse 86, temperature 97.7 F (36.5 C), temperature source Oral, resp. rate 18, height 5\' 3"  (1.6 m), weight 135 lb 3.2 oz (61.326 kg).    HEENT: No thrush or ulcers Resp: Lungs clear bilaterally Cardio: Regular rate and rhythm GI: No hepatomegaly, no apparent ascites, firmness throughout the right lower abdomen, brown liquid stool in the ostomy bag Vascular: No leg edema  Portacath/PICC-mild erythema at the Port-A-Cath incision  Lab Results:  03/05/2013-potassium 4.6, BUN 50, creatinine 1.06, CO2 19, albumin 3.6  Medications: I have reviewed the patient's current medications.  Assessment/Plan: 1.Metastatic low grade carcinoid tumor, s/p 6 cycles of xeloda/temozolamide beginnning 09/06/2010.  -The chromagranin A and 24 hour Urine 5-HIAA were not significantly changed on 07/26/2011.  -Restaging CT 06/26/2012 with overall stable disease compared to a CT from June of 2011  2. Admission with SBO/SB infarction 06/08/2010, s/p resection of small bowel and colon.  3. S/p sigmoid colectomy/colostomy, right colectomy, and gastrojejunostomy in April 2009.  4. Elevated pre-operative 24-hour urine 5-HIAA.  5. Acute N/V April 2011, likely related to a partial small bowel obstruction due to tumor. Symptoms resolved with bowel rest and a liquid diet.  6. Intermittent loose stools-? Secondary to bowel resection or carcinoid. Partially improved with Sandostatin LAR. She is no longer on Sandostatin.  7. Intermittent episodes of nausea and right buttock pain, resolving  spontaneously-? related to carcinomatosis.  8. Right scalp herpes zoster rash July 2013  10. Admission with a small bowel obstruction 12/19/2012-status post NG tube decompression/bowel rest, CT the abdomen on 12/18/2012 revealed dilated small bowel loops and stomach, mild progression of capsular liver lesions, and no significant change in the pelvic masses . She continues to have intermittent obstructive symptoms. The nausea initially improved with low-dose prednisone, but she has developed progressive nausea with intermittent emesis.  11. Elevated BUN and creatinine 01/26/2013-likely secondary to dehydration, a renal ultrasound on 01/24/2013 negative for hydronephrosis, the BUN/creatinine were improved after receiving intravenous fluids.  12. Port-A-Cath placement 02/06/2013.  13. Nutrition. She is maintained on TPN.   Disposition:  Her performance status has improved since starting home TPN. She continues to have intermittent episodes of nausea/vomiting, likely related to partial bowel obstruction. She does not wish to undergo placement of a gastrostomy feeding tube at present. It would be difficult for her to tolerate an oral regimen with Afinitor or Sutent. She will remain off of Sandostatin and systemic therapy for now.  Ms. Halbur will continue home TPN and return for an office visit in one month. There is mild erythema at the Port-A-Cath site today. She knows to contact us for a fever or increased erythema or tenderness at the Port-A-Cath site.   Thornton Papas, MD  03/22/2013  3:37 PM

## 2013-03-23 ENCOUNTER — Telehealth: Payer: Self-pay | Admitting: Dietician

## 2013-03-29 ENCOUNTER — Encounter: Payer: Self-pay | Admitting: Oncology

## 2013-04-10 ENCOUNTER — Encounter: Payer: Self-pay | Admitting: Oncology

## 2013-04-11 ENCOUNTER — Encounter: Payer: Self-pay | Admitting: Oncology

## 2013-04-19 ENCOUNTER — Ambulatory Visit (HOSPITAL_BASED_OUTPATIENT_CLINIC_OR_DEPARTMENT_OTHER): Payer: Medicare Other | Admitting: Oncology

## 2013-04-19 ENCOUNTER — Telehealth: Payer: Self-pay | Admitting: Oncology

## 2013-04-19 VITALS — BP 141/86 | HR 91 | Temp 97.1°F | Resp 18 | Ht 63.0 in | Wt 131.7 lb

## 2013-04-19 DIAGNOSIS — D490 Neoplasm of unspecified behavior of digestive system: Secondary | ICD-10-CM

## 2013-04-19 DIAGNOSIS — R112 Nausea with vomiting, unspecified: Secondary | ICD-10-CM

## 2013-04-19 DIAGNOSIS — D3A098 Benign carcinoid tumors of other sites: Secondary | ICD-10-CM

## 2013-04-19 MED ORDER — PROMETHAZINE HCL 12.5 MG RE SUPP: 12.5000 mg | Freq: Four times a day (QID) | RECTAL | Status: DC | PRN

## 2013-04-19 NOTE — Telephone Encounter (Signed)
gv and printed appt sched and avs for pt for May °

## 2013-04-19 NOTE — Progress Notes (Signed)
   [facility]    OFFICE PROGRESS NOTE   INTERVAL HISTORY:   She returns as scheduled. She continues daily TNA, cycled in the evening. She has intermittent nausea and vomiting. Phenergan helped the nausea, but causes somnolence. No significant pain. Her energy level has improved since beginning the TNA.  Objective:  Vital signs in last 24 hours:  Blood pressure 141/86, pulse 91, temperature 97.1 F (36.2 C), temperature source Oral, resp. rate 18, height 5\' 3"  (1.6 m), weight 131 lb 11.2 oz (59.739 kg).    HEENT: No thrush or ulcers Resp: Lungs clear bilaterally Cardio: Regular rate and rhythm GI: Fullness in the right abdomen without a discrete mass. No hepatomegaly, nontender Vascular: No leg edema   Portacath-mild erythema at the neck puncture site, no tenderness along the subcutaneous tract. Mild erythema corresponding to a tape distribution at the upper aspect of the Port-A-Cath.  Lab Results:  03/25/2013-BUN 32, creatinine 0.94, albumin 2.9   Medications: I have reviewed the patient's current medications.  Assessment/Plan: 1.Metastatic low grade carcinoid tumor, s/p 6 cycles of xeloda/temozolamide beginnning 09/06/2010.  -The chromagranin A and 24 hour Urine 5-HIAA were not significantly changed on 07/26/2011.  -Restaging CT 06/26/2012 with overall stable disease compared to a CT from June of 2011  2. Admission with SBO/SB infarction 06/08/2010, s/p resection of small bowel and colon.  3. S/p sigmoid colectomy/colostomy, right colectomy, and gastrojejunostomy in April 2009.  4. Elevated pre-operative 24-hour urine 5-HIAA.  5. Acute N/V April 2011, likely related to a partial small bowel obstruction due to tumor. Symptoms resolved with bowel rest and a liquid diet.  6. Intermittent loose stools-? Secondary to bowel resection or carcinoid. Partially improved with Sandostatin LAR. She is no longer on Sandostatin.  7. Intermittent episodes of nausea and right  buttock pain, resolving spontaneously-? related to carcinomatosis.  8. Right scalp herpes zoster rash July 2013  10. Admission with a small bowel obstruction 12/19/2012-status post NG tube decompression/bowel rest, CT the abdomen on 12/18/2012 revealed dilated small bowel loops and stomach, mild progression of capsular liver lesions, and no significant change in the pelvic masses . She continues to have intermittent obstructive symptoms. The nausea initially improved with low-dose prednisone, but she has developed progressive nausea with intermittent emesis.  11. Elevated BUN and creatinine 01/26/2013-likely secondary to dehydration, a renal ultrasound on 01/24/2013 negative for hydronephrosis, the BUN/creatinine were improved after receiving intravenous fluids.  12. Port-A-Cath placement 02/06/2013.  13. Nutrition. She is maintained on TPN.   Disposition:  Ms. Peer appears stable. She continues to have intermittent nausea and vomiting. Her overall performance status has improved since beginning TPN. She will continue the TPN. She will keep an eye on the Port-A-Cath site and let us know if the erythema progresses. We decided against referring her for a palliative gastrostomy tube.  She will try a Phenergan suppository for the nausea. Ms. Camba will return for an office visit in one month.   Thornton Papas, MD  04/19/2013  5:51 PM

## 2013-04-23 ENCOUNTER — Encounter: Payer: Self-pay | Admitting: Oncology

## 2013-04-30 ENCOUNTER — Telehealth: Payer: Self-pay | Admitting: Dietician

## 2013-05-04 ENCOUNTER — Telehealth: Payer: Self-pay | Admitting: *Deleted

## 2013-05-04 NOTE — Telephone Encounter (Signed)
Asking if OK to give extra IVF at home since she had such a bad week this week with nausea and vomiting. Thinks she is dehydrated. Has lost 3 lbs.  OK to administer extra IVF per Dr. Truett Perna- notified [facility] OK for extra IV hydration per protocol.

## 2013-05-17 ENCOUNTER — Telehealth: Payer: Self-pay | Admitting: Oncology

## 2013-05-17 ENCOUNTER — Ambulatory Visit (HOSPITAL_BASED_OUTPATIENT_CLINIC_OR_DEPARTMENT_OTHER): Payer: Medicare Other | Admitting: Oncology

## 2013-05-17 ENCOUNTER — Other Ambulatory Visit: Payer: Self-pay | Admitting: *Deleted

## 2013-05-17 VITALS — BP 113/70 | HR 83 | Temp 97.1°F | Resp 18 | Ht 63.0 in | Wt 128.9 lb

## 2013-05-17 DIAGNOSIS — R197 Diarrhea, unspecified: Secondary | ICD-10-CM

## 2013-05-17 DIAGNOSIS — C180 Malignant neoplasm of cecum: Secondary | ICD-10-CM

## 2013-05-17 DIAGNOSIS — D3A098 Benign carcinoid tumors of other sites: Secondary | ICD-10-CM

## 2013-05-17 MED ORDER — SUNITINIB MALATE 25 MG PO CAPS: 25.0000 mg | ORAL_CAPSULE | Freq: Every day | ORAL | Status: DC

## 2013-05-17 NOTE — Progress Notes (Signed)
   [facility]    OFFICE PROGRESS NOTE   INTERVAL HISTORY:   She returns as scheduled. She continues home TNA. Lori Dennis reports increased nausea over the past month. She vomits every day and on some days she has multiple episodes of vomiting. She is eating very little. No problem with fluids. She continues to have ostomy output on the days when she vomits. She has developed mild abdominal pain. Phenergan helped the nausea, but causes somnolence.  Objective:  Vital signs in last 24 hours:  Blood pressure 113/70, pulse 83, temperature 97.1 F (36.2 C), resp. rate 18, height 5\' 3"  (1.6 m), weight 128 lb 14.4 oz (58.469 kg).    HEENT: No thrush or ulcers Resp: Lungs clear bilaterally Cardio: Regular rate and rhythm GI: No hepatosplenomegaly, firmness in the right abdomen without a discrete mass, left abdomen ostomy  Vascular: No leg edema  Portacath/PICC-without erythema   Medications: I have reviewed the patient's current medications.  Assessment/Plan: 1.Metastatic low grade carcinoid tumor, s/p 6 cycles of xeloda/temozolamide beginnning 09/06/2010.  -The chromagranin A and 24 hour Urine 5-HIAA were not significantly changed on 07/26/2011.  -Restaging CT 06/26/2012 with overall stable disease compared to a CT from June of 2011  2. Admission with SBO/SB infarction 06/08/2010, s/p resection of small bowel and colon.  3. S/p sigmoid colectomy/colostomy, right colectomy, and gastrojejunostomy in April 2009.  4. Elevated pre-operative 24-hour urine 5-HIAA.  5. Acute N/V April 2011, likely related to a partial small bowel obstruction due to tumor. Symptoms resolved with bowel rest and a liquid diet.  6. Intermittent loose stools-? Secondary to bowel resection or carcinoid. Partially improved with Sandostatin LAR. She is no longer on Sandostatin.  7. Intermittent episodes of nausea and right buttock pain, resolving spontaneously-? related to carcinomatosis.  8. Right  scalp herpes zoster rash July 2013  10. Admission with a small bowel obstruction 12/19/2012-status post NG tube decompression/bowel rest, CT the abdomen on 12/18/2012 revealed dilated small bowel loops and stomach, mild progression of capsular liver lesions, and no significant change in the pelvic masses . She continues to have intermittent obstructive symptoms. The nausea initially improved with low-dose prednisone, but she has developed progressive nausea with intermittent emesis.  11. Elevated BUN and creatinine 01/26/2013-likely secondary to dehydration, a renal ultrasound on 01/24/2013 negative for hydronephrosis, the BUN/creatinine were improved after receiving intravenous fluids.  12. Port-A-Cath placement 02/06/2013.  13. Nutrition. She is maintained on TPN.  14. Persistent nausea and vomiting-likely secondary to carcinomatosis with intermittent bowel obstruction or a direct nausea affect from the cancer.   Disposition:  Lori Dennis reports increased nausea over the past month. I discussed treatment options with Lori Dennis and her daughter. We discussed systemic treatment options and placement of a palliative gastrostomy tube. We discussed 5-FU/streptozocin and sunitinib. She understands the gastrostomy tube would likely alleviate the vomiting, but may not relieve her nausea.  We decided to begin a trial of sunitinib. She was given reading materials of sunitinib. I reviewed the potential toxicities associated with sunitinib including the chance for mucositis, hematologic toxicity, skin rash, bleeding, and thromboembolic disease. She will return a 24 urine for a baseline 5 HIAA on 05/21/2013. We will ask the home care nurse to draw a chromogranin A level on 05/18/2013.  She will return for an office visit in one month.   Thornton Papas, MD  05/17/2013  3:38 PM

## 2013-05-17 NOTE — Telephone Encounter (Signed)
#   [id number] order for [facility] to draw labs per Dr. Truett Perna.  5/30 Chromagranin level, 06/01/13 CBC/CMET, 06/08/13 CBC.

## 2013-05-17 NOTE — Telephone Encounter (Signed)
Gave pt appt for june MD and lab on June 2014

## 2013-05-18 NOTE — Telephone Encounter (Signed)
RECEIVED A FAX FROM [facility] CONCERNING A CONFIRMATION OF FACSIMILE RECEIPT FOR PT. REFERRAL. 

## 2013-05-21 ENCOUNTER — Other Ambulatory Visit: Payer: Medicare Other | Admitting: Lab

## 2013-05-21 DIAGNOSIS — D3A098 Benign carcinoid tumors of other sites: Secondary | ICD-10-CM

## 2013-05-25 ENCOUNTER — Other Ambulatory Visit: Payer: Self-pay | Admitting: Oncology

## 2013-05-25 LAB — 5 HIAA, QUANTITATIVE, URINE, 24 HOUR: 5-HIAA, 24 Hr Urine: 104.1 mg/24 h — ABNORMAL HIGH (ref <=6.0)

## 2013-05-25 NOTE — Progress Notes (Signed)
I got a call from [facility] [place].  Pt's Glc was reported to be 26.  Pt is TPN dependent and has not been able to eat much due to her metastatic carcinoid.  She was otherwise asymptomatic without confusion, AMS, syncope.  I advised home care nurse to contact their pharmacist who is in charge of TPN to ensure that the TPN can be adjusted to correct her hypoglycemia and that the line was working.   I advised nursing staff to have patient to the ED if she has concerning symptoms listed above.

## 2013-05-30 ENCOUNTER — Telehealth: Payer: Self-pay | Admitting: *Deleted

## 2013-05-30 NOTE — Telephone Encounter (Signed)
Called pt to check on her to see how she is doing.  Dr. Gaylyn Rong had received a call last fri with a report of glucose 22.  The pt reports that [facility] came back Sat 05/26/13 & was told that glucose normal.  She reports that she is not great but feeling better & thinks that some of what she is feeling is related to the sutent.  She reports that the nurse will be back in this Friday.

## 2013-06-08 ENCOUNTER — Telehealth: Payer: Self-pay | Admitting: *Deleted

## 2013-06-08 NOTE — Telephone Encounter (Signed)
POTASSIUM 2.7 PT. IS NOT ON POTASSIUM. CO2 43. THIS NOTE TO DR.SHERRILL'S NURSE, SUSAN COWARD,RN.

## 2013-06-08 NOTE — Telephone Encounter (Signed)
Spoke with [facility] pharmacy per Dr. Truett Perna.  Orders given to replete K+ in TNA per pharmacy and give extra liter of NS daily.  Miranda verbalized understanding of orders.

## 2013-06-11 ENCOUNTER — Telehealth: Payer: Self-pay | Admitting: *Deleted

## 2013-06-11 NOTE — Telephone Encounter (Signed)
Voice mail reporting significant N/V since Saturday night. Felt her abdomen was swollen Saturday, but better today. Still getting her TNA at night and was getting extra IVF over weekend. Anti emetics are minimally helpful.Asking what else to do? Per Dr. Truett Perna : NPO-clear liquids only. Continue TNA and continue extra IV fluid (1 liter/day). Stop Sutent. Need to get her in with surgeon to find out how G-tube can be placed to help her be more comfortable. Notified daughter via voice mail message.

## 2013-06-12 ENCOUNTER — Telehealth: Payer: Self-pay | Admitting: *Deleted

## 2013-06-12 NOTE — Telephone Encounter (Signed)
Called patient to follow up on her status today: Feeling better and has not vomited since 3 pm yesterday. She was feeling well, so she took her Sutent today. Does not wish to see surgeon at this time. Not eating. Reports orthostatic hypotension and feels lightheaded. Reports her labs were checked yesterday by [facility]. Called [facility] to follow up on lab results.

## 2013-06-12 NOTE — Telephone Encounter (Signed)
Spoke with Lori Dandy about labs-K+ still low at 2.9, but better. [facility] will make adjustment to her TNA today. She reports she vomited small amount X 1 today. Feeling better now. Per Lori Dennis will hold off on calling surgeon/extra IVF until he sees her Thursday unless dramatic change in condition occurs.

## 2013-06-13 ENCOUNTER — Encounter: Payer: Self-pay | Admitting: Oncology

## 2013-06-14 ENCOUNTER — Telehealth: Payer: Self-pay | Admitting: Oncology

## 2013-06-14 ENCOUNTER — Ambulatory Visit (HOSPITAL_BASED_OUTPATIENT_CLINIC_OR_DEPARTMENT_OTHER): Payer: Medicare Other | Admitting: Oncology

## 2013-06-14 VITALS — BP 139/92 | HR 94 | Temp 98.0°F | Resp 19 | Ht 63.0 in | Wt 124.4 lb

## 2013-06-14 DIAGNOSIS — D3A098 Benign carcinoid tumors of other sites: Secondary | ICD-10-CM

## 2013-06-14 DIAGNOSIS — D490 Neoplasm of unspecified behavior of digestive system: Secondary | ICD-10-CM

## 2013-06-14 NOTE — Progress Notes (Signed)
   [facility]    OFFICE PROGRESS NOTE   INTERVAL HISTORY:   She returns as scheduled. She is completing a first cycle of sunitinib. No mouth sores, skin rash, or increased diarrhea while on sunitinib. She has noted recent "flushing" episodes.  Ms. Barcus has noted increased nausea and vomiting over the past 5 days. She has vomited 4 times already today. She had an elevated BUN and creatinine last week with hypokalemia. The potassium is been repleted in the home TNA and her IV fluids were increased. She continues to have nausea and vomiting.  Objective:  Vital signs in last 24 hours:  Blood pressure 139/92, pulse 94, temperature 98 F (36.7 C), temperature source Oral, resp. rate 19, height 5\' 3"  (1.6 m), weight 124 lb 6.4 oz (56.427 kg).    HEENT: No thrush or ulcers Resp: Lungs clear bilaterally Cardio: Regular rate and rhythm GI: No hepatomegaly, liquid stool in the left abdomen ostomy bag, firmness in the right mid abdomen Vascular: No leg edema   Lab Results:    Medications: I have reviewed the patient's current medications.  Assessment/Plan: 1.Metastatic low grade carcinoid tumor, s/p 6 cycles of xeloda/temozolamide beginnning 09/06/2010.  -The chromagranin A and 24 hour Urine 5-HIAA were not significantly changed on 07/26/2011.  -Restaging CT 06/26/2012 with overall stable disease compared to a CT from June of 2011  -She began a trial of sunitinib on approximately 05/22/2013 2. Admission with SBO/SB infarction 06/08/2010, s/p resection of small bowel and colon.  3. S/p sigmoid colectomy/colostomy, right colectomy, and gastrojejunostomy in April 2009.  4. Elevated pre-operative 24-hour urine 5-HIAA.  5. Acute N/V April 2011, likely related to a partial small bowel obstruction due to tumor. Symptoms resolved with bowel rest and a liquid diet.  6. Intermittent loose stools-? Secondary to bowel resection or carcinoid. Partially improved with Sandostatin  LAR. She is no longer on Sandostatin.  7. Intermittent episodes of nausea and right buttock pain, resolving spontaneously-? related to carcinomatosis.  8. Right scalp herpes zoster rash July 2013  10. Admission with a small bowel obstruction 12/19/2012-status post NG tube decompression/bowel rest, CT the abdomen on 12/18/2012 revealed dilated small bowel loops and stomach, mild progression of capsular liver lesions, and no significant change in the pelvic masses . She continues to have intermittent obstructive symptoms. The nausea initially improved with low-dose prednisone, but she has developed progressive nausea with intermittent emesis.  11. Elevated BUN and creatinine 01/26/2013-likely secondary to dehydration, a renal ultrasound on 01/24/2013 negative for hydronephrosis, the BUN/creatinine were improved after receiving intravenous fluids.  12. Port-A-Cath placement 02/06/2013.  13. Nutrition. She is maintained on TPN.  14. Persistent nausea and vomiting-likely secondary to carcinomatosis with intermittent bowel obstruction or a direct nausea affect from the cancer. 15. Hypokalemia secondary to diarrhea and emesis-the potassium is been repleted in the home TPN    Disposition:  Ms. Eisenbeis is completing a first cycle of sunitinib. She appears to be tolerating the sunitinib well. However she has developed increased nausea and vomiting over the past week. The vomiting is likely secondary to carcinomatosis. Her nausea has been difficult to control with medical therapy. She is now interested in placement of a palliative gastrostomy. I will review options with Dr. Abbey Chatters.  She will return for an office visit on 07/04/2013. Her chemistry panel and CBC continue to [facility].   Thornton Papas, MD  06/14/2013  6:45 PM

## 2013-06-14 NOTE — Patient Instructions (Addendum)
Gastrostomy Tube, Adult  A gastrostomy tube is a tube that is placed into the stomach. It is also called a "G-tube." This tube is used for:   Feeding.   Giving medication.  CLEANING THE G-TUBE SITE   Wash your hands with soap and water.   Remove the old dressing (if any). Some styles of G-tubes may need a dressing inserted between the skin and the G-tube. Other types of G-tubes do not require a dressing. Ask your caregiver if a dressing is needed.   Check the area where the tube enters the skin (insertion site) for redness, swelling, or pus-like (purulent) drainage. A small amount of clear or tan liquid drainage is normal. Check to make sure scar tissue (skin) is not growing around the insertion site. This could have a raised, bumpy appearance.   A cotton swab can be used to clean the skin around the tube:   When the G-tube is first put in, a normal saline solution or water can be used to clean the skin.   Mild soap and warm water can be used when the skin around the G-tube site has healed.   Roll the cotton swab around the G-tube insertion site to remove any drainage or crusting at the insertion site.  RESIDUALS  Feeding tube residuals are the amount of liquids that are in the stomach at any given time. Residuals may be checked before giving feedings, medications, or as instructed by your caregiver.   Ask your caregiver if there are instances when you would not start tube feedings depending on the amount or type of contents withdrawn from the stomach.   Check residuals by attaching a syringe to the G-tube and pull back on the syringe plunger. Note the amount and return the residual back into the stomach.  FLUSHING THE G-TUBE   The G-tube should be periodically flushed with clean warm water to keep it from clogging.   Flush the G-tube after feedings or medications. Draw up 30 mLs of warm water in a syringe. Connect the syringe to the G-tube and slowly push the water into the tube.   Do not push  feedings, medications, or flushes rapidly. Flush the G-tube gently and slowly.   Only use syringes made for G-tubes to flush medications or feedings.   Your caregiver may want the G-tube flushed more often or with more water. If this is the case, follow your caregiver's instructions.  FEEDINGS  Your caregiver will determine whether feedings are given as a bolus (a certain amount given at one time and at scheduled times) or whether feedings will be given continuously on a feeding pump.    Formulas should be given at room temperature.   If feedings are continuous, no more than 4 hours worth of feedings should be placed in the feeding bag. This helps prevent spoilage or accidental excess infusion.   Cover and place unused formula in the refrigerator.   If feedings are continuous, stop the feedings when medications or flushes are given. Be sure to restart the feedings.   Feeding bags and syringes should be replaced as instructed by your caregiver.  GIVING MEDICATION    In general, it is best if all medications are in a liquid form for G-tube administration. Liquid medications are less likely to clog the G-tube.   Mix the liquid medication with 30 mLs (or amount recommended by your caregiver) of warm water.   Draw up the medication into the syringe.   Attach the syringe to   the G-tube and slowly push the mixture into the G-tube.   After giving the medication, draw up 30 mLs of warm water in the syringe and slowly flush the G-tube.   For pills or capsules, check with your caregiver first before crushing medications. Some pills are not effective if they are crushed. Some capsules are sustained release medications.   If appropriate, crush the pill or capsule and mix with 30 mLs of warm water. Using the syringe, slowly push the medication through the tube, then flush the tube with another 30 mLs of tap water.  G-TUBE PROBLEMS  G-tube was pulled out.   Cause: May have been pulled out accidentally.   Solutions:  Cover the opening with clean dressing and tape. Call your caregiver right away. The G-tube should be put in as soon as possible (within 4 hours) so the G-tube opening (tract) does not close. The G-tube needs to be put in at a healthcare setting. An X-ray needs to be done to confirm placement before the G-tube can be used again.  Redness, irritation, soreness, or foul odor around the gastrostomy site.   Cause: May be caused by leakage or infection.   Solutions: Call your caregiver right away.  Large amount of leakage of fluid or mucus-like liquid present (a large amount means it soaks clothing).   Cause: Many reasons could cause the G-tube to leak.   Solutions: Call your caregiver to discuss the amount of leakage.  Skin or scar tissue appears to be growing where tube enters skin.    Cause: Tissue growth may develop around the insertion site if the G-tube is moved or pulled on excessively.   Solutions: Secure tube with tape so that excess movement does not occur. Call your caregiver.  G-tube is clogged.   Cause: Thick formula or medication.   Solutions: Try to slowly push warm water into the tube with a large syringe. Never try to push any object into the tube to unclog it. Do not force fluid into the G-tube. If you are unable to unclog the tube, call your caregiver right away.  TIPS   Head of Bed (HOB) position refers to the upright position of a person's upper body.   When giving medications or a feeding bolus, keep the HOB up as told by your caregiver. Do this during the feeding and for 1 hour after the feeding or medication administration.   If continuous feedings are being given, it is best to keep the HOB up as told by your caregiver. When ADLs (Activities of Daily Living) are performed and the HOB needs to be flat, be sure to turn the feeding pump off. Restart the feeding pump when the HOB is returned to the recommended height.   Do not pull or put tension on the tube.   To prevent fluid backflow,  kink the G-tube before removing the cap or disconnecting a syringe.   Check the G-tube length every day. Measure from the insertion site to the end of the G-tube. If the length is longer than previous measurements, the tube may be coming out. Call your caregiver if you notice increasing G-tube length.   Oral care, such as brushing teeth, must be continued.   You may need to remove excess air (vent) from the G-tube. Your caregiver will tell you if this is needed.   Always call your caregiver if you have questions or problems with the G-tube.  SEEK IMMEDIATE MEDICAL CARE IF:    You have severe   abdominal pain, tenderness, or abdominal bloating(distension).   You have nausea or vomiting.   You are constipated or have problems moving your bowels.   The G-tube insertion site is red, swollen, has a foul smell, or has yellow or brown drainage.   You have difficulty breathing or shortness of breath.   You have a fever.   You have a large amount of feeding tube residuals.   The G-tube is clogged and cannot be flushed.  MAKE SURE YOU:    Understand these instructions.   Will watch your condition.   Will get help right away if you are not doing well or get worse.  Document Released: 02/14/2002 Document Revised: 02/28/2012 Document Reviewed: 04/02/2008  Exit[employer] Patient Information 2014 [employer].

## 2013-06-18 ENCOUNTER — Other Ambulatory Visit: Payer: Self-pay | Admitting: *Deleted

## 2013-06-18 ENCOUNTER — Other Ambulatory Visit: Payer: Self-pay | Admitting: Oncology

## 2013-06-18 ENCOUNTER — Telehealth: Payer: Self-pay | Admitting: *Deleted

## 2013-06-18 DIAGNOSIS — D3A098 Benign carcinoid tumors of other sites: Secondary | ICD-10-CM

## 2013-06-18 NOTE — Telephone Encounter (Signed)
Spoke with pt's daughter. Dr. Truett Perna discussed case with Dr. Randa Evens. Pt will need CT with oral contrast. Dr. Randa Evens' office will contact pt with appt. Daughter voiced understanding. Pt is having difficulty tolerating POs. They will try taking antiemetic prior to drinking contrast.

## 2013-06-18 NOTE — Telephone Encounter (Signed)
THIS REFILL REQUEST FOR SUTENT WAS GIVEN TO DR.SHERRILL'S NURSE, TANYA WHITLOCK,RN.

## 2013-06-20 ENCOUNTER — Ambulatory Visit (HOSPITAL_COMMUNITY)
Admission: RE | Admit: 2013-06-20 | Discharge: 2013-06-20 | Disposition: A | Discharge status: Home / Self Care | Payer: Medicare Other | Source: Ambulatory Visit | Attending: Oncology | Admitting: Oncology

## 2013-06-20 ENCOUNTER — Encounter: Payer: Self-pay | Admitting: Oncology

## 2013-06-20 ENCOUNTER — Telehealth: Payer: Self-pay | Admitting: *Deleted

## 2013-06-20 DIAGNOSIS — Z9049 Acquired absence of other specified parts of digestive tract: Secondary | ICD-10-CM | POA: Insufficient documentation

## 2013-06-20 DIAGNOSIS — N281 Cyst of kidney, acquired: Secondary | ICD-10-CM | POA: Insufficient documentation

## 2013-06-20 DIAGNOSIS — D490 Neoplasm of unspecified behavior of digestive system: Secondary | ICD-10-CM | POA: Insufficient documentation

## 2013-06-20 DIAGNOSIS — Z933 Colostomy status: Secondary | ICD-10-CM | POA: Insufficient documentation

## 2013-06-20 DIAGNOSIS — R1909 Other intra-abdominal and pelvic swelling, mass and lump: Secondary | ICD-10-CM | POA: Insufficient documentation

## 2013-06-20 DIAGNOSIS — Z9884 Bariatric surgery status: Secondary | ICD-10-CM | POA: Insufficient documentation

## 2013-06-20 DIAGNOSIS — D3A098 Benign carcinoid tumors of other sites: Secondary | ICD-10-CM

## 2013-06-20 DIAGNOSIS — N200 Calculus of kidney: Secondary | ICD-10-CM | POA: Insufficient documentation

## 2013-06-20 DIAGNOSIS — Z79899 Other long term (current) drug therapy: Secondary | ICD-10-CM | POA: Insufficient documentation

## 2013-06-20 DIAGNOSIS — Z9089 Acquired absence of other organs: Secondary | ICD-10-CM | POA: Insufficient documentation

## 2013-06-20 DIAGNOSIS — K7689 Other specified diseases of liver: Secondary | ICD-10-CM | POA: Insufficient documentation

## 2013-06-20 DIAGNOSIS — Z90710 Acquired absence of both cervix and uterus: Secondary | ICD-10-CM | POA: Insufficient documentation

## 2013-06-20 MED ORDER — IOHEXOL 300 MG/ML  SOLN
50.0000 mL | Freq: Once | INTRAMUSCULAR | Status: AC | PRN
  Administered 2013-06-20: 50 mL via ORAL

## 2013-06-20 NOTE — Telephone Encounter (Signed)
Message from pt's daughter reporting pt has vomited 10x today. Taking Phenergan with no relief. Has not taken Zofran or Lorazepam. Stated they weren't effective in the past. Suggested she try taking Zofran Q 8 hours for now in addition to Phenergan. She voiced understanding. Pt completed Sutent cycle on 06/19/13. Daughter states pt is scheduled to see Dr. Randa Evens 7/10. Lyla Son is asking if it is appropriate to get hospice involved. Will forward message to MD.

## 2013-06-22 ENCOUNTER — Encounter (HOSPITAL_COMMUNITY): Payer: Self-pay | Admitting: *Deleted

## 2013-06-22 ENCOUNTER — Emergency Department (HOSPITAL_COMMUNITY)
Admission: EM | Admit: 2013-06-22 | Discharge: 2013-06-22 | Disposition: A | Discharge status: Home / Self Care | Payer: Medicare Other | Attending: Emergency Medicine | Admitting: Emergency Medicine

## 2013-06-22 ENCOUNTER — Other Ambulatory Visit: Payer: Self-pay | Admitting: Hematology & Oncology

## 2013-06-22 ENCOUNTER — Other Ambulatory Visit: Payer: Self-pay

## 2013-06-22 DIAGNOSIS — I129 Hypertensive chronic kidney disease with stage 1 through stage 4 chronic kidney disease, or unspecified chronic kidney disease: Secondary | ICD-10-CM | POA: Insufficient documentation

## 2013-06-22 DIAGNOSIS — C801 Malignant (primary) neoplasm, unspecified: Secondary | ICD-10-CM | POA: Insufficient documentation

## 2013-06-22 DIAGNOSIS — N289 Disorder of kidney and ureter, unspecified: Secondary | ICD-10-CM | POA: Insufficient documentation

## 2013-06-22 DIAGNOSIS — N189 Chronic kidney disease, unspecified: Secondary | ICD-10-CM | POA: Insufficient documentation

## 2013-06-22 DIAGNOSIS — Z79899 Other long term (current) drug therapy: Secondary | ICD-10-CM | POA: Insufficient documentation

## 2013-06-22 LAB — CBC WITH DIFFERENTIAL/PLATELET
Basophils Absolute: 0 10*3/uL (ref 0.0–0.1)
Eosinophils Absolute: 0 10*3/uL (ref 0.0–0.7)
Lymphocytes Relative: 18 % (ref 12–46)
Lymphs Abs: 1.7 10*3/uL (ref 0.7–4.0)
Neutrophils Relative %: 79 % — ABNORMAL HIGH (ref 43–77)
Platelets: 277 10*3/uL (ref 150–400)
RBC: 4.71 MIL/uL (ref 3.87–5.11)
WBC: 9.4 10*3/uL (ref 4.0–10.5)

## 2013-06-22 LAB — COMPREHENSIVE METABOLIC PANEL
ALT: 35 U/L (ref 0–35)
AST: 25 U/L (ref 0–37)
Alkaline Phosphatase: 171 U/L — ABNORMAL HIGH (ref 39–117)
CO2: 25 mEq/L (ref 19–32)
GFR calc Af Amer: 43 mL/min — ABNORMAL LOW (ref >90)
Glucose, Bld: 102 mg/dL — ABNORMAL HIGH (ref 70–99)
Potassium: 5.1 mEq/L (ref 3.5–5.1)
Sodium: 135 mEq/L (ref 135–145)
Total Protein: 8.1 g/dL (ref 6.0–8.3)

## 2013-06-22 MED ORDER — HEPARIN SOD (PORK) LOCK FLUSH 100 UNIT/ML IV SOLN
500.0000 [IU] | Freq: Once | INTRAVENOUS | Status: AC
  Administered 2013-06-22: 500 [IU] via INTRAVENOUS
  Filled 2013-06-22: qty 5

## 2013-06-22 MED ORDER — ONDANSETRON HCL 4 MG/2ML IJ SOLN
4.0000 mg | Freq: Once | INTRAMUSCULAR | Status: AC
  Administered 2013-06-22: 4 mg via INTRAVENOUS
  Filled 2013-06-22: qty 2

## 2013-06-22 MED ORDER — SODIUM CHLORIDE 0.9 % IV BOLUS (SEPSIS)
500.0000 mL | Freq: Once | INTRAVENOUS | Status: AC
  Administered 2013-06-22: 500 mL via INTRAVENOUS

## 2013-06-22 NOTE — ED Notes (Signed)
[id number]<AV> Expected date:<BR> Expected time:<BR> Means of arrival:<BR> Comments:<BR>

## 2013-06-22 NOTE — Progress Notes (Signed)
[facility]  Patient Status: Active (receiving services up to time of hospitalization)  [facility] is providing the following services: RN and Home Infusion Services (teaching and education will be done by nurse in the home with patient and caregiver)    Pt is currently on TPN.   If patient discharges after hours, please call [phone].   Lori Dennis 06/22/2013, 4:32 PM

## 2013-06-22 NOTE — ED Provider Notes (Signed)
History    CSN: [id number] Arrival date & time 06/22/13  1612  First MD Initiated Contact with Patient 06/22/13 1635     Chief Complaint  Patient presents with  . high potassium 6.5, cancer pt    (Consider location/radiation/quality/duration/timing/severity/associated sxs/prior Treatment) HPI  Lori Dennis is a 67 year old lady with a history of metastatic carcinoid tumor who has been on chemotherapy and is on home health care receiving IV fluids who presents today with reports that her potassium is elevated. She has had problems with hypokalemia and has been getting supplemental potassium in her IV feeds. She has home health care and had labs drawn and was told that her potassium was elevated at 6.5 and her kidney function was poor and she needed to come to the ED. She has had nausea and vomiting but has last vomited at 3 AM. She is taking anti-medics and receiving IV fluids at home for this. She denies any change in ostomy output. She has not had any increased lightheadedness, chest pain, or dyspnea. Past Medical History  Diagnosis Date  . Complication of anesthesia     slow to wake up  . Blood transfusion without reported diagnosis 2009 and 2011  . Hypertension     off all bp meds since Dec 20, 2012  . Chronic kidney disease 12-19-2012    elevated bun and creatinine   . Cancer 02/19/2008    Metastatic carcinoid tumor   Past Surgical History  Procedure Laterality Date  . Bowel resection  06/08/2010    ileocolonic resection, enteroenterostomy  . Left colectomy  04/02/2008    Exploratory laparotomy, biopsy of omental nodule, sigmoid colectomy  . Vaginal hysterectomy      at age 71  . Cholecystectomy      age 43  . Portacath placement Right 02/06/2013    Procedure: Ultrasound guided PORT-A-CATH INSERTION with flouro;  Surgeon: Adolph Pollack, MD;  Location: [facility];  Service: General;  Laterality: Right;   Family History  Problem Relation Age of Onset  . Heart disease Mother    . Cancer Sister     ovarian   History  Substance Use Topics  . Smoking status: Never Smoker   . Smokeless tobacco: Never Used  . Alcohol Use: No   OB History   Grav Para Term Preterm Abortions TAB SAB Ect Mult Living                 Review of Systems  All other systems reviewed and are negative.    Allergies  Review of patient's allergies indicates no known allergies.  Home Medications   Current Outpatient Rx  Name  Route  Sig  Dispense  Refill  . antiseptic oral rinse (BIOTENE) LIQD   Mouth Rinse   15 mLs by Mouth Rinse route as needed (for dry mouth).         . dronabinol (MARINOL) 2.5 MG capsule      TAKE ONE CAPSULE BY MOUTH TWICE A DAY   60 capsule   1     PT REQUESTS REFILLS. THANK YOU!!!   . lidocaine-prilocaine (EMLA) cream   Topical   Apply topically as needed. Apply to [facility] site 1 hour prior to stick and cover with plastic wrap to numb site   30 g   prn   . LORazepam (ATIVAN) 0.5 MG tablet   Oral   Take 1 tablet (0.5 mg total) by mouth every 6 (six) hours as needed for anxiety.  30 tablet   1   . menthol-cetylpyridinium (CEPACOL) 3 MG lozenge   Oral   Take 1 lozenge by mouth as needed for pain.         Marland Kitchen ondansetron (ZOFRAN) 8 MG tablet   Oral   Take 1 tablet (8 mg total) by mouth 2 (two) times daily. To control nausea   60 tablet   1   . promethazine (PHENERGAN) 12.5 MG suppository   Rectal   Place 1 suppository (12.5 mg total) rectally every 6 (six) hours as needed for nausea (if unable to tolerate po).   30 each   1   . promethazine (PHENERGAN) 12.5 MG tablet      Take 1/2 - 1 tablet PO Q6h prn nausea   30 tablet   1   . SUNItinib (SUTENT) 25 MG capsule   Oral   Take 1 capsule (25 mg total) by mouth daily. For 28 days   28 capsule   0     Script sent to [facility] 05/17/13   . TPN ADULT   Intravenous   Inject into the vein continuous. 14 hours/day start between 2000-2100          BP 130/91  Pulse  102  Temp(Src) 98.7 F (37.1 C) (Oral)  Resp 18  SpO2 97% Physical Exam  Nursing note and vitals reviewed. Constitutional: She is oriented to person, place, and time. She appears well-developed and well-nourished.  HENT:  Head: Normocephalic and atraumatic.  Right Ear: External ear normal.  Left Ear: External ear normal.  Nose: Nose normal.  Mouth/Throat: Oropharynx is clear and moist.  Eyes: Conjunctivae and EOM are normal. Pupils are equal, round, and reactive to light.  Neck: Normal range of motion. Neck supple.  Cardiovascular: Normal rate, regular rhythm, normal heart sounds and intact distal pulses.   Pulmonary/Chest: Effort normal and breath sounds normal.  Right chest wall catheter in place.  Abdominal: Soft. Bowel sounds are normal.  Musculoskeletal: Normal range of motion.  Neurological: She is alert and oriented to person, place, and time. She has normal reflexes.  Skin: Skin is warm and dry.  Psychiatric: She has a normal mood and affect. Her behavior is normal. Thought content normal.    ED Course  Procedures (including critical care time) Labs Reviewed  CBC WITH DIFFERENTIAL - Abnormal; Notable for the following:    Neutrophils Relative % 79 (*)    All other components within normal limits  COMPREHENSIVE METABOLIC PANEL - Abnormal; Notable for the following:    Glucose, Bld 102 (*)    BUN 104 (*)    Creatinine, Ser 1.43 (*)    Albumin 3.1 (*)    Alkaline Phosphatase 171 (*)    GFR calc non Af Amer 37 (*)    GFR calc Af Amer 43 (*)    All other components within normal limits   No results found. No diagnosis found. Results for orders placed during the hospital encounter of 06/22/13  CBC WITH DIFFERENTIAL      Result Value Range   WBC 9.4  4.0 - 10.5 K/uL   RBC 4.71  3.87 - 5.11 MIL/uL   Hemoglobin 14.6  12.0 - 15.0 g/dL   HCT 16.1  09.6 - 04.5 %   MCV 88.1  78.0 - 100.0 fL   MCH 31.0  26.0 - 34.0 pg   MCHC 35.2  30.0 - 36.0 g/dL   RDW 40.9  81.1 -  91.4 %  Platelets 277  150 - 400 K/uL   Neutrophils Relative % 79 (*) 43 - 77 %   Neutro Abs 7.4  1.7 - 7.7 K/uL   Lymphocytes Relative 18  12 - 46 %   Lymphs Abs 1.7  0.7 - 4.0 K/uL   Monocytes Relative 3  3 - 12 %   Monocytes Absolute 0.3  0.1 - 1.0 K/uL   Eosinophils Relative 0  0 - 5 %   Eosinophils Absolute 0.0  0.0 - 0.7 K/uL   Basophils Relative 0  0 - 1 %   Basophils Absolute 0.0  0.0 - 0.1 K/uL  COMPREHENSIVE METABOLIC PANEL      Result Value Range   Sodium 135  135 - 145 mEq/L   Potassium 5.1  3.5 - 5.1 mEq/L   Chloride 101  96 - 112 mEq/L   CO2 25  19 - 32 mEq/L   Glucose, Bld 102 (*) 70 - 99 mg/dL   BUN 409 (*) 6 - 23 mg/dL   Creatinine, Ser 8.11 (*) 0.50 - 1.10 mg/dL   Calcium 9.9  8.4 - 91.4 mg/dL   Total Protein 8.1  6.0 - 8.3 g/dL   Albumin 3.1 (*) 3.5 - 5.2 g/dL   AST 25  0 - 37 U/L   ALT 35  0 - 35 U/L   Alkaline Phosphatase 171 (*) 39 - 117 U/L   Total Bilirubin 0.5  0.3 - 1.2 mg/dL   GFR calc non Af Amer 37 (*) >90 mL/min   GFR calc Af Amer 43 (*) >90 mL/min   Date: 06/22/2013   Date: 06/22/2013  Rate: 88  Rhythm: normal sinus rhythm  QRS Axis: normal  Intervals: normal  ST/T Wave abnormalities: normal  Conduction Disutrbances: none  Narrative Interpretation: unremarkable    . Patient with potassium day at 5.1 which is within our normal limits but at the high end. Her EKG shows no evidence of hyperchromic anemia and she has been hemodynamically stable here. Her creatinine is 1.43. She is advised to not have the potassium supplements over the weekend. She will change her feeds to non-added potassium. She'll have her potassium rechecked on Monday. I have discussed this with the patient and her daughter and they voiced agreement and voiced understanding of return precautions. MDM  Patient with diabetic home known carcinoid metastatic disease to bed with reports of elevated potassium. Potassium is normal here. She is discharged home in good  condition.  Hilario Quarry, MD 06/22/13 [phone]

## 2013-06-22 NOTE — ED Notes (Signed)
Pt and pt family report pt is being treated for carcinoid cancer, takes PO chemo, just finished this months round of chemo. Reports vomiting every day for past 2 weeks. pcp knows and last week had CT. Pt gets nutrition through port. Pt had blood drawn this morning, potassium 6.5.  Abdominal pain 7/10, chronic abdominal pain. Pt has colostomy.

## 2013-06-25 ENCOUNTER — Other Ambulatory Visit: Payer: Self-pay | Admitting: Gastroenterology

## 2013-06-25 ENCOUNTER — Telehealth: Payer: Self-pay | Admitting: Dietician

## 2013-06-25 NOTE — Addendum Note (Signed)
Addended by: Samuel Germany. on: 06/25/2013 11:40 AM   Modules accepted: Orders

## 2013-06-26 ENCOUNTER — Telehealth: Payer: Self-pay | Admitting: *Deleted

## 2013-06-26 NOTE — Telephone Encounter (Signed)
MD review of labs faxed by [facility]-patient needs more hydration. Called [facility] and spoke with Barbie Banner will be sure additional hydration fluids are provided to home.

## 2013-06-28 ENCOUNTER — Other Ambulatory Visit: Payer: Self-pay | Admitting: Oncology

## 2013-06-28 ENCOUNTER — Encounter (HOSPITAL_COMMUNITY): Payer: Self-pay

## 2013-06-28 ENCOUNTER — Ambulatory Visit (HOSPITAL_COMMUNITY)
Admission: RE | Admit: 2013-06-28 | Discharge: 2013-06-28 | Disposition: A | Discharge status: Home / Self Care | Payer: Medicare Other | Source: Ambulatory Visit | Attending: Gastroenterology | Admitting: Gastroenterology

## 2013-06-28 ENCOUNTER — Encounter (HOSPITAL_COMMUNITY)
Admission: RE | Disposition: A | Discharge status: Home / Self Care | Payer: Self-pay | Source: Ambulatory Visit | Attending: Gastroenterology

## 2013-06-28 DIAGNOSIS — K56609 Unspecified intestinal obstruction, unspecified as to partial versus complete obstruction: Secondary | ICD-10-CM | POA: Insufficient documentation

## 2013-06-28 DIAGNOSIS — Z932 Ileostomy status: Secondary | ICD-10-CM | POA: Insufficient documentation

## 2013-06-28 DIAGNOSIS — Z903 Acquired absence of stomach [part of]: Secondary | ICD-10-CM | POA: Insufficient documentation

## 2013-06-28 DIAGNOSIS — D3A098 Benign carcinoid tumors of other sites: Secondary | ICD-10-CM

## 2013-06-28 DIAGNOSIS — I10 Essential (primary) hypertension: Secondary | ICD-10-CM | POA: Insufficient documentation

## 2013-06-28 DIAGNOSIS — C7A029 Malignant carcinoid tumor of the large intestine, unspecified portion: Secondary | ICD-10-CM | POA: Insufficient documentation

## 2013-06-28 DIAGNOSIS — Z79899 Other long term (current) drug therapy: Secondary | ICD-10-CM | POA: Insufficient documentation

## 2013-06-28 DIAGNOSIS — K208 Other esophagitis without bleeding: Secondary | ICD-10-CM | POA: Insufficient documentation

## 2013-06-28 DIAGNOSIS — R112 Nausea with vomiting, unspecified: Secondary | ICD-10-CM | POA: Insufficient documentation

## 2013-06-28 HISTORY — PX: ESOPHAGOGASTRODUODENOSCOPY: SHX5428

## 2013-06-28 SURGERY — EGD (ESOPHAGOGASTRODUODENOSCOPY)
Anesthesia: Moderate Sedation

## 2013-06-28 MED ORDER — FENTANYL CITRATE 0.05 MG/ML IJ SOLN
INTRAMUSCULAR | Status: AC
  Filled 2013-06-28: qty 4

## 2013-06-28 MED ORDER — FENTANYL CITRATE 0.05 MG/ML IJ SOLN
INTRAMUSCULAR | Status: DC | PRN
  Administered 2013-06-28 (×2): 25 ug via INTRAVENOUS

## 2013-06-28 MED ORDER — MIDAZOLAM HCL 10 MG/2ML IJ SOLN
INTRAMUSCULAR | Status: DC | PRN
  Administered 2013-06-28: 1 mg via INTRAVENOUS
  Administered 2013-06-28: 2 mg via INTRAVENOUS
  Administered 2013-06-28: 1 mg via INTRAVENOUS

## 2013-06-28 MED ORDER — MIDAZOLAM HCL 10 MG/2ML IJ SOLN
INTRAMUSCULAR | Status: AC
  Filled 2013-06-28: qty 2

## 2013-06-28 MED ORDER — SODIUM CHLORIDE 0.9 % IV SOLN
INTRAVENOUS | Status: DC
  Administered 2013-06-28: 500 mL via INTRAVENOUS

## 2013-06-28 MED ORDER — BUTAMBEN-TETRACAINE-BENZOCAINE 2-2-14 % EX AERO
INHALATION_SPRAY | CUTANEOUS | Status: DC | PRN
  Administered 2013-06-28: 1 via TOPICAL

## 2013-06-28 NOTE — Op Note (Addendum)
[facility] [address] [place], [address]   ENDOSCOPY PROCEDURE REPORT  PATIENT: Lori, Dennis  MR#: [id number] BIRTHDATE: [birth year] , 66  yrs. old GENDER: Female ENDOSCOPIST:Brynlyn Dade Randa Evens, MD REFERRED BY:  Dr. Truett Perna PROCEDURE DATE:  06/28/2013 PROCEDURE:   EGD ASA CLASS:    class IV INDICATIONS:  patient with history of carcinoid. She has undergone multiple procedures including resection portion of her small bowel in: as well as lysis of adhesions.. She has had progressive nausea and vomiting. CT scan shows dilated proximal small bowel. Patient has had partial gastrectomy with previous gastrojejunostomy MEDICATION: fentanyl 50 mcg, versed 4 mg IV TOPICAL ANESTHETIC:    cetacaine spray  DESCRIPTION OF PROCEDURE:   Theprocedure had been explained to the patient and consider obtain. In the left lateral  position the Pentax endoscope was inserted blindly into the esophagus and was advanced under direct visualization. There was fairly marked ulcerative esophagitis and the distal esophagus. The stomach was entered in approximately 1000 mL of billous material was suctioned. The gastrojejunostomy was widely patent and both limbs were entered. There was a large amount of bilious material in both limbs. The efferent limb was somewhat dilated . There was no sign of mucosal disease and no sign of recurrent carcinoid. The patient tolerated procedure well in the scope was withdrawn     COMPLICATIONS: None  ENDOSCOPIC IMPRESSION: 1. Nausea and vomiting. Based on EGD and CT results probably result of partial or complete SBO of the small bowel. No sign of recurrent carcinoid 2. Ulcerative esophagitis. This is almost certainly secondary to vomiting  RECOMMENDATIONS: 1.consider placement of PEG tube for palliation. 2. Empirically began PPI therapy for the alternative esophagitis. 3. Patient and family to discuss further treatment with  Dr. Truett Perna.    _______________________________ Rosalie Doctor:  Carman Ching, MD 06/28/2013 11:05 AM CC: Dr. Truett Perna   PATIENT NAME:  Lori, Dennis MR#: [id number]

## 2013-06-28 NOTE — H&P (Signed)
Subjective:   Patient is a 67 y.o. female presents with persistent nausea and bombing. She has carcinoid and has undergone previous: more sections and has a permanent ileostomy. She's been able to take in adequate amounts of nutrition has been on TNA. This is done to look for in upper G.I. source of her nausea and vomiting. . Procedure including risks and benefits discussed in office.  Patient Active Problem List   Diagnosis Date Noted  . SBO (small bowel obstruction) 12/23/2012  . HTN (hypertension) 12/19/2012  . Nausea & vomiting 12/19/2012  . Acute renal failure 12/19/2012  . Hypokalemia 12/19/2012  . Dehydration 12/19/2012  . 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 Complication of anesthesia     slow to wake up  . Blood transfusion without reported diagnosis 2009 and 2011  . Hypertension     off all bp meds since Dec 20, 2012  . Chronic kidney disease 12-19-2012    elevated bun and creatinine   . Cancer 02/19/2008    Metastatic carcinoid tumor    Past Surgical History  Procedure Laterality Date  . Bowel resection  06/08/2010    ileocolonic resection, enteroenterostomy  . Left colectomy  04/02/2008    Exploratory laparotomy, biopsy of omental nodule, sigmoid colectomy  . Vaginal hysterectomy      at age 53  . Cholecystectomy      age 1  . Portacath placement Right 02/06/2013    Procedure: Ultrasound guided PORT-A-CATH INSERTION with flouro;  Surgeon: Adolph Pollack, MD;  Location: [facility];  Service: General;  Laterality: Right;    Prescriptions prior to admission  Medication Sig Dispense Refill  . antiseptic oral rinse (BIOTENE) LIQD 15 mLs by Mouth Rinse route as needed (for dry mouth).      . dronabinol (MARINOL) 2.5 MG capsule TAKE ONE CAPSULE BY MOUTH TWICE A DAY  60 capsule  1  . LORazepam (ATIVAN) 0.5 MG tablet Take 1 tablet (0.5 mg total) by mouth every 6 (six) hours as needed for anxiety.  30 tablet  1  . ondansetron  (ZOFRAN) 8 MG tablet Take 1 tablet (8 mg total) by mouth 2 (two) times daily. To control nausea  60 tablet  1  . promethazine (PHENERGAN) 12.5 MG suppository Place 1 suppository (12.5 mg total) rectally every 6 (six) hours as needed for nausea (if unable to tolerate po).  30 each  1  . promethazine (PHENERGAN) 12.5 MG tablet Take 1/2 - 1 tablet PO Q6h prn nausea  30 tablet  1  . SUNItinib (SUTENT) 25 MG capsule Take 1 capsule (25 mg total) by mouth daily. For 28 days  28 capsule  0  . TPN ADULT Inject into the vein continuous. 14 hours/day start between 2000-2100      . lidocaine-prilocaine (EMLA) cream Apply topically as needed. Apply to [facility] site 1 hour prior to stick and cover with plastic wrap to numb site  30 g  prn  . menthol-cetylpyridinium (CEPACOL) 3 MG lozenge Take 1 lozenge by mouth as needed for pain.       No Known Allergies  History  Substance Use Topics  . Smoking status: Never Smoker   . Smokeless tobacco: Never Used  . Alcohol Use: No    Family History  Problem Relation Age of Onset  . Heart disease Mother   . Cancer Sister     ovarian     Objective:   Patient Vitals for  the past 8 hrs:  BP Temp Temp src Pulse Resp SpO2  06/28/13 1000 104/73 mmHg - - 93 15 99 %  06/28/13 0928 107/59 mmHg 97.6 F (36.4 C) Oral - 12 99 %         See MD Preop evaluation      Assessment:   1. Nausea and vomiting of unclear etiology 2. Intestinal carcinoid status posts multiple surgeries history of prior SBO presumably due to adhesions  Plan:   We'll go ahead and proceed with EGD at this time. Have discussed this with the patient and her family.

## 2013-06-29 ENCOUNTER — Other Ambulatory Visit: Payer: Self-pay | Admitting: Oncology

## 2013-06-29 ENCOUNTER — Encounter (HOSPITAL_COMMUNITY): Payer: Self-pay | Admitting: Gastroenterology

## 2013-06-29 ENCOUNTER — Other Ambulatory Visit: Payer: Self-pay | Admitting: *Deleted

## 2013-06-29 DIAGNOSIS — D490 Neoplasm of unspecified behavior of digestive system: Secondary | ICD-10-CM

## 2013-07-03 ENCOUNTER — Ambulatory Visit
Admission: RE | Admit: 2013-07-03 | Discharge: 2013-07-03 | Disposition: A | Discharge status: Home / Self Care | Payer: Medicare Other | Source: Ambulatory Visit | Attending: Oncology | Admitting: Oncology

## 2013-07-03 DIAGNOSIS — D490 Neoplasm of unspecified behavior of digestive system: Secondary | ICD-10-CM

## 2013-07-03 NOTE — Progress Notes (Unsigned)
Patient ID: Lori Dennis, female   DOB: Feb 20, 1946, 67 y.o.   MRN: [id number] July 03, 2013 G. Rolm Baptise, M.D.  [facility] [address] [place], [place]  [address] RE:  Lori Dennis (DOB: 1946-07-01) Dear Dr. Truett Perna: Thank you for the referral of your patient Lori Dennis for consultation regarding her needs for decompressive gastrostomy. As you know, this pleasant 67 year old female was diagnosed with metastatic carcinoid, treated by chemotherapy.  She has had decompressive ileostomy as well as gastric bypass surgery.  She is having progressive nausea with large volume vomiting daily.  She is interested in options for palliative treatment.   Her past medical history is significant for hypertension, renal insufficiency, hysterectomy and cholecystectomy.   She has an indwelling right chest port catheter.   Current medications include Biotene Oral Rinse, Marinol, Ativan, Cepacol, Zofran, Phenergan and Sutent.   She has no known drug allergies.  No known allergy to latex or iodinated contrast.  She was accompanied to the visit by her two daughters who are supportive in her care.   Family history is significant only for heart disease in her mother, ovarian cancer in her sister.   On exam, she is thin, of normal mood and affect, ambulates well independently.  She has a right chest port catheter which she uses for TNA and a left abdominal ileostomy.   Her recent CT abdomen showed changes of gastrojejunostomy with dilatation of the efferent limb.  Stomach is anterior with no significant intervening tumor to allow safe antegrade anterior percutaneous access.  There is distal duodenal obstructing mass.   We spent the majority of the 45 minute consultation discussing the indications for percutaneous gastric decompression.  We discussed the various options including endoscopic, surgical and radiologic gastrostomy tube placement.  We reviewed the techniques of the procedure, possible  risks, side effects and complications and expected post procedural course.  We discussed the logistics of using the tube as gastric decompression.  My impression is that she is an appropriate candidate for percutaneous gastric decompression with a gastrostomy catheter.  I think there is a high likelihood that this will relieve her nausea and certainly should relieve her high volume vomiting.  We did review that nausea can be secondary to other issues such as chemotherapy or tumor itself, such that a decompressive gastrostomy may not eliminate all sources of nausea but certainly that from her gastric and proximal small bowel distention should be relieved fairly immediately.  She seemed to understand, and she and her daughters did ask appropriate questions.  She will think about her options, and let us know if she would like to proceed.  We reviewed that this would be done as an outpatient procedure probably at [facility].   Again, I appreciate your referral of this very pleasant patient.  I will keep you updated with her progress. Sincerely, D. Oley Balm, M.D.  Lear Ng

## 2013-07-04 ENCOUNTER — Telehealth: Payer: Self-pay | Admitting: Dietician

## 2013-07-04 ENCOUNTER — Telehealth: Payer: Self-pay | Admitting: *Deleted

## 2013-07-04 ENCOUNTER — Telehealth: Payer: Self-pay | Admitting: Oncology

## 2013-07-04 ENCOUNTER — Ambulatory Visit (HOSPITAL_BASED_OUTPATIENT_CLINIC_OR_DEPARTMENT_OTHER): Payer: Medicare Other | Admitting: Oncology

## 2013-07-04 VITALS — BP 119/78 | HR 92 | Temp 97.5°F | Resp 20 | Ht 63.0 in | Wt 117.7 lb

## 2013-07-04 DIAGNOSIS — D490 Neoplasm of unspecified behavior of digestive system: Secondary | ICD-10-CM

## 2013-07-04 DIAGNOSIS — D3A098 Benign carcinoid tumors of other sites: Secondary | ICD-10-CM

## 2013-07-04 NOTE — Telephone Encounter (Signed)
Brief Outpatient Oncology Nutrition Note  Patient has been identified to be at risk on malnutrition screen.  Wt Readings from Last 10 Encounters:  07/04/13 117 lb 11.2 oz (53.388 kg)  06/14/13 124 lb 6.4 oz (56.427 kg)  05/17/13 128 lb 14.4 oz (58.469 kg)  04/19/13 131 lb 11.2 oz (59.739 kg)  03/22/13 135 lb 3.2 oz (61.326 kg)  02/22/13 136 lb 4.8 oz (61.825 kg)  02/05/13 133 lb 6.4 oz (60.51 kg)  01/19/13 136 lb 1.6 oz (61.735 kg)  01/15/13 138 lb 9.6 oz (62.869 kg)  01/11/13 137 lb 6.4 oz (62.324 kg)  12/19/12 148.75 lbs  Chart reviewed.  Patient on home tpn.  20% weight loss in the last 6 months and a a 5% weight loss in the last month.  Patient to see MD today.  Oran Rein, RD, LDN

## 2013-07-04 NOTE — Telephone Encounter (Signed)
gv and printed appt sched and avs forl pt. °

## 2013-07-04 NOTE — Progress Notes (Signed)
[facility]    OFFICE PROGRESS NOTE   INTERVAL HISTORY:   She returns as scheduled. She continues to have frequent episodes of emesis. She is weak. She continues home TNA. Lori Dennis reports intermittent leg cramping. She is drinking water. A CT of the abdomen and pelvis on 06/24/2013 revealed dilated loops of proximal small bowel. A partial small bowel obstruction was suspected. Peritoneal disease over the liver is unchanged. Decreased 6.5 cm right adnexal mass. No ascites.  Dr. Randa Evens performed an upper endoscopy on 06/28/2013. Ulcerative esophagitis was noted. The gastrojejunostomy was patent. A large amount of bilious material was noted in both limbs. No sign of mucosal disease. The efferent limb was dilated.  She saw Dr. Deanne Coffer on 07/03/2013. He feels placement of a percutaneous gastrostomy tube is possible.  Objective:  Vital signs in last 24 hours:  Blood pressure 119/78, pulse 92, temperature 97.5 F (36.4 C), temperature source Oral, resp. rate 20, height 5\' 3"  (1.6 m), weight 117 lb 11.2 oz (53.388 kg).    HEENT: No thrush Resp: Lungs clear bilaterally Cardio:  Regular rate and rhythm GI: No hepatomegaly, no apparent ascites, firmness in the right mid abdomen Vascular: The left lower leg is slightly larger than the right side. Diminished skin turgor   Portacath/PICC-stable erythema at the superior aspect of the Port-A-Cath  Lab Results:  Lab Results  Component Value Date   WBC 9.4 06/22/2013   HGB 14.6 06/22/2013   HCT 41.5 06/22/2013   MCV 88.1 06/22/2013   PLT 277 06/22/2013    07/03/2013-potassium 2.5, BUN 73, creatinine 1.02  Medications: I have reviewed the patient's current medications.  Assessment/Plan: 1.Metastatic low grade carcinoid tumor, s/p 6 cycles of xeloda/temozolamide beginnning 09/06/2010.  -The chromagranin A and 24 hour Urine 5-HIAA were not significantly changed on 07/26/2011.  -Restaging CT 06/26/2012 with overall stable  disease compared to a CT from June of 2011  -She began a trial of sunitinib on approximately 05/22/2013  2. Admission with SBO/SB infarction 06/08/2010, s/p resection of small bowel and colon.  3. S/p sigmoid colectomy/colostomy, right colectomy, and gastrojejunostomy in April 2009.  4. Elevated pre-operative 24-hour urine 5-HIAA.  5. Acute N/V April 2011, likely related to a partial small bowel obstruction due to tumor. Symptoms resolved with bowel rest and a liquid diet.  6. Intermittent loose stools-? Secondary to bowel resection or carcinoid. Partially improved with Sandostatin LAR. She is no longer on Sandostatin.  7. Intermittent episodes of nausea and right buttock pain, resolving spontaneously-? related to carcinomatosis.  8. Right scalp herpes zoster rash July 2013  10. Admission with a small bowel obstruction 12/19/2012-status post NG tube decompression/bowel rest, CT the abdomen on 12/18/2012 revealed dilated small bowel loops and stomach, mild progression of capsular liver lesions, and no significant change in the pelvic masses . She continues to have intermittent obstructive symptoms. The nausea initially improved with low-dose prednisone, but she has developed progressive nausea with intermittent emesis.  11. Elevated BUN and creatinine 01/26/2013-likely secondary to dehydration, a renal ultrasound on 01/24/2013 negative for hydronephrosis, the BUN/creatinine were improved after receiving intravenous fluids.  12. Port-A-Cath placement 02/06/2013.  13. Nutrition. She is maintained on TPN.  14. Persistent nausea and vomiting-likely secondary to carcinomatosis with intermittent bowel obstruction or a direct nausea affect from the cancer. -CT 06/24/2013 and upper endoscopy 06/28/2013 consistent with a proximal small bowel obstruction  15. Hypokalemia secondary to diarrhea and emesis-the potassium will be repleted in the home TPN  Disposition:  She continues to have frequent  nausea/vomiting. Her performance status is declining. I discussed options with Lori Dennis and her daughter. She agrees to placement of a palliative gastrostomy tube for relief of the nausea and vomiting. Hopefully this will allow her to regain some of her strength. She will continue home TNA and IV fluids. She will contact Dr. Deanne Coffer to arrange for placement of the gastrostomy tube.  We decided to place Sutent on hold. She is not a candidate for systemic therapy in her current condition.   Thornton Papas, MD  07/04/2013  1:10 PM

## 2013-07-04 NOTE — Telephone Encounter (Signed)
Called [facility] with an order to continue extra IVF with TNA, per Dr. Truett Perna.

## 2013-07-09 ENCOUNTER — Telehealth: Payer: Self-pay | Admitting: Medical Oncology

## 2013-07-09 NOTE — Telephone Encounter (Signed)
I called [facility] pharmacy and per Dr Truett Perna requested that they adjust her potassium dose. A pharmacist will adjust her dose . I faxed lab results to pharmacy

## 2013-07-12 ENCOUNTER — Other Ambulatory Visit: Payer: Self-pay | Admitting: Oncology

## 2013-07-17 ENCOUNTER — Encounter (HOSPITAL_COMMUNITY): Payer: Self-pay | Admitting: Pharmacy Technician

## 2013-07-18 ENCOUNTER — Telehealth: Payer: Self-pay | Admitting: *Deleted

## 2013-07-18 ENCOUNTER — Encounter: Payer: Self-pay | Admitting: Oncology

## 2013-07-18 NOTE — Telephone Encounter (Signed)
Daughter asking if MD wants to move her 8/4 appointment to later since her G-tube will not be placed until 07/24/13? She has been having headaches more frequently-located above her left eye. Suggested they keep appointment for 8/4, especially since she has been having more headaches. Suggested she keep headache diary and document when they occur and if she has any other neuro s/s during the headache. She understands and agrees.

## 2013-07-18 NOTE — Telephone Encounter (Signed)
Noted labs from [facility] w/elevated BUN/creatinine. Faxed to [facility].

## 2013-07-20 ENCOUNTER — Other Ambulatory Visit: Payer: Self-pay | Admitting: Radiology

## 2013-07-23 ENCOUNTER — Ambulatory Visit (HOSPITAL_BASED_OUTPATIENT_CLINIC_OR_DEPARTMENT_OTHER): Payer: Medicare Other | Admitting: Oncology

## 2013-07-23 ENCOUNTER — Encounter: Payer: Self-pay | Admitting: Oncology

## 2013-07-23 ENCOUNTER — Telehealth: Payer: Self-pay | Admitting: Oncology

## 2013-07-23 VITALS — BP 116/78 | HR 94 | Temp 98.5°F | Resp 18 | Ht 63.0 in | Wt 122.5 lb

## 2013-07-23 DIAGNOSIS — D3A098 Benign carcinoid tumors of other sites: Secondary | ICD-10-CM

## 2013-07-23 DIAGNOSIS — C7A019 Malignant carcinoid tumor of the small intestine, unspecified portion: Secondary | ICD-10-CM

## 2013-07-23 DIAGNOSIS — R109 Unspecified abdominal pain: Secondary | ICD-10-CM

## 2013-07-23 DIAGNOSIS — R112 Nausea with vomiting, unspecified: Secondary | ICD-10-CM

## 2013-07-23 DIAGNOSIS — E876 Hypokalemia: Secondary | ICD-10-CM

## 2013-07-23 DIAGNOSIS — R197 Diarrhea, unspecified: Secondary | ICD-10-CM

## 2013-07-23 MED ORDER — OXYCODONE HCL 20 MG/ML PO CONC: 5.0000 mg | ORAL | Status: DC | PRN

## 2013-07-23 NOTE — Telephone Encounter (Signed)
gv pt appt schedule for August.  °

## 2013-07-23 NOTE — Progress Notes (Signed)
   [facility]    OFFICE PROGRESS NOTE   INTERVAL HISTORY:   She returns as scheduled. The gastrostomy tube will be placed on a 07/24/2013. She continues to vomit multiple times each day. She is taking intravenous fluids in addition to TNA. She has noted a left-sided headache on the days when the vomiting is worst.  Objective:  Vital signs in last 24 hours:  Blood pressure 116/78, pulse 94, temperature 98.5 F (36.9 C), temperature source Oral, resp. rate 18, height 5\' 3"  (1.6 m), weight 122 lb 8 oz (55.566 kg).    HEENT: The mucous membranes are dry Resp: Lungs clear bilaterally Cardio: Regular rate and rhythm GI: Fullness in the right abdomen, the left lower ostomy bag is empty Vascular: No leg edema   Portacath/PICC-with mild erythema at the suture line   Medications: I have reviewed the patient's current medications.  Assessment/Plan: 1.Metastatic low grade carcinoid tumor, s/p 6 cycles of xeloda/temozolamide beginnning 09/06/2010.  -The chromagranin A and 24 hour Urine 5-HIAA were not significantly changed on 07/26/2011.  -Restaging CT 06/26/2012 with overall stable disease compared to a CT from June of 2011  -She began a trial of sunitinib on approximately 05/22/2013  2. Admission with SBO/SB infarction 06/08/2010, s/p resection of small bowel and colon.  3. S/p sigmoid colectomy/colostomy, right colectomy, and gastrojejunostomy in April 2009.  4. Elevated pre-operative 24-hour urine 5-HIAA.  5. Acute N/V April 2011, likely related to a partial small bowel obstruction due to tumor. Symptoms resolved with bowel rest and a liquid diet.  6. Intermittent loose stools-? Secondary to bowel resection or carcinoid. Partially improved with Sandostatin LAR. She is no longer on Sandostatin.  7. Intermittent episodes of nausea and right buttock pain, resolving spontaneously-? related to carcinomatosis.  8. Right scalp herpes zoster rash July 2013  10. Admission with a  small bowel obstruction 12/19/2012-status post NG tube decompression/bowel rest, CT the abdomen on 12/18/2012 revealed dilated small bowel loops and stomach, mild progression of capsular liver lesions, and no significant change in the pelvic masses . She continues to have intermittent obstructive symptoms. The nausea initially improved with low-dose prednisone, but she has developed progressive nausea with intermittent emesis.  11. Elevated BUN and creatinine 01/26/2013-likely secondary to dehydration, a renal ultrasound on 01/24/2013 negative for hydronephrosis, the BUN/creatinine were improved after receiving intravenous fluids.  12. Port-A-Cath placement 02/06/2013.  13. Nutrition. She is maintained on TPN.  14. Persistent nausea and vomiting-likely secondary to carcinomatosis with intermittent bowel obstruction or a direct nausea affect from the cancer.  -CT 06/24/2013 and upper endoscopy 06/28/2013 consistent with a proximal small bowel obstruction  15. Hypokalemia secondary to diarrhea and emesis-the potassium is been repleted in the home TNA  Disposition:  She is scheduled for placement of a palliative gastrostomy tube on 07/24/2013. She reports increased abdominal pain. She will try concentrated oxycodone elixir. Hopefully the gastrostomy tube will alleviate the emesis. She will continue TNA and home IV fluids. Ms. Vilar will return for an office visit in 3 weeks.   Thornton Papas, MD  07/23/2013  5:10 PM

## 2013-07-24 ENCOUNTER — Ambulatory Visit (HOSPITAL_COMMUNITY)
Admission: RE | Admit: 2013-07-24 | Discharge: 2013-07-24 | Disposition: A | Discharge status: Home / Self Care | Payer: Medicare Other | Source: Ambulatory Visit | Attending: Oncology | Admitting: Oncology

## 2013-07-24 ENCOUNTER — Encounter (HOSPITAL_COMMUNITY): Payer: Self-pay

## 2013-07-24 DIAGNOSIS — R1115 Cyclical vomiting syndrome unrelated to migraine: Secondary | ICD-10-CM | POA: Insufficient documentation

## 2013-07-24 DIAGNOSIS — D490 Neoplasm of unspecified behavior of digestive system: Secondary | ICD-10-CM

## 2013-07-24 DIAGNOSIS — Z79899 Other long term (current) drug therapy: Secondary | ICD-10-CM | POA: Insufficient documentation

## 2013-07-24 DIAGNOSIS — C7A098 Malignant carcinoid tumors of other sites: Secondary | ICD-10-CM | POA: Insufficient documentation

## 2013-07-24 DIAGNOSIS — K319 Disease of stomach and duodenum, unspecified: Secondary | ICD-10-CM | POA: Insufficient documentation

## 2013-07-24 DIAGNOSIS — Z9884 Bariatric surgery status: Secondary | ICD-10-CM | POA: Insufficient documentation

## 2013-07-24 DIAGNOSIS — I10 Essential (primary) hypertension: Secondary | ICD-10-CM | POA: Insufficient documentation

## 2013-07-24 LAB — PROTIME-INR
INR: 0.96 (ref 0.00–1.49)
Prothrombin Time: 12.6 seconds (ref 11.6–15.2)

## 2013-07-24 LAB — CBC WITH DIFFERENTIAL/PLATELET
Basophils Relative: 0 % (ref 0–1)
Eosinophils Absolute: 0 10*3/uL (ref 0.0–0.7)
Eosinophils Relative: 0 % (ref 0–5)
Lymphs Abs: 1.7 10*3/uL (ref 0.7–4.0)
MCH: 29.7 pg (ref 26.0–34.0)
MCHC: 32.2 g/dL (ref 30.0–36.0)
MCV: 92.4 fL (ref 78.0–100.0)
Monocytes Relative: 5 % (ref 3–12)
Neutrophils Relative %: 80 % — ABNORMAL HIGH (ref 43–77)
Platelets: 384 10*3/uL (ref 150–400)

## 2013-07-24 MED ORDER — LIDOCAINE HCL 1 % IJ SOLN
INTRAMUSCULAR | Status: AC
  Filled 2013-07-24: qty 20

## 2013-07-24 MED ORDER — HEPARIN SOD (PORK) LOCK FLUSH 100 UNIT/ML IV SOLN: 500.0000 [IU] | INTRAVENOUS | Status: AC | PRN

## 2013-07-24 MED ORDER — IOHEXOL 300 MG/ML  SOLN
1.0000 mL | Freq: Once | INTRAMUSCULAR | Status: AC | PRN
  Administered 2013-07-24: 1 mL via INTRAVENOUS

## 2013-07-24 MED ORDER — CEFAZOLIN SODIUM-DEXTROSE 2-3 GM-% IV SOLR
INTRAVENOUS | Status: AC
  Filled 2013-07-24: qty 50

## 2013-07-24 MED ORDER — ONDANSETRON HCL 4 MG/2ML IJ SOLN: 4.0000 mg | Freq: Once | INTRAMUSCULAR | Status: DC

## 2013-07-24 MED ORDER — GLUCAGON HCL (RDNA) 1 MG IJ SOLR
INTRAMUSCULAR | Status: AC | PRN
  Administered 2013-07-24: 1 mg via INTRAVENOUS

## 2013-07-24 MED ORDER — SODIUM CHLORIDE 0.9 % IV SOLN
INTRAVENOUS | Status: DC
  Administered 2013-07-24: 08:00:00 via INTRAVENOUS

## 2013-07-24 MED ORDER — HEPARIN SOD (PORK) LOCK FLUSH 100 UNIT/ML IV SOLN
INTRAVENOUS | Status: AC
  Administered 2013-07-24: 500 [IU]
  Filled 2013-07-24: qty 5

## 2013-07-24 MED ORDER — HEPARIN SOD (PORK) LOCK FLUSH 100 UNIT/ML IV SOLN: 500.0000 [IU] | INTRAVENOUS | Status: DC | PRN

## 2013-07-24 MED ORDER — ONDANSETRON HCL 4 MG/2ML IJ SOLN
INTRAMUSCULAR | Status: AC
  Filled 2013-07-24: qty 2

## 2013-07-24 MED ORDER — CEFAZOLIN SODIUM-DEXTROSE 2-3 GM-% IV SOLR
2.0000 g | Freq: Once | INTRAVENOUS | Status: AC
  Administered 2013-07-24: 2 g via INTRAVENOUS
  Filled 2013-07-24: qty 50

## 2013-07-24 MED ORDER — MIDAZOLAM HCL 2 MG/2ML IJ SOLN
INTRAMUSCULAR | Status: AC
  Filled 2013-07-24: qty 6

## 2013-07-24 MED ORDER — FENTANYL CITRATE 0.05 MG/ML IJ SOLN
INTRAMUSCULAR | Status: AC | PRN
  Administered 2013-07-24 (×2): 50 ug via INTRAVENOUS
  Administered 2013-07-24 (×2): 25 ug via INTRAVENOUS

## 2013-07-24 MED ORDER — MIDAZOLAM HCL 2 MG/2ML IJ SOLN
INTRAMUSCULAR | Status: AC | PRN
  Administered 2013-07-24: 0.5 mg via INTRAVENOUS
  Administered 2013-07-24: 1 mg via INTRAVENOUS
  Administered 2013-07-24: 0.5 mg via INTRAVENOUS
  Administered 2013-07-24: 1 mg via INTRAVENOUS

## 2013-07-24 MED ORDER — HYDROCODONE-ACETAMINOPHEN 5-325 MG PO TABS: 1.0000 | ORAL_TABLET | ORAL | Status: DC | PRN

## 2013-07-24 MED ORDER — SODIUM CHLORIDE 0.9 % IJ SOLN
10.0000 mL | Freq: Once | INTRAMUSCULAR | Status: AC
  Administered 2013-07-24: 10 mL via INTRAVENOUS

## 2013-07-24 MED ORDER — FENTANYL CITRATE 0.05 MG/ML IJ SOLN
INTRAMUSCULAR | Status: AC
  Filled 2013-07-24: qty 6

## 2013-07-24 MED ORDER — GLUCAGON HCL (RDNA) 1 MG IJ SOLR
INTRAMUSCULAR | Status: AC
  Filled 2013-07-24: qty 1

## 2013-07-24 MED ORDER — ONDANSETRON HCL 4 MG/2ML IJ SOLN: 4.0000 mg | INTRAMUSCULAR | Status: DC | PRN

## 2013-07-24 NOTE — Procedures (Signed)
34f gastrostomy placement No complication No blood loss. See complete dictation in [facility].

## 2013-07-24 NOTE — H&P (Signed)
Lori Dennis is an 67 y.o. female.   Chief Complaint: "I'm getting a stomach tube put in", persistent nausea/vomiting HPI: Patient with history of metastatic carcinoid and previous decompressive ileostomy/gastric bypass surgery presents today for decompressive percutaneous gastrostomy tube placement due to progressive nausea/vomiting.  Past Medical History  Diagnosis Date  . Complication of anesthesia     slow to wake up  . Blood transfusion without reported diagnosis 2009 and 2011  . Hypertension     off all bp meds since Dec 20, 2012  . Chronic kidney disease 12-19-2012    elevated bun and creatinine   . Cancer 02/19/2008    Metastatic carcinoid tumor    Past Surgical History  Procedure Laterality Date  . Bowel resection  06/08/2010    ileocolonic resection, enteroenterostomy  . Left colectomy  04/02/2008    Exploratory laparotomy, biopsy of omental nodule, sigmoid colectomy  . Vaginal hysterectomy      at age 25  . Cholecystectomy      age 9  . Portacath placement Right 02/06/2013    Procedure: Ultrasound guided PORT-A-CATH INSERTION with flouro;  Surgeon: Adolph Pollack, MD;  Location: [facility];  Service: General;  Laterality: Right;  . Esophagogastroduodenoscopy N/A 06/28/2013    Procedure: ESOPHAGOGASTRODUODENOSCOPY (EGD);  Surgeon: Vertell Novak., MD;  Location: [facility];  Service: Endoscopy;  Laterality: N/A;    Family History  Problem Relation Age of Onset  . Heart disease Mother   . Cancer Sister     ovarian   Social History:  reports that she has never smoked. She has never used smokeless tobacco. She reports that she does not drink alcohol or use illicit drugs.  Allergies: No Known Allergies  Current outpatient prescriptions:antiseptic oral rinse (BIOTENE) LIQD, 15 mLs by Mouth Rinse route as needed (for dry mouth)., Disp: , Rfl: ;  TPN ADULT, Inject into the vein continuous. 14 hours/day start between 2000-2100, Disp: , Rfl: ;  lidocaine-prilocaine  (EMLA) cream, Apply topically as needed. Apply to [facility] site 1 hour prior to stick and cover with plastic wrap to numb site, Disp: 30 g, Rfl: prn menthol-cetylpyridinium (CEPACOL) 3 MG lozenge, Take 1 lozenge by mouth as needed for pain., Disp: , Rfl: ;  ondansetron (ZOFRAN) 8 MG tablet, Take 1 tablet (8 mg total) by mouth 2 (two) times daily. To control nausea, Disp: 60 tablet, Rfl: 1;  oxyCODONE (ROXICODONE INTENSOL) 100 MG/5ML concentrated solution, Take 0.3-0.5 mLs (6-10 mg total) by mouth every 4 (four) hours as needed for pain., Disp: 30 mL, Rfl: 0 promethazine (PHENERGAN) 12.5 MG suppository, Place 1 suppository (12.5 mg total) rectally every 6 (six) hours as needed for nausea (if unable to tolerate po)., Disp: 30 each, Rfl: 1;  promethazine (PHENERGAN) 12.5 MG tablet, Take 1/2 - 1 tablet PO Q6h prn nausea, Disp: 30 tablet, Rfl: 1 Current facility-administered medications:0.9 %  sodium chloride infusion, , Intravenous, Continuous, Brayton El, PA-C, Last Rate: 20 mL/hr at 07/24/13 0813;  ceFAZolin (ANCEF) IVPB 2 g/50 mL premix, 2 g, Intravenous, Once, Brayton El, PA-C  Results for orders placed during the hospital encounter of 07/24/13 (from the past 48 hour(s))  APTT     Status: None   Collection Time    07/24/13  8:00 AM      Result Value Range   aPTT 28  24 - 37 seconds  CBC WITH DIFFERENTIAL     Status: Abnormal   Collection Time    07/24/13  8:00 AM  Result Value Range   WBC 11.6 (*) 4.0 - 10.5 K/uL   RBC 3.70 (*) 3.87 - 5.11 MIL/uL   Hemoglobin 11.0 (*) 12.0 - 15.0 g/dL   HCT 16.1 (*) 09.6 - 04.5 %   MCV 92.4  78.0 - 100.0 fL   MCH 29.7  26.0 - 34.0 pg   MCHC 32.2  30.0 - 36.0 g/dL   RDW 40.9 (*) 81.1 - 91.4 %   Platelets 384  150 - 400 K/uL   Neutrophils Relative % 80 (*) 43 - 77 %   Neutro Abs 9.3 (*) 1.7 - 7.7 K/uL   Lymphocytes Relative 15  12 - 46 %   Lymphs Abs 1.7  0.7 - 4.0 K/uL   Monocytes Relative 5  3 - 12 %   Monocytes Absolute 0.5  0.1 - 1.0 K/uL    Eosinophils Relative 0  0 - 5 %   Eosinophils Absolute 0.0  0.0 - 0.7 K/uL   Basophils Relative 0  0 - 1 %   Basophils Absolute 0.1  0.0 - 0.1 K/uL  PROTIME-INR     Status: None   Collection Time    07/24/13  8:00 AM      Result Value Range   Prothrombin Time 12.6  11.6 - 15.2 seconds   INR 0.96  0.00 - 1.49   No results found.  Review of Systems  Constitutional: Positive for malaise/fatigue. Negative for fever and chills.  HENT:       Occ HA's  Respiratory: Negative for cough and shortness of breath.   Cardiovascular: Negative for chest pain.  Gastrointestinal: Positive for nausea, vomiting and abdominal pain.  Musculoskeletal: Negative for back pain.  Neurological: Positive for weakness.  Endo/Heme/Allergies: Does not bruise/bleed easily.   Vitals: BP 123/75  HR 84  R 18  TEMP 98  O2 SATS 100% RA Physical Exam  Constitutional: She is oriented to person, place, and time.  Thin WF in NAD  Cardiovascular: Normal rate and regular rhythm.   Respiratory: Effort normal and breath sounds normal.  Intact rt chest wall PAC  GI: Soft. Bowel sounds are normal. There is tenderness.  LLQ ileostomy intact  Musculoskeletal: Normal range of motion. She exhibits no edema.  Neurological: She is alert and oriented to person, place, and time.     Assessment/Plan Pt with hx metastatic carcinoid,  previous decompressive ileostomy/gastric bypass surgery and progressive nausea/vomiting. She currently utilizes port a cath for TNA and plans are now for decompressive/palliative percutaneous gastrostomy tube placement. Details/risks of procedure d/w pt/daughters with their understanding and consent.  ALLRED,D KEVIN 07/24/2013, 8:52 AM

## 2013-07-24 NOTE — ED Notes (Signed)
Stomach decompressed post PEG tube placement.  1000cc bilious gastric fluid removed.

## 2013-07-24 NOTE — Progress Notes (Signed)
Ward Givens ([facility]) spoke with patient about home health visits. States she will contact nurse for home visits.

## 2013-07-25 ENCOUNTER — Other Ambulatory Visit: Payer: Self-pay

## 2013-07-25 ENCOUNTER — Telehealth: Payer: Self-pay | Admitting: *Deleted

## 2013-07-25 NOTE — Telephone Encounter (Signed)
[name] with [facility] called reportring patient has > 1000 ml drained from her gastrostomy tube.  Tube was placed due to n/v.  [facility] visits once a week.  [name] asked if Dr. Truett Perna wants to set any limits to the drainage or should G tube be clamped. Dr. Truett Perna notified.   Verbal order received and read back from Dr. Truett Perna for G tube to not have limits to the drainage volumes and to clamp as needed.  Teach patient to clamp and unclamp tube as needed for n/v.  Also [facility] needs to contact the IR department for G-tube bandadge/dressing change and flush instructions.  [name] given these orders.

## 2013-08-07 ENCOUNTER — Telehealth: Payer: Self-pay | Admitting: *Deleted

## 2013-08-07 NOTE — Telephone Encounter (Signed)
Called pt's daughter. Received [facility] Consult request. Signed by Dr. Truett Perna. Informed her past chemo is mentioned in his assessment, which will be faxed with medical records.

## 2013-08-08 ENCOUNTER — Telehealth: Payer: Self-pay | Admitting: Oncology

## 2013-08-08 NOTE — Telephone Encounter (Signed)
Mailed pt's medical records to [facility]

## 2013-08-09 ENCOUNTER — Telehealth: Payer: Self-pay | Admitting: Dietician

## 2013-08-10 ENCOUNTER — Telehealth: Payer: Self-pay | Admitting: *Deleted

## 2013-08-10 NOTE — Telephone Encounter (Signed)
Labs received from [facility] with "alert value" CO2 40. Reviewed by Dr. Truett Perna, may be related to dehydration, have pt take extra IV fluids over the weekend. Called pt, she reports she has had a good day. Has gone out to run errands, feels well. Per Dr. Truett Perna: will check lab in one week as scheduled. Pt voiced understanding.

## 2013-08-15 ENCOUNTER — Ambulatory Visit (HOSPITAL_BASED_OUTPATIENT_CLINIC_OR_DEPARTMENT_OTHER): Payer: Medicare Other | Admitting: Oncology

## 2013-08-15 ENCOUNTER — Telehealth: Payer: Self-pay | Admitting: Oncology

## 2013-08-15 VITALS — BP 121/84 | HR 84 | Temp 97.1°F | Resp 17 | Ht 63.5 in | Wt 120.4 lb

## 2013-08-15 DIAGNOSIS — Z933 Colostomy status: Secondary | ICD-10-CM

## 2013-08-15 DIAGNOSIS — D3A098 Benign carcinoid tumors of other sites: Secondary | ICD-10-CM

## 2013-08-15 DIAGNOSIS — D490 Neoplasm of unspecified behavior of digestive system: Secondary | ICD-10-CM

## 2013-08-15 DIAGNOSIS — Z931 Gastrostomy status: Secondary | ICD-10-CM

## 2013-08-15 MED ORDER — NYSTATIN 100000 UNIT/ML MT SUSP: 500000.0000 [IU] | Freq: Four times a day (QID) | OROMUCOSAL | Status: DC

## 2013-08-15 NOTE — Progress Notes (Signed)
Lori Dennis    OFFICE PROGRESS NOTE   INTERVAL HISTORY:   She underwent placement of a gastrostomy tube on 07/24/2013. She reports this is made a significant difference in her quality of life. She has only vomited when the tube has become "blocked ". She is now flushing the gastrostomy tube. She is starting to get out of the house. The abdominal pain is relieved with oxycodone.  Objective:  Vital signs in last 24 hours:  Blood pressure 121/84, pulse 84, temperature 97.1 F (36.2 C), temperature source Oral, resp. rate 17, height 5' 3.5" (1.613 m), weight 120 lb 6.4 oz (54.613 kg).    HEENT: Thick whitecoat over the tongue, no buccal thrush, right conjunctival erythema Resp: Lungs clear bilaterally Cardio: Regular rate and rhythm GI: Left upper quadrant gastrostomy tube site with mild erythema at the skin exit, small amount of liquid stool in the colostomy bag. Firmness in the right mid and lower abdomen. Vascular: No leg edema   Portacath/PICC-mild erythema superior to the port  Lab Results:  Lab Results  Component Value Date   WBC 11.6* 07/24/2013   HGB 11.0* 07/24/2013   HCT 34.2* 07/24/2013   MCV 92.4 07/24/2013   PLT 384 07/24/2013      Medications: I have reviewed the patient's current medications.  Assessment/Plan: 1.Metastatic low grade carcinoid tumor, s/p 6 cycles of xeloda/temozolamide beginnning 09/06/2010.  -The chromagranin A and 24 hour Urine 5-HIAA were not significantly changed on 07/26/2011.  -Restaging CT 06/26/2012 with overall stable disease compared to a CT from June of 2011  -She began a trial of sunitinib on approximately 05/22/2013  2. Admission with SBO/SB infarction 06/08/2010, s/p resection of small bowel and colon.  3. S/p sigmoid colectomy/colostomy, right colectomy, and gastrojejunostomy in April 2009.  4. Elevated pre-operative 24-hour urine 5-HIAA.  5. Acute N/V April 2011, likely related to a partial small bowel obstruction due  to tumor. Symptoms resolved with bowel rest and a liquid diet.  6. Intermittent loose stools-? Secondary to bowel resection or carcinoid. Partially improved with Sandostatin LAR. She is no longer on Sandostatin.  7. Intermittent episodes of nausea and right buttock pain, resolving spontaneously-? related to carcinomatosis.  8. Right scalp herpes zoster rash July 2013  10. Admission with a small bowel obstruction 12/19/2012-status post NG tube decompression/bowel rest, CT the abdomen on 12/18/2012 revealed dilated small bowel loops and stomach, mild progression of capsular liver lesions, and no significant change in the pelvic masses . She continues to have intermittent obstructive symptoms. The nausea initially improved with low-dose prednisone, but she developed progressive nausea with intermittent emesis.  11. Elevated BUN and creatinine 01/26/2013-likely secondary to dehydration, a renal ultrasound on 01/24/2013 negative for hydronephrosis, the BUN/creatinine were improved after receiving intravenous fluids.  12. Port-A-Cath placement 02/06/2013.  13. Nutrition. She is maintained on TPN.  14. Persistent nausea and vomiting-likely secondary to carcinomatosis with intermittent bowel obstruction or a direct nausea effect from the cancer.  -Status post placement of a palliative gastrostomy tube on 07/24/2013 -CT 06/24/2013 and upper endoscopy 06/28/2013 consistent with a proximal small bowel obstruction  15. Hypokalemia secondary to diarrhea and emesis-the potassium is been repleted in the home TNA  16. Elevated CO2-likely related to dehydration and contraction alkalosis    Disposition:  Her performance status and quality of life have improved since placement of the gastrostomy tube. The CO2 has been elevated on recent labs, likely related to dehydration. I encouraged her to use supplemental IV fluids  at least every other day. She continues home TNA. She will flush the gastrostomy tube on a regular  schedule.  The right conjunctival erythema is likely related to viral conjunctivitis. She will contact us if this does not resolve.  L[facility] has submitted her records to the [facility] for another opinion.  She will return for an office visit in one month.   Lori Papas, MD  08/15/2013  9:45 AM

## 2013-08-15 NOTE — Telephone Encounter (Signed)
Gave pt appt for MD only Septem,ber  2014

## 2013-08-15 NOTE — Addendum Note (Signed)
Addended by: Wandalee Ferdinand on: 08/15/2013 10:28 AM   Modules accepted: Orders

## 2013-08-17 ENCOUNTER — Telehealth: Payer: Self-pay | Admitting: Dietician

## 2013-08-24 ENCOUNTER — Encounter: Payer: Self-pay | Admitting: Oncology

## 2013-08-31 ENCOUNTER — Telehealth: Payer: Self-pay | Admitting: *Deleted

## 2013-08-31 NOTE — Telephone Encounter (Signed)
Pt's daughter Heinz Knuckles left message wanting confirmation of office receipt for medical record from [facility].  Confirmed with Lasheena Frieze that we did receive request and forwarded to HIM.  Yeilyn Gent verbalized understanding and expressed appreciation for call back.

## 2013-09-04 ENCOUNTER — Encounter: Payer: Self-pay | Admitting: Oncology

## 2013-09-05 ENCOUNTER — Telehealth: Payer: Self-pay | Admitting: Oncology

## 2013-09-05 ENCOUNTER — Encounter: Payer: Self-pay | Admitting: Oncology

## 2013-09-05 NOTE — Telephone Encounter (Signed)
Faxed pt medical records to [facility]. The slides and scans will be fedex'ed  (per pt)

## 2013-09-07 ENCOUNTER — Ambulatory Visit: Payer: Medicare Other | Admitting: Lab

## 2013-09-07 ENCOUNTER — Telehealth: Payer: Self-pay | Admitting: *Deleted

## 2013-09-07 ENCOUNTER — Ambulatory Visit (HOSPITAL_BASED_OUTPATIENT_CLINIC_OR_DEPARTMENT_OTHER): Payer: Medicare Other | Admitting: Physician Assistant

## 2013-09-07 ENCOUNTER — Ambulatory Visit (HOSPITAL_BASED_OUTPATIENT_CLINIC_OR_DEPARTMENT_OTHER): Payer: Medicare Other

## 2013-09-07 ENCOUNTER — Encounter: Payer: Self-pay | Admitting: Physician Assistant

## 2013-09-07 VITALS — BP 121/85 | HR 90 | Temp 97.0°F | Resp 18 | Wt 118.7 lb

## 2013-09-07 DIAGNOSIS — D3A098 Benign carcinoid tumors of other sites: Secondary | ICD-10-CM

## 2013-09-07 DIAGNOSIS — R112 Nausea with vomiting, unspecified: Secondary | ICD-10-CM

## 2013-09-07 DIAGNOSIS — C7A00 Malignant carcinoid tumor of unspecified site: Secondary | ICD-10-CM

## 2013-09-07 DIAGNOSIS — L0390 Cellulitis, unspecified: Secondary | ICD-10-CM

## 2013-09-07 DIAGNOSIS — L0291 Cutaneous abscess, unspecified: Secondary | ICD-10-CM

## 2013-09-07 MED ORDER — SODIUM CHLORIDE 0.9 % IV SOLN
INTRAVENOUS | Status: DC
  Administered 2013-09-07: 17:00:00 via INTRAVENOUS

## 2013-09-07 MED ORDER — LEVOFLOXACIN 750 MG PO TABS: 750.0000 mg | ORAL_TABLET | Freq: Every day | ORAL | Status: DC

## 2013-09-07 MED ORDER — DEXTROSE 5 % IV SOLN
1.0000 g | INTRAVENOUS | Status: DC
  Administered 2013-09-07: 1 g via INTRAVENOUS
  Filled 2013-09-07: qty 10

## 2013-09-07 MED ORDER — DEXTROSE 5 % IV SOLN: 2.0000 g | INTRAVENOUS | Status: DC

## 2013-09-07 NOTE — Progress Notes (Signed)
[facility]    OFFICE PROGRESS NOTE   INTERVAL HISTORY:   She underwent placement of a gastrostomy tube on 07/24/2013. She reports this is made a significant difference in her quality of life. She's had no recent vomiting. She presents today for work in appointment to evaluate her Port-A-Cath. The home health nurse came to change out her Port-A-Cath needle and the surface of her Port-A-Cath was found to be read and the nurse was concerned about infection. She denies fever or chills.  Objective:  Vital signs in last 24 hours:  Blood pressure 121/85, pulse 90, temperature 97 F (36.1 C), temperature source Oral, resp. rate 18, weight 118 lb 11.2 oz (53.842 kg).    HEENT: clear, no buccal thrush, Resp: Lungs clear bilaterally Cardio: Regular rate and rhythm GI: Left upper quadrant gastrostomy tube site with mild erythema at the skin exit, small amount of liquid stool in the colostomy bag. Firmness in the right mid and lower abdomen. Vascular: No leg edema Skin: The central area of the Port-A-Cath is erythematous minimally warm no fluctuance and no drainage of any serous or purulent material. The Port-A-Cath itself is not fluctuant nor tender there is no erythematous tracking along the tubing of the Port-A-Cath. There is some erythema in the periphery in the outline of the overlying dressing bandage that is clearly a skin reaction to the tape or type of bandage that is being used. The skin is generally dry.    Lab Results:  Lab Results  Component Value Date   WBC 11.6* 07/24/2013   HGB 11.0* 07/24/2013   HCT 34.2* 07/24/2013   MCV 92.4 07/24/2013   PLT 384 07/24/2013      Medications: I have reviewed the patient's current medications.  Assessment/Plan: 1.Metastatic low grade carcinoid tumor, s/p 6 cycles of xeloda/temozolamide beginnning 09/06/2010.  -The chromagranin A and 24 hour Urine 5-HIAA were not significantly changed on 07/26/2011.  -Restaging CT 06/26/2012 with  overall stable disease compared to a CT from June of 2011  -She began a trial of sunitinib on approximately 05/22/2013  2. Admission with SBO/SB infarction 06/08/2010, s/p resection of small bowel and colon.  3. S/p sigmoid colectomy/colostomy, right colectomy, and gastrojejunostomy in April 2009.  4. Elevated pre-operative 24-hour urine 5-HIAA.  5. Acute N/V April 2011, likely related to a partial small bowel obstruction due to tumor. Symptoms resolved with bowel rest and a liquid diet.  6. Intermittent loose stools-? Secondary to bowel resection or carcinoid. Partially improved with Sandostatin LAR. She is no longer on Sandostatin.  7. Intermittent episodes of nausea and right buttock pain, resolving spontaneously-? related to carcinomatosis.  8. Right scalp herpes zoster rash July 2013  10. Admission with a small bowel obstruction 12/19/2012-status post NG tube decompression/bowel rest, CT the abdomen on 12/18/2012 revealed dilated small bowel loops and stomach, mild progression of capsular liver lesions, and no significant change in the pelvic masses . She continues to have intermittent obstructive symptoms. The nausea initially improved with low-dose prednisone, but she developed progressive nausea with intermittent emesis.  11. Elevated BUN and creatinine 01/26/2013-likely secondary to dehydration, a renal ultrasound on 01/24/2013 negative for hydronephrosis, the BUN/creatinine were improved after receiving intravenous fluids.  12. Port-A-Cath placement 02/06/2013.  13. Nutrition. She is maintained on TPN.  14. Persistent nausea and vomiting-likely secondary to carcinomatosis with intermittent bowel obstruction or a direct nausea effect from the cancer.  -Status post placement of a palliative gastrostomy tube on 07/24/2013 -CT 06/24/2013 and  upper endoscopy 06/28/2013 consistent with a proximal small bowel obstruction  15. Hypokalemia secondary to diarrhea and emesis-the potassium is been  repleted in the home TNA  16. Elevated CO2-likely related to dehydration and contraction alkalosis 17. Questionable early cellulitis of the skin overlying the Port-A-Cath. The patient was reviewed with Dr. Truett Perna. We will obtain a set of blood cultures from the Port-A-Cath a give the patient 1 g of Rocephin IV here in our infusion Center. Additionally will place her on a 10 day course of Levaquin at 750 mg by mouth daily. She may crush this and take it with some applesauce so that she may potentially absorb it better. She knows to clamp off her J-tube for at least one to 2 hours after taking  the oral antibiotic. She will followup as previously scheduled with Dr. Truett Perna on 09/12/2013. She understands to present to the emergency room for evaluation should she develop high fever or shaking chills increased erythema or streaking of the erythema up into her neck or across her chest.     Disposition:  As previously stated we'll we will obtain one set of blood cultures from her Port-A-Cath, and she will be given 1 g of Rocephin IV through the Port-A-Cath. We will leave her Port-A-Cath accessed so that she may receive her TPN as scheduled. We will try some different bandaged and dressing combination so so hopefully not your take her skin is much. She will be placed on a 10 day course of Levaquin at 750 mg by mouth which she may crush and take with applesauce or putting for you see your absorption. She will followup as previously scheduled with Dr. Truett Perna on 09/12/2013. She will seek emergency room evaluation for fever or shaking chills or worsening signs or symptoms of infection.   Conni Slipper, PA-C  09/07/2013  4:54 PM

## 2013-09-07 NOTE — Telephone Encounter (Signed)
Reviewed earlier note with Dr. Truett Perna: Schedule appt to have port evaluated today. Left message on voicemail for pt's daughter with appt for 3:15 to see NP. Left message on voicemail for Autumn with update.

## 2013-09-07 NOTE — Patient Instructions (Addendum)
Crush you're Levaquin and take with a tablespoon of applesauce or pudding  once daily. Remember to clamp off your J-tube for at least one or 2 hours after taking your lab I  Report to the emergency room for high fevers, shaking chills or any other worrisome signs of worsening infection  Followup with Dr. Truett Perna as previously scheduled on 09/12/2013

## 2013-09-07 NOTE — Telephone Encounter (Signed)
Call from [place], [place] [facility] nurse reporting port a cath insertion site is red and raised. Area is tender to touch. Pt is afebrile. Erythema has not spread. Nurse is unsure if site is irritated due to the angle in which it was taped.

## 2013-09-07 NOTE — Patient Instructions (Addendum)
Ceftriaxone injection  What is this medicine?  CEFTRIAXONE (sef try AX one) is a cephalosporin antibiotic. It is used to treat certain kinds of bacterial infections. It It will not work for colds, flu, or other viral infections.  This medicine may be used for other purposes; ask your health care provider or pharmacist if you have questions.  What should I tell my health care provider before I take this medicine?  They need to know if you have any of these conditions:  -any chronic illness  -bowel disease, like colitis  -both kidney and liver disease  -high bilirubin level in newborn patients  -an unusual or allergic reaction to ceftriaxone, other cephalosporin or penicillin antibiotics, foods, dyes or preservatives  -pregnant or trying to get pregnant  -breast-feeding  How should I use this medicine?  This medicine is injected into a muscle or infused it into a vein. It is usually given in a medical office or clinic. If you are to give this medicine you will be taught how to inject it. Follow instructions carefully. Use your doses at regular intervals. Do not take your medicine more often than directed. Do not skip doses or stop your medicine early even if you feel better. Do not stop taking except on your doctor's advice.  Talk to your pediatrician regarding the use of this medicine in children. Special care may be needed.  Overdosage: If you think you have taken too much of this medicine contact a poison control center or emergency room at once.  NOTE: This medicine is only for you. Do not share this medicine with others.  What if I miss a dose?  If you miss a dose, take it as soon as you can. If it is almost time for your next dose, take only that dose. Do not take double or extra doses.  What may interact with this medicine?  Do not take this medicine with any of the following medications:  -calcium  This medicine may also interact with the following medications:  -alcohol  -some other  antibiotics  -warfarin  This list may not describe all possible interactions. Give your health care provider a list of all the medicines, herbs, non-prescription drugs, or dietary supplements you use. Also tell them if you smoke, drink alcohol, or use illegal drugs. Some items may interact with your medicine.  What should I watch for while using this medicine?  Tell your doctor or health care professional if your symptoms do not improve or if they get worse.  Do not treat diarrhea with over the counter products. Contact your doctor if you have diarrhea that lasts more than 2 days or if it is severe and watery.  If you are being treated for a sexually transmitted disease, avoid sexual contact until you have finished your treatment. Having sex can infect your sexual partner.  Calcium may bind to this medicine and cause lung or kidney problems. Avoid calcium products while taking this medicine and for 48 hours after taking the last dose of this medicine.  What side effects may I notice from receiving this medicine?  Side effects that you should report to your doctor or health care professional as soon as possible:  -allergic reactions like skin rash, itching or hives, swelling of the face, lips, or tongue  -breathing problems  -fever, chills  -irregular heartbeat  -pain when passing urine  -seizures  -stomach pain, cramps  -unusual bleeding, bruising  -unusually weak or tired  Side effects that usually   do not require medical attention (report to your doctor or health care professional if they continue or are bothersome):  -diarrhea  -dizzy, drowsy  -headache  -nausea, vomiting  -pain, swelling, irritation where injected  -stomach upset  -sweating  This list may not describe all possible side effects. Call your doctor for medical advice about side effects. You may report side effects to FDA at 1-800-FDA-1088.  Where should I keep my medicine?  Keep out of the reach of children.  Store at room temperature below 25  degrees C (77 degrees F). Protect from light. Throw away any unused vials after the expiration date.  NOTE: This sheet is a summary. It may not cover all possible information. If you have questions about this medicine, talk to your doctor, pharmacist, or health care provider.   2013, [employer]. (03/12/2008 1:34:22 PM)

## 2013-09-12 ENCOUNTER — Telehealth: Payer: Self-pay | Admitting: Oncology

## 2013-09-12 ENCOUNTER — Ambulatory Visit (HOSPITAL_BASED_OUTPATIENT_CLINIC_OR_DEPARTMENT_OTHER): Payer: Medicare Other | Admitting: Oncology

## 2013-09-12 VITALS — BP 110/74 | HR 83 | Temp 97.6°F | Resp 18 | Ht 63.0 in | Wt 121.3 lb

## 2013-09-12 DIAGNOSIS — L539 Erythematous condition, unspecified: Secondary | ICD-10-CM

## 2013-09-12 DIAGNOSIS — R197 Diarrhea, unspecified: Secondary | ICD-10-CM

## 2013-09-12 DIAGNOSIS — Z23 Encounter for immunization: Secondary | ICD-10-CM

## 2013-09-12 DIAGNOSIS — E876 Hypokalemia: Secondary | ICD-10-CM

## 2013-09-12 DIAGNOSIS — C7A00 Malignant carcinoid tumor of unspecified site: Secondary | ICD-10-CM

## 2013-09-12 DIAGNOSIS — D3A098 Benign carcinoid tumors of other sites: Secondary | ICD-10-CM

## 2013-09-12 DIAGNOSIS — R112 Nausea with vomiting, unspecified: Secondary | ICD-10-CM

## 2013-09-12 MED ORDER — INFLUENZA VAC SPLIT QUAD 0.5 ML IM SUSP
0.5000 mL | Freq: Once | INTRAMUSCULAR | Status: AC
  Administered 2013-09-12: 0.5 mL via INTRAMUSCULAR
  Filled 2013-09-12: qty 0.5

## 2013-09-12 MED ORDER — FLUCONAZOLE 50 MG PO TABS: 50.0000 mg | ORAL_TABLET | Freq: Every day | ORAL | Status: DC

## 2013-09-12 NOTE — Telephone Encounter (Signed)
gv pt appt schedule for October.  °

## 2013-09-12 NOTE — Progress Notes (Signed)
[facility]    OFFICE PROGRESS NOTE   INTERVAL HISTORY:   She returns as scheduled. When she was here on 09/06/2013 the Port-A-Cath site was erythematous. She was treated with ceftriaxone and continues Levaquin. The erythema has improved. No fever. Minimal output from the ostomy bag for the past 3-4 days. She is drinking and the majority of the liquid comes out the gastrostomy tube.  Objective:  Vital signs in last 24 hours:  Blood pressure 110/74, pulse 83, temperature 97.6 F (36.4 C), temperature source Oral, resp. rate 18, height 5\' 3"  (1.6 m), weight 121 lb 4.8 oz (55.021 kg).    HEENT: whitecoat over the tongue, no buccal thrush Resp: lungs clear bilaterally Cardio: regular rate and rhythm GI: firm with tenderness in the right abdomen, minimal stool in the left abdomen ostomy bag, gastrostomy tube site without evidence of infection Vascular: no leg edema   Portacath/PICC-with mild erythema surrounding the access needle and upper aspect of the port-improved compared to 09/07/2013  Medications: I have reviewed the patient's current medications.  Assessment/Plan: 1.Metastatic low grade carcinoid tumor, s/p 6 cycles of xeloda/temozolamide beginnning 09/06/2010.  -The chromagranin A and 24 hour Urine 5-HIAA were not significantly changed on 07/26/2011.  -Restaging CT 06/26/2012 with overall stable disease compared to a CT from June of 2011  -She began a trial of sunitinib on approximately 05/22/2013  2. Admission with SBO/SB infarction 06/08/2010, s/p resection of small bowel and colon.  3. S/p sigmoid colectomy/colostomy, right colectomy, and gastrojejunostomy in April 2009.  4. Elevated pre-operative 24-hour urine 5-HIAA.  5. Acute N/V April 2011, likely related to a partial small bowel obstruction due to tumor. Symptoms resolved with bowel rest and a liquid diet.  6. Intermittent loose stools-? Secondary to bowel resection or carcinoid. Partially improved with  Sandostatin LAR. She is no longer on Sandostatin.  7. Intermittent episodes of nausea and right buttock pain, resolving spontaneously-? related to carcinomatosis.  8. Right scalp herpes zoster rash July 2013  10. Admission with a small bowel obstruction 12/19/2012-status post NG tube decompression/bowel rest, CT the abdomen on 12/18/2012 revealed dilated small bowel loops and stomach, mild progression of capsular liver lesions, and no significant change in the pelvic masses . She continues to have intermittent obstructive symptoms. The nausea initially improved with low-dose prednisone, but she developed progressive nausea with intermittent emesis.  11. Elevated BUN and creatinine 01/26/2013-likely secondary to dehydration, a renal ultrasound on 01/24/2013 negative for hydronephrosis, the BUN/creatinine were improved after receiving intravenous fluids.  12. Port-A-Cath placement 02/06/2013.  13. Nutrition. She is maintained on TPN.  14. Persistent nausea and vomiting-likely secondary to carcinomatosis with intermittent bowel obstruction or a direct nausea effect from the cancer.  -Status post placement of a palliative gastrostomy tube on 07/24/2013  -CT 06/24/2013 and upper endoscopy 06/28/2013 consistent with a proximal small bowel obstruction  15. Hypokalemia secondary to diarrhea and emesis-the potassium is been repleted in the home TNA  16. History of an Elevated CO2-likely related to dehydration and contraction alkalosis  17. Erythema at the Port-A-Cath site 09/07/2013-improved, continuing a course of Levaquin  Disposition:  Ms. Laton continues to have obstructive symptoms related to the carcinomatosis. The palliative G-tube is in place and her performance status has improved. She will continue to unclamped the G-tube as needed. We prescribed a course of Diflucan for the apparent candidiasis over the tongue. She will complete the current course of Levaquin. Ms. Matsuura noted contact us for  increased erythema or  tenderness at the Port-A-Cath site. She received an influenza vaccine today. She will return for an office visit in one month.  Thornton Papas, MD  09/12/2013  6:00 PM

## 2013-09-13 ENCOUNTER — Encounter: Payer: Self-pay | Admitting: Oncology

## 2013-09-13 LAB — CULTURE, BLOOD (SINGLE)

## 2013-09-21 ENCOUNTER — Telehealth: Payer: Self-pay | Admitting: *Deleted

## 2013-09-21 NOTE — Telephone Encounter (Signed)
Wanted physician aware that she seems to be having more pain and has vomited once/day for about 3-4 days over the past week despite the G-tube to drainage. Tube is draining-not clogged.

## 2013-09-24 ENCOUNTER — Encounter: Payer: Self-pay | Admitting: Oncology

## 2013-09-26 ENCOUNTER — Encounter: Payer: Self-pay | Admitting: Oncology

## 2013-10-03 ENCOUNTER — Encounter: Payer: Self-pay | Admitting: Oncology

## 2013-10-05 ENCOUNTER — Telehealth: Payer: Self-pay | Admitting: *Deleted

## 2013-10-05 NOTE — Telephone Encounter (Signed)
Received labs from [facility] w/BUN at 59/creat 1.11. Pharmacy will call patient and instruct her to bolus more NS after her TNA infusion.

## 2013-10-11 ENCOUNTER — Telehealth: Payer: Self-pay | Admitting: Oncology

## 2013-10-11 ENCOUNTER — Ambulatory Visit (HOSPITAL_BASED_OUTPATIENT_CLINIC_OR_DEPARTMENT_OTHER): Payer: Medicare Other | Admitting: Oncology

## 2013-10-11 VITALS — BP 137/82 | HR 82 | Temp 97.3°F | Resp 19 | Ht 63.0 in | Wt 121.2 lb

## 2013-10-11 DIAGNOSIS — C7B8 Other secondary neuroendocrine tumors: Secondary | ICD-10-CM

## 2013-10-11 DIAGNOSIS — C7A00 Malignant carcinoid tumor of unspecified site: Secondary | ICD-10-CM

## 2013-10-11 DIAGNOSIS — D3A098 Benign carcinoid tumors of other sites: Secondary | ICD-10-CM

## 2013-10-11 MED ORDER — OXYCODONE HCL 20 MG/ML PO CONC: 6.0000 mg | ORAL | Status: DC | PRN

## 2013-10-11 MED ORDER — FLUCONAZOLE 50 MG PO TABS: 50.0000 mg | ORAL_TABLET | Freq: Every day | ORAL | Status: DC

## 2013-10-11 NOTE — Progress Notes (Signed)
[facility]    OFFICE PROGRESS NOTE   INTERVAL HISTORY:   She returns for scheduled visit. She continues home TNA, cycle that night. She takes extra IV fluids a few days per week. The abdominal pain is relieved with oxycodone. Diflucan helped the coating over the tongue. She is able to clamp the gastrostomy tube 4 hours at a time. There is some output in the colostomy bag. She is getting out of the home.  Objective:  Vital signs in last 24 hours:  Blood pressure 137/82, pulse 82, temperature 97.3 F (36.3 C), temperature source Oral, resp. rate 19, height 5\' 3"  (1.6 m), weight 121 lb 3.2 oz (54.976 kg).    HEENT: No buccal thrush or ulcers. Thick code over the tongue. Resp: Lungs clear bilaterally Cardio: Regular rate and rhythm GI: Small amount of liquid stool in the left lower quadrant ostomy bag. Firm mass in the right mid to lower abdomen Vascular: No leg edema    Portacath/PICC-with mild erythema at the incision superior to the port   Medications: I have reviewed the patient's current medications.  Assessment/Plan: .Metastatic low grade carcinoid tumor, s/p 6 cycles of xeloda/temozolamide beginnning 09/06/2010.  -The chromagranin A and 24 hour Urine 5-HIAA were not significantly changed on 07/26/2011.  -Restaging CT 06/26/2012 with overall stable disease compared to a CT from June of 2011  -She began a trial of sunitinib on approximately 05/22/2013  2. Admission with SBO/SB infarction 06/08/2010, s/p resection of small bowel and colon.  3. S/p sigmoid colectomy/colostomy, right colectomy, and gastrojejunostomy in April 2009.  4. Elevated pre-operative 24-hour urine 5-HIAA.  5. Acute N/V April 2011, likely related to a partial small bowel obstruction due to tumor. Symptoms resolved with bowel rest and a liquid diet.  6. Intermittent loose stools-? Secondary to bowel resection or carcinoid. Partially improved with Sandostatin LAR. She is no longer on  Sandostatin.  7. Intermittent episodes of nausea and right buttock pain, resolving spontaneously-? related to carcinomatosis.  8. Right scalp herpes zoster rash July 2013  10. Admission with a small bowel obstruction 12/19/2012-status post NG tube decompression/bowel rest, CT the abdomen on 12/18/2012 revealed dilated small bowel loops and stomach, mild progression of capsular liver lesions, and no significant change in the pelvic masses . She continues to have intermittent obstructive symptoms. The nausea initially improved with low-dose prednisone, but she developed progressive nausea with intermittent emesis.  11. Elevated BUN and creatinine 01/26/2013-likely secondary to dehydration, a renal ultrasound on 01/24/2013 negative for hydronephrosis, the BUN/creatinine were improved after receiving intravenous fluids.  12. Port-A-Cath placement 02/06/2013.  13. Nutrition. She is maintained on TPN.  14. Persistent nausea and vomiting-likely secondary to carcinomatosis with intermittent bowel obstruction or a direct nausea effect from the cancer.  -Status post placement of a palliative gastrostomy tube on 07/24/2013  -CT 06/24/2013 and upper endoscopy 06/28/2013 consistent with a proximal small bowel obstruction  15. Hypokalemia secondary to diarrhea and emesis-the potassium is been repleted in the home TNA  16. History of an Elevated CO2-likely related to dehydration and contraction alkalosis  17. Erythema at the Port-A-Cath site 09/07/2013-improved following a course of Levaquin   Disposition:  Her performance status appears improved today. She will continue home TNA and IV fluids. We prescribed another course of Diflucan for the oral candidiasis. She will continue oxycodone for pain. Ms. Lillo will return for an office visit in one month. The electrolytes are monitored via the home care pharmacy.   Thornton Papas,  MD  10/11/2013  5:02 PM

## 2013-10-11 NOTE — Telephone Encounter (Signed)
gv and printed appt sched and avs for pt for Dec °

## 2013-10-16 ENCOUNTER — Telehealth: Payer: Self-pay | Admitting: *Deleted

## 2013-10-16 NOTE — Telephone Encounter (Signed)
Called to ask if Dr. Truett Perna had received copy of the on line [facility] referral response? Made her aware that he had not. She will fax her copy to office. Fax received this afternoon and placed on MD desk with request to review and call daughter to discuss or does he prefer appointment to have them come in to discuss?

## 2013-10-19 ENCOUNTER — Telehealth: Payer: Self-pay | Admitting: Dietician

## 2013-10-23 ENCOUNTER — Encounter: Payer: Self-pay | Admitting: Oncology

## 2013-11-22 ENCOUNTER — Telehealth: Payer: Self-pay | Admitting: Oncology

## 2013-11-22 ENCOUNTER — Ambulatory Visit (HOSPITAL_BASED_OUTPATIENT_CLINIC_OR_DEPARTMENT_OTHER): Payer: Medicare Other | Admitting: Oncology

## 2013-11-22 VITALS — BP 144/84 | HR 82 | Temp 97.1°F | Resp 20 | Ht 63.0 in | Wt 125.1 lb

## 2013-11-22 DIAGNOSIS — C7A00 Malignant carcinoid tumor of unspecified site: Secondary | ICD-10-CM

## 2013-11-22 DIAGNOSIS — D649 Anemia, unspecified: Secondary | ICD-10-CM

## 2013-11-22 DIAGNOSIS — D3A098 Benign carcinoid tumors of other sites: Secondary | ICD-10-CM

## 2013-11-22 DIAGNOSIS — C7B8 Other secondary neuroendocrine tumors: Secondary | ICD-10-CM

## 2013-11-22 NOTE — Telephone Encounter (Signed)
Gave pt appt for Md only on Janaury 2015 per md

## 2013-11-22 NOTE — Progress Notes (Signed)
[facility]    OFFICE PROGRESS NOTE   INTERVAL HISTORY:   She returns as scheduled. She continues home TNA. The abdominal pain has improved. She has infrequent nausea. She unclamps the gastrostomy tube every few hours. Minimal output from the colostomy. She is getting out of the house.  Objective:  Vital signs in last 24 hours:  Blood pressure 144/84, pulse 82, temperature 97.1 F (36.2 C), temperature source Oral, resp. rate 20, height 5\' 3"  (1.6 m), weight 125 lb 1.6 oz (56.745 kg).    HEENT: Mild whitecoat over the tongue, no buccal thrush Resp: Lungs clear bilaterally Cardio: Regular rate and rhythm GI: Diffuse firmness in the right abdomen. Gastrostomy tube site without evidence of infection. Minimal brown liquid in the colostomy bag Vascular: Trace ankle edema bilaterally  Skin: Small ecchymoses at the upper chest and neck   Portacath/PICC-without erythema  Lab Results:  We continue to receive weekly labs from [facility].   Medications: I have reviewed the patient's current medications.  Assessment/Plan: 1.Metastatic low grade carcinoid tumor, s/p 6 cycles of xeloda/temozolamide beginnning 09/06/2010.  -The chromagranin A and 24 hour Urine 5-HIAA were not significantly changed on 07/26/2011.  -Restaging CT 06/26/2012 with overall stable disease compared to a CT from June of 2011  -She began a trial of sunitinib on approximately 05/22/2013  2. Admission with SBO/SB infarction 06/08/2010, s/p resection of small bowel and colon.  3. S/p sigmoid colectomy/colostomy, right colectomy, and gastrojejunostomy in April 2009.  4. Elevated pre-operative 24-hour urine 5-HIAA.  5. Acute N/V April 2011, likely related to a partial small bowel obstruction due to tumor. Symptoms resolved with bowel rest and a liquid diet.  6. Intermittent loose stools-? Secondary to bowel resection or carcinoid. Partially improved with Sandostatin LAR. She is no longer on  Sandostatin.  7. Intermittent episodes of nausea and right buttock pain, resolving spontaneously-? related to carcinomatosis.  8. Right scalp herpes zoster rash July 2013  10. Admission with a small bowel obstruction 12/19/2012-status post NG tube decompression/bowel rest, CT the abdomen on 12/18/2012 revealed dilated small bowel loops and stomach, mild progression of capsular liver lesions, and no significant change in the pelvic masses . She continues to have intermittent obstructive symptoms. The nausea initially improved with low-dose prednisone, but she developed progressive nausea with intermittent emesis.  11. Elevated BUN and creatinine 01/26/2013-likely secondary to dehydration, a renal ultrasound on 01/24/2013 negative for hydronephrosis, the BUN/creatinine were improved after receiving intravenous fluids.  12. Port-A-Cath placement 02/06/2013.  13. Nutrition. She is maintained on TPN.  14. Persistent nausea and vomiting-likely secondary to carcinomatosis with intermittent bowel obstruction or a direct nausea effect from the cancer.  -Status post placement of a palliative gastrostomy tube on 07/24/2013 , the nausea has improved -CT 06/24/2013 and upper endoscopy 06/28/2013 consistent with a proximal small bowel obstruction  15. Hypokalemia secondary to diarrhea and emesis-the potassium is been repleted in the home TNA  16. History of an Elevated CO2-likely related to dehydration and contraction alkalosis  17. Erythema at the Port-A-Cath site 09/07/2013-improved following a course of Levaquin  18. Anemia-the hemoglobin was lower when checked by home care last week. We aspirated repeat CBC on 11/23/2013.   Disposition:  Her for status appears improved today. The plan is to continue home TNA. Her records were submitted to the [facility] clinic for an online second opinion. No specific treatment for the metastatic carcinoid tumor was recommended. The consulting physician mentioned a  radioactive octreotide study  being conducted at [facility]. We will check into her eligibility for this study.  She will return for an office visit 12/26/2013.   Thornton Papas, MD  11/22/2013  3:23 PM

## 2013-12-14 ENCOUNTER — Telehealth: Payer: Self-pay | Admitting: *Deleted

## 2013-12-14 NOTE — Telephone Encounter (Signed)
Call from pt's daughter reporting the home health nurse who usually changes her port needle is out sick. Asking if it is OK to go past 7 days without changing the needle? They are uncomfortable having it changed by another nurse. Reviewed with Dr. Truett Perna: OK to delay a few days for needle exchange. Returned call to [place] with this info. She asks if it is possible to bring pt in for port re-access? YES. They will call office if pt is unable to have it done by [facility].

## 2013-12-26 ENCOUNTER — Telehealth: Payer: Self-pay | Admitting: Oncology

## 2013-12-26 ENCOUNTER — Encounter: Payer: Self-pay | Admitting: Oncology

## 2013-12-26 ENCOUNTER — Ambulatory Visit (HOSPITAL_BASED_OUTPATIENT_CLINIC_OR_DEPARTMENT_OTHER): Payer: Medicare Other | Admitting: Oncology

## 2013-12-26 VITALS — BP 153/84 | HR 82 | Temp 97.6°F | Resp 19 | Ht 63.0 in | Wt 124.9 lb

## 2013-12-26 DIAGNOSIS — C7B8 Other secondary neuroendocrine tumors: Secondary | ICD-10-CM

## 2013-12-26 DIAGNOSIS — D649 Anemia, unspecified: Secondary | ICD-10-CM

## 2013-12-26 DIAGNOSIS — R197 Diarrhea, unspecified: Secondary | ICD-10-CM

## 2013-12-26 DIAGNOSIS — D3A098 Benign carcinoid tumors of other sites: Secondary | ICD-10-CM

## 2013-12-26 DIAGNOSIS — R52 Pain, unspecified: Secondary | ICD-10-CM

## 2013-12-26 DIAGNOSIS — E876 Hypokalemia: Secondary | ICD-10-CM

## 2013-12-26 DIAGNOSIS — R110 Nausea: Secondary | ICD-10-CM

## 2013-12-26 DIAGNOSIS — C7A00 Malignant carcinoid tumor of unspecified site: Secondary | ICD-10-CM

## 2013-12-26 NOTE — Telephone Encounter (Signed)
gv and printed appt sched and avs for pt for Feb 2015 °

## 2013-12-26 NOTE — Progress Notes (Signed)
[place]    OFFICE PROGRESS NOTE   INTERVAL HISTORY:   Lori Dennis returns for scheduled followup of metastatic carcinoid tumor. She reports less nausea. She was very active around Christmas holiday. She developed foot swelling when she was on her feet. This has improved. The abdominal pain is stable. She is able to clamp the gastrostomy tube. The tube became clogged. This was relieved with flushing. She continues nighttime TPN.  Objective:  Vital signs in last 24 hours:  Blood pressure 153/84, pulse 82, temperature 97.6 F (36.4 C), temperature source Oral, resp. rate 19, height 5\' 3"  (1.6 m), weight 124 lb 14.4 oz (56.654 kg).    HEENT: mild whitecoat over the tongue Resp: lungs clear bilaterally Cardio: regular rate and rhythm GI: firm fullness in the right abdomen, gastrostomy tube site without evidence of infection, left lower quadrant colostomy Vascular: no leg edema  Portacath/PICC-without erythema    Medications: I have reviewed the patient's current medications.  Assessment/Plan: 1.Metastatic low grade carcinoid tumor, s/p 6 cycles of xeloda/temozolamide beginnning 09/06/2010.  -The chromagranin A and 24 hour Urine 5-HIAA were not significantly changed on 07/26/2011.  -Restaging CT 06/26/2012 with overall stable disease compared to a CT from June of 2011  -She began a trial of sunitinib on approximately 05/22/2013  2. Admission with SBO/SB infarction 06/08/2010, s/p resection of small bowel and colon.  3. S/p sigmoid colectomy/colostomy, right colectomy, and gastrojejunostomy in April 2009.  4. Elevated pre-operative 24-hour urine 5-HIAA.  5. Acute N/V April 2011, likely related to a partial small bowel obstruction due to tumor. Symptoms resolved with bowel rest and a liquid diet.  6. Intermittent loose stools-? Secondary to bowel resection or carcinoid. Partially improved with Sandostatin LAR. She is no longer on Sandostatin.  7. Intermittent  episodes of nausea and right buttock pain, resolving spontaneously-? related to carcinomatosis.  8. Right scalp herpes zoster rash July 2013  10. Admission with a small bowel obstruction 12/19/2012-status post NG tube decompression/bowel rest, CT the abdomen on 12/18/2012 revealed dilated small bowel loops and stomach, mild progression of capsular liver lesions, and no significant change in the pelvic masses . She continues to have intermittent obstructive symptoms. The nausea initially improved with low-dose prednisone, but she developed progressive nausea with intermittent emesis.  11. Elevated BUN and creatinine 01/26/2013-likely secondary to dehydration, a renal ultrasound on 01/24/2013 negative for hydronephrosis, the BUN/creatinine were improved after receiving intravenous fluids.  12. Port-A-Cath placement 02/06/2013.  13. Nutrition. She is maintained on TPN.  14. Persistent nausea and vomiting-likely secondary to carcinomatosis with intermittent bowel obstruction or a direct nausea effect from the cancer.  -Status post placement of a palliative gastrostomy tube on 07/24/2013 , the nausea has improved  -CT 06/24/2013 and upper endoscopy 06/28/2013 consistent with a proximal small bowel obstruction  15. Hypokalemia secondary to diarrhea and emesis-the potassium is been repleted in the home TNA  16. History of an Elevated CO2-likely related to dehydration and contraction alkalosis  17. Erythema at the Port-A-Cath site 09/07/2013-improved following a course of Levaquin  18. Anemia-the hemoglobin has remained between 9 and 10 over the past month    Disposition:  Lori Dennis appears stable. The plan is to continue home TNA. She will use oxycodone as needed for pain. The Port-A-Cath needle is changed weekly by the home care RN. We check electrolytes weekly. She will return for an office visit 01/31/2014. We decided to continue supportive care/observation for the metastatic carcinoid  tumor.  Lori Coder, MD  12/26/2013  5:40 PM

## 2013-12-26 NOTE — Telephone Encounter (Signed)
gv and pritned appt sched and avs forpt for Feb 2015   °

## 2014-01-01 ENCOUNTER — Encounter: Payer: Self-pay | Admitting: *Deleted

## 2014-01-01 NOTE — Progress Notes (Signed)
Received labs from [place]: BUN 42/Creat 1.43 (stable). Her Hgb is 10.1--copy to MD desk.

## 2014-01-07 ENCOUNTER — Other Ambulatory Visit: Payer: Self-pay | Admitting: *Deleted

## 2014-01-07 ENCOUNTER — Telehealth: Payer: Self-pay | Admitting: *Deleted

## 2014-01-07 ENCOUNTER — Telehealth: Payer: Self-pay | Admitting: Oncology

## 2014-01-07 DIAGNOSIS — D3A098 Benign carcinoid tumors of other sites: Secondary | ICD-10-CM

## 2014-01-07 NOTE — Telephone Encounter (Signed)
S/w tiffany from ir regarding the appt and she will call the pt's dtr with the appt.

## 2014-01-07 NOTE — Telephone Encounter (Signed)
Per Dr. Benay Spice : Refer to interventional radiology to assess/treat if possible. Order in [place].

## 2014-01-07 NOTE — Telephone Encounter (Signed)
Reports there is overgrowth of very vascular looking tissue around her G-tube. Asking for someone to look at it. Confirmed tube was placed by Dr. Vernard Gambles in IR department of radiology. Will arrange for appointment with IR to evaluate her. Daughter prefers am and not Wednesday this week so she can come with her. Also received labs from [facility] dated 01/06/14: Hgb 12.0/ BUN 41/ Creat 1.45 (stable). Forwarded to MD.

## 2014-01-09 ENCOUNTER — Other Ambulatory Visit: Payer: Self-pay | Admitting: Radiology

## 2014-01-11 ENCOUNTER — Other Ambulatory Visit: Payer: Self-pay | Admitting: Oncology

## 2014-01-11 ENCOUNTER — Ambulatory Visit (HOSPITAL_COMMUNITY)
Admission: RE | Admit: 2014-01-11 | Discharge: 2014-01-11 | Disposition: A | Discharge status: Home / Self Care | Payer: Medicare Other | Source: Ambulatory Visit | Attending: Oncology | Admitting: Oncology

## 2014-01-11 DIAGNOSIS — D3A098 Benign carcinoid tumors of other sites: Secondary | ICD-10-CM

## 2014-01-15 ENCOUNTER — Encounter: Payer: Self-pay | Admitting: Oncology

## 2014-01-24 ENCOUNTER — Telehealth: Payer: Self-pay | Admitting: *Deleted

## 2014-01-24 DIAGNOSIS — B370 Candidal stomatitis: Secondary | ICD-10-CM

## 2014-01-24 MED ORDER — FLUCONAZOLE 50 MG PO TABS: 50.0000 mg | ORAL_TABLET | Freq: Every day | ORAL | Status: DC

## 2014-01-24 NOTE — Telephone Encounter (Signed)
Thinks Malayla has thrush again-mouth has thick white secretions and crusting. Diflucan made her feel better last time she had these symptoms. Asking if MD willing to order? OK per Dr. Benay Spice.

## 2014-01-25 ENCOUNTER — Encounter: Payer: Self-pay | Admitting: *Deleted

## 2014-01-25 NOTE — Progress Notes (Signed)
Received labs from [facility] : Hgb 10.2, Creat 1.45, BUN 35--stable. Copy to MD desk.

## 2014-01-29 ENCOUNTER — Encounter: Payer: Self-pay | Admitting: Oncology

## 2014-01-31 ENCOUNTER — Ambulatory Visit (HOSPITAL_BASED_OUTPATIENT_CLINIC_OR_DEPARTMENT_OTHER): Payer: Medicare Other | Admitting: Oncology

## 2014-01-31 ENCOUNTER — Telehealth: Payer: Self-pay | Admitting: Oncology

## 2014-01-31 VITALS — BP 164/83 | HR 81 | Temp 98.0°F | Resp 18 | Ht 63.0 in | Wt 125.0 lb

## 2014-01-31 DIAGNOSIS — E876 Hypokalemia: Secondary | ICD-10-CM

## 2014-01-31 DIAGNOSIS — D3A098 Benign carcinoid tumors of other sites: Secondary | ICD-10-CM

## 2014-01-31 DIAGNOSIS — D490 Neoplasm of unspecified behavior of digestive system: Secondary | ICD-10-CM

## 2014-01-31 DIAGNOSIS — D649 Anemia, unspecified: Secondary | ICD-10-CM

## 2014-01-31 NOTE — Telephone Encounter (Signed)
Gave pt appt for MD for March 2015

## 2014-01-31 NOTE — Progress Notes (Signed)
[place]    OFFICE PROGRESS NOTE   INTERVAL HISTORY:   She returns for scheduled followup of metastatic carcinoid tumor. She reports an improved energy level. She continues home TNA. No problem with the Port-A-Cath. She eats and drinks, most of the oral intake comes out the gastrostomy tube. There is some output from the colostomy bag. She developed a whitecoat in the mouth and took Diflucan. This helped. Oxycodone helps the abdominal pain, but she does not take this often. She is able to clamp the gastrostomy tube when she is out of the house.  Objective:  Vital signs in last 24 hours:  Blood pressure 164/83, pulse 81, temperature 98 F (36.7 C), temperature source Oral, resp. rate 18, height 5\' 3"  (1.6 m), weight 125 lb (56.7 kg).    HEENT: Mild whitecoat over the tongue, no buccal thrush Resp: Lungs clear bilaterally Cardio: Regular rate and rhythm GI: No hepatomegaly, gastrostomy tube site without evidence of infection, left lower quadrant colostomy bag with a small amount of liquid stool, firm fullness in the right abdomen Vascular: Trace pitting edema at the left greater than right ankle   Portacath/PICC-without erythema  Lab Results:  We monitor weekly labs via home nursing.   Medications: I have reviewed the patient's current medications.  Assessment/Plan: 1.Metastatic low grade carcinoid tumor, s/p 6 cycles of xeloda/temozolamide beginnning 09/06/2010.  -The chromagranin A and 24 hour Urine 5-HIAA were not significantly changed on 07/26/2011.  -Restaging CT 06/26/2012 with overall stable disease compared to a CT from June of 2011  -She began a trial of sunitinib on approximately 05/22/2013  2. Admission with SBO/SB infarction 06/08/2010, s/p resection of small bowel and colon.  3. S/p sigmoid colectomy/colostomy, right colectomy, and gastrojejunostomy in April 2009.  4. Elevated pre-operative 24-hour urine 5-HIAA.  5. Acute N/V April 2011, likely  related to a partial small bowel obstruction due to tumor. Symptoms resolved with bowel rest and a liquid diet.  6. Intermittent loose stools-? Secondary to bowel resection or carcinoid. Partially improved with Sandostatin LAR. She is no longer on Sandostatin.  7. Intermittent episodes of nausea and right buttock pain, resolving spontaneously-? related to carcinomatosis.  8. Right scalp herpes zoster rash July 2013  10. Admission with a small bowel obstruction 12/19/2012-status post NG tube decompression/bowel rest, CT the abdomen on 12/18/2012 revealed dilated small bowel loops and stomach, mild progression of capsular liver lesions, and no significant change in the pelvic masses . She continues to have intermittent obstructive symptoms. The nausea initially improved with low-dose prednisone, but she developed progressive nausea with intermittent emesis.  11. Elevated BUN and creatinine 01/26/2013-likely secondary to dehydration, a renal ultrasound on 01/24/2013 negative for hydronephrosis, the BUN/creatinine were improved after receiving intravenous fluids.  12. Port-A-Cath placement 02/06/2013.  13. Nutrition. She is maintained on TPN.  14. Persistent nausea and vomiting-likely secondary to carcinomatosis with intermittent bowel obstruction or a direct nausea effect from the cancer.  -Status post placement of a palliative gastrostomy tube on 07/24/2013 , the nausea has improved  -CT 06/24/2013 and upper endoscopy 06/28/2013 consistent with a proximal small bowel obstruction  15. Hypokalemia secondary to diarrhea and emesis-the potassium is been repleted in the home TNA  16. History of an Elevated CO2-likely related to dehydration and contraction alkalosis  17. Erythema at the Port-A-Cath site 09/07/2013-improved following a course of Levaquin  18. Anemia-likely secondary to chronic disease   Disposition:  Ms. Harewood appears stable. She will continue home TNA and  as needed IV fluids. She will  return for an office visit in 6 weeks.   Lori Coder, MD  01/31/2014  9:47 AM

## 2014-02-15 ENCOUNTER — Encounter: Payer: Self-pay | Admitting: Oncology

## 2014-02-15 ENCOUNTER — Encounter: Payer: Self-pay | Admitting: *Deleted

## 2014-02-15 NOTE — Progress Notes (Signed)
Received labs from [facility] per [facility] weekly draw: Hgb 9.7 (was 10.2 on 2/6). Creatinine 1.89 (was 1.45) and BUN 24 (was 35). Results to MD desk.

## 2014-03-01 ENCOUNTER — Encounter: Payer: Self-pay | Admitting: Oncology

## 2014-03-05 ENCOUNTER — Telehealth: Payer: Self-pay | Admitting: *Deleted

## 2014-03-05 NOTE — Telephone Encounter (Signed)
Received message from pt stating that "I have blood in my urine and the Home RN tested it and is sending it out for culture; wanted Dr. Benay Spice to be aware"  Pt also stated that they started her on an antibiotic for 5 days as well.  Dr. Benay Spice made aware of information.  Returned call to pt and let her know MD aware; she states that she still has blood in urine and culture is not back yet.  Pt encouraged to keep drinking lots of fluids and Home RN should call when cultures come back.  Pt states they will be back at the end of this week.  Confirmed appt in this office for 3/26 @ 9:30 and instructed to call if any problems.  Pt verbalized understanding.

## 2014-03-08 ENCOUNTER — Telehealth: Payer: Self-pay | Admitting: *Deleted

## 2014-03-08 NOTE — Telephone Encounter (Signed)
Hematuria has returned since last week when U/A & culture was sent. On call MD put her on 5 days of Septra. Collected sample again today and requesting orders for lab again. Gave OK for lab. Called [facility] and requested last week urine reports to be faxed to office.

## 2014-03-08 NOTE — Telephone Encounter (Signed)
Obtained U/A culture results. Positive for blood, but only 40,000 colonies found-nothing predominant. Per Dr. Alda Berthold related to her cancer and not UTI. Only option would to be to refer her to urologist-may need cysto.

## 2014-03-12 ENCOUNTER — Encounter: Payer: Self-pay | Admitting: Oncology

## 2014-03-12 ENCOUNTER — Telehealth: Payer: Self-pay | Admitting: *Deleted

## 2014-03-12 NOTE — Telephone Encounter (Signed)
Wanted MD aware that BUN/creatinine is going up again : 03/08/14 result 28 and 2.03. They will be increasing the volume of her TNA fluid. Wanted MD aware. Dr. Benay Spice agrees to this and encourages she continue to receive her extra saline hydration also. He was aware of results. Also noted U/A is still positive for blood with only 45,000 colonies on culture-thinks hematuria is related to her cancer-could be tumor invading bladder. He will discuss in detail at visit this week. Will refer to urology if patient wishes. Notified her daughter, [name] about results. They will discuss with MD on Thursday.

## 2014-03-13 ENCOUNTER — Other Ambulatory Visit: Payer: Self-pay | Admitting: *Deleted

## 2014-03-13 DIAGNOSIS — D3A098 Benign carcinoid tumors of other sites: Secondary | ICD-10-CM

## 2014-03-14 ENCOUNTER — Telehealth: Payer: Self-pay | Admitting: *Deleted

## 2014-03-14 ENCOUNTER — Telehealth: Payer: Self-pay | Admitting: Oncology

## 2014-03-14 ENCOUNTER — Other Ambulatory Visit (HOSPITAL_BASED_OUTPATIENT_CLINIC_OR_DEPARTMENT_OTHER): Payer: Medicare Other

## 2014-03-14 ENCOUNTER — Ambulatory Visit (HOSPITAL_BASED_OUTPATIENT_CLINIC_OR_DEPARTMENT_OTHER): Payer: Medicare Other | Admitting: Oncology

## 2014-03-14 ENCOUNTER — Ambulatory Visit (HOSPITAL_COMMUNITY)
Admission: RE | Admit: 2014-03-14 | Discharge: 2014-03-14 | Disposition: A | Discharge status: Home / Self Care | Payer: Medicare Other | Source: Ambulatory Visit | Attending: Oncology | Admitting: Oncology

## 2014-03-14 VITALS — BP 175/78 | HR 81 | Temp 97.8°F | Resp 18 | Ht 63.0 in | Wt 127.5 lb

## 2014-03-14 DIAGNOSIS — C7A00 Malignant carcinoid tumor of unspecified site: Secondary | ICD-10-CM

## 2014-03-14 DIAGNOSIS — D3A098 Benign carcinoid tumors of other sites: Secondary | ICD-10-CM

## 2014-03-14 DIAGNOSIS — M7989 Other specified soft tissue disorders: Secondary | ICD-10-CM

## 2014-03-14 DIAGNOSIS — D490 Neoplasm of unspecified behavior of digestive system: Secondary | ICD-10-CM | POA: Diagnosis present

## 2014-03-14 DIAGNOSIS — C7B8 Other secondary neuroendocrine tumors: Secondary | ICD-10-CM

## 2014-03-14 LAB — CBC WITH DIFFERENTIAL/PLATELET
BASO%: 0.6 % (ref 0.0–2.0)
BASOS ABS: 0 10*3/uL (ref 0.0–0.1)
EOS%: 1.1 % (ref 0.0–7.0)
Eosinophils Absolute: 0.1 10*3/uL (ref 0.0–0.5)
HCT: 30.4 % — ABNORMAL LOW (ref 34.8–46.6)
HEMOGLOBIN: 9.8 g/dL — AB (ref 11.6–15.9)
LYMPH#: 1.5 10*3/uL (ref 0.9–3.3)
LYMPH%: 21.9 % (ref 14.0–49.7)
MCH: 28.9 pg (ref 25.1–34.0)
MCHC: 32.4 g/dL (ref 31.5–36.0)
MCV: 88.9 fL (ref 79.5–101.0)
MONO#: 0.3 10*3/uL (ref 0.1–0.9)
MONO%: 4.8 % (ref 0.0–14.0)
NEUT#: 5 10*3/uL (ref 1.5–6.5)
NEUT%: 71.6 % (ref 38.4–76.8)
Platelets: 269 10*3/uL (ref 145–400)
RBC: 3.41 10*6/uL — ABNORMAL LOW (ref 3.70–5.45)
RDW: 14.3 % (ref 11.2–14.5)
WBC: 6.9 10*3/uL (ref 3.9–10.3)

## 2014-03-14 NOTE — Progress Notes (Signed)
[facility] OFFICE PROGRESS NOTE   Diagnosis: Metastatic carcinoid tumor  INTERVAL HISTORY:   Lori Dennis returns as scheduled. She had intermittent hematuria beginning approximately 2 weeks ago. She describes bloody urine and clots. She was treated for a urinary tract infection. No hematuria at present.  She reports stable abdominal pain. She is drinking fluids. She is able to clamp the gastrostomy tube 4 hours. There is some output in the colostomy bag. The creatinine was higher on 03/08/2014 and she increased the IV fluids.  Objective:  Vital signs in last 24 hours:  Blood pressure 175/78, pulse 81, temperature 97.8 F (36.6 C), temperature source Oral, resp. rate 18, height 5\' 3"  (1.6 m), weight 127 lb 8 oz (57.834 kg), SpO2 99.00%.    HEENT: Mild whitecoat over the tongue Resp: Lungs clear bilaterally Cardio: Regular rate and rhythm GI: Left abdomen colostomy with a small amount of tan liquid, firmness in the right abdomen Vascular: Trace edema throughout the left leg  Portacath/PICC-without erythema  Lab Results:  Lab Results  Component Value Date   WBC 6.9 03/14/2014   HGB 9.8* 03/14/2014   HCT 30.4* 03/14/2014   MCV 88.9 03/14/2014   PLT 269 03/14/2014   NEUTROABS 5.0 03/14/2014    03/08/2014-BUN 28, creatinine 2.03   Medications: I have reviewed the patient's current medications.  Assessment/Plan: 1.Metastatic low grade carcinoid tumor, s/p 6 cycles of xeloda/temozolamide beginnning 09/06/2010.  -The chromagranin A and 24 hour Urine 5-HIAA were not significantly changed on 07/26/2011.  -Restaging CT 06/26/2012 with overall stable disease compared to a CT from June of 2011  -She began a trial of sunitinib on approximately 05/22/2013  2. Admission with SBO/SB infarction 06/08/2010, s/p resection of small bowel and colon.  3. S/p sigmoid colectomy/colostomy, right colectomy, and gastrojejunostomy in April 2009.  4. Elevated pre-operative 24-hour urine  5-HIAA.  5. Acute N/V April 2011, likely related to a partial small bowel obstruction due to tumor. Symptoms resolved with bowel rest and a liquid diet.  6. Intermittent loose stools-? Secondary to bowel resection or carcinoid. Partially improved with Sandostatin LAR. She is no longer on Sandostatin.  7. Intermittent episodes of nausea and right buttock pain, resolving spontaneously-? related to carcinomatosis.  8. Right scalp herpes zoster rash July 2013  10. Admission with a small bowel obstruction 12/19/2012-status post NG tube decompression/bowel rest, CT the abdomen on 12/18/2012 revealed dilated small bowel loops and stomach, mild progression of capsular liver lesions, and no significant change in the pelvic masses . She continues to have intermittent obstructive symptoms. The nausea initially improved with low-dose prednisone, but she developed progressive nausea with intermittent emesis.  11. Elevated BUN and creatinine 01/26/2013-likely secondary to dehydration, a renal ultrasound on 01/24/2013 negative for hydronephrosis, the BUN/creatinine were improved after receiving intravenous fluids.  12. Port-A-Cath placement 02/06/2013.  13. Nutrition. She is maintained on TPN.  14. Persistent nausea and vomiting-likely secondary to carcinomatosis with intermittent bowel obstruction or a direct nausea effect from the cancer.  -Status post placement of a palliative gastrostomy tube on 07/24/2013 , the nausea has improved  -CT 06/24/2013 and upper endoscopy 06/28/2013 consistent with a proximal small bowel obstruction  15. Hypokalemia secondary to diarrhea and emesis-the potassium is been repleted in the home TNA  16. History of an Elevated CO2-likely related to dehydration and contraction alkalosis  17. Erythema at the Port-A-Cath site 09/07/2013-improved following a course of Levaquin  18. Anemia-likely secondary to chronic disease and recent hematuria 19. Left leg  edema-negative left leg Doppler  11/03/2012 20. Gross hematuria-potentially related to tumor involving the bladder or upper urinary tract   Disposition:  Her overall status appears unchanged. The plan is to continue home TNA and IV fluids. A chemistry panel will be checked by the home nurse tomorrow.  We discussed the gross hematuria. The plan is to make a urology referral if she has recurrent hematuria. Lori Dennis will be referred for a left leg Doppler today.  She will return for an office visit in 6 weeks.  Betsy Coder, MD  03/14/2014  6:13 PM

## 2014-03-14 NOTE — Progress Notes (Signed)
*  PRELIMINARY RESULTS* Vascular Ultrasound Left lower extremity venous duplex has been completed.  Preliminary findings: Left:  No evidence of DVT, superficial thrombosis, or Baker's cyst.  Attempted call report to Dr. Gearldine Shown office. Left voice message with results. Let patient leave.  Landry Mellow, RDMS, RVT  03/14/2014, 12:21 PM

## 2014-03-14 NOTE — Telephone Encounter (Signed)
gv adn printed aptp sched and avs for pt fro March adn May

## 2014-03-14 NOTE — Telephone Encounter (Signed)
Received call report from vascular lab. Pt is negative for DVT in LLE. Will make Dr. Benay Spice aware.

## 2014-03-25 ENCOUNTER — Encounter: Payer: Self-pay | Admitting: *Deleted

## 2014-03-25 NOTE — Progress Notes (Signed)
[facility] labs from 03/22/14 received today:  Hgb 9.1, BUN 28/creatinine 1.83 (improved since 03/08/14 result). To MD for review.

## 2014-03-26 ENCOUNTER — Telehealth: Payer: Self-pay | Admitting: *Deleted

## 2014-03-26 NOTE — Telephone Encounter (Signed)
Wanted to ensure MD aware of persistent elevation BUN/creatinine. Attempted to increase volume of TPN, but patient felt it caused more N/V, so it has been backed back down to 2000 ml/day. Had only taken her extra IVF once last week. She does not like being tied down to fluids. They have encouraged her to increase the frequency of her IV fluids this week. Does MD have any other suggestions?

## 2014-03-29 ENCOUNTER — Telehealth: Payer: Self-pay | Admitting: *Deleted

## 2014-03-29 NOTE — Telephone Encounter (Signed)
Pt called reports that she is having blood in urine again "Dr. Benay Spice wanted me to call you if this started happening again"  Pt states she thinks Dr. Benay Spice will send her to urologist; states "its not as red but every time I've gone today (3-4 times) its been pink"  Informed pt Dr. Benay Spice is out of office today but will be notified.  Pt verbalized understanding and expressed appreciation for call back.  Note to Dr. Benay Spice.

## 2014-04-01 ENCOUNTER — Encounter: Payer: Self-pay | Admitting: *Deleted

## 2014-04-01 ENCOUNTER — Other Ambulatory Visit: Payer: Self-pay | Admitting: *Deleted

## 2014-04-01 DIAGNOSIS — D3A098 Benign carcinoid tumors of other sites: Secondary | ICD-10-CM

## 2014-04-01 NOTE — Progress Notes (Signed)
[facility] labs: BUN 29/creatinine 1.79 (stable compared to 03/22/14 labs). Hgb 9.3. Forwarded to MD

## 2014-04-01 NOTE — Progress Notes (Signed)
Dr. Benay Spice agreed to urology referral with 1st available at [facility]. Patient notified.  Referral placed and POF sent

## 2014-04-03 ENCOUNTER — Telehealth: Payer: Self-pay | Admitting: Oncology

## 2014-04-03 NOTE — Telephone Encounter (Signed)
s.w.l pt and advised on April 27 appt @ 3:30pm...pt ok adn aware

## 2014-04-03 NOTE — Telephone Encounter (Signed)
Faxed pt medical records to Dr. Wille Glaser

## 2014-04-08 ENCOUNTER — Encounter (HOSPITAL_COMMUNITY): Payer: Self-pay | Admitting: Anesthesiology

## 2014-04-08 ENCOUNTER — Ambulatory Visit (HOSPITAL_BASED_OUTPATIENT_CLINIC_OR_DEPARTMENT_OTHER): Payer: Medicare Other

## 2014-04-08 ENCOUNTER — Other Ambulatory Visit: Payer: Self-pay | Admitting: *Deleted

## 2014-04-08 ENCOUNTER — Encounter (HOSPITAL_COMMUNITY)
Admission: AD | Disposition: A | Discharge status: Home Health | Payer: Self-pay | Source: Ambulatory Visit | Attending: Internal Medicine

## 2014-04-08 ENCOUNTER — Ambulatory Visit (HOSPITAL_COMMUNITY)
Admission: RE | Admit: 2014-04-08 | Discharge: 2014-04-08 | Disposition: A | Discharge status: Home / Self Care | Payer: Medicare Other | Source: Ambulatory Visit | Attending: Oncology | Admitting: Oncology

## 2014-04-08 ENCOUNTER — Other Ambulatory Visit: Payer: Self-pay | Admitting: Oncology

## 2014-04-08 ENCOUNTER — Encounter (HOSPITAL_COMMUNITY): Payer: Medicare Other | Admitting: Anesthesiology

## 2014-04-08 ENCOUNTER — Ambulatory Visit (HOSPITAL_BASED_OUTPATIENT_CLINIC_OR_DEPARTMENT_OTHER): Payer: Medicare Other | Admitting: Oncology

## 2014-04-08 ENCOUNTER — Other Ambulatory Visit (HOSPITAL_COMMUNITY): Payer: Self-pay | Admitting: General Practice

## 2014-04-08 ENCOUNTER — Telehealth: Payer: Self-pay | Admitting: *Deleted

## 2014-04-08 ENCOUNTER — Inpatient Hospital Stay (HOSPITAL_COMMUNITY)
Admission: AD | Admit: 2014-04-08 | Discharge: 2014-04-10 | DRG: 693 | Disposition: A | Discharge status: Home Health | Payer: Medicare Other | Source: Ambulatory Visit | Attending: Internal Medicine | Admitting: Internal Medicine

## 2014-04-08 ENCOUNTER — Other Ambulatory Visit (HOSPITAL_BASED_OUTPATIENT_CLINIC_OR_DEPARTMENT_OTHER): Payer: Medicare Other

## 2014-04-08 ENCOUNTER — Encounter (HOSPITAL_COMMUNITY): Payer: Self-pay

## 2014-04-08 ENCOUNTER — Inpatient Hospital Stay (HOSPITAL_COMMUNITY): Payer: Medicare Other | Admitting: Anesthesiology

## 2014-04-08 DIAGNOSIS — N200 Calculus of kidney: Secondary | ICD-10-CM | POA: Diagnosis present

## 2014-04-08 DIAGNOSIS — Z9221 Personal history of antineoplastic chemotherapy: Secondary | ICD-10-CM | POA: Diagnosis not present

## 2014-04-08 DIAGNOSIS — N201 Calculus of ureter: Secondary | ICD-10-CM | POA: Diagnosis present

## 2014-04-08 DIAGNOSIS — D630 Anemia in neoplastic disease: Secondary | ICD-10-CM | POA: Diagnosis present

## 2014-04-08 DIAGNOSIS — N1330 Unspecified hydronephrosis: Secondary | ICD-10-CM | POA: Diagnosis present

## 2014-04-08 DIAGNOSIS — K56609 Unspecified intestinal obstruction, unspecified as to partial versus complete obstruction: Secondary | ICD-10-CM | POA: Diagnosis present

## 2014-04-08 DIAGNOSIS — C7B8 Other secondary neuroendocrine tumors: Secondary | ICD-10-CM

## 2014-04-08 DIAGNOSIS — N179 Acute kidney failure, unspecified: Secondary | ICD-10-CM

## 2014-04-08 DIAGNOSIS — Z933 Colostomy status: Secondary | ICD-10-CM

## 2014-04-08 DIAGNOSIS — D3A098 Benign carcinoid tumors of other sites: Secondary | ICD-10-CM

## 2014-04-08 DIAGNOSIS — C786 Secondary malignant neoplasm of retroperitoneum and peritoneum: Secondary | ICD-10-CM | POA: Diagnosis present

## 2014-04-08 DIAGNOSIS — Z79899 Other long term (current) drug therapy: Secondary | ICD-10-CM

## 2014-04-08 DIAGNOSIS — C7A00 Malignant carcinoid tumor of unspecified site: Secondary | ICD-10-CM | POA: Diagnosis present

## 2014-04-08 DIAGNOSIS — I129 Hypertensive chronic kidney disease with stage 1 through stage 4 chronic kidney disease, or unspecified chronic kidney disease: Secondary | ICD-10-CM | POA: Diagnosis present

## 2014-04-08 DIAGNOSIS — N049 Nephrotic syndrome with unspecified morphologic changes: Secondary | ICD-10-CM | POA: Diagnosis present

## 2014-04-08 DIAGNOSIS — IMO0002 Reserved for concepts with insufficient information to code with codable children: Secondary | ICD-10-CM

## 2014-04-08 DIAGNOSIS — Z8249 Family history of ischemic heart disease and other diseases of the circulatory system: Secondary | ICD-10-CM

## 2014-04-08 DIAGNOSIS — G893 Neoplasm related pain (acute) (chronic): Secondary | ICD-10-CM

## 2014-04-08 DIAGNOSIS — N135 Crossing vessel and stricture of ureter without hydronephrosis: Secondary | ICD-10-CM | POA: Diagnosis present

## 2014-04-08 DIAGNOSIS — J90 Pleural effusion, not elsewhere classified: Secondary | ICD-10-CM | POA: Diagnosis present

## 2014-04-08 DIAGNOSIS — E43 Unspecified severe protein-calorie malnutrition: Secondary | ICD-10-CM | POA: Diagnosis present

## 2014-04-08 DIAGNOSIS — E870 Hyperosmolality and hypernatremia: Secondary | ICD-10-CM | POA: Diagnosis present

## 2014-04-08 DIAGNOSIS — R310 Gross hematuria: Secondary | ICD-10-CM

## 2014-04-08 DIAGNOSIS — Z9049 Acquired absence of other specified parts of digestive tract: Secondary | ICD-10-CM

## 2014-04-08 DIAGNOSIS — N2889 Other specified disorders of kidney and ureter: Secondary | ICD-10-CM | POA: Diagnosis present

## 2014-04-08 DIAGNOSIS — N182 Chronic kidney disease, stage 2 (mild): Secondary | ICD-10-CM | POA: Diagnosis present

## 2014-04-08 DIAGNOSIS — I10 Essential (primary) hypertension: Secondary | ICD-10-CM

## 2014-04-08 DIAGNOSIS — R112 Nausea with vomiting, unspecified: Secondary | ICD-10-CM

## 2014-04-08 DIAGNOSIS — D638 Anemia in other chronic diseases classified elsewhere: Secondary | ICD-10-CM

## 2014-04-08 DIAGNOSIS — D490 Neoplasm of unspecified behavior of digestive system: Secondary | ICD-10-CM

## 2014-04-08 DIAGNOSIS — E860 Dehydration: Secondary | ICD-10-CM

## 2014-04-08 HISTORY — PX: CYSTOSCOPY WITH RETROGRADE PYELOGRAM, URETEROSCOPY AND STENT PLACEMENT: SHX5789

## 2014-04-08 LAB — COMPREHENSIVE METABOLIC PANEL (CC13)
ALBUMIN: 2.6 g/dL — AB (ref 3.5–5.0)
ALT: 14 U/L (ref 0–55)
ANION GAP: 12 meq/L — AB (ref 3–11)
AST: 24 U/L (ref 5–34)
Alkaline Phosphatase: 249 U/L — ABNORMAL HIGH (ref 40–150)
BUN: 68.5 mg/dL — ABNORMAL HIGH (ref 7.0–26.0)
CALCIUM: 10.2 mg/dL (ref 8.4–10.4)
CO2: 23 meq/L (ref 22–29)
Chloride: 114 mEq/L — ABNORMAL HIGH (ref 98–109)
Creatinine: 5.4 mg/dL (ref 0.6–1.1)
Glucose: 150 mg/dl — ABNORMAL HIGH (ref 70–140)
POTASSIUM: 4.9 meq/L (ref 3.5–5.1)
SODIUM: 150 meq/L — AB (ref 136–145)
TOTAL PROTEIN: 7.4 g/dL (ref 6.4–8.3)
Total Bilirubin: 0.62 mg/dL (ref 0.20–1.20)

## 2014-04-08 LAB — CBC
HCT: 27.3 % — ABNORMAL LOW (ref 36.0–46.0)
Hemoglobin: 8.9 g/dL — ABNORMAL LOW (ref 12.0–15.0)
MCH: 29.3 pg (ref 26.0–34.0)
MCHC: 32.6 g/dL (ref 30.0–36.0)
MCV: 89.8 fL (ref 78.0–100.0)
PLATELETS: 239 10*3/uL (ref 150–400)
RBC: 3.04 MIL/uL — AB (ref 3.87–5.11)
RDW: 15.6 % — ABNORMAL HIGH (ref 11.5–15.5)
WBC: 8.7 10*3/uL (ref 4.0–10.5)

## 2014-04-08 LAB — SURGICAL PCR SCREEN
MRSA, PCR: NEGATIVE
Staphylococcus aureus: POSITIVE — AB

## 2014-04-08 LAB — CREATININE, SERUM
Creatinine, Ser: 5.82 mg/dL — ABNORMAL HIGH (ref 0.50–1.10)
GFR calc Af Amer: 8 mL/min — ABNORMAL LOW (ref >90)
GFR calc non Af Amer: 7 mL/min — ABNORMAL LOW (ref >90)

## 2014-04-08 SURGERY — CYSTOURETEROSCOPY, WITH RETROGRADE PYELOGRAM AND STENT INSERTION
Anesthesia: General | Site: Ureter | Laterality: Bilateral

## 2014-04-08 SURGERY — CYSTOSCOPY, WITH STENT INSERTION
Anesthesia: Choice | Laterality: Bilateral

## 2014-04-08 MED ORDER — HYDRALAZINE HCL 20 MG/ML IJ SOLN
10.0000 mg | INTRAMUSCULAR | Status: DC | PRN
Start: 2014-04-08 — End: 2014-04-10
  Filled 2014-04-08: qty 0.5

## 2014-04-08 MED ORDER — MIDAZOLAM HCL 2 MG/2ML IJ SOLN
INTRAMUSCULAR | Status: AC
  Filled 2014-04-08: qty 2

## 2014-04-08 MED ORDER — HEPARIN SODIUM (PORCINE) 5000 UNIT/ML IJ SOLN
5000.0000 [IU] | Freq: Three times a day (TID) | INTRAMUSCULAR | Status: DC
  Administered 2014-04-09: 5000 [IU] via SUBCUTANEOUS
  Filled 2014-04-08 (×5): qty 1

## 2014-04-08 MED ORDER — SODIUM CHLORIDE 0.9 % IR SOLN
Status: DC | PRN
  Administered 2014-04-08: 1000 mL

## 2014-04-08 MED ORDER — BELLADONNA ALKALOIDS-OPIUM 16.2-60 MG RE SUPP
RECTAL | Status: AC
  Filled 2014-04-08: qty 1

## 2014-04-08 MED ORDER — SODIUM CHLORIDE 0.9 % IV SOLN
INTRAVENOUS | Status: DC
  Administered 2014-04-08 (×2): via INTRAVENOUS

## 2014-04-08 MED ORDER — ACETAMINOPHEN 650 MG RE SUPP: 650.0000 mg | Freq: Four times a day (QID) | RECTAL | Status: DC | PRN

## 2014-04-08 MED ORDER — ONDANSETRON HCL 4 MG/2ML IJ SOLN
4.0000 mg | Freq: Four times a day (QID) | INTRAMUSCULAR | Status: DC | PRN
  Administered 2014-04-09: 4 mg via INTRAVENOUS
  Filled 2014-04-08: qty 2

## 2014-04-08 MED ORDER — 0.9 % SODIUM CHLORIDE (POUR BTL) OPTIME
TOPICAL | Status: DC | PRN
  Administered 2014-04-08: 1000 mL

## 2014-04-08 MED ORDER — LABETALOL HCL 5 MG/ML IV SOLN
INTRAVENOUS | Status: AC
  Filled 2014-04-08: qty 4

## 2014-04-08 MED ORDER — ONDANSETRON HCL 4 MG/2ML IJ SOLN
INTRAMUSCULAR | Status: AC
  Filled 2014-04-08: qty 2

## 2014-04-08 MED ORDER — INSULIN ASPART 100 UNIT/ML ~~LOC~~ SOLN
0.0000 [IU] | Freq: Four times a day (QID) | SUBCUTANEOUS | Status: DC
Start: 2014-04-09 — End: 2014-04-09

## 2014-04-08 MED ORDER — SODIUM CHLORIDE 0.9 % IJ SOLN: 3.0000 mL | Freq: Two times a day (BID) | INTRAMUSCULAR | Status: DC

## 2014-04-08 MED ORDER — OXYCODONE HCL 20 MG/ML PO CONC
6.0000 mg | ORAL | Status: DC | PRN
  Administered 2014-04-09 (×3): 10 mg via ORAL
  Filled 2014-04-08 (×3): qty 1

## 2014-04-08 MED ORDER — SODIUM CHLORIDE 0.9 % IR SOLN
Status: DC | PRN
  Administered 2014-04-08: 3000 mL

## 2014-04-08 MED ORDER — DEXTROSE-NACL 5-0.45 % IV SOLN
INTRAVENOUS | Status: DC
  Administered 2014-04-09 – 2014-04-10 (×3): via INTRAVENOUS

## 2014-04-08 MED ORDER — LIDOCAINE HCL (CARDIAC) 20 MG/ML IV SOLN
INTRAVENOUS | Status: AC
  Filled 2014-04-08: qty 5

## 2014-04-08 MED ORDER — EPHEDRINE SULFATE 50 MG/ML IJ SOLN
INTRAMUSCULAR | Status: AC
  Filled 2014-04-08: qty 1

## 2014-04-08 MED ORDER — DEXAMETHASONE SODIUM PHOSPHATE 10 MG/ML IJ SOLN
INTRAMUSCULAR | Status: DC | PRN
  Administered 2014-04-08: 10 mg via INTRAVENOUS

## 2014-04-08 MED ORDER — CIPROFLOXACIN IN D5W 200 MG/100ML IV SOLN
200.0000 mg | INTRAVENOUS | Status: AC
  Administered 2014-04-08: 200 mg via INTRAVENOUS
  Filled 2014-04-08 (×2): qty 100

## 2014-04-08 MED ORDER — SUCCINYLCHOLINE CHLORIDE 20 MG/ML IJ SOLN
INTRAMUSCULAR | Status: DC | PRN
  Administered 2014-04-08: 80 mg via INTRAVENOUS

## 2014-04-08 MED ORDER — ONDANSETRON HCL 4 MG/2ML IJ SOLN
INTRAMUSCULAR | Status: DC | PRN
  Administered 2014-04-08 (×2): 2 mg via INTRAVENOUS

## 2014-04-08 MED ORDER — HYDROMORPHONE HCL PF 1 MG/ML IJ SOLN
0.5000 mg | INTRAMUSCULAR | Status: DC | PRN
  Administered 2014-04-09: 0.5 mg via INTRAVENOUS
  Filled 2014-04-08: qty 1

## 2014-04-08 MED ORDER — LIDOCAINE HCL 2 % EX GEL
CUTANEOUS | Status: AC
  Filled 2014-04-08: qty 10

## 2014-04-08 MED ORDER — FENTANYL CITRATE 0.05 MG/ML IJ SOLN: 25.0000 ug | INTRAMUSCULAR | Status: DC | PRN

## 2014-04-08 MED ORDER — IOHEXOL 300 MG/ML  SOLN
INTRAMUSCULAR | Status: DC | PRN
  Administered 2014-04-08: 10 mL

## 2014-04-08 MED ORDER — SODIUM CHLORIDE 0.9 % IJ SOLN
INTRAMUSCULAR | Status: AC
  Filled 2014-04-08: qty 10

## 2014-04-08 MED ORDER — SODIUM CHLORIDE 0.9 % IJ SOLN
10.0000 mL | INTRAMUSCULAR | Status: AC | PRN
  Administered 2014-04-08: 10 mL via INTRAVENOUS
  Filled 2014-04-08: qty 10

## 2014-04-08 MED ORDER — HEPARIN SOD (PORK) LOCK FLUSH 100 UNIT/ML IV SOLN
500.0000 [IU] | Freq: Once | INTRAVENOUS | Status: AC
  Administered 2014-04-08: 500 [IU] via INTRAVENOUS
  Filled 2014-04-08: qty 5

## 2014-04-08 MED ORDER — PHENAZOPYRIDINE HCL 100 MG PO TABS
100.0000 mg | ORAL_TABLET | Freq: Three times a day (TID) | ORAL | Status: DC | PRN
  Filled 2014-04-08 (×3): qty 1

## 2014-04-08 MED ORDER — PROPOFOL 10 MG/ML IV BOLUS
INTRAVENOUS | Status: AC
  Filled 2014-04-08: qty 20

## 2014-04-08 MED ORDER — BIOTENE DRY MOUTH MT LIQD: 15.0000 mL | OROMUCOSAL | Status: DC | PRN

## 2014-04-08 MED ORDER — FENTANYL CITRATE 0.05 MG/ML IJ SOLN
INTRAMUSCULAR | Status: AC
  Filled 2014-04-08: qty 5

## 2014-04-08 MED ORDER — MORPHINE SULFATE 4 MG/ML IJ SOLN
2.0000 mg | Freq: Once | INTRAMUSCULAR | Status: AC
  Administered 2014-04-08: 2 mg via INTRAVENOUS

## 2014-04-08 MED ORDER — PROMETHAZINE HCL 25 MG/ML IJ SOLN: 12.5000 mg | Freq: Four times a day (QID) | INTRAMUSCULAR | Status: DC | PRN

## 2014-04-08 MED ORDER — IOHEXOL 300 MG/ML  SOLN
50.0000 mL | Freq: Once | INTRAMUSCULAR | Status: AC | PRN
  Administered 2014-04-08: 50 mL via ORAL

## 2014-04-08 MED ORDER — LIDOCAINE HCL (CARDIAC) 20 MG/ML IV SOLN
INTRAVENOUS | Status: DC | PRN
  Administered 2014-04-08: 75 mg via INTRAVENOUS

## 2014-04-08 MED ORDER — DEXAMETHASONE SODIUM PHOSPHATE 10 MG/ML IJ SOLN
INTRAMUSCULAR | Status: AC
  Filled 2014-04-08: qty 1

## 2014-04-08 MED ORDER — FENTANYL CITRATE 0.05 MG/ML IJ SOLN
INTRAMUSCULAR | Status: DC | PRN
  Administered 2014-04-08: 50 ug via INTRAVENOUS

## 2014-04-08 MED ORDER — LABETALOL HCL 5 MG/ML IV SOLN
INTRAVENOUS | Status: DC | PRN
  Administered 2014-04-08 (×2): 2.5 mg via INTRAVENOUS
  Administered 2014-04-08: 5 mg via INTRAVENOUS

## 2014-04-08 MED ORDER — ACETAMINOPHEN 325 MG PO TABS: 650.0000 mg | ORAL_TABLET | Freq: Four times a day (QID) | ORAL | Status: DC | PRN

## 2014-04-08 MED ORDER — MORPHINE SULFATE 4 MG/ML IJ SOLN
INTRAMUSCULAR | Status: AC
  Filled 2014-04-08: qty 1

## 2014-04-08 MED ORDER — ONDANSETRON HCL 4 MG PO TABS
4.0000 mg | ORAL_TABLET | Freq: Four times a day (QID) | ORAL | Status: DC | PRN
  Administered 2014-04-09: 4 mg via ORAL
  Filled 2014-04-08: qty 1

## 2014-04-08 MED ORDER — PROPOFOL 10 MG/ML IV BOLUS
INTRAVENOUS | Status: DC | PRN
  Administered 2014-04-08: 140 mg via INTRAVENOUS
  Administered 2014-04-08: 60 mg via INTRAVENOUS

## 2014-04-08 MED ORDER — CIPROFLOXACIN IN D5W 200 MG/100ML IV SOLN
200.0000 mg | Freq: Two times a day (BID) | INTRAVENOUS | Status: AC
  Administered 2014-04-09: 200 mg via INTRAVENOUS
  Filled 2014-04-08: qty 100

## 2014-04-08 MED ORDER — SODIUM CHLORIDE 0.9 % IJ SOLN
10.0000 mL | INTRAMUSCULAR | Status: DC | PRN
  Administered 2014-04-09 – 2014-04-10 (×3): 10 mL

## 2014-04-08 SURGICAL SUPPLY — 17 items
BAG URO CATCHER STRL LF (DRAPE) ×3 IMPLANT
CLOTH BEACON ORANGE TIMEOUT ST (SAFETY) ×3 IMPLANT
DRAPE CAMERA CLOSED 9X96 (DRAPES) ×3 IMPLANT
GLOVE BIOGEL PI IND STRL 7.5 (GLOVE) ×1 IMPLANT
GLOVE BIOGEL PI INDICATOR 7.5 (GLOVE) ×2
GLOVE SURG SS PI 7.5 STRL IVOR (GLOVE) ×3 IMPLANT
GLOVE SURG SS PI 8.0 STRL IVOR (GLOVE) ×3 IMPLANT
GOWN STRL REUS W/TWL XL LVL3 (GOWN DISPOSABLE) ×6 IMPLANT
GUIDEWIRE ANG ZIPWIRE 038X150 (WIRE) ×3 IMPLANT
GUIDEWIRE STR DUAL SENSOR (WIRE) ×6 IMPLANT
IV NS IRRIG 3000ML ARTHROMATIC (IV SOLUTION) ×3 IMPLANT
MANIFOLD NEPTUNE II (INSTRUMENTS) ×3 IMPLANT
PACK CYSTO (CUSTOM PROCEDURE TRAY) ×3 IMPLANT
SHEATH ACCESS URETERAL 38CM (SHEATH) ×3 IMPLANT
STENT CONTOUR 6FRX24X.038 (STENTS) ×6 IMPLANT
TUBING CONNECTING 10 (TUBING) ×2 IMPLANT
TUBING CONNECTING 10' (TUBING) ×1

## 2014-04-08 NOTE — Progress Notes (Signed)
Brought to infusion area to await CT results and Cmet results. C/O abdominal pain that radiates to back-bilateral. Did not bring her pain med from home. Orders obtained for Morphine sulfate 2 mg IVP.

## 2014-04-08 NOTE — Transfer of Care (Signed)
Immediate Anesthesia Transfer of Care Note  Patient: Lori Dennis  Procedure(s) Performed: Procedure(s) with comments: CYSTOSCOPY WITH RETROGRADE PYELOGRAM,  AND  BILATERAL  STENT PLACEMENT (Bilateral) - CYSTOSCOPY WITH BILATERAL STENT PLACEMENT  Patient Location: PACU  Anesthesia Type:General  Level of Consciousness: awake, oriented, patient cooperative, lethargic and responds to stimulation  Airway & Oxygen Therapy: Patient Spontanous Breathing and Patient connected to face mask oxygen  Post-op Assessment: Report given to PACU RN, Post -op Vital signs reviewed and stable and Patient moving all extremities  Post vital signs: Reviewed and stable  Complications: No apparent anesthesia complications

## 2014-04-08 NOTE — Brief Op Note (Signed)
04/08/2014  10:26 PM  PATIENT:  Lori Dennis  68 y.o. female  PRE-OPERATIVE DIAGNOSIS:  bilateral nephrosis  POST-OPERATIVE DIAGNOSIS:  bilateral hydronephrosis with a left mid-ureteral stone and carcinoid tumor mass in the pelvis.   PROCEDURE:  Procedure(s) with comments: CYSTOSCOPY WITH RETROGRADE PYELOGRAM,  AND  BILATERAL  STENT PLACEMENT (Bilateral) - CYSTOSCOPY WITH BILATERAL STENT PLACEMENT  SURGEON:  Surgeon(s) and Role:    * Irine Seal, MD - Primary  PHYSICIAN ASSISTANT:   ASSISTANTS: none   ANESTHESIA:   general  EBL:  Total I/O In: 1000 [I.V.:1000] Out: -   BLOOD ADMINISTERED:none  DRAINS: left 6 x 24 JJ stent and right 4.8 x 24 JJ stent.    LOCAL MEDICATIONS USED:  NONE  SPECIMEN:  No Specimen  DISPOSITION OF SPECIMEN:  N/A  COUNTS:  YES  TOURNIQUET:  * No tourniquets in log *  DICTATION: .Other Dictation: Dictation Number [id number]  PLAN OF CARE: Admit to inpatient   PATIENT DISPOSITION:  PACU - hemodynamically stable.   Delay start of Pharmacological VTE agent (>24hrs) due to surgical blood loss or risk of bleeding: not applicable

## 2014-04-08 NOTE — Progress Notes (Signed)
Patient reports her pain is now 0/0.

## 2014-04-08 NOTE — H&P (Signed)
[facility] History and Physical  Lori Dennis YTK:[id number] DOB: 08-Apr-1946 DOA: 04/08/2014  Referring physician:  PCP: Irven Shelling, MD  Specialists:   Chief Complaint: nausea, vomiting, abd pain   HPI: Lori Dennis is a 68 y.o. female with PMH of HTN, CKD, metastatic carcinoid tumor s/p chemo, h/o SBO s/p small bowel, colon resection colostomy, gastrojejunostomy (2009) on IV TPN presented with worsening of intermittent nausea, abd pain associated with hematuria seen by oncology today and found to have new enlarged pelvic mass with obstructive nephropathy, AKI;  Patient had few episodes of nausea, intermittent diffuse lower abdominal pain, no melena, no hematochezia; denies diarrhea, no fever; denies chest pain, no SOB;   Review of Systems: The patient denies anorexia, fever, weight loss,, vision loss, decreased hearing, hoarseness, chest pain, syncope, dyspnea on exertion, peripheral edema, balance deficits, hemoptysis,  melena, hematochezia, severe indigestion/heartburn, hematuria, incontinence, genital sores, muscle weakness, suspicious skin lesions, transient blindness, difficulty walking, depression, unusual weight change, abnormal bleeding, enlarged lymph nodes, angioedema, and breast masses.    Past Medical History  Diagnosis Date  . Complication of anesthesia     slow to wake up  . Blood transfusion without reported diagnosis 2009 and 2011  . Hypertension     off all bp meds since Dec 20, 2012  . Chronic kidney disease 12-19-2012    elevated bun and creatinine   . Cancer 02/19/2008    Metastatic carcinoid tumor   Past Surgical History  Procedure Laterality Date  . Bowel resection  06/08/2010    ileocolonic resection, enteroenterostomy  . Left colectomy  04/02/2008    Exploratory laparotomy, biopsy of omental nodule, sigmoid colectomy  . Vaginal hysterectomy      at age 68  . Cholecystectomy      age 16  . Portacath placement Right 02/06/2013     Procedure: Ultrasound guided PORT-A-CATH INSERTION with flouro;  Surgeon: [name], MD;  Location: [facility];  Service: General;  Laterality: Right;  . Esophagogastroduodenoscopy N/A 06/28/2013    Procedure: ESOPHAGOGASTRODUODENOSCOPY (EGD);  Surgeon: Winfield Cunas., MD;  Location: [facility];  Service: Endoscopy;  Laterality: N/A;   Social History:  reports that she has never smoked. She has never used smokeless tobacco. She reports that she does not drink alcohol or use illicit drugs. [place];  where does patient live--home, ALF, SNF? and with whom if at home? Yes;  Can patient participate in ADLs?  No Known Allergies  Family History  Problem Relation Age of Onset  . Heart disease Mother   . Cancer Sister     ovarian    (be sure to complete)  Prior to Admission medications   Medication Sig Start Date End Date Taking? Authorizing Provider  antiseptic oral rinse (BIOTENE) LIQD 15 mLs by Mouth Rinse route as needed (for dry mouth).    Historical Provider, MD  menthol-cetylpyridinium (CEPACOL) 3 MG lozenge Take 1 lozenge by mouth as needed for pain.    Historical Provider, MD  ondansetron (ZOFRAN) 8 MG tablet Take 1 tablet (8 mg total) by mouth 2 (two) times daily. To control nausea 03/22/13   Ladell Pier, MD  oxyCODONE (ROXICODONE INTENSOL) 100 MG/5ML concentrated solution Take 0.3-0.5 mLs (6-10 mg total) by mouth every 4 (four) hours as needed for pain. 10/11/13   Ladell Pier, MD  promethazine (PHENERGAN) 12.5 MG suppository Place 1 suppository (12.5 mg total) rectally every 6 (six) hours as needed for nausea (if unable to tolerate po). 04/19/13  Ladell Pier, MD  promethazine (PHENERGAN) 12.5 MG tablet Take 1/2 - 1 tablet PO Q6h prn nausea 02/05/13   Ladell Pier, MD  TPN ADULT Inject into the vein continuous. 14 hours/day start between 2000-2100    Historical Provider, MD   Physical Exam: There were no vitals filed for this visit.   General:  alert  Eyes:  EOM-I  ENT: no oral ulcers   Neck: supple, no JVD  Cardiovascular: s1,s2 rrr  Respiratory: CTA BL  Abdomen: soft, diffuse mild tender, no rebound, colostomy, + J tube   Skin: no rash  Musculoskeletal: no LE edema  Psychiatric: no hallucinations   Neurologic: CN 2-12 intact, motor 5/5 BL  Labs on Admission:  Basic Metabolic Panel:  Recent Labs Lab 04/08/14 1456  NA 150*  K 4.9  CO2 23  GLUCOSE 150*  BUN 68.5*  CREATININE 5.4 Repeated and Verified*  CALCIUM 10.2   Liver Function Tests:  Recent Labs Lab 04/08/14 1456  AST 24  ALT 14  ALKPHOS 249*  BILITOT 0.62  PROT 7.4  ALBUMIN 2.6*   No results found for this basename: LIPASE, AMYLASE,  in the last 168 hours No results found for this basename: AMMONIA,  in the last 168 hours CBC: No results found for this basename: WBC, NEUTROABS, HGB, HCT, MCV, PLT,  in the last 168 hours Cardiac Enzymes: No results found for this basename: CKTOTAL, CKMB, CKMBINDEX, TROPONINI,  in the last 168 hours  BNP (last 3 results) No results found for this basename: PROBNP,  in the last 8760 hours CBG: No results found for this basename: GLUCAP,  in the last 168 hours  Radiological Exams on Admission: Ct Abdomen Pelvis Wo Contrast  04/08/2014   CLINICAL DATA:  History of metastatic carcinoid tumor with carcinomatosis. Progressive renal insufficiency with bilateral hydronephrosis on ultrasound today.  EXAM: CT ABDOMEN AND PELVIS WITHOUT CONTRAST  TECHNIQUE: Multidetector CT imaging of the abdomen and pelvis was performed following the standard protocol without intravenous contrast.  COMPARISON:  US RENAL dated 04/08/2014; SP PERCUT GASTROSTOMY TUBE INSERT W/FLUORO dated 07/24/2013; CT ABD/PELV WO CM dated 06/20/2013  FINDINGS: There is a new moderate size dependent left pleural effusion with associated left lower lobe atelectasis. No significant right pleural or pericardial effusion is seen. Nodular pleural thickening along the right  hemidiaphragm appears progressive.  As demonstrated on today CT, there is new bilateral hydronephrosis. Both ureters are dilated into the pelvis and appear obstructed by a large pelvic mass. Previously, this has been described as 2 separate masses, now more coalescent in the midline and difficult to differentiate. The right-sided component measures approximately 6.8 x 5.8 cm on image 54 and the left sided component approximately 5.1 x 3.9 cm. 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  There is evidence of progressive peritoneal carcinomatosis with irregular nodularity along the liver and increased nodularity throughout the mesenteric/omental fat. For example, there is a 3.4 cm left omental nodule on image 45 which previously measured 2.6 cm. There is a small amount of ascites which has increased in volume. There is also asymmetric perinephric soft tissue stranding on the left, likely related to the ureteral obstruction.  Percutaneous G tube is in place. No signs of bowel obstruction or extravasation of enteric contrast identified. The pelvic mass is difficult to separate from the rectosigmoid status post diverting colostomy.  There is diffuse hepatic steatosis which appears progressive. The underlying intrinsic  liver lesions are less well differentiated. The spleen, pancreas and adrenal glands demonstrate no significant findings.  There are no worrisome osseous findings.  IMPRESSION: 1. Progressive peritoneal carcinomatosis with enlargement of dominant pelvic mass causing bilateral distal ureteral obstruction. 2. No signs of bowel obstruction or extravasation identified. 3. New moderate size left pleural effusion with left lower lobe atelectasis.   Electronically Signed   By: Camie Patience M.D.   On: 04/08/2014 15:02   US Renal  04/08/2014   CLINICAL DATA:  History of metastatic carcinoid tumor. Acute renal failure. Rule out hydronephrosis.   EXAM: RENAL/URINARY TRACT ULTRASOUND COMPLETE  COMPARISON:  CT of 06/20/2013  FINDINGS: Right Kidney:  Length: 11.4 cm. Mild renal cortical thinning. Moderate to marked hydronephrosis.  Left Kidney:  Length: 11.6 cm. Mild renal cortical thinning. Moderate hydronephrosis.  Bladder:  Decompressed.  No bladder distention identified.  IMPRESSION: Bilateral, right greater than left hydronephrosis. Given the clinical scenario, suspicious for distal obstruction. Case was discussed with Dr. Benay Spice, the patient, and the patient's daughter all on the afternoon of 04/08/2014. A CT is pending.   Electronically Signed   By: Abigail Miyamoto M.D.   On: 04/08/2014 14:29    EKG: Independently reviewed. Not don e  Assessment/Plan Active Problems:   Carcinoid tumor of intestine   HTN (hypertension)   Nausea & vomiting   Acute renal failure   Dehydration   68 y.o. female with PMH of HTN, CKD, metastatic carcinoid tumor s/p chemo, h/o SBO s/p small bowel, colon resection colostomy, gastrojejunostomy (2009) on IV TPN presented with worsening of intermittent nausea, abd pain associated with hematuria seen by oncology today and found to have new enlarged pelvic mass with obstructive nephropathy, AKI;   1. AKI on CKD II with new obstructive nephropathy due to metastatic CA; -pend IR perc nephrostomy eval (Dr. Ammie Dalton called IR); I have also consulted nephrology, and urology Dr. Karsten Ro for possible stent eval -IVF, monitor I/O, labs; no s/s of uremia at this time; obtain labs, UA  2. Metastatic carcinoid tumor s/p chemo -CT (4/20): Progressive peritoneal carcinomatosis with enlargement of dominant pelvic mass causing bilateral distal ureteral obstruction -defer to oncology   3. Abdominal pain due to mass+hydronephrolsis  -CT: No signs of bowel obstruction or extravasation identified -cont as above, pain control; avoid sedation or res depression; await urology eval   4. h/o SBO s/p small bowel, colon resection  colostomy, gastrojejunostomy (2009) on IV TPN  -cont TPN; consult nutrition   5. HTN uncontrolled due to #1; patient is no ton home meds   -IV hydralazine prn;    Obtain labs/pend   Nephrology, urology, oncology;  if consultant consulted, please document name and whether formally or informally consulted  Code Status: full (must indicate code status--if unknown or must be presumed, indicate so) Family Communication:  D/w patient, daughter at t he bedside (indicate person spoken with, if applicable, with phone number if by telephone) Disposition Plan: pend clinical improvement  (indicate anticipated LOS)  Time spent: >35 minutes   Kinnie Feil [facility] Pager [phone]  If 7PM-7AM, please contact night-coverage [email or web address] Password [facility] 04/08/2014, 5:51 PM

## 2014-04-08 NOTE — Anesthesia Procedure Notes (Signed)
Procedure Name: Intubation Date/Time: 04/08/2014 9:27 PM Performed by: Ofilia Neas Pre-anesthesia Checklist: Timeout performed, Patient identified, Emergency Drugs available, Suction available and Patient being monitored Patient Re-evaluated:Patient Re-evaluated prior to inductionOxygen Delivery Method: Circle system utilized Preoxygenation: Pre-oxygenation with 100% oxygen Intubation Type: IV induction and Cricoid Pressure applied Laryngoscope Size: Mac and 3 Grade View: Grade II Tube type: Oral Tube size: 7.5 mm Number of attempts: 1 Airway Equipment and Method: Stylet Placement Confirmation: ETT inserted through vocal cords under direct vision,  positive ETCO2 and breath sounds checked- equal and bilateral Secured at: 21 cm Tube secured with: Tape Dental Injury: Teeth and Oropharynx as per pre-operative assessment

## 2014-04-08 NOTE — Anesthesia Preprocedure Evaluation (Signed)
Anesthesia Evaluation  Patient identified by MRN, date of birth, ID band Patient awake    Reviewed: Allergy & Precautions, H&P , NPO status , Patient's Chart, lab work & pertinent test results  History of Anesthesia Complications (+) PROLONGED EMERGENCE and history of anesthetic complications  Airway Mallampati: II TM Distance: >3 FB Neck ROM: Full    Dental no notable dental hx.    Pulmonary neg pulmonary ROS,  breath sounds clear to auscultation  Pulmonary exam normal       Cardiovascular hypertension, Rhythm:Regular Rate:Normal     Neuro/Psych negative neurological ROS  negative psych ROS   GI/Hepatic negative GI ROS, Neg liver ROS,   Endo/Other  negative endocrine ROS  Renal/GU Renal InsufficiencyRenal diseasenegative Renal ROS  negative genitourinary   Musculoskeletal negative musculoskeletal ROS (+)   Abdominal   Peds negative pediatric ROS (+)  Hematology negative hematology ROS (+)   Anesthesia Other Findings   Reproductive/Obstetrics negative OB ROS                           Anesthesia Physical Anesthesia Plan  ASA: III and emergent  Anesthesia Plan: General   Post-op Pain Management:    Induction: Intravenous  Airway Management Planned: LMA  Additional Equipment:   Intra-op Plan:   Post-operative Plan: Extubation in OR  Informed Consent: I have reviewed the patients History and Physical, chart, labs and discussed the procedure including the risks, benefits and alternatives for the proposed anesthesia with the patient or authorized representative who has indicated his/her understanding and acceptance.   Dental advisory given  Plan Discussed with: CRNA  Anesthesia Plan Comments:         Anesthesia Quick Evaluation

## 2014-04-08 NOTE — Progress Notes (Signed)
PARENTERAL NUTRITION CONSULT NOTE - INITIAL  Pharmacy Consult for TNA Indication: intolerance to enteral feedings  No Known Allergies  Patient Measurements: Height: 5\' 3"  (160 cm) Weight: 135 lb (61.236 kg) IBW/kg (Calculated) : 52.4  Vital Signs: Temp: 97.8 F (36.6 C) (04/20 1730) Temp src: Oral (04/20 1730) BP: 189/95 mmHg (04/20 1730) Pulse Rate: 85 (04/20 1730) Intake/Output from previous day:   Intake/Output from this shift:    Labs: No results found for this basename: WBC, HGB, HCT, PLT, APTT, INR,  in the last 72 hours   Recent Labs  04/08/14 1456  NA 150*  K 4.9  CO2 23  GLUCOSE 150*  BUN 68.5*  CREATININE 5.4 Repeated and Verified*  CALCIUM 10.2  PROT 7.4  ALBUMIN 2.6*  AST 24  ALT 14  ALKPHOS 249*  BILITOT 0.62   Estimated Creatinine Clearance: 8.4 ml/min (by C-G formula based on Cr of 5.4 Repeated and Verified).   No results found for this basename: GLUCAP,  in the last 72 hours  Medical History: Past Medical History  Diagnosis Date  . Complication of anesthesia     slow to wake up  . Blood transfusion without reported diagnosis 2009 and 2011  . Hypertension     off all bp meds since Dec 20, 2012  . Chronic kidney disease 12-19-2012    elevated bun and creatinine   . Cancer 02/19/2008    Metastatic carcinoid tumor    Medications:  Scheduled:  . ciprofloxacin  200 mg Intravenous 60 min Pre-Op   Infusions:  . dextrose 5 % and 0.45% NaCl      Insulin Requirements in the past 24 hours:  No insulin currently ordered  Current Nutrition:  NPO  IVF: D5W  Assessment: 33 yoF admitted 4/20  with worsening of intermittent nausea, abd pain associated with hematuria seen by oncology today and found to have new enlarged pelvic mass with obstructive nephropathy, AKI. Pt with PMH of HTN, CKD, metastatic carcinoid tumor s/p chemo, h/o SBO s/p small bowel, colon resection colostomy, gastrojejunostomy (2009) on IV TPN. Pharmacy has been consulted  to continue TNA while inpatient. Patient did not come in with home TNA bag.  4/20: patient admitted after TNA could be hung, started IVF per discussion with [facility] MD, TNA will be started on 4/21  Home TNA provided by [facility]: 2 days/week: 1550 kcal/d, 50 gm protein/d, 50 gm 30% fat emulsion 5 days/week: 1049 kcal/d, 50 gm protein/d, no fats TNA delivered as a cyclic administration: 2.6R/S delivered over 16 hours including 1 hour ramp up and 1 hour ramp down  Labs: Electrolytes: ordered Renal Function: ordered Hepatic Function: ordered Pre-Albumin: ordered Triglycerides: ordered CBGs: ordered   TNA day#: continued from home TNA access: PAC  Nutritional Goals:  RD recommendations pending  Plan:  - enter TNA lab panel - start D5W w 1/2 NS at 75 ml/hr per discussion with [facility] MD - initiate sensitive scale SSI with CBG checks q6h starting 4/21 at 1200 - follow-up RD recommendations - follow-up tomorrow for TNA ordering - pharmacy will follow-up daily  Thank you for the consult.  Johny Drilling, PharmD, BCPS Pager: [phone] Pharmacy: [phone] 04/08/2014 8:25 PM

## 2014-04-08 NOTE — Telephone Encounter (Signed)
Per Dr. Benay Spice : Do bilateral renal ultrasound to r/o hydronephrosis. [place] agrees to [employer] arrive at [facility] at 12:45 today.

## 2014-04-08 NOTE — Progress Notes (Signed)
Per Dr. Benay Spice: Needs stat Cmet today after her scan. Notified radiology to send her back over to cancer center after scan.

## 2014-04-08 NOTE — Progress Notes (Signed)
[facility] OFFICE PROGRESS NOTE   Diagnosis: Carcinoid tumor  INTERVAL HISTORY:   Lori Dennis presents prior to a scheduled visit. She complains of increased low abdominal pain for the past several days. She also noted increased nausea over the weekend. She is passing "liquid "in the colostomy bag. She continues to open the gastrostomy tube when she has nausea.  She is scheduled to see Dr. Karsten Ro for evaluation of the gross hematuria. The creatinine returned elevated at 2.73 with a BUN of 40 on 04/05/2014. This is despite increasing home IV fluids. She continues home TNA.  A renal ultrasound today confirmed bilateral hydronephrosis. A CT of the abdomen confirmed new bilateral hydronephrosis with dilation of the ureters into the pelvis. A large pelvic mass and progressive peritoneal carcinomatosis is noted. No evidence of a bowel obstruction. New left pleural effusion.  Objective:  Vital signs in last 24 hours:  There were no vitals taken for this visit.    HEENT: The mouth is dry, whitecoat over the tongue Resp: Lungs clear bilaterally, no respiratory distress Cardio: Regular rate and rhythm GI: No hepatomegaly, gastrostomy tube site without evidence of infection, left lower quadrant colostomy bag is empty, firm fullness in the right lower abdomen, tender in the bilateral lower abdomen Vascular: Trace edema at the left lower leg  Portacath/PICC-without erythema  Lab Results:  Potassium 4.9, BUN 68.5, creatinine 5.4, albumin 2.6, calcium 10.2 Imaging:  Ct Abdomen Pelvis Wo Contrast  04/08/2014   CLINICAL DATA:  History of metastatic carcinoid tumor with carcinomatosis. Progressive renal insufficiency with bilateral hydronephrosis on ultrasound today.  EXAM: CT ABDOMEN AND PELVIS WITHOUT CONTRAST  TECHNIQUE: Multidetector CT imaging of the abdomen and pelvis was performed following the standard protocol without intravenous contrast.  COMPARISON:  US RENAL dated  04/08/2014; SP PERCUT GASTROSTOMY TUBE INSERT W/FLUORO dated 07/24/2013; CT ABD/PELV WO CM dated 06/20/2013  FINDINGS: There is a new moderate size dependent left pleural effusion with associated left lower lobe atelectasis. No significant right pleural or pericardial effusion is seen. Nodular pleural thickening along the right hemidiaphragm appears progressive.  As demonstrated on today CT, there is new bilateral hydronephrosis. Both ureters are dilated into the pelvis and appear obstructed by a large pelvic mass. Previously, this has been described as 2 separate masses, now more coalescent in the midline and difficult to differentiate. The right-sided component measures approximately 6.8 x 5.8 cm on image 54 and the left sided component approximately 5.1 x 3.9 cm. 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  There is evidence of progressive peritoneal carcinomatosis with irregular nodularity along the liver and increased nodularity throughout the mesenteric/omental fat. For example, there is a 3.4 cm left omental nodule on image 45 which previously measured 2.6 cm. There is a small amount of ascites which has increased in volume. There is also asymmetric perinephric soft tissue stranding on the left, likely related to the ureteral obstruction.  Percutaneous G tube is in place. No signs of bowel obstruction or extravasation of enteric contrast identified. The pelvic mass is difficult to separate from the rectosigmoid status post diverting colostomy.  There is diffuse hepatic steatosis which appears progressive. The underlying intrinsic liver lesions are less well differentiated. The spleen, pancreas and adrenal glands demonstrate no significant findings.  There are no worrisome osseous findings.  IMPRESSION: 1. Progressive peritoneal carcinomatosis with enlargement of dominant pelvic mass causing bilateral distal ureteral obstruction. 2. No signs  of bowel obstruction or extravasation identified. 3. New moderate size left pleural effusion with left lower lobe atelectasis.   Electronically Signed   By: Camie Patience M.D.   On: 04/08/2014 15:02   US Renal  04/08/2014   CLINICAL DATA:  History of metastatic carcinoid tumor. Acute renal failure. Rule out hydronephrosis.  EXAM: RENAL/URINARY TRACT ULTRASOUND COMPLETE  COMPARISON:  CT of 06/20/2013  FINDINGS: Right Kidney:  Length: 11.4 cm. Mild renal cortical thinning. Moderate to marked hydronephrosis.  Left Kidney:  Length: 11.6 cm. Mild renal cortical thinning. Moderate hydronephrosis.  Bladder:  Decompressed.  No bladder distention identified.  IMPRESSION: Bilateral, right greater than left hydronephrosis. Given the clinical scenario, suspicious for distal obstruction. Case was discussed with Dr. Benay Spice, the patient, and the patient's daughter all on the afternoon of 04/08/2014. A CT is pending.   Electronically Signed   By: Abigail Miyamoto M.D.   On: 04/08/2014 14:29    Medications: I have reviewed the patient's current medications.  Assessment/Plan:  1. Metastatic carcinoid tumor with carcinomatosis prompting multiple bowel resection procedures, status post a palliative sigmoid colectomy/colostomy, right colectomy, and gastrojejunostomy in April 2009, status post placement of a palliative gastrostomy tube 07/24/2013  2. Acute renal failure secondary to an obstructing pelvic mass  3. Gross hematuria-likely secondary to tumor involving the bladder or upper urinary tract  4. Chronic left leg edema-negative Doppler 11/03/2012  5. Nutrition-maintained on home TNA  6. Right scalp herpes zoster rash July 2013  7. Pain secondary to progressive carcinomatosis  Disposition:  Lori Dennis has developed acute renal failure secondary to an obstructive uropathy. There appears to be a pelvic mass causing bilateral hydronephrosis. I discussed the ultrasound and CT findings with Ms. Wuellner and  her daughter. She would like to proceed with treatment of the hydronephrosis/renal failure.  I contacted Dr. Daleen Bo and he has graciously agreed to admit Ms. Bartolome for management of the acute renal failure. I contacted interventional radiology and they will consider placement of percutaneous nephrostomy tubes.  We will consult urology to consider the indication for a cystoscopy to evaluate the hematuria and ureter stent placement.  Ladell Pier, MD  04/08/2014  5:09 PM

## 2014-04-08 NOTE — Progress Notes (Signed)
Report called to Erasmo Downer, Therapist, sports. Pt to be transferred to 1427 for telemetry monitoring.

## 2014-04-08 NOTE — Telephone Encounter (Signed)
Call to make MD aware of 4/17 BUN=40/ Creatinine =2.73 (double prior results). Still getting 2 liter fluid in her TPN and gives herself extra liter of fluid 3-4 days/week, which she says is all she can do because "she has to help her daughter". Is nauseated and gagging. Does unclamp G-tube prn. Taking her IV Zofran and Phenergan.

## 2014-04-08 NOTE — Consult Note (Signed)
Subjective: I was asked to see [name] by Dr. Daleen Bo for bilateral hydronephrosis with ARI.   She has a history of Carcinoid dating back to 2011.  She has a recurrent mass and was admitted today with the acute onset of nausea and vomiting that followed a few days of lower abdominal pain with radiation to the left groin/hip area.  She had a renal US today that showed bilateral hydro that was confirmed with CT today as well.   She has a large pelvic mass with bilateral ureteral obstruction.  On a CT in 7/14 she had a stone in the left renal pelvis that is no longer there and maybe in the left distal ureter but it is difficult to say.  She has urinary extravasation around the left kidney and a left pleural effusion.   She was seen about a week ago with hematuria and was treated with antibiotics.  Her Cr then was 2.63 but is now up to 5.4.   She has no other voiding complaints.   ROS:  Review of Systems  Constitutional: Positive for weight loss. Negative for fever and chills.  HENT: Negative.   Eyes: Negative.   Respiratory: Negative.   Cardiovascular: Positive for leg swelling (mild in the left ankle and she had negative dopplers about 3 weeks ago. ).  Gastrointestinal: Positive for nausea, vomiting and abdominal pain.  Genitourinary: Positive for hematuria and flank pain. Negative for dysuria and urgency.  Musculoskeletal: Negative.   Skin: Negative.   Neurological: Negative.   Endo/Heme/Allergies: Negative.   Psychiatric/Behavioral: Negative.     No Known Allergies  Past Medical History  Diagnosis Date  . Complication of anesthesia     slow to wake up  . Blood transfusion without reported diagnosis 2009 and 2011  . Hypertension     off all bp meds since Dec 20, 2012  . Chronic kidney disease 12-19-2012    elevated bun and creatinine   . Cancer 02/19/2008    Metastatic carcinoid tumor    Past Surgical History  Procedure Laterality Date  . Bowel resection  06/08/2010     ileocolonic resection, enteroenterostomy  . Left colectomy  04/02/2008    Exploratory laparotomy, biopsy of omental nodule, sigmoid colectomy  . Vaginal hysterectomy      at age 68  . Cholecystectomy      age 29  . Portacath placement Right 02/06/2013    Procedure: Ultrasound guided PORT-A-CATH INSERTION with flouro;  Surgeon: [name], MD;  Location: [facility];  Service: General;  Laterality: Right;  . Esophagogastroduodenoscopy N/A 06/28/2013    Procedure: ESOPHAGOGASTRODUODENOSCOPY (EGD);  Surgeon: Winfield Cunas., MD;  Location: [facility];  Service: Endoscopy;  Laterality: N/A;    History   Social History  . Marital Status: Divorced    Spouse Name: N/A    Number of Children: N/A  . Years of Education: N/A   Occupational History  . Not on file.   Social History Main Topics  . Smoking status: Never Smoker   . Smokeless tobacco: Never Used  . Alcohol Use: No  . Drug Use: No  . Sexual Activity: Not on file   Other Topics Concern  . Not on file   Social History Narrative  . No narrative on file    Family History  Problem Relation Age of Onset  . Heart disease Mother   . Cancer Sister     ovarian    Anti-infectives: Anti-infectives  None      Current Facility-Administered Medications  Medication Dose Route Frequency Provider Last Rate Last Dose  . dextrose 5 %-0.45 % sodium chloride infusion   Intravenous Continuous Kinnie Feil, MD       Facility-Administered Medications Ordered in Other Encounters  Medication Dose Route Frequency Provider Last Rate Last Dose  . sodium chloride 0.9 % injection 10 mL  10 mL Intravenous PRN Ladell Pier, MD   10 mL at 04/08/14 1645     Objective: Vital signs in last 24 hours: Temp:  [97.8 F (36.6 C)-98 F (36.7 C)] 97.8 F (36.6 C) (04/20 1730) Pulse Rate:  [84-85] 85 (04/20 1730) Resp:  [16] 16 (04/20 1730) BP: (189-191)/(85-95) 189/95 mmHg (04/20 1730) Weight:  [61.236 kg (135 lb)] 61.236 kg (135  lb) (04/20 1730)  Intake/Output from previous day:   Intake/Output this shift:     Physical Exam  Constitutional: She is oriented to person, place, and time.  WD, thin WF in NAD  HENT:  Head: Normocephalic and atraumatic.  Neck: Normal range of motion. Neck supple.  Cardiovascular: Normal rate and regular rhythm.   No murmur heard. Pulmonary/Chest: Effort normal and breath sounds normal. No respiratory distress.  Abdominal: Soft. She exhibits no distension. There is tenderness (suprapubic and bilateral flank tenderness. ).  Musculoskeletal: Normal range of motion. She exhibits no edema and no tenderness.  Neurological: She is alert and oriented to person, place, and time. She exhibits normal muscle tone.  Skin: Skin is warm and dry.  Psychiatric: Mood and affect normal.    Lab Results:  No results found for this basename: WBC, HGB, HCT, PLT,  in the last 72 hours BMET  Recent Labs  04/08/14 1456  NA 150*  K 4.9  CO2 23  GLUCOSE 150*  BUN 68.5*  CREATININE 5.4 Repeated and Verified*  CALCIUM 10.2   PT/INR No results found for this basename: LABPROT, INR,  in the last 72 hours ABG No results found for this basename: PHART, PCO2, PO2, HCO3,  in the last 72 hours  Studies/Results: Ct Abdomen Pelvis Wo Contrast  04/08/2014   CLINICAL DATA:  History of metastatic carcinoid tumor with carcinomatosis. Progressive renal insufficiency with bilateral hydronephrosis on ultrasound today.  EXAM: CT ABDOMEN AND PELVIS WITHOUT CONTRAST  TECHNIQUE: Multidetector CT imaging of the abdomen and pelvis was performed following the standard protocol without intravenous contrast.  COMPARISON:  US RENAL dated 04/08/2014; SP PERCUT GASTROSTOMY TUBE INSERT W/FLUORO dated 07/24/2013; CT ABD/PELV WO CM dated 06/20/2013  FINDINGS: There is a new moderate size dependent left pleural effusion with associated left lower lobe atelectasis. No significant right pleural or pericardial effusion is seen. Nodular  pleural thickening along the right hemidiaphragm appears progressive.  As demonstrated on today CT, there is new bilateral hydronephrosis. Both ureters are dilated into the pelvis and appear obstructed by a large pelvic mass. Previously, this has been described as 2 separate masses, now more coalescent in the midline and difficult to differentiate. The right-sided component measures approximately 6.8 x 5.8 cm on image 54 and the left sided component approximately 5.1 x 3.9 cm. 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  There is evidence of progressive peritoneal carcinomatosis with irregular nodularity along the liver and increased nodularity throughout the mesenteric/omental fat. For example, there is a 3.4 cm left omental nodule on image 45 which previously measured 2.6 cm. There is a small amount  of ascites which has increased in volume. There is also asymmetric perinephric soft tissue stranding on the left, likely related to the ureteral obstruction.  Percutaneous G tube is in place. No signs of bowel obstruction or extravasation of enteric contrast identified. The pelvic mass is difficult to separate from the rectosigmoid status post diverting colostomy.  There is diffuse hepatic steatosis which appears progressive. The underlying intrinsic liver lesions are less well differentiated. The spleen, pancreas and adrenal glands demonstrate no significant findings.  There are no worrisome osseous findings.  IMPRESSION: 1. Progressive peritoneal carcinomatosis with enlargement of dominant pelvic mass causing bilateral distal ureteral obstruction. 2. No signs of bowel obstruction or extravasation identified. 3. New moderate size left pleural effusion with left lower lobe atelectasis.   Electronically Signed   By: Camie Patience M.D.   On: 04/08/2014 15:02   US Renal  04/08/2014   CLINICAL DATA:  History of metastatic carcinoid tumor. Acute  renal failure. Rule out hydronephrosis.  EXAM: RENAL/URINARY TRACT ULTRASOUND COMPLETE  COMPARISON:  CT of 06/20/2013  FINDINGS: Right Kidney:  Length: 11.4 cm. Mild renal cortical thinning. Moderate to marked hydronephrosis.  Left Kidney:  Length: 11.6 cm. Mild renal cortical thinning. Moderate hydronephrosis.  Bladder:  Decompressed.  No bladder distention identified.  IMPRESSION: Bilateral, right greater than left hydronephrosis. Given the clinical scenario, suspicious for distal obstruction. Case was discussed with Dr. Benay Spice, the patient, and the patient's daughter all on the afternoon of 04/08/2014. A CT is pending.   Electronically Signed   By: Abigail Miyamoto M.D.   On: 04/08/2014 14:29   I have reviewed the labs and films as well as Dr. Gearldine Shown office notes.   Assessment: Bilateral ureteral obstruction secondary to a carcinoid with contribution from a possible left distal stone which could explain the left laterality and the more acute progression.   She has acute on chronic renal insufficiency as well.    Plan: At this point I think the most expedient and appropriate option is to proceed with cystoscopy and bilateral stent insertion and not wait until tomorrow to do percs.     I reviewed the risks of bleeding, infection, failure to access the kidneys requiring percutaneous drainage, injury to the ureters or bladder, thrombotic events and anesthetic complications.    CC: Dr. Rowe Clack and Dr. Julieanne Manson    LOS: 0 days    Irine Seal 04/08/2014

## 2014-04-09 ENCOUNTER — Encounter (HOSPITAL_COMMUNITY): Payer: Self-pay | Admitting: *Deleted

## 2014-04-09 DIAGNOSIS — D638 Anemia in other chronic diseases classified elsewhere: Secondary | ICD-10-CM | POA: Diagnosis present

## 2014-04-09 DIAGNOSIS — C7A00 Malignant carcinoid tumor of unspecified site: Secondary | ICD-10-CM

## 2014-04-09 DIAGNOSIS — R310 Gross hematuria: Secondary | ICD-10-CM

## 2014-04-09 DIAGNOSIS — G893 Neoplasm related pain (acute) (chronic): Secondary | ICD-10-CM

## 2014-04-09 DIAGNOSIS — E870 Hyperosmolality and hypernatremia: Secondary | ICD-10-CM | POA: Diagnosis present

## 2014-04-09 DIAGNOSIS — N201 Calculus of ureter: Secondary | ICD-10-CM

## 2014-04-09 DIAGNOSIS — C7B8 Other secondary neuroendocrine tumors: Secondary | ICD-10-CM

## 2014-04-09 LAB — DIFFERENTIAL
Basophils Absolute: 0 10*3/uL (ref 0.0–0.1)
Basophils Relative: 0 % (ref 0–1)
EOS ABS: 0 10*3/uL (ref 0.0–0.7)
Eosinophils Relative: 0 % (ref 0–5)
LYMPHS ABS: 0.8 10*3/uL (ref 0.7–4.0)
LYMPHS PCT: 11 % — AB (ref 12–46)
Monocytes Absolute: 0 10*3/uL — ABNORMAL LOW (ref 0.1–1.0)
Monocytes Relative: 1 % — ABNORMAL LOW (ref 3–12)
NEUTROS ABS: 6.2 10*3/uL (ref 1.7–7.7)
NEUTROS PCT: 88 % — AB (ref 43–77)

## 2014-04-09 LAB — COMPREHENSIVE METABOLIC PANEL
ALBUMIN: 2.2 g/dL — AB (ref 3.5–5.2)
ALT: 15 U/L (ref 0–35)
AST: 22 U/L (ref 0–37)
Alkaline Phosphatase: 214 U/L — ABNORMAL HIGH (ref 39–117)
BUN: 64 mg/dL — ABNORMAL HIGH (ref 6–23)
CALCIUM: 9.1 mg/dL (ref 8.4–10.5)
CO2: 23 mEq/L (ref 19–32)
CREATININE: 5.04 mg/dL — AB (ref 0.50–1.10)
Chloride: 111 mEq/L (ref 96–112)
GFR calc Af Amer: 9 mL/min — ABNORMAL LOW (ref >90)
GFR calc non Af Amer: 8 mL/min — ABNORMAL LOW (ref >90)
Glucose, Bld: 148 mg/dL — ABNORMAL HIGH (ref 70–99)
Potassium: 4.8 mEq/L (ref 3.7–5.3)
SODIUM: 148 meq/L — AB (ref 137–147)
TOTAL PROTEIN: 6.6 g/dL (ref 6.0–8.3)
Total Bilirubin: 0.4 mg/dL (ref 0.3–1.2)

## 2014-04-09 LAB — URINALYSIS, ROUTINE W REFLEX MICROSCOPIC
Bilirubin Urine: NEGATIVE
Glucose, UA: NEGATIVE mg/dL
Ketones, ur: NEGATIVE mg/dL
Nitrite: NEGATIVE
PH: 6 (ref 5.0–8.0)
Protein, ur: 100 mg/dL — AB
SPECIFIC GRAVITY, URINE: 1.009 (ref 1.005–1.030)
UROBILINOGEN UA: 0.2 mg/dL (ref 0.0–1.0)

## 2014-04-09 LAB — GLUCOSE, CAPILLARY
GLUCOSE-CAPILLARY: 109 mg/dL — AB (ref 70–99)
Glucose-Capillary: 120 mg/dL — ABNORMAL HIGH (ref 70–99)
Glucose-Capillary: 90 mg/dL (ref 70–99)
Glucose-Capillary: 95 mg/dL (ref 70–99)

## 2014-04-09 LAB — URINE MICROSCOPIC-ADD ON

## 2014-04-09 LAB — MAGNESIUM: Magnesium: 2 mg/dL (ref 1.5–2.5)

## 2014-04-09 LAB — PHOSPHORUS: PHOSPHORUS: 4.9 mg/dL — AB (ref 2.3–4.6)

## 2014-04-09 LAB — TRIGLYCERIDES: Triglycerides: 58 mg/dL (ref <150)

## 2014-04-09 LAB — CBC
HCT: 27.4 % — ABNORMAL LOW (ref 36.0–46.0)
Hemoglobin: 8.7 g/dL — ABNORMAL LOW (ref 12.0–15.0)
MCH: 28.7 pg (ref 26.0–34.0)
MCHC: 31.8 g/dL (ref 30.0–36.0)
MCV: 90.4 fL (ref 78.0–100.0)
PLATELETS: 217 10*3/uL (ref 150–400)
RBC: 3.03 MIL/uL — AB (ref 3.87–5.11)
RDW: 15.9 % — ABNORMAL HIGH (ref 11.5–15.5)
WBC: 7 10*3/uL (ref 4.0–10.5)

## 2014-04-09 LAB — PREALBUMIN: Prealbumin: 20.6 mg/dL (ref 17.0–34.0)

## 2014-04-09 MED ORDER — FAT EMULSION 20 % IV EMUL
250.0000 mL | INTRAVENOUS | Status: DC
  Administered 2014-04-09: 250 mL via INTRAVENOUS
  Filled 2014-04-09: qty 250

## 2014-04-09 MED ORDER — INSULIN ASPART 100 UNIT/ML ~~LOC~~ SOLN: 0.0000 [IU] | Freq: Four times a day (QID) | SUBCUTANEOUS | Status: DC

## 2014-04-09 MED ORDER — M.V.I. ADULT IV INJ
INTRAVENOUS | Status: DC
  Administered 2014-04-09: 18:00:00 via INTRAVENOUS
  Filled 2014-04-09: qty 1000

## 2014-04-09 NOTE — Progress Notes (Signed)
[facility]  Patient Status:   Active pt with [facility] up to the time of this readmission  [facility] is providing the following services: HHRN and [facility] for home TNA.  [facility] hospital team will follow Ms. Smay to support transition home when deemed appropriate by MD/hospital team.         If patient discharges after hours, please call [phone].   Larry Sierras 04/09/2014, 10:26 AM

## 2014-04-09 NOTE — Op Note (Signed)
NAME:  ELIANNA, WINDOM NO.:  [id number]  MEDICAL RECORD NO.:  [id number]  LOCATION:                                 FACILITY:  PHYSICIAN:  Marshall Cork. Jeffie Pollock, M.D.    DATE OF BIRTH:  Apr 06, 1946  DATE OF PROCEDURE:  04/08/2014 DATE OF DISCHARGE:                              OPERATIVE REPORT   The patient of Dr. Daleen Bo.  PROCEDURE:  Cystoscopy with bilateral retrograde pyelograms with interpretation and bilateral ureteral stent insertion.  PREOPERATIVE DIAGNOSIS:  Bilateral hydronephrosis with acute renal insufficiency from a carcinoid mass in the pelvis.  POSTOPERATIVE DIAGNOSIS:  Bilateral hydronephrosis with acute renal insufficiency from a carcinoid mass in the pelvis but with a left mid ureteral stone contributing to obstruction.  SURGEON:  Marshall Cork. Jeffie Pollock, M.D.  ANESTHESIA:  General.  SPECIMEN:  None.  DRAINS:  A 6-French x 24 cm left double-J stent, and a 4.8-French x 24 cm right double-J stent.  BLOOD LOSS:  Minimal.  COMPLICATIONS:  None.  INDICATIONS:  Ms. 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  She has had some progressive pain over the last several days, as well as hematuria.  Her creatinine went from 2.3-5.4.  Today, the renal ultrasound showed bilateral hydro and a CT scan confirmed bilateral hydro with what appeared to be urinary extravasation on the left.  She previously had renal pelvic stone on the left in July of last year.  There was no longer apparent in the kidney on this scan but there was a calcification in the left pelvis that was concerning for that stone.  It was felt that cysto and stent placement was indicated.  FINDINGS AND PROCEDURE:  She was given Cipro 200 mg IV.  She was taken to the operating room where general anesthetic was induced.  She was fitted with PAS hose.  She was placed in lithotomy position.  Perineum and genitalia were prepped with Betadine solution.  She was  draped in usual sterile fashion.  Cystoscopy was performed using a 22-French scope and 12-degree lens. Examination revealed a normal urethra.  The trigone was elevated in the fashion that suggested underlying tumor.  The bladder wall had moderate severe trabeculation.  No tumors or stones were identified.  The right ureteral orifice was somewhat deviated outward but could be cannulated with a 5-French opening catheter.  Contrast was instilled and retrograde pyelogram demonstrated narrowing of the distal ureter, with the most tight area just below the edge of the sacrum with some proximal tortuosity and hydronephrosis.  A guidewire was advanced to the kidney through the ureter with mild resistance but once I reached the kink in the proximal ureter, I could not comfortably say that I had advanced it into the kidney.  An open-end catheter was then advanced over the wire at this point and the wire was backed out.  There was a brisk hydronephrotic drip.  I then used the Glidewire to further advance to ensure the wire was in the kidney and advanced the open-end catheter on into the kidney.  The Glidewire was removed and the Sensor wire was replaced.  The open-end catheter was then removed.  At this point, attempts were made to pass first a 6-French and then a 4.8-French double-J stent and both were unsuccessful.  I could not get them through the ureteral meatus.  The guidewire was left in place and then I turned my attention to the left side.  A 5-French open-end catheter was used to cannulate the left ureteral orifice which was less distorted than the right.  Contrast was instilled which revealed fairly normal caliber ureter until the mid ureter where there was a small area of tightness with a filling defect proximal consistent with a stone.  There was hydronephrosis proximal to this.  A guidewire was then advanced through the 5-French catheter and advanced to the kidney.  Initially,  the tip of the wire appeared to pass through the collecting system, so it was backed out and repositioned.  At this point, a 6-French x 24 cm double-J stent was passed over the wire.  There was some resistance but I was able to advance it to the kidney.  By the time, the stent was in position.  There was some extravasation from the renal collecting system on the left outlining the ureter but the stent appeared to be in good position, and there was efflux of urine through the stent holes once the wire had been removed. When the wire was removed, there was good coil in the kidney, a good coil in the bladder.  At this point, I took the 12-French inner core of the 38 cm digital ureteroscopic access sheath and advanced it over the right wire and dilated the distal ureter for approximately 5 cm.  I could pass it no further.  The cystoscope was reinserted over the right wire and a 4.8 x 24 double- J stent was successfully advanced to the kidney.  The wire was removed leaving good coil in the kidney and a good coil in the bladder.  Of note, the fluoroscopy malfunction just as I was pushing the last 4-5 cm stent into the collecting system and had to be rebooted but this confirmed good positioning.  Once both stents were in position, the bladder was drained.  The cystoscope was removed.  The patient was taken down from lithotomy position.  Her anesthetic was reversed.  She was moved to recovery room in stable condition.  There were no complications.     Marshall Cork. Jeffie Pollock, M.D.     JJW/MEDQ  D:  04/08/2014  T:  04/09/2014  Job:  [id number]  cc:   Laurice Record, MD

## 2014-04-09 NOTE — Progress Notes (Signed)
Patient ID: Lori Dennis, female   DOB: Dec 24, 1945, 68 y.o.   MRN: [id number] 1 Day Post-Op  Subjective: Comfort is feeling better with less pain since the stents were placed.  She has increased UOP but the urine is bloody.  She is not having much stent irritation.   Her Cr has already declined from the high of 5.8 to 5.04.    ROS:  Review of Systems  Constitutional: Negative for fever.  Genitourinary: Negative for flank pain.    Anti-infectives: Anti-infectives   Start     Dose/Rate Route Frequency Ordered Stop   04/09/14 0900  ciprofloxacin (CIPRO) IVPB 200 mg     200 mg 100 mL/hr over 60 Minutes Intravenous Every 12 hours 04/08/14 2324 04/09/14 2059   04/08/14 2005  ciprofloxacin (CIPRO) IVPB 200 mg     200 mg 100 mL/hr over 60 Minutes Intravenous 60 min pre-op 04/08/14 2005 04/08/14 2115      Current Facility-Administered Medications  Medication Dose Route Frequency Provider Last Rate Last Dose  . acetaminophen (TYLENOL) tablet 650 mg  650 mg Oral Q6H PRN Kinnie Feil, MD       Or  . acetaminophen (TYLENOL) suppository 650 mg  650 mg Rectal Q6H PRN Kinnie Feil, MD      . antiseptic oral rinse (BIOTENE) solution 15 mL  15 mL Mouth Rinse PRN Kinnie Feil, MD      . ciprofloxacin (CIPRO) IVPB 200 mg  200 mg Intravenous Q12H Irine Seal, MD      . dextrose 5 %-0.45 % sodium chloride infusion   Intravenous Continuous Kinnie Feil, MD 75 mL/hr at 04/09/14 0027    . heparin injection 5,000 Units  5,000 Units Subcutaneous 3 times per day Kinnie Feil, MD      . hydrALAZINE (APRESOLINE) injection 10 mg  10 mg Intravenous Q4H PRN Kinnie Feil, MD      . HYDROmorphone (DILAUDID) injection 0.5 mg  0.5 mg Intravenous Q4H PRN Kinnie Feil, MD      . insulin aspart (novoLOG) injection 0-9 Units  0-9 Units Subcutaneous 4 times per day Mosetta Pigeon, RPH      . ondansetron (ZOFRAN) tablet 4 mg  4 mg Oral Q6H PRN Kinnie Feil, MD       Or  . ondansetron (ZOFRAN)  injection 4 mg  4 mg Intravenous Q6H PRN Kinnie Feil, MD      . oxyCODONE (ROXICODONE INTENSOL) 20 MG/ML concentrated solution 6-10 mg  6-10 mg Oral Q4H PRN Kinnie Feil, MD   10 mg at 04/09/14 0044  . phenazopyridine (PYRIDIUM) tablet 100 mg  100 mg Oral TID WC PRN Irine Seal, MD      . promethazine (PHENERGAN) injection 12.5 mg  12.5 mg Intravenous Q6H PRN Irine Seal, MD      . sodium chloride 0.9 % injection 10-40 mL  10-40 mL Intracatheter PRN Kinnie Feil, MD   10 mL at 04/09/14 0356  . sodium chloride 0.9 % injection 3 mL  3 mL Intravenous Q12H Kinnie Feil, MD       Facility-Administered Medications Ordered in Other Encounters  Medication Dose Route Frequency Provider Last Rate Last Dose  . sodium chloride 0.9 % injection 10 mL  10 mL Intravenous PRN Ladell Pier, MD   10 mL at 04/08/14 1645     Objective: Vital signs in last 24 hours: Temp:  [97.8 F (36.6 C)-98.8 F (37.1 C)]  97.8 F (36.6 C) (04/20 2315) Pulse Rate:  [69-85] 73 (04/20 2315) Resp:  [12-18] 14 (04/20 2315) BP: (165-191)/(73-95) 165/84 mmHg (04/20 2315) SpO2:  [99 %-100 %] 99 % (04/20 2315) Weight:  [61.236 kg (135 lb)] 61.236 kg (135 lb) (04/20 1730)  Intake/Output from previous day: 04/20 0701 - 04/21 0700 In: 1100 [I.V.:1100] Out: 700 [Urine:700] Intake/Output this shift: Total I/O In: 1100 [I.V.:1100] Out: 700 [Urine:700]   Physical Exam  Constitutional: She is oriented to person, place, and time. She appears malnourished. No distress.  Abdominal:  She has no residual upper abdominal or flank tenderness.   Neurological: She is alert and oriented to person, place, and time.    Lab Results:   Recent Labs  04/08/14 2045 04/09/14 0400  WBC 8.7 7.0  HGB 8.9* 8.7*  HCT 27.3* 27.4*  PLT 239 217   BMET  Recent Labs  04/08/14 1456 04/08/14 2045 04/09/14 0400  NA 150*  --  148*  K 4.9  --  4.8  CL  --   --  111  CO2 23  --  23  GLUCOSE 150*  --  148*  BUN 68.5*  --   64*  CREATININE 5.4 Repeated and Verified* 5.82* 5.04*  CALCIUM 10.2  --  9.1   PT/INR No results found for this basename: LABPROT, INR,  in the last 72 hours ABG No results found for this basename: PHART, PCO2, PO2, HCO3,  in the last 72 hours  Studies/Results: Ct Abdomen Pelvis Wo Contrast  04/08/2014   CLINICAL DATA:  History of metastatic carcinoid tumor with carcinomatosis. Progressive renal insufficiency with bilateral hydronephrosis on ultrasound today.  EXAM: CT ABDOMEN AND PELVIS WITHOUT CONTRAST  TECHNIQUE: Multidetector CT imaging of the abdomen and pelvis was performed following the standard protocol without intravenous contrast.  COMPARISON:  US RENAL dated 04/08/2014; SP PERCUT GASTROSTOMY TUBE INSERT W/FLUORO dated 07/24/2013; CT ABD/PELV WO CM dated 06/20/2013  FINDINGS: There is a new moderate size dependent left pleural effusion with associated left lower lobe atelectasis. No significant right pleural or pericardial effusion is seen. Nodular pleural thickening along the right hemidiaphragm appears progressive.  As demonstrated on today CT, there is new bilateral hydronephrosis. Both ureters are dilated into the pelvis and appear obstructed by a large pelvic mass. Previously, this has been described as 2 separate masses, now more coalescent in the midline and difficult to differentiate. The right-sided component measures approximately 6.8 x 5.8 cm on image 54 and the left sided component approximately 5.1 x 3.9 cm. 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  There is evidence of progressive peritoneal carcinomatosis with irregular nodularity along the liver and increased nodularity throughout the mesenteric/omental fat. For example, there is a 3.4 cm left omental nodule on image 45 which previously measured 2.6 cm. There is a small amount of ascites which has increased in volume. There is also asymmetric perinephric  soft tissue stranding on the left, likely related to the ureteral obstruction.  Percutaneous G tube is in place. No signs of bowel obstruction or extravasation of enteric contrast identified. The pelvic mass is difficult to separate from the rectosigmoid status post diverting colostomy.  There is diffuse hepatic steatosis which appears progressive. The underlying intrinsic liver lesions are less well differentiated. The spleen, pancreas and adrenal glands demonstrate no significant findings.  There are no worrisome osseous findings.  IMPRESSION: 1. Progressive peritoneal carcinomatosis with enlargement of dominant pelvic  mass causing bilateral distal ureteral obstruction. 2. No signs of bowel obstruction or extravasation identified. 3. New moderate size left pleural effusion with left lower lobe atelectasis.   Electronically Signed   By: Camie Patience M.D.   On: 04/08/2014 15:02   US Renal  04/08/2014   CLINICAL DATA:  History of metastatic carcinoid tumor. Acute renal failure. Rule out hydronephrosis.  EXAM: RENAL/URINARY TRACT ULTRASOUND COMPLETE  COMPARISON:  CT of 06/20/2013  FINDINGS: Right Kidney:  Length: 11.4 cm. Mild renal cortical thinning. Moderate to marked hydronephrosis.  Left Kidney:  Length: 11.6 cm. Mild renal cortical thinning. Moderate hydronephrosis.  Bladder:  Decompressed.  No bladder distention identified.  IMPRESSION: Bilateral, right greater than left hydronephrosis. Given the clinical scenario, suspicious for distal obstruction. Case was discussed with Dr. Benay Spice, the patient, and the patient's daughter all on the afternoon of 04/08/2014. A CT is pending.   Electronically Signed   By: Abigail Miyamoto M.D.   On: 04/08/2014 14:29   AM labs reviewed.   Assessment: s/p Procedure(s): CYSTOSCOPY WITH BILATERAL  RETROGRADE PYELOGRAM,  AND  BILATERAL  STENT PLACEMENT ARI secondary to bilateral ureteral obstruction from carcinoid mass and a left ureteral stone.   She is doing well post  stenting with pain relief and improved UOP and Cr.  Plan: I have suggested consideration of bilateral stent change in about a month.  I was only able to get a 4.36fr up on the right and would like to increase the size if possible and she has the left ureteral stone that I would like to remove if possible.   All of this depends on her prognosis and if that is poor, we would not need to change the stents for about 3 months unless she is having problems.   CC: Dr. Julieanne Manson.     LOS: 1 day    Irine Seal 04/09/2014

## 2014-04-09 NOTE — Progress Notes (Signed)
IP PROGRESS NOTE  Subjective:   She underwent placement of bilateral ureter stents last night. She has noted increased urine output and pain improvement since the procedure.  Objective: Vital signs in last 24 hours: Blood pressure 148/99, pulse 75, temperature 97.9 F (36.6 C), temperature source Oral, resp. rate 12, height 5\' 3"  (1.6 m), weight 131 lb 6.3 oz (59.6 kg), SpO2 100.00%.  Intake/Output from previous day: 04/20 0701 - 04/21 0700 In: 1581.3 [I.V.:1581.3] Out: 700 [Urine:700]  Physical Exam: Lungs: Decreased breath sounds at the bases, no respiratory distress Cardiac: Regular rate and rhythm Abdomen: No hepatomegaly, the colostomy bag is empty, decreased tenderness in the lower abdomen Extremities: Trace edema at the left lower leg   Portacath/PICC-without erythema  Lab Results:  Recent Labs  04/08/14 2045 04/09/14 0400  WBC 8.7 7.0  HGB 8.9* 8.7*  HCT 27.3* 27.4*  PLT 239 217    BMET  Recent Labs  04/08/14 1456 04/08/14 2045 04/09/14 0400  NA 150*  --  148*  K 4.9  --  4.8  CL  --   --  111  CO2 23  --  23  GLUCOSE 150*  --  148*  BUN 68.5*  --  64*  CREATININE 5.4 Repeated and Verified* 5.82* 5.04*  CALCIUM 10.2  --  9.1    Studies/Results: Ct Abdomen Pelvis Wo Contrast  04/08/2014   CLINICAL DATA:  History of metastatic carcinoid tumor with carcinomatosis. Progressive renal insufficiency with bilateral hydronephrosis on ultrasound today.  EXAM: CT ABDOMEN AND PELVIS WITHOUT CONTRAST  TECHNIQUE: Multidetector CT imaging of the abdomen and pelvis was performed following the standard protocol without intravenous contrast.  COMPARISON:  US RENAL dated 04/08/2014; SP PERCUT GASTROSTOMY TUBE INSERT W/FLUORO dated 07/24/2013; CT ABD/PELV WO CM dated 06/20/2013  FINDINGS: There is a new moderate size dependent left pleural effusion with associated left lower lobe atelectasis. No significant right pleural or pericardial effusion is seen. Nodular pleural  thickening along the right hemidiaphragm appears progressive.  As demonstrated on today CT, there is new bilateral hydronephrosis. Both ureters are dilated into the pelvis and appear obstructed by a large pelvic mass. Previously, this has been described as 2 separate masses, now more coalescent in the midline and difficult to differentiate. The right-sided component measures approximately 6.8 x 5.8 cm on image 54 and the left sided component approximately 5.1 x 3.9 cm. 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  There is evidence of progressive peritoneal carcinomatosis with irregular nodularity along the liver and increased nodularity throughout the mesenteric/omental fat. For example, there is a 3.4 cm left omental nodule on image 45 which previously measured 2.6 cm. There is a small amount of ascites which has increased in volume. There is also asymmetric perinephric soft tissue stranding on the left, likely related to the ureteral obstruction.  Percutaneous G tube is in place. No signs of bowel obstruction or extravasation of enteric contrast identified. The pelvic mass is difficult to separate from the rectosigmoid status post diverting colostomy.  There is diffuse hepatic steatosis which appears progressive. The underlying intrinsic liver lesions are less well differentiated. The spleen, pancreas and adrenal glands demonstrate no significant findings.  There are no worrisome osseous findings.  IMPRESSION: 1. Progressive peritoneal carcinomatosis with enlargement of dominant pelvic mass causing bilateral distal ureteral obstruction. 2. No signs of bowel obstruction or extravasation identified. 3. New moderate size left pleural effusion with left lower lobe  atelectasis.   Electronically Signed   By: Camie Patience M.D.   On: 04/08/2014 15:02   US Renal  04/08/2014   CLINICAL DATA:  History of metastatic carcinoid tumor. Acute renal  failure. Rule out hydronephrosis.  EXAM: RENAL/URINARY TRACT ULTRASOUND COMPLETE  COMPARISON:  CT of 06/20/2013  FINDINGS: Right Kidney:  Length: 11.4 cm. Mild renal cortical thinning. Moderate to marked hydronephrosis.  Left Kidney:  Length: 11.6 cm. Mild renal cortical thinning. Moderate hydronephrosis.  Bladder:  Decompressed.  No bladder distention identified.  IMPRESSION: Bilateral, right greater than left hydronephrosis. Given the clinical scenario, suspicious for distal obstruction. Case was discussed with Dr. Benay Spice, the patient, and the patient's daughter all on the afternoon of 04/08/2014. A CT is pending.   Electronically Signed   By: Abigail Miyamoto M.D.   On: 04/08/2014 14:29    Medications: I have reviewed the patient's current medications.  Assessment/Plan:  1. Metastatic carcinoid tumor with carcinomatosis prompting multiple bowel resection procedures, status post a palliative sigmoid colectomy/colostomy, right colectomy, and gastrojejunostomy in April 2009, status post placement of a palliative gastrostomy tube 07/24/2013  2. Acute renal failure secondary to an obstructing pelvic mass and a left ureter stone, status post placement of bilateral ureter stents 04/08/2014  3. Gross hematuria-likely secondary to the left ureter stone 4. Chronic left leg edema-negative Doppler 11/03/2012  5. Nutrition-maintained on home TNA  6. Right scalp herpes zoster rash July 2013  7. Pain secondary to progressive carcinomatosis and urinary obstruction, improved today    She appears improved after the procedure last night. She will likely be able to go home 04/10/2014 if the creatinine continues to improve. I will discuss the case with Dr. Jeffie Pollock. Her long-term prognosis is poor, there is progressive carcinomatosis on the CT 04/08/2014.   LOS: 1 day   Ladell Pier  04/09/2014, 1:51 PM

## 2014-04-09 NOTE — Progress Notes (Signed)
INITIAL NUTRITION ASSESSMENT  DOCUMENTATION CODES Per approved criteria  -Not Applicable   INTERVENTION: -TPN per pharmacy -Continue with liberalized Regular diet -Carnation Instant Breakfast 8PM nourishment -Will continue to monitor  NUTRITION DIAGNOSIS: Altered GI function related to alteration in GI structure/motiltiy as evidenced by nausea/abd pain, reliance on TPN   Goal: Pt to meet >/= 90% of their estimated nutrition needs    Monitor:  Total protein/energy intake, supplement tolerance, Gi profile, labs, weights  Reason for Assessment: Consult/MST  68 y.o. female  Admitting Dx: <principal problem not specified>  ASSESSMENT: Lori Dennis is a 68 y.o. female with PMH of HTN, CKD, metastatic carcinoid tumor s/p chemo, h/o SBO s/p small bowel, colon resection colostomy, gastrojejunostomy (2009) on IV TPN presented with worsening of intermittent nausea, abd pain associated with hematuria seen by oncology today and found to have new enlarged pelvic mass with obstructive nephropathy, AKI; Patient had few episodes of nausea, intermittent diffuse lower abdominal pain  -Pt reported tolerated TPN at home. Weight has stayed relatively stable, usual weight is around 124 lbs. Current weight is likely related to edema/fluid retention. Has hx of weight loss r/t colon cancer; however has been able to maintain since beginning home TPN regimen -Diet recall indicates pt able to eat soft starchy foods. Can tolerate applesauce, mashed potatoes, crackers/toast if chewed well, yogurt. Cannot tolerate protein foods as they are not broken down enough and block colostomy. Will occasionally be able to tolerate small amount of peanut butter.  -Has tried Ensure/Boost supplementation in past, but dislikes taste. Was willing to try [employer] as additional source of nutrition -Pharmacy noted pt with home TPN cycle providing average of  1406 kcal/day, 50gm protein/day, 14 gm lipids/day.  TPN runs in a cyclic administration of 2.1L in 16 hours -Pt with CKD2 and AKI, elevated Crt/BUN upon admit. Is s/p bilateral stent placement, with some improvement in Crt. Will monitor protein needs, TNA to provide approximately 0.8g/kg/day upon initiation. -Pharm recommended Clinimix E5/20 at 40 ml/hr with 20% lipids MWF will provide an average of 1051 Kcal/day (68% est kcal needs), 48 g protein/day (96% est protein needs), ~1L fluid/day  -Pt ate approximately 50% of breakfast-toast, applesauce, juice, yogurt. Clinimix E5/20 at 40 ml/hr + PO intake + supplement intake will likely meet estimated nutrition needs -Will monitor PO intake and recommended adjustments in TPN goal rate as warranted   Height: Ht Readings from Last 1 Encounters:  04/08/14 5\' 3"  (1.6 m)    Weight: Wt Readings from Last 1 Encounters:  04/09/14 131 lb 6.3 oz (59.6 kg)    Ideal Body Weight: 115 lbs  % Ideal Body Weight: 114%  Wt Readings from Last 10 Encounters:  04/09/14 131 lb 6.3 oz (59.6 kg)  04/09/14 131 lb 6.3 oz (59.6 kg)  04/09/14 131 lb 6.3 oz (59.6 kg)  03/14/14 127 lb 8 oz (57.834 kg)  01/31/14 125 lb (56.7 kg)  12/26/13 124 lb 14.4 oz (56.654 kg)  11/22/13 125 lb 1.6 oz (56.745 kg)  10/11/13 121 lb 3.2 oz (54.976 kg)  09/12/13 121 lb 4.8 oz (55.021 kg)  09/07/13 118 lb 11.2 oz (53.842 kg)    Usual Body Weight: 124 lbs  % Usual Body Weight: 106%  BMI:  Body mass index is 23.28 kg/(m^2).  Estimated Nutritional Needs: Kcal: 1550-1750 Protein: 50-65 gram Fluid: >/=1750 ml/day   Skin: +2 LLE edema, +1 RLE edema  Diet Order: General  EDUCATION NEEDS: -No education needs identified at this time  Intake/Output Summary (Last 24 hours) at 04/09/14 0921 Last data filed at 04/09/14 3007  Gross per 24 hour  Intake 1581.25 ml  Output    700 ml  Net 881.25 ml    Last BM: 4/20   Labs:   Recent Labs Lab 04/08/14 1456 04/08/14 2045 04/09/14 0400  NA 150*  --  148*  K 4.9  --  4.8   CL  --   --  111  CO2 23  --  23  BUN 68.5*  --  64*  CREATININE 5.4 Repeated and Verified* 5.82* 5.04*  CALCIUM 10.2  --  9.1  MG  --   --  2.0  PHOS  --   --  4.9*  GLUCOSE 150*  --  148*    CBG (last 3)   Recent Labs  04/09/14 0641  GLUCAP 120*    Scheduled Meds: . ciprofloxacin  200 mg Intravenous Q12H  . heparin  5,000 Units Subcutaneous 3 times per day  . insulin aspart  0-9 Units Subcutaneous 4 times per day  . sodium chloride  3 mL Intravenous Q12H    Continuous Infusions: . dextrose 5 % and 0.45% NaCl 75 mL/hr at 04/09/14 0027  . fat emulsion    . Marland KitchenTPN (CLINIMIX-E) Adult      Past Medical History  Diagnosis Date  . Complication of anesthesia     slow to wake up  . Blood transfusion without reported diagnosis 2009 and 2011  . Hypertension     off all bp meds since Dec 20, 2012  . Chronic kidney disease 12-19-2012    elevated bun and creatinine   . Cancer 02/19/2008    Metastatic carcinoid tumor    Past Surgical History  Procedure Laterality Date  . Bowel resection  06/08/2010    ileocolonic resection, enteroenterostomy  . Left colectomy  04/02/2008    Exploratory laparotomy, biopsy of omental nodule, sigmoid colectomy  . Vaginal hysterectomy      at age 53  . Cholecystectomy      age 68  . Portacath placement Right 02/06/2013    Procedure: Ultrasound guided PORT-A-CATH INSERTION with flouro;  Surgeon: [name], MD;  Location: [facility];  Service: General;  Laterality: Right;  . Esophagogastroduodenoscopy N/A 06/28/2013    Procedure: ESOPHAGOGASTRODUODENOSCOPY (EGD);  Surgeon: Winfield Cunas., MD;  Location: [facility];  Service: Endoscopy;  Laterality: N/A;    Atlee Abide MS RD LDN Clinical Dietitian [phone]

## 2014-04-09 NOTE — Progress Notes (Signed)
[facility] PROGRESS NOTE  Lori Dennis QQV:[id number] DOB: May 14, 1946 DOA: 04/08/2014 PCP: Irven Shelling, MD  Brief narrative: 68 y.o. female with past medical history of hypertension, metastatic carcinoid tumor status post chemotherapy, SBO status post multiple bowel resection procedures, status post colostomy, on IV TPN for nutritional support who presented to [facility] ED 04/08/2014 with nausea, abdominal pain, hematuria. She was seen by oncology prior to this admission and was found to have pelvic mass with obstructive nephropathy. She underwent bilateral ureter stent placement 4/20. Her renal function is slowly improving.    Assessment/Plan:  Principal Problem: Abdominal pain, nausea, gross hematuria secondary or obstructive pelvic mass causing obstructive nephropathy - her pain and nausea has improved. She has good urine output - bilateral ureter stent placement by GU on 4/20. - continue to monitor renal function  - may continue supportive care with IV fluids, antiemetics, analgesia - diet: TPN  Active Problems: Metastatic carcinoid tumor with carcinomatosis  - status post multiple bowel resection procedures, status post a palliative sigmoid colectomy/colostomy, right colectomy, and gastrojejunostomy in April 2009, status post placement of a palliative gastrostomy tube 07/24/2013  - appreciate oncology following  Acute renal failure, hypernatremia - secondary to dehydration and due to an obstructing pelvic mass and a left ureter stone, status post placement of bilateral ureter stents 04/08/2014  Anemia of chronic disease - secondary to history of malignancy - continue to monitor CBC - hemoglobin is 8.7 - no current indications for transfusion Severe protein calorie malnutrition - due to history of malignancy - continue TNA  Code Status: full code Family Communication: daughter at the bedside  Disposition Plan: home when stable  Robbie Lis, MD  [facility] Pager [phone]  If 7PM-7AM, please contact night-coverage [email or web address] Password [email or web address] 04/09/2014, 4:32 PM   LOS: 1 day   Consultants:  Urology  Oncology (Dr. Benay Spice)  Procedures:  Bilateral ureteral stents placed 04/08/2014  Antibiotics:  None   HPI/Subjective: Says she feels better this am.  Objective: Filed Vitals:   04/09/14 0637 04/09/14 0639 04/09/14 0700 04/09/14 1458  BP: 148/99   152/68  Pulse: 75   80  Temp: 97.9 F (36.6 C)   98.2 F (36.8 C)  TempSrc: Oral   Oral  Resp: 12   18  Height:      Weight:   59.6 kg (131 lb 6.3 oz)   SpO2: 100% 100%  97%    Intake/Output Summary (Last 24 hours) at 04/09/14 1632 Last data filed at 04/09/14 1458  Gross per 24 hour  Intake 1701.25 ml  Output   1100 ml  Net 601.25 ml    Exam:   General:  Pt is alert, follows commands appropriately, not in acute distress  Cardiovascular: Regular rate and rhythm, S1/S2 appreciated  Respiratory: Clear to auscultation bilaterally, no wheezing, no crackles  Abdomen: Soft, non tender, has G tube in place as well as colostomy (no output seen today, it was drained 4/20)  Extremities: No edema, pulses DP and PT palpable bilaterally  Neuro: Grossly nonfocal  Data Reviewed: Basic Metabolic Panel:  Recent Labs Lab 04/08/14 1456 04/08/14 2045 04/09/14 0400  NA 150*  --  148*  K 4.9  --  4.8  CL  --   --  111  CO2 23  --  23  GLUCOSE 150*  --  148*  BUN 68.5*  --  64*  CREATININE 5.4 Repeated and Verified* 5.82* 5.04*  CALCIUM 10.2  --  9.1  MG  --   --  2.0  PHOS  --   --  4.9*   Liver Function Tests:  Recent Labs Lab 04/08/14 1456 04/09/14 0400  AST 24 22  ALT 14 15  ALKPHOS 249* 214*  BILITOT 0.62 0.4  PROT 7.4 6.6  ALBUMIN 2.6* 2.2*   No results found for this basename: LIPASE, AMYLASE,  in the last 168 hours No results found for this basename: AMMONIA,  in the last 168 hours CBC:  Recent Labs Lab 04/08/14 2045 04/09/14 0400   WBC 8.7 7.0  NEUTROABS  --  6.2  HGB 8.9* 8.7*  HCT 27.3* 27.4*  MCV 89.8 90.4  PLT 239 217   Cardiac Enzymes: No results found for this basename: CKTOTAL, CKMB, CKMBINDEX, TROPONINI,  in the last 168 hours BNP: No components found with this basename: POCBNP,  CBG:  Recent Labs Lab 04/09/14 0641 04/09/14 1226  GLUCAP 120* 90    SURGICAL PCR SCREEN     Status: Abnormal   Collection Time    04/08/14  8:35 PM      Result Value Ref Range Status   MRSA, PCR NEGATIVE  NEGATIVE Final   Staphylococcus aureus POSITIVE (*) NEGATIVE Final     Studies: Ct Abdomen Pelvis Wo Contrast 04/08/2014    IMPRESSION: 1. Progressive peritoneal carcinomatosis with enlargement of dominant pelvic mass causing bilateral distal ureteral obstruction. 2. No signs of bowel obstruction or extravasation identified. 3. New moderate size left pleural effusion with left lower lobe atelectasis.    US Renal 04/08/2014     IMPRESSION: Bilateral, right greater than left hydronephrosis. Given the clinical scenario, suspicious for distal obstruction. Case was discussed with Dr. Benay Spice, the patient, and the patient's daughter all on the afternoon of 04/08/2014. A CT is pending.      Scheduled Meds: . insulin aspart  0-9 Units Subcutaneous 4 times per day  . sodium chloride  3 mL Intravenous Q12H   Continuous Infusions: . dextrose 5 % and 0.45% NaCl 75 mL/hr at 04/09/14 1359  . fat emulsion    . Marland KitchenTPN (CLINIMIX-E) Adult

## 2014-04-09 NOTE — Progress Notes (Signed)
PARENTERAL NUTRITION CONSULT NOTE - INITIAL  Pharmacy Consult for TNA Indication: intolerance to enteral feedings  No Known Allergies  Patient Measurements: Height: 5\' 3"  (160 cm) Weight: 131 lb 6.3 oz (59.6 kg) IBW/kg (Calculated) : 52.4  Vital Signs: Temp: 97.9 F (36.6 C) (04/21 1155) Temp src: Oral (04/21 0637) BP: 148/99 mmHg (04/21 0637) Pulse Rate: 75 (04/21 0637) Intake/Output from previous day: 04/20 0701 - 04/21 0700 In: 1581.3 [I.V.:1581.3] Out: 700 [Urine:700] Intake/Output from this shift:    Labs:  Recent Labs  04/08/14 2045 04/09/14 0400  WBC 8.7 7.0  HGB 8.9* 8.7*  HCT 27.3* 27.4*  PLT 239 217     Recent Labs  04/08/14 1456 04/08/14 2045 04/09/14 0400  NA 150*  --  148*  K 4.9  --  4.8  CL  --   --  111  CO2 23  --  23  GLUCOSE 150*  --  148*  BUN 68.5*  --  64*  CREATININE 5.4 Repeated and Verified* 5.82* 5.04*  CALCIUM 10.2  --  9.1  MG  --   --  2.0  PHOS  --   --  4.9*  PROT 7.4  --  6.6  ALBUMIN 2.6*  --  2.2*  AST 24  --  22  ALT 14  --  15  ALKPHOS 249*  --  214*  BILITOT 0.62  --  0.4  TRIG  --   --  58   Estimated Creatinine Clearance: 9 ml/min (by C-G formula based on Cr of 5.04).    Recent Labs  04/09/14 0641  GLUCAP 120*    Medical History: Past Medical History  Diagnosis Date  . Complication of anesthesia     slow to wake up  . Blood transfusion without reported diagnosis 2009 and 2011  . Hypertension     off all bp meds since Dec 20, 2012  . Chronic kidney disease 12-19-2012    elevated bun and creatinine   . Cancer 02/19/2008    Metastatic carcinoid tumor    Medications:  Scheduled:  . ciprofloxacin  200 mg Intravenous Q12H  . heparin  5,000 Units Subcutaneous 3 times per day  . insulin aspart  0-9 Units Subcutaneous 4 times per day  . sodium chloride  3 mL Intravenous Q12H   Infusions:  . dextrose 5 % and 0.45% NaCl 75 mL/hr at 04/09/14 0027    Insulin Requirements in the past 24 hours:  0  units, sensitive SSI to start today with TNA  Current Nutrition:  NPO  IVF: D5W/1/2NS @ 62ml/hr  Assessment: [name] admitted 4/20  with worsening of intermittent nausea, abd pain associated with hematuria seen by oncology today and found to have new enlarged pelvic mass with obstructive nephropathy, AKI. Pt with PMH of HTN, CKD, metastatic carcinoid tumor s/p chemo, h/o SBO s/p small bowel, colon resection colostomy, gastrojejunostomy (2009) on IV TPN.  S/p cystoscopy with BL stent placement 4/20. Pharmacy has been consulted to continue TNA while inpatient. Patient did not come in with home TNA bag.  Started on IVF per [facility] MD and TNA will be started on 4/21.   Home TNA provided by [facility]: 2 days/week: 1550 kcal/d, 50 gm protein/d, 50 gm 30% fat emulsion 5 days/week: 1049 kcal/d, 50 gm protein/d, no fats TNA delivered as a cyclic administration: 2.0E/Y delivered over 16 hours including 1 hour ramp up and 1 hour ramp down  4/21: POD1 cystoscopy with BL stent placement, remains with  left ureteral stone.   Urology recommends stent exchange/stone removal in 1 month depending on pt progress.   Will resume TNA inpatient and initially start pt at 24 hour continuous infusion TNA.  As pt clinically improves, will consider advancing back to home cyclic 16 hour infusion regimen. Discussed plan with family and pt agreeable, hopeful for early discharge.   Labs: Electrolytes: Na slightly elevated @ 148, Corr Ca 10.5, upper threshold of normal, Phos elevated at 4.9, Mg WNL.  Will resume TNA today with electrolytes, but may need to remove lytes a few days/week if Na, Ca, and Phos remain high.  Renal Function: AoCKD - SCr improved 5.8-->5.04, MD notes increased UOP but urine is bloody.   Will resume TNA with ~0.8g/kg/day protein/day and assess need to adjust protein daily.   Hepatic Function: LFTs WNL Pre-Albumin: Pending Triglycerides: WNL; 58 (4/21) CBGs: 120-150, remains WNL   TNA day#: continued from  home TNA access: PAC  Nutritional Goals:  RD recommendations pending Home TNA provides avg/day Sun-Sat with lipids 2 days/week =1406 kcal/day, 50gm protein/day, 14 gm lipids/day Clinimix E5/20 at 40 ml/hr with 20% lipids MWF will provide an average of 1051 Kcal/day, 48 g protein/day, ~1L fluid/day  Plan:  At 1800 today:  Start E5/20 Clinimix at 15ml/hr with 20% lipids at 10 ml/hr MWF and today (will add lipids today since pt missed yesterday's bag of home TNA)  Plan to advance as tolerated to goal rate to match RD recommendations when available.   TNA to contain standard multivitamins and trace elements(MWF only due to ongoing shortage).  Famotidine 40mg  IV to be added to TNA daily as per home TNA regimen  Reduce IVF to 69ml/hr.   TNA lab panels on Mondays & Thursdays.  CMET tomorrow  Continue sensitive scale SSI with CBG checks q6h  Follow-up RD recommendations  Follow-up daily  Thank you for the consult. Ralene Bathe, PharmD, BCPS 04/09/2014, 7:47 AM  Pager: [phone]

## 2014-04-09 NOTE — Care Management Note (Signed)
    Page 1 of 1   04/09/2014     11:45:25 AM CARE MANAGEMENT NOTE 04/09/2014  Patient:  Lori Dennis, Lori Dennis   Account Number:  [id number]  Date Initiated:  04/09/2014  Documentation initiated by:  Dessa Phi  Subjective/Objective Assessment:   68 Y/O F ADMITTED W/N/V/ABD PAIN.HX:SBO/RESECTION/COLOSTOMY,CARCINOID MASS,L URETERAL STONE.     Action/Plan:   FROM HOME.ACTIVE [facility] HHRN/TNA.   Anticipated DC Date:  04/12/2014   Anticipated DC Plan:  [place]  CM consult      [facility] Hospital Choice  Resumption Of Svcs/PTA Provider   Choice offered to / List presented to:             Status of service:  In process, will continue to follow Medicare Important Message given?   (If response is "NO", the following Medicare IM given date fields will be blank) Date Medicare IM given:   Date Additional Medicare IM given:    Discharge Disposition:    Per UR Regulation:  Reviewed for med. necessity/level of care/duration of stay  If discussed at [place] of Stay Meetings, dates discussed:    Comments:  04/09/14 Lakeithia Rasor RN,BSN NCM [phone] POD#1 CYSTO/STENT.CONFIRMED [facility] REP [place] W/HHRN/TNA-IF PLAN IS FOR HOME, & RESUMPTION OF HHRN/TNA-PLEASE PUT IN [place] @ D/C.

## 2014-04-10 ENCOUNTER — Telehealth: Payer: Self-pay | Admitting: *Deleted

## 2014-04-10 ENCOUNTER — Encounter (HOSPITAL_COMMUNITY): Payer: Self-pay | Admitting: Urology

## 2014-04-10 LAB — COMPREHENSIVE METABOLIC PANEL
ALT: 16 U/L (ref 0–35)
AST: 28 U/L (ref 0–37)
Albumin: 2.1 g/dL — ABNORMAL LOW (ref 3.5–5.2)
Alkaline Phosphatase: 189 U/L — ABNORMAL HIGH (ref 39–117)
BILIRUBIN TOTAL: 0.3 mg/dL (ref 0.3–1.2)
BUN: 50 mg/dL — ABNORMAL HIGH (ref 6–23)
CO2: 26 meq/L (ref 19–32)
CREATININE: 3.52 mg/dL — AB (ref 0.50–1.10)
Calcium: 8.1 mg/dL — ABNORMAL LOW (ref 8.4–10.5)
Chloride: 105 mEq/L (ref 96–112)
GFR calc Af Amer: 14 mL/min — ABNORMAL LOW (ref >90)
GFR, EST NON AFRICAN AMERICAN: 12 mL/min — AB (ref >90)
Glucose, Bld: 134 mg/dL — ABNORMAL HIGH (ref 70–99)
POTASSIUM: 3.7 meq/L (ref 3.7–5.3)
Sodium: 141 mEq/L (ref 137–147)
Total Protein: 5.9 g/dL — ABNORMAL LOW (ref 6.0–8.3)

## 2014-04-10 LAB — GLUCOSE, CAPILLARY: GLUCOSE-CAPILLARY: 104 mg/dL — AB (ref 70–99)

## 2014-04-10 MED ORDER — HEPARIN SOD (PORK) LOCK FLUSH 100 UNIT/ML IV SOLN
500.0000 [IU] | INTRAVENOUS | Status: AC | PRN
  Administered 2014-04-10: 500 [IU]

## 2014-04-10 NOTE — Progress Notes (Signed)
Pt leaving at this time with her daughter at her side. Alert, oriented, and without c/o. Discharge instructions given/explained with pt/daughter verbalizing understanding. Pt states she is already set-up with [place] and has her Pump, TPN, and Lipids at home.  Colostomy and G-tube care in history.

## 2014-04-10 NOTE — Progress Notes (Signed)
Patient ID: Lori Dennis, female   DOB: [birth year], 68 y.o.   MRN: [id number] 2 Days Post-Op  Subjective: Lori Dennis continues to improve.  She has good UOP with reduced hematuria and her Cr continues to fall and is down to about 3.5.   She has no significant flank pain and just mild residual soreness.  ROS:  Review of Systems  Constitutional: Negative for fever and chills.  Gastrointestinal: Negative for nausea and vomiting.  Genitourinary: Positive for hematuria. Negative for flank pain.    Anti-infectives: Anti-infectives   Start     Dose/Rate Route Frequency Ordered Stop   04/09/14 0900  ciprofloxacin (CIPRO) IVPB 200 mg     200 mg 100 mL/hr over 60 Minutes Intravenous Every 12 hours 04/08/14 2324 04/09/14 0937   04/08/14 2005  ciprofloxacin (CIPRO) IVPB 200 mg     200 mg 100 mL/hr over 60 Minutes Intravenous 60 min pre-op 04/08/14 2005 04/08/14 2115      Current Facility-Administered Medications  Medication Dose Route Frequency Provider Last Rate Last Dose  . acetaminophen (TYLENOL) tablet 650 mg  650 mg Oral Q6H PRN Kinnie Feil, MD       Or  . acetaminophen (TYLENOL) suppository 650 mg  650 mg Rectal Q6H PRN Kinnie Feil, MD      . antiseptic oral rinse (BIOTENE) solution 15 mL  15 mL Mouth Rinse PRN Kinnie Feil, MD      . dextrose 5 %-0.45 % sodium chloride infusion   Intravenous Continuous Kinnie Feil, MD 75 mL/hr at 04/10/14 0321    . fat emulsion 20 % infusion 250 mL  250 mL Intravenous Continuous TPN Irven Shelling, MD 10 mL/hr at 04/09/14 1755 250 mL at 04/09/14 1755  . hydrALAZINE (APRESOLINE) injection 10 mg  10 mg Intravenous Q4H PRN Kinnie Feil, MD      . HYDROmorphone (DILAUDID) injection 0.5 mg  0.5 mg Intravenous Q4H PRN Kinnie Feil, MD   0.5 mg at 04/09/14 0921  . insulin aspart (novoLOG) injection 0-9 Units  0-9 Units Subcutaneous 4 times per day Irven Shelling, MD      . ondansetron [facility]) tablet 4 mg  4 mg Oral  Q6H PRN Kinnie Feil, MD   4 mg at 04/09/14 2128   Or  . ondansetron (ZOFRAN) injection 4 mg  4 mg Intravenous Q6H PRN Kinnie Feil, MD   4 mg at 04/09/14 1355  . oxyCODONE (ROXICODONE INTENSOL) 20 MG/ML concentrated solution 6-10 mg  6-10 mg Oral Q4H PRN Kinnie Feil, MD   10 mg at 04/09/14 2128  . phenazopyridine (PYRIDIUM) tablet 100 mg  100 mg Oral TID WC PRN Irine Seal, MD      . promethazine (PHENERGAN) injection 12.5 mg  12.5 mg Intravenous Q6H PRN Irine Seal, MD      . sodium chloride 0.9 % injection 10-40 mL  10-40 mL Intracatheter PRN Kinnie Feil, MD   10 mL at 04/10/14 0527  . sodium chloride 0.9 % injection 3 mL  3 mL Intravenous Q12H Kinnie Feil, MD      . TPN (CLINIMIX-E) Adult   Intravenous Continuous TPN Irven Shelling, MD 40 mL/hr at 04/09/14 1756     Facility-Administered Medications Ordered in Other Encounters  Medication Dose Route Frequency Provider Last Rate Last Dose  . sodium chloride 0.9 % injection 10 mL  10 mL Intravenous PRN Ladell Pier, MD   10 mL  at 04/08/14 1645     Objective: Vital signs in last 24 hours: Temp:  [98.2 F (36.8 C)-98.9 F (37.2 C)] 98.9 F (37.2 C) (04/22 0457) Pulse Rate:  [76-80] 76 (04/22 0457) Resp:  [16-18] 16 (04/22 0457) BP: (148-165)/(68-83) 155/77 mmHg (04/22 0530) SpO2:  [97 %] 97 % (04/22 0457) Weight:  [58.695 kg (129 lb 6.4 oz)-59.6 kg (131 lb 6.3 oz)] 58.695 kg (129 lb 6.4 oz) (04/22 0457)  Intake/Output from previous day: 04/21 0701 - 04/22 0700 In: 120 [P.O.:120] Out: 3050 [Urine:2300; Drains:750] Intake/Output this shift: Total I/O In: -  Out: 1950 [Urine:1400; Drains:550]   Physical Exam  Constitutional: She is oriented to person, place, and time.  Well developed, thin and in NAD  Abdominal: Soft. She exhibits no distension. There is no tenderness. There is no guarding.  Neurological: She is alert and oriented to person, place, and time.    Lab Results:   Recent Labs   04/08/14 2045 04/09/14 0400  WBC 8.7 7.0  HGB 8.9* 8.7*  HCT 27.3* 27.4*  PLT 239 217   BMET  Recent Labs  04/09/14 0400 04/10/14 0525  NA 148* 141  K 4.8 3.7  CL 111 105  CO2 23 26  GLUCOSE 148* 134*  BUN 64* 50*  CREATININE 5.04* 3.52*  CALCIUM 9.1 8.1*   PT/INR No results found for this basename: LABPROT, INR,  in the last 72 hours ABG No results found for this basename: PHART, PCO2, PO2, HCO3,  in the last 72 hours  Studies/Results: Ct Abdomen Pelvis Wo Contrast  04/08/2014   CLINICAL DATA:  History of metastatic carcinoid tumor with carcinomatosis. Progressive renal insufficiency with bilateral hydronephrosis on ultrasound today.  EXAM: CT ABDOMEN AND PELVIS WITHOUT CONTRAST  TECHNIQUE: Multidetector CT imaging of the abdomen and pelvis was performed following the standard protocol without intravenous contrast.  COMPARISON:  US RENAL dated 04/08/2014; SP PERCUT GASTROSTOMY TUBE INSERT W/FLUORO dated 07/24/2013; CT ABD/PELV WO CM dated 06/20/2013  FINDINGS: There is a new moderate size dependent left pleural effusion with associated left lower lobe atelectasis. No significant right pleural or pericardial effusion is seen. Nodular pleural thickening along the right hemidiaphragm appears progressive.  As demonstrated on today CT, there is new bilateral hydronephrosis. Both ureters are dilated into the pelvis and appear obstructed by a large pelvic mass. Previously, this has been described as 2 separate masses, now more coalescent in the midline and difficult to differentiate. The right-sided component measures approximately 6.8 x 5.8 cm on image 54 and the left sided component approximately 5.1 x 3.9 cm. 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  There is evidence of progressive peritoneal carcinomatosis with irregular nodularity along the liver and increased nodularity throughout the mesenteric/omental fat. For  example, there is a 3.4 cm left omental nodule on image 45 which previously measured 2.6 cm. There is a small amount of ascites which has increased in volume. There is also asymmetric perinephric soft tissue stranding on the left, likely related to the ureteral obstruction.  Percutaneous G tube is in place. No signs of bowel obstruction or extravasation of enteric contrast identified. The pelvic mass is difficult to separate from the rectosigmoid status post diverting colostomy.  There is diffuse hepatic steatosis which appears progressive. The underlying intrinsic liver lesions are less well differentiated. The spleen, pancreas and adrenal glands demonstrate no significant findings.  There are no worrisome osseous findings.  IMPRESSION: 1.  Progressive peritoneal carcinomatosis with enlargement of dominant pelvic mass causing bilateral distal ureteral obstruction. 2. No signs of bowel obstruction or extravasation identified. 3. New moderate size left pleural effusion with left lower lobe atelectasis.   Electronically Signed   By: Camie Patience M.D.   On: 04/08/2014 15:02   US Renal  04/08/2014   CLINICAL DATA:  History of metastatic carcinoid tumor. Acute renal failure. Rule out hydronephrosis.  EXAM: RENAL/URINARY TRACT ULTRASOUND COMPLETE  COMPARISON:  CT of 06/20/2013  FINDINGS: Right Kidney:  Length: 11.4 cm. Mild renal cortical thinning. Moderate to marked hydronephrosis.  Left Kidney:  Length: 11.6 cm. Mild renal cortical thinning. Moderate hydronephrosis.  Bladder:  Decompressed.  No bladder distention identified.  IMPRESSION: Bilateral, right greater than left hydronephrosis. Given the clinical scenario, suspicious for distal obstruction. Case was discussed with Dr. Benay Spice, the patient, and the patient's daughter all on the afternoon of 04/08/2014. A CT is pending.   Electronically Signed   By: Abigail Miyamoto M.D.   On: 04/08/2014 14:29   Labs reviewed.  Notes from Dr. Benay Spice and Dr. Doyle Askew reviewed.    Assessment: s/p Procedure(s): CYSTOSCOPY WITH BILATERAL  RETROGRADE PYELOGRAM,  AND  BILATERAL  STENT PLACEMENT  She continues to improve with a declining Cr and continued resolution of the pain and hematuria. Plan: She is ok for D/C at any time from my stand point. F/U with me in 3-4 weeks.      LOS: 2 days    Irine Seal 04/10/2014

## 2014-04-10 NOTE — Anesthesia Postprocedure Evaluation (Signed)
  Anesthesia Post-op Note  Patient: Lori Dennis  Procedure(s) Performed: Procedure(s) (LRB): CYSTOSCOPY WITH BILATERAL  RETROGRADE PYELOGRAM,  AND  BILATERAL  STENT PLACEMENT (Bilateral)  Patient Location: PACU  Anesthesia Type: General  Level of Consciousness: awake and alert   Airway and Oxygen Therapy: Patient Spontanous Breathing  Post-op Pain: mild  Post-op Assessment: Post-op Vital signs reviewed, Patient's Cardiovascular Status Stable, Respiratory Function Stable, Patent Airway and No signs of Nausea or vomiting  Last Vitals:  Filed Vitals:   04/10/14 0530  BP: 155/77  Pulse:   Temp:   Resp:     Post-op Vital Signs: stable   Complications: No apparent anesthesia complications

## 2014-04-10 NOTE — Discharge Summary (Signed)
Physician Discharge Summary  Patient ID: Lori Dennis MRN: [id number] DOB/AGE: Jun 24, 1946 68 y.o.  Admit date: 04/08/2014 Discharge date: 04/10/2014  Admission Diagnoses: Acute renal failure Obstructive uropathy Metastatic carcinoid tumor Hypernatremia Anemia of chronic disease  Discharge Diagnoses:  Principal Problem: Acute renal failure Active Problems: Obstructive uropathy   Metastatic carcinoid tumor   Hypernatremia   Anemia of chronic disease   Discharged Condition: good  Hospital Course: The patient was admitted on April 20 complaining of nausea abdominal pain and hematuria. She was seen by urology and felt to have bilateral ureteral obstruction secondary to carcinoid tumor with contribution from a possible distal left renal stone. The patient underwent cystoscopy and bilateral stent insertion. Her initial BUN and creatinine were 68 and 5.4. After stent placement she felt much better and creatinine was down to 3.5 to discharge. She was seen in consultation by urology and oncology. Her prognosis is guarded. She felt much better with resolution of nausea and abdominal pain after stent placement. At discharge sodium 141. She was maintained on TMA throughout the hospitalization  Consults: hematology/oncology and urology  Significant Diagnostic Studies: labs: At discharge sodium 141, potassium 2.7, BUN 52 creatinine 2.52, chloride 105, bicarbonate 26, albumin 2.1, alkaline phosphatase 189 and radiology: CT scan: He progressive peritoneal carcinomatosis with a large abdominopelvic mass causing bilateral distal ureteral obstruction and new moderate size left pleural effusion and Ultrasound: Bilateral right greater than left hydronephrosis  Treatments: IV hydration, TPN and procedures: Cystoscopy with bilateral ureteral stent placement  Discharge Exam: Blood pressure 155/77, pulse 76, temperature 98.9 F (37.2 C), temperature source Oral, resp. rate 16, height 5\' 3"  (1.6 m),  weight 58.695 kg (129 lb 6.4 oz), SpO2 97.00%. General appearance: alert and cooperative  Disposition: 01-Home or Self Care   Future Appointments Provider Department Dept Phone   04/23/2014 9:45 AM Ladell Pier, MD [facility] [phone]       Medication List         antiseptic oral rinse Liqd  15 mLs by Mouth Rinse route as needed (for dry mouth).     menthol-cetylpyridinium 3 MG lozenge  Commonly known as:  CEPACOL  Take 1 lozenge by mouth as needed for pain.     ondansetron 8 MG tablet  Commonly known as:  ZOFRAN  Take 8 mg by mouth daily as needed for nausea or vomiting. To control nausea     oxyCODONE 20 MG/ML concentrated solution  Commonly known as:  ROXICODONE INTENSOL  Take 0.3-0.5 mLs (6-10 mg total) by mouth every 4 (four) hours as needed for pain.     promethazine 6.25 MG/5ML syrup  Commonly known as:  PHENERGAN  Take 12.5 mg by mouth every 6 (six) hours as needed for nausea or vomiting.     promethazine 12.5 MG suppository  Commonly known as:  PHENERGAN  Place 1 suppository (12.5 mg total) rectally every 6 (six) hours as needed for nausea (if unable to tolerate po).     TPN ADULT  Inject 2,000 mLs into the vein continuous. 14 hours/day start between 2000-2100           Follow-up Information   Follow up with Malka So, MD. (Call for f/u for 3-4 weeks and cancel appt for next Monday. )    Specialty:  Urology   Contact information:   [facility]  [place] [address] [phone]       Signed: Irven Shelling 04/10/2014, 7:15  AM

## 2014-04-10 NOTE — Telephone Encounter (Signed)
Call from [facility] with [facility] pharmacy requesting order to resume TPN. Verbal order given, per Dr. Benay Spice to resume TPN.

## 2014-04-19 ENCOUNTER — Encounter: Payer: Self-pay | Admitting: *Deleted

## 2014-04-19 NOTE — Progress Notes (Signed)
Received labs from [facility] : BUN 30/ Creatinine 1.78     Hgb 8.6 Copy to MD desk

## 2014-04-23 ENCOUNTER — Ambulatory Visit (HOSPITAL_BASED_OUTPATIENT_CLINIC_OR_DEPARTMENT_OTHER): Payer: Medicare Other | Admitting: Oncology

## 2014-04-23 ENCOUNTER — Telehealth: Payer: Self-pay | Admitting: Oncology

## 2014-04-23 VITALS — BP 175/81 | HR 81 | Temp 97.1°F | Resp 19 | Ht 63.0 in | Wt 125.1 lb

## 2014-04-23 DIAGNOSIS — R609 Edema, unspecified: Secondary | ICD-10-CM

## 2014-04-23 DIAGNOSIS — C7A00 Malignant carcinoid tumor of unspecified site: Secondary | ICD-10-CM

## 2014-04-23 DIAGNOSIS — D3A098 Benign carcinoid tumors of other sites: Secondary | ICD-10-CM

## 2014-04-23 DIAGNOSIS — G893 Neoplasm related pain (acute) (chronic): Secondary | ICD-10-CM

## 2014-04-23 DIAGNOSIS — N179 Acute kidney failure, unspecified: Secondary | ICD-10-CM

## 2014-04-23 DIAGNOSIS — C7B8 Other secondary neuroendocrine tumors: Secondary | ICD-10-CM

## 2014-04-23 MED ORDER — FLUCONAZOLE 50 MG PO TABS: 50.0000 mg | ORAL_TABLET | Freq: Every day | ORAL | Status: DC

## 2014-04-23 NOTE — Progress Notes (Signed)
  [place] OFFICE PROGRESS NOTE   Diagnosis: Metastatic carcinoid tumor  INTERVAL HISTORY:   Lori Dennis was admitted 04/08/2014 with renal failure and obstructive nephropathy. She underwent bilateral ureter stent placement and was discharged 04/10/2014. She reports feeling much better. She continues to have low abdominal pain. She is able to clamp the gastrostomy tube and has noted some output from the colostomy. She continues home TNA.  Objective:  Vital signs in last 24 hours:  Blood pressure 175/81, pulse 81, temperature 97.1 F (36.2 C), temperature source Oral, resp. rate 19, height 5\' 3"  (1.6 m), weight 125 lb 1.6 oz (56.745 kg).    HEENT: Thick whitecoat over the tongue, no buccal thrush, the mouth is dry Resp: Lungs clear bilaterally Cardio: Regular rate and rhythm GI: Small amount of formed stool in the colostomy bag, gastrostomy tube site without evidence of infection, firm fullness in the right lower abdomen Vascular: Trace edema at the left greater than right lower leg  Portacath/PICC-without erythema  Lab Results: 04/19/2014-BUN 30, creatinine 1.78 Hemoglobin 8.6   Imaging:  No results found.  Medications: I have reviewed the patient's current medications.  Assessment/Plan: 1. Metastatic carcinoid tumor with carcinomatosis prompting multiple bowel resection procedures, status post a palliative sigmoid colectomy/colostomy, right colectomy, and gastrojejunostomy in April 2009, status post placement of a palliative gastrostomy tube 07/24/2013  2. Acute renal failure secondary to an obstructing pelvic mass and a left ureter stone, status post placement of bilateral ureter stents 04/08/2014 -improved 3. Gross hematuria-likely secondary to the left ureter stone  4. Chronic left leg edema-negative Doppler 11/03/2012  5. Nutrition-maintained on home TNA  6. Right scalp herpes zoster rash July 2013  7. Pain secondary to progressive carcinomatosis  and urinary obstruction-she continues oxycodone as needed  8. Oral candidiasis-she will complete a course of Diflucan  Disposition:  Lori Dennis has an improved performance status since undergoing placement of bilateral ureter stents. She will continue home TNA. We check a chemistry panel weekly.  She will see Dr. Jeffie Pollock to discuss exchange of the ureter stents and treatment of the left ureter stone.  Ladell Pier, MD  04/23/2014  10:49 AM

## 2014-04-23 NOTE — Telephone Encounter (Signed)
per pof to sch appt-gave pt copy of sch °

## 2014-04-26 ENCOUNTER — Telehealth: Payer: Self-pay | Admitting: *Deleted

## 2014-04-26 NOTE — Telephone Encounter (Signed)
Labs reviewed by Dr. Benay Spice: Have Lori Dennis increase hydration over the weekend. Fax results to Dr. Jeffie Pollock. Called Lori Dennis with instructions, spoke with daughter who voiced understanding. Stated Lori Dennis had been having pelvic pain-- better today. Instructed her to go to ED for persistent pain, fever or shaking chills. She voiced understanding.

## 2014-04-30 ENCOUNTER — Encounter: Payer: Self-pay | Admitting: Oncology

## 2014-04-30 ENCOUNTER — Telehealth: Payer: Self-pay | Admitting: Oncology

## 2014-04-30 NOTE — Telephone Encounter (Signed)
chge pt to Dr Sherrill/oversight-prev sch w/covering prov-corrected

## 2014-05-01 ENCOUNTER — Telehealth: Payer: Self-pay | Admitting: *Deleted

## 2014-05-01 NOTE — Telephone Encounter (Signed)
Called pt to follow up on elevated creatinine, increased hydration. She reports she took extra hydration x1, per Dr. Jeffie Pollock. Pt is scheduled to see Dr. Jeffie Pollock 5/18. States she has not had any bleeding. One episode of emesis, having some malaise but no real change.  Pt reports LLE is edematous, "twice the size" of R leg. "Not going down." Denies pain, stated it "just feels tight behind my knee." Encouraged pt to call office with any changes. Reviewed with Lattie Haw, NP: Order received for doppler of LLE to rule out DVT. Called pt with these instructions, she stated LLE is the same as when seen in office 04/23/14 does not want to have doppler repeated as it was done in March. Reviewed warning signs and risk related to thrombosis. Pt voiced understanding, stated she will call office if leg becomes painful, red or more swollen. Will make Dr. Benay Spice aware.

## 2014-05-02 ENCOUNTER — Other Ambulatory Visit: Payer: Self-pay | Admitting: *Deleted

## 2014-05-02 ENCOUNTER — Telehealth: Payer: Self-pay | Admitting: *Deleted

## 2014-05-02 ENCOUNTER — Encounter (HOSPITAL_COMMUNITY): Payer: Self-pay

## 2014-05-02 ENCOUNTER — Ambulatory Visit (HOSPITAL_BASED_OUTPATIENT_CLINIC_OR_DEPARTMENT_OTHER): Payer: Medicare Other

## 2014-05-02 ENCOUNTER — Inpatient Hospital Stay (HOSPITAL_COMMUNITY)
Admission: AD | Admit: 2014-05-02 | Discharge: 2014-05-06 | DRG: 683 | Disposition: A | Discharge status: Home Health | Payer: Medicare Other | Source: Ambulatory Visit | Attending: Internal Medicine | Admitting: Internal Medicine

## 2014-05-02 DIAGNOSIS — R609 Edema, unspecified: Secondary | ICD-10-CM | POA: Diagnosis present

## 2014-05-02 DIAGNOSIS — Z8041 Family history of malignant neoplasm of ovary: Secondary | ICD-10-CM

## 2014-05-02 DIAGNOSIS — N1330 Unspecified hydronephrosis: Secondary | ICD-10-CM | POA: Diagnosis present

## 2014-05-02 DIAGNOSIS — I129 Hypertensive chronic kidney disease with stage 1 through stage 4 chronic kidney disease, or unspecified chronic kidney disease: Secondary | ICD-10-CM | POA: Diagnosis present

## 2014-05-02 DIAGNOSIS — D631 Anemia in chronic kidney disease: Secondary | ICD-10-CM | POA: Diagnosis present

## 2014-05-02 DIAGNOSIS — Z933 Colostomy status: Secondary | ICD-10-CM

## 2014-05-02 DIAGNOSIS — N039 Chronic nephritic syndrome with unspecified morphologic changes: Secondary | ICD-10-CM

## 2014-05-02 DIAGNOSIS — N179 Acute kidney failure, unspecified: Secondary | ICD-10-CM

## 2014-05-02 DIAGNOSIS — Z79899 Other long term (current) drug therapy: Secondary | ICD-10-CM

## 2014-05-02 DIAGNOSIS — D3A098 Benign carcinoid tumors of other sites: Secondary | ICD-10-CM

## 2014-05-02 DIAGNOSIS — Z452 Encounter for adjustment and management of vascular access device: Secondary | ICD-10-CM

## 2014-05-02 DIAGNOSIS — N200 Calculus of kidney: Secondary | ICD-10-CM | POA: Diagnosis present

## 2014-05-02 DIAGNOSIS — I10 Essential (primary) hypertension: Secondary | ICD-10-CM | POA: Diagnosis present

## 2014-05-02 DIAGNOSIS — N184 Chronic kidney disease, stage 4 (severe): Secondary | ICD-10-CM

## 2014-05-02 DIAGNOSIS — C7A098 Malignant carcinoid tumors of other sites: Secondary | ICD-10-CM | POA: Diagnosis present

## 2014-05-02 DIAGNOSIS — C7B8 Other secondary neuroendocrine tumors: Secondary | ICD-10-CM

## 2014-05-02 DIAGNOSIS — Z9049 Acquired absence of other specified parts of digestive tract: Secondary | ICD-10-CM

## 2014-05-02 DIAGNOSIS — K56609 Unspecified intestinal obstruction, unspecified as to partial versus complete obstruction: Secondary | ICD-10-CM | POA: Diagnosis present

## 2014-05-02 DIAGNOSIS — R112 Nausea with vomiting, unspecified: Secondary | ICD-10-CM

## 2014-05-02 DIAGNOSIS — M7989 Other specified soft tissue disorders: Secondary | ICD-10-CM | POA: Diagnosis present

## 2014-05-02 DIAGNOSIS — Z86718 Personal history of other venous thrombosis and embolism: Secondary | ICD-10-CM

## 2014-05-02 DIAGNOSIS — N189 Chronic kidney disease, unspecified: Secondary | ICD-10-CM | POA: Diagnosis present

## 2014-05-02 DIAGNOSIS — D490 Neoplasm of unspecified behavior of digestive system: Secondary | ICD-10-CM

## 2014-05-02 DIAGNOSIS — D638 Anemia in other chronic diseases classified elsewhere: Secondary | ICD-10-CM | POA: Diagnosis present

## 2014-05-02 DIAGNOSIS — Z8249 Family history of ischemic heart disease and other diseases of the circulatory system: Secondary | ICD-10-CM

## 2014-05-02 DIAGNOSIS — C7A00 Malignant carcinoid tumor of unspecified site: Secondary | ICD-10-CM

## 2014-05-02 DIAGNOSIS — Z95828 Presence of other vascular implants and grafts: Secondary | ICD-10-CM

## 2014-05-02 LAB — COMPREHENSIVE METABOLIC PANEL (CC13)
ALBUMIN: 2.2 g/dL — AB (ref 3.5–5.0)
ALT: 11 U/L (ref 0–55)
AST: 17 U/L (ref 5–34)
Alkaline Phosphatase: 191 U/L — ABNORMAL HIGH (ref 40–150)
Anion Gap: 14 mEq/L — ABNORMAL HIGH (ref 3–11)
BUN: 69.6 mg/dL — ABNORMAL HIGH (ref 7.0–26.0)
CALCIUM: 11.6 mg/dL — AB (ref 8.4–10.4)
CHLORIDE: 111 meq/L — AB (ref 98–109)
CO2: 21 mEq/L — ABNORMAL LOW (ref 22–29)
Creatinine: 5.1 mg/dL (ref 0.6–1.1)
Glucose: 98 mg/dl (ref 70–140)
Potassium: 4.5 mEq/L (ref 3.5–5.1)
Sodium: 145 mEq/L (ref 136–145)
Total Bilirubin: 0.24 mg/dL (ref 0.20–1.20)
Total Protein: 6.7 g/dL (ref 6.4–8.3)

## 2014-05-02 LAB — CBC
HEMATOCRIT: 24.1 % — AB (ref 36.0–46.0)
Hemoglobin: 7.8 g/dL — ABNORMAL LOW (ref 12.0–15.0)
MCH: 28.5 pg (ref 26.0–34.0)
MCHC: 32.4 g/dL (ref 30.0–36.0)
MCV: 88 fL (ref 78.0–100.0)
Platelets: 230 10*3/uL (ref 150–400)
RBC: 2.74 MIL/uL — ABNORMAL LOW (ref 3.87–5.11)
RDW: 15.2 % (ref 11.5–15.5)
WBC: 6 10*3/uL (ref 4.0–10.5)

## 2014-05-02 LAB — CBC WITH DIFFERENTIAL/PLATELET
BASO%: 0.4 % (ref 0.0–2.0)
Basophils Absolute: 0 10*3/uL (ref 0.0–0.1)
EOS%: 2.2 % (ref 0.0–7.0)
Eosinophils Absolute: 0.2 10*3/uL (ref 0.0–0.5)
HCT: 26.6 % — ABNORMAL LOW (ref 34.8–46.6)
HEMOGLOBIN: 8.6 g/dL — AB (ref 11.6–15.9)
LYMPH%: 18.6 % (ref 14.0–49.7)
MCH: 28.8 pg (ref 25.1–34.0)
MCHC: 32.3 g/dL (ref 31.5–36.0)
MCV: 89 fL (ref 79.5–101.0)
MONO#: 0.4 10*3/uL (ref 0.1–0.9)
MONO%: 5.3 % (ref 0.0–14.0)
NEUT#: 5 10*3/uL (ref 1.5–6.5)
NEUT%: 73.5 % (ref 38.4–76.8)
Platelets: 262 10*3/uL (ref 145–400)
RBC: 2.99 10*6/uL — ABNORMAL LOW (ref 3.70–5.45)
RDW: 15.1 % — AB (ref 11.2–14.5)
WBC: 6.8 10*3/uL (ref 3.9–10.3)
lymph#: 1.3 10*3/uL (ref 0.9–3.3)

## 2014-05-02 LAB — CREATININE, SERUM
Creatinine, Ser: 5.19 mg/dL — ABNORMAL HIGH (ref 0.50–1.10)
GFR calc non Af Amer: 8 mL/min — ABNORMAL LOW (ref >90)
GFR, EST AFRICAN AMERICAN: 9 mL/min — AB (ref >90)

## 2014-05-02 LAB — MAGNESIUM: MAGNESIUM: 2.3 mg/dL (ref 1.5–2.5)

## 2014-05-02 LAB — PHOSPHORUS: Phosphorus: 6.3 mg/dL — ABNORMAL HIGH (ref 2.3–4.6)

## 2014-05-02 MED ORDER — MORPHINE SULFATE 2 MG/ML IJ SOLN: 1.0000 mg | INTRAMUSCULAR | Status: DC | PRN

## 2014-05-02 MED ORDER — ONDANSETRON HCL 4 MG/2ML IJ SOLN
4.0000 mg | Freq: Four times a day (QID) | INTRAMUSCULAR | Status: DC | PRN
Start: 2014-05-02 — End: 2014-05-06
  Administered 2014-05-05 (×2): 4 mg via INTRAVENOUS
  Filled 2014-05-02 (×2): qty 2

## 2014-05-02 MED ORDER — HEPARIN SODIUM (PORCINE) 5000 UNIT/ML IJ SOLN
5000.0000 [IU] | Freq: Three times a day (TID) | INTRAMUSCULAR | Status: DC
  Administered 2014-05-03 – 2014-05-06 (×8): 5000 [IU] via SUBCUTANEOUS
  Filled 2014-05-02 (×14): qty 1

## 2014-05-02 MED ORDER — SODIUM CHLORIDE 0.9 % IJ SOLN
10.0000 mL | INTRAMUSCULAR | Status: DC | PRN
  Administered 2014-05-02: 10 mL via INTRAVENOUS
  Filled 2014-05-02: qty 10

## 2014-05-02 MED ORDER — KCL IN DEXTROSE-NACL 20-5-0.45 MEQ/L-%-% IV SOLN
INTRAVENOUS | Status: AC
  Administered 2014-05-02 – 2014-05-03 (×2): via INTRAVENOUS
  Filled 2014-05-02 (×4): qty 1000

## 2014-05-02 MED ORDER — ACETAMINOPHEN 10 MG/ML IV SOLN
1000.0000 mg | Freq: Four times a day (QID) | INTRAVENOUS | Status: AC | PRN
  Administered 2014-05-03: 1000 mg via INTRAVENOUS
  Filled 2014-05-02 (×2): qty 100

## 2014-05-02 MED ORDER — HEPARIN SOD (PORK) LOCK FLUSH 100 UNIT/ML IV SOLN
500.0000 [IU] | Freq: Once | INTRAVENOUS | Status: AC
  Administered 2014-05-02: 500 [IU] via INTRAVENOUS
  Filled 2014-05-02: qty 5

## 2014-05-02 NOTE — Consult Note (Signed)
Urology Consult  Referring physician: Merwyn Katos Reason for referral: Acute renal failure  History of Present Illness: Lori Dennis is 68 years of age. She's had a very complicated history over the last 5-6 years. 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 She was recently seen in consultation by Dr. Jeffie Pollock approximately 3-4 weeks ago for acute renal failure with bilateral hydronephrosis. The patient was also felt to have a possible distal left ureteral calcification/calculus. Creatinine at the time of her assessment was 5.4. The patient underwent placement of bilateral double-J stents with retrograde pyelography. She had initial improvement and appears to tolerate her stents reasonably well. It is unclear to me where she was felt to have a ureteral calculus. Her creatinine did improve to approximately 3.5. Last week her creatinine was 3.0 but her daughter states it is improved further from that. Again it is now gone back up to 5.1. She is not having any definitive flank discomfort. She has had some increased fatigue. She is actually been able to do incredibly well despite this daunting battle with her cancer. She continues on TPN. She takes nothing per mouth. She has a gastric tube. She was to get a renal ultrasound which has not yet been performed.  Past Medical History  Diagnosis Date  . Complication of anesthesia     slow to wake up  . Blood transfusion without reported diagnosis 2009 and 2011  . Hypertension     off all bp meds since Dec 20, 2012  . Chronic kidney disease 12-19-2012    elevated bun and creatinine   . Cancer 02/19/2008    Metastatic carcinoid tumor   Past Surgical History  Procedure Laterality Date  . Bowel resection  06/08/2010    ileocolonic resection, enteroenterostomy  . Left colectomy  04/02/2008    Exploratory laparotomy, biopsy of omental nodule, sigmoid colectomy  . Vaginal hysterectomy      at age 31  . Cholecystectomy      age 61  . Portacath  placement Right 02/06/2013    Procedure: Ultrasound guided PORT-A-CATH INSERTION with flouro;  Surgeon: [name], MD;  Location: [facility];  Service: General;  Laterality: Right;  . Esophagogastroduodenoscopy N/A 06/28/2013    Procedure: ESOPHAGOGASTRODUODENOSCOPY (EGD);  Surgeon: Winfield Cunas., MD;  Location: [facility];  Service: Endoscopy;  Laterality: N/A;  . Cystoscopy with retrograde pyelogram, ureteroscopy and stent placement Bilateral 04/08/2014    Procedure: CYSTOSCOPY WITH BILATERAL  RETROGRADE PYELOGRAM,  AND  BILATERAL  STENT PLACEMENT;  Surgeon: Irine Seal, MD;  Location: [facility];  Service: Urology;  Laterality: Bilateral;    Medications:  Scheduled: . heparin  5,000 Units Subcutaneous 3 times per day    Allergies: No Known Allergies  Family History  Problem Relation Age of Onset  . Heart disease Mother   . Cancer Sister     ovarian    Social History:  reports that she has never smoked. She has never used smokeless tobacco. She reports that she does not drink alcohol or use illicit drugs.  ROS primarily pertinent positives and negatives in history of present illness. She does have left lower extremity edema. She apparently has been rolled up for DVT in the past. She is scheduled for repeat Doppler She is having no shortness of breath chest pain significant flank discomfort. She has had no gross hematuria.  Physical Exam:  Vital signs in last 24 hours:    Constitutional: Vital signs reviewed. WD WN in NAD Head: Normocephalic  and atraumatic   Eyes: PERRL, No scleral icterus.  Neck: Supple No  Gross JVD, mass, thyromegaly, or carotid bruit present.  Cardiovascular: RRR Pulmonary/Chest: Normal effort Abdominal: Soft. G-tube present Genitourinary: Not examined Extremities: Significant lower extremity asymmetry with left lower extremity lymphedema  Neurological: Grossly non-focal.  Skin: Warm,very dry and intact. No rash, cyanosis   Laboratory Data:   Results for orders placed in visit on 05/02/14 (from the past 72 hour(s))  CBC WITH DIFFERENTIAL     Status: Abnormal   Collection Time    05/02/14 12:57 PM      Result Value Ref Range   WBC 6.8  3.9 - 10.3 10e3/uL   NEUT# 5.0  1.5 - 6.5 10e3/uL   HGB 8.6 (*) 11.6 - 15.9 g/dL   HCT 26.6 (*) 34.8 - 46.6 %   Platelets 262  145 - 400 10e3/uL   MCV 89.0  79.5 - 101.0 fL   MCH 28.8  25.1 - 34.0 pg   MCHC 32.3  31.5 - 36.0 g/dL   RBC 2.99 (*) 3.70 - 5.45 10e6/uL   RDW 15.1 (*) 11.2 - 14.5 %   lymph# 1.3  0.9 - 3.3 10e3/uL   MONO# 0.4  0.1 - 0.9 10e3/uL   Eosinophils Absolute 0.2  0.0 - 0.5 10e3/uL   Basophils Absolute 0.0  0.0 - 0.1 10e3/uL   NEUT% 73.5  38.4 - 76.8 %   LYMPH% 18.6  14.0 - 49.7 %   MONO% 5.3  0.0 - 14.0 %   EOS% 2.2  0.0 - 7.0 %   BASO% 0.4  0.0 - 2.0 %  COMPREHENSIVE METABOLIC PANEL (KN39)     Status: Abnormal   Collection Time    05/02/14 12:57 PM      Result Value Ref Range   Sodium 145  136 - 145 mEq/L   Potassium 4.5  3.5 - 5.1 mEq/L   Chloride 111 (*) 98 - 109 mEq/L   CO2 21 (*) 22 - 29 mEq/L   Glucose 98  70 - 140 mg/dl   BUN 69.6 (*) 7.0 - 26.0 mg/dL   Creatinine 5.1 (*) 0.6 - 1.1 mg/dL   Total Bilirubin 0.24  0.20 - 1.20 mg/dL   Alkaline Phosphatase 191 (*) 40 - 150 U/L   AST 17  5 - 34 U/L   ALT 11  0 - 55 U/L   Total Protein 6.7  6.4 - 8.3 g/dL   Albumin 2.2 (*) 3.5 - 5.0 g/dL   Calcium 11.6 (*) 8.4 - 10.4 mg/dL   Anion Gap 14 (*) 3 - 11 mEq/L   No results found for this or any previous visit (from the past 240 hour(s)). Creatinine:  Recent Labs  05/02/14 1257  CREATININE 5.1*   Baseline Creatinine:   Impression/Assessment:  History of bilateral ureteral obstruction secondary to carcinoid tumor the patient had double-J stent was placed approximately a month ago. Her creatinine has now worsened. I suspect recurrent obstruction with poor drainage from her double-J stent. A renal ultrasound should be performed to check on the status of  hydronephrosis before embarking on double-J stent exchange. Patients with diffuse intra-abdominal malignancies often do not drained her kidneys well with double-J stent and at times percutaneous nephrostomy tubes are necessary. Given her situation is certainly reasonable to try to replace her stents with larger caliber 7 French stents bilaterally. She should also be assessed for the possibility of a concurrent ureteral stone. I discussed this with the patient and  her daughter. Dr. Jeffie Pollock will not be available tomorrow. Prior to her surgery she should have a renal ultrasound and also assessment of that left lower extremity for possible DVT. She will most likely require surgery tomorrow. This may need to be done by the on-call physician who I will discuss the case with.  Plan: As above   Bernestine Amass 05/02/2014, 5:46 PM

## 2014-05-02 NOTE — H&P (Signed)
[facility] History and Physical  Lori Dennis:[id number] DOB: [birth year] DOA: 05/02/2014  Referring physician: Dr. Benay Spice PCP: Irven Shelling, MD   Chief Complaint: intermittent nausea  HPI: Lori Dennis is a 68 y.o. female  With history of Metastatic carcinoid tumor with carcinomatosis prompting multiple bowel resection procedures, status post a palliative sigmoid colectomy/colostomy, right colectomy, and gastrojejunostomy in April 2009, status post placement of a palliative gastrostomy tube 07/24/2013.   Patient had recent admission on 04/08/2014 with renal failure and obstructive nephropathy. She underwent bilateral ureter stent placement and was discharged on 04/10/2014. Presented this admission secondary to elevated serum creatinine on repeat check of serum creatinine.  Subsequently we were called for admission evaluation and recommendations given acute on chronic renal failure.   Review of Systems:  Constitutional:  No weight loss, night sweats, Fevers, chills, fatigue.  HEENT:  No headaches, Difficulty swallowing,Tooth/dental problems,Sore throat,  No sneezing, itching, ear ache, nasal congestion, post nasal drip,  Cardio-vascular:  No chest pain, Orthopnea, PND, swelling in lower extremities, anasarca, dizziness, palpitations  GI:  No heartburn, indigestion, + abdominal pain, + nausea,  + vomiting, diarrhea, change in bowel habits, loss of appetite  Resp:  No shortness of breath with exertion or at rest. No excess mucus, no productive cough, No non-productive cough, No coughing up of blood.No change in color of mucus.No wheezing.No chest wall deformity  Skin:  no rash or lesions.  GU:  no dysuria, change in color of urine, no urgency or frequency. No flank pain.  Musculoskeletal:  No joint pain or swelling. No decreased range of motion. No back pain.  Psych:  No change in mood or affect. No depression or anxiety. No memory loss.   Past Medical  History  Diagnosis Date  . Complication of anesthesia     slow to wake up  . Blood transfusion without reported diagnosis 2009 and 2011  . Hypertension     off all bp meds since Dec 20, 2012  . Chronic kidney disease 12-19-2012    elevated bun and creatinine   . Cancer 02/19/2008    Metastatic carcinoid tumor   Past Surgical History  Procedure Laterality Date  . Bowel resection  06/08/2010    ileocolonic resection, enteroenterostomy  . Left colectomy  04/02/2008    Exploratory laparotomy, biopsy of omental nodule, sigmoid colectomy  . Vaginal hysterectomy      at age 68  . Cholecystectomy      age 68  . Portacath placement Right 02/06/2013    Procedure: Ultrasound guided PORT-A-CATH INSERTION with flouro;  Surgeon: [name], MD;  Location: [facility];  Service: General;  Laterality: Right;  . Esophagogastroduodenoscopy N/A 06/28/2013    Procedure: ESOPHAGOGASTRODUODENOSCOPY (EGD);  Surgeon: Winfield Cunas., MD;  Location: [facility];  Service: Endoscopy;  Laterality: N/A;  . Cystoscopy with retrograde pyelogram, ureteroscopy and stent placement Bilateral 04/08/2014    Procedure: CYSTOSCOPY WITH BILATERAL  RETROGRADE PYELOGRAM,  AND  BILATERAL  STENT PLACEMENT;  Surgeon: Irine Seal, MD;  Location: [facility];  Service: Urology;  Laterality: Bilateral;   Social History:  reports that she has never smoked. She has never used smokeless tobacco. She reports that she does not drink alcohol or use illicit drugs.  No Known Allergies  Family History  Problem Relation Age of Onset  . Heart disease Mother   . Cancer Sister     ovarian     Prior to Admission medications   Medication Sig Start Date  End Date Taking? Authorizing Provider  antiseptic oral rinse (BIOTENE) LIQD 15 mLs by Mouth Rinse route as needed (for dry mouth).    Historical Provider, MD  fluconazole (DIFLUCAN) 50 MG tablet Take 1 tablet (50 mg total) by mouth daily. X 7 days 04/23/14   Ladell Pier, MD  ondansetron  [facility]) 4 MG/2ML SOLN injection Inject 4 mg into the vein every 6 (six) hours as needed for nausea or vomiting.    Historical Provider, MD  oxyCODONE (ROXICODONE INTENSOL) 100 MG/5ML concentrated solution Take 0.3-0.5 mLs (6-10 mg total) by mouth every 4 (four) hours as needed for pain. 10/11/13   Ladell Pier, MD  promethazine (PHENERGAN) 12.5 MG suppository Place 1 suppository (12.5 mg total) rectally every 6 (six) hours as needed for nausea (if unable to tolerate po). 04/19/13   Ladell Pier, MD  promethazine (PHENERGAN) 25 MG/ML injection Inject 12.5 mg into the vein every 6 (six) hours as needed for nausea or vomiting.    Historical Provider, MD  TPN ADULT Inject 2,000 mLs into the vein continuous. 14 hours/day start between 2000-2100    Historical Provider, MD   Physical Exam: There were no vitals filed for this visit.  There were no vitals taken for this visit.  General:  Appears calm and comfortable Eyes: PERRL, normal lids, irises & conjunctiva ENT: grossly normal hearing, lips & tongue Neck: no LAD, masses or thyromegaly Cardiovascular: RRR, no m/r/g.  Respiratory: CTA bilaterally, no w/r/r. Normal respiratory effort. Abdomen: soft, hypoactive bowel sounds, stoma pink. Skin: no rash or induration seen on limited exam Musculoskeletal: grossly normal tone BUE/BLE, LLE more edematous than right. No calf tenderness. Negative homans sign Psychiatric: grossly normal mood and affect, speech fluent and appropriate Neurologic: grossly non-focal.          Labs on Admission:  Basic Metabolic Panel:  Recent Labs Lab 05/02/14 1257  NA 145  K 4.5  CO2 21*  GLUCOSE 98  BUN 69.6*  CREATININE 5.1*  CALCIUM 11.6*   Liver Function Tests:  Recent Labs Lab 05/02/14 1257  AST 17  ALT 11  ALKPHOS 191*  BILITOT 0.24  PROT 6.7  ALBUMIN 2.2*   No results found for this basename: LIPASE, AMYLASE,  in the last 168 hours No results found for this basename: AMMONIA,  in the last  168 hours CBC:  Recent Labs Lab 05/02/14 1257  WBC 6.8  NEUTROABS 5.0  HGB 8.6*  HCT 26.6*  MCV 89.0  PLT 262   Cardiac Enzymes: No results found for this basename: CKTOTAL, CKMB, CKMBINDEX, TROPONINI,  in the last 168 hours  BNP (last 3 results) No results found for this basename: PROBNP,  in the last 8760 hours CBG: No results found for this basename: GLUCAP,  in the last 168 hours  Radiological Exams on Admission: No results found.   Assessment/Plan Active Problems:   Carcinoid tumor of intestine - pt s/p multiple bowel resection procedures, status post a palliative sigmoid colectomy/colostomy, right colectomy, and gastrojejunostomy in April 2009, status post placement of a palliative gastrostomy tube 07/24/2013     HTN (hypertension) - Will hold any HTN medication in lieu of Acute kidney injury as hypoperfusion of kidney may precipitate worsening renal injury.     Nausea & vomiting - Will treat with antiemetics IV (zofran)    SBO (small bowel obstruction) - 2ary to history of carcinoid tumor. - Hold TPN at this juncture until plans delineated by specialist. Will place on MIVF's -  given that patient is npo will add IV opiods prn for pain relief.  LLE edema - repeat lower extremity doppler to r/o LLE DVT  DvT prophylaxis - Heparin   Code Status: full Family Communication: discussed with patient and daughter. Disposition Plan: Pending further recommendations from specialist involved  Time spent: > 60 minutes  [facility] Pager [phone]

## 2014-05-02 NOTE — Patient Instructions (Signed)

## 2014-05-02 NOTE — Telephone Encounter (Signed)
Per Dr Benay Spice, he would like [facility] to go out today to get stat labs on patient to follow up on CREAT from last week. Patient states she would rather come here and wait for results than to have [facility] come to home today. Appt and time verified with patient, she knows to wait to see desk nurse today before leaving.

## 2014-05-02 NOTE — Telephone Encounter (Signed)
Patient came in today for STAT labs to follow up from last week. Patient waited for results, results discussed with Dr Benay Spice, who has reviewed and will have patient admitted. We have reserved room 1311, Dr Benay Spice will have Dr Leisa Lenz admit and has spoken with her. Called and spoke with Collie Siad with Dr Jeffie Pollock, Urology, to have consult on patient upon admission for renal failure. Called and informed daughter of plan and room number. Dr Benay Spice will be seeing patient after clinic this evening.

## 2014-05-03 ENCOUNTER — Encounter (HOSPITAL_COMMUNITY): Payer: Self-pay | Admitting: Anesthesiology

## 2014-05-03 ENCOUNTER — Encounter (HOSPITAL_COMMUNITY): Payer: Medicare Other | Admitting: Certified Registered"

## 2014-05-03 ENCOUNTER — Encounter (HOSPITAL_COMMUNITY)
Admission: AD | Disposition: A | Discharge status: Home Health | Payer: Self-pay | Source: Ambulatory Visit | Attending: Internal Medicine

## 2014-05-03 ENCOUNTER — Inpatient Hospital Stay (HOSPITAL_COMMUNITY): Payer: Medicare Other

## 2014-05-03 ENCOUNTER — Other Ambulatory Visit: Payer: Self-pay

## 2014-05-03 ENCOUNTER — Inpatient Hospital Stay (HOSPITAL_COMMUNITY): Payer: Medicare Other | Admitting: Certified Registered"

## 2014-05-03 DIAGNOSIS — R609 Edema, unspecified: Secondary | ICD-10-CM

## 2014-05-03 DIAGNOSIS — C7A00 Malignant carcinoid tumor of unspecified site: Secondary | ICD-10-CM

## 2014-05-03 DIAGNOSIS — G893 Neoplasm related pain (acute) (chronic): Secondary | ICD-10-CM

## 2014-05-03 DIAGNOSIS — D649 Anemia, unspecified: Secondary | ICD-10-CM

## 2014-05-03 DIAGNOSIS — N179 Acute kidney failure, unspecified: Secondary | ICD-10-CM | POA: Diagnosis not present

## 2014-05-03 DIAGNOSIS — C7A098 Malignant carcinoid tumors of other sites: Secondary | ICD-10-CM | POA: Diagnosis not present

## 2014-05-03 DIAGNOSIS — R1900 Intra-abdominal and pelvic swelling, mass and lump, unspecified site: Secondary | ICD-10-CM

## 2014-05-03 DIAGNOSIS — K56609 Unspecified intestinal obstruction, unspecified as to partial versus complete obstruction: Secondary | ICD-10-CM | POA: Diagnosis not present

## 2014-05-03 DIAGNOSIS — N201 Calculus of ureter: Secondary | ICD-10-CM

## 2014-05-03 DIAGNOSIS — C7B8 Other secondary neuroendocrine tumors: Secondary | ICD-10-CM | POA: Diagnosis not present

## 2014-05-03 HISTORY — PX: CYSTOSCOPY WITH RETROGRADE PYELOGRAM, URETEROSCOPY AND STENT PLACEMENT: SHX5789

## 2014-05-03 LAB — BASIC METABOLIC PANEL
BUN: 64 mg/dL — AB (ref 6–23)
CALCIUM: 10.5 mg/dL (ref 8.4–10.5)
CO2: 24 mEq/L (ref 19–32)
Chloride: 102 mEq/L (ref 96–112)
Creatinine, Ser: 5.32 mg/dL — ABNORMAL HIGH (ref 0.50–1.10)
GFR, EST AFRICAN AMERICAN: 9 mL/min — AB (ref >90)
GFR, EST NON AFRICAN AMERICAN: 8 mL/min — AB (ref >90)
Glucose, Bld: 88 mg/dL (ref 70–99)
POTASSIUM: 4.6 meq/L (ref 3.7–5.3)
SODIUM: 138 meq/L (ref 137–147)

## 2014-05-03 LAB — GLUCOSE, CAPILLARY: GLUCOSE-CAPILLARY: 105 mg/dL — AB (ref 70–99)

## 2014-05-03 LAB — FERRITIN: Ferritin: 142 ng/mL (ref 10–291)

## 2014-05-03 LAB — CBC
HCT: 22.3 % — ABNORMAL LOW (ref 36.0–46.0)
HEMOGLOBIN: 7.2 g/dL — AB (ref 12.0–15.0)
MCH: 28.7 pg (ref 26.0–34.0)
MCHC: 32.3 g/dL (ref 30.0–36.0)
MCV: 88.8 fL (ref 78.0–100.0)
Platelets: 200 10*3/uL (ref 150–400)
RBC: 2.51 MIL/uL — ABNORMAL LOW (ref 3.87–5.11)
RDW: 15.3 % (ref 11.5–15.5)
WBC: 5.2 10*3/uL (ref 4.0–10.5)

## 2014-05-03 LAB — IRON AND TIBC
Iron: 41 ug/dL — ABNORMAL LOW (ref 42–135)
Saturation Ratios: 18 % — ABNORMAL LOW (ref 20–55)
TIBC: 224 ug/dL — ABNORMAL LOW (ref 250–470)
UIBC: 183 ug/dL (ref 125–400)

## 2014-05-03 LAB — SURGICAL PCR SCREEN
MRSA, PCR: NEGATIVE
Staphylococcus aureus: POSITIVE — AB

## 2014-05-03 SURGERY — CYSTOURETEROSCOPY, WITH RETROGRADE PYELOGRAM AND STENT INSERTION
Anesthesia: General | Site: Ureter | Laterality: Bilateral

## 2014-05-03 MED ORDER — FENTANYL CITRATE 0.05 MG/ML IJ SOLN
INTRAMUSCULAR | Status: AC
  Filled 2014-05-03: qty 2

## 2014-05-03 MED ORDER — FAT EMULSION 20 % IV EMUL
250.0000 mL | INTRAVENOUS | Status: AC
  Administered 2014-05-03: 250 mL via INTRAVENOUS
  Filled 2014-05-03: qty 250

## 2014-05-03 MED ORDER — MIDAZOLAM HCL 2 MG/2ML IJ SOLN
INTRAMUSCULAR | Status: AC
  Filled 2014-05-03: qty 2

## 2014-05-03 MED ORDER — IOHEXOL 300 MG/ML  SOLN
INTRAMUSCULAR | Status: DC | PRN
Start: 2014-05-03 — End: 2014-05-03
  Administered 2014-05-03: 10 mL via INTRAVENOUS

## 2014-05-03 MED ORDER — HYDROMORPHONE HCL PF 1 MG/ML IJ SOLN: 0.2500 mg | INTRAMUSCULAR | Status: DC | PRN

## 2014-05-03 MED ORDER — SODIUM CHLORIDE 0.9 % IR SOLN
Status: DC | PRN
  Administered 2014-05-03: 1000 mL
  Administered 2014-05-03: 3000 mL
  Administered 2014-05-03: 1000 mL

## 2014-05-03 MED ORDER — PROPOFOL 10 MG/ML IV BOLUS
INTRAVENOUS | Status: AC
  Filled 2014-05-03: qty 20

## 2014-05-03 MED ORDER — FENTANYL CITRATE 0.05 MG/ML IJ SOLN
INTRAMUSCULAR | Status: DC | PRN
  Administered 2014-05-03 (×2): 50 ug via INTRAVENOUS

## 2014-05-03 MED ORDER — LIDOCAINE HCL (CARDIAC) 20 MG/ML IV SOLN
INTRAVENOUS | Status: AC
  Filled 2014-05-03: qty 5

## 2014-05-03 MED ORDER — DEXTROSE-NACL 5-0.45 % IV SOLN
INTRAVENOUS | Status: DC
  Administered 2014-05-04: 04:00:00 via INTRAVENOUS

## 2014-05-03 MED ORDER — INSULIN ASPART 100 UNIT/ML ~~LOC~~ SOLN
0.0000 [IU] | Freq: Four times a day (QID) | SUBCUTANEOUS | Status: DC
  Administered 2014-05-04: 1 [IU] via SUBCUTANEOUS

## 2014-05-03 MED ORDER — ONDANSETRON HCL 4 MG/2ML IJ SOLN
INTRAMUSCULAR | Status: AC
  Filled 2014-05-03: qty 2

## 2014-05-03 MED ORDER — LACTATED RINGERS IV SOLN: INTRAVENOUS | Status: DC

## 2014-05-03 MED ORDER — DEXTROSE 5 % IV SOLN
1.0000 g | INTRAVENOUS | Status: AC
  Administered 2014-05-03: 1 g via INTRAVENOUS
  Filled 2014-05-03: qty 10

## 2014-05-03 MED ORDER — ONDANSETRON HCL 4 MG/2ML IJ SOLN
INTRAMUSCULAR | Status: DC | PRN
  Administered 2014-05-03: 4 mg via INTRAVENOUS

## 2014-05-03 MED ORDER — TRACE MINERALS CR-CU-F-FE-I-MN-MO-SE-ZN IV SOLN
INTRAVENOUS | Status: AC
  Administered 2014-05-03: 17:00:00 via INTRAVENOUS
  Filled 2014-05-03: qty 1000

## 2014-05-03 MED ORDER — PROPOFOL 10 MG/ML IV BOLUS
INTRAVENOUS | Status: DC | PRN
  Administered 2014-05-03: 160 mg via INTRAVENOUS

## 2014-05-03 MED ORDER — MIDAZOLAM HCL 5 MG/5ML IJ SOLN
INTRAMUSCULAR | Status: DC | PRN
  Administered 2014-05-03: 2 mg via INTRAVENOUS

## 2014-05-03 MED ORDER — FAT EMULSION 20 % IV EMUL: 100.0000 mL | INTRAVENOUS | Status: DC

## 2014-05-03 MED ORDER — LACTATED RINGERS IV SOLN
INTRAVENOUS | Status: DC | PRN
  Administered 2014-05-03: 12:00:00 via INTRAVENOUS

## 2014-05-03 MED ORDER — PROMETHAZINE HCL 25 MG/ML IJ SOLN: 12.5000 mg | Freq: Four times a day (QID) | INTRAMUSCULAR | Status: DC | PRN

## 2014-05-03 MED ORDER — PROMETHAZINE HCL 25 MG/ML IJ SOLN: 6.2500 mg | INTRAMUSCULAR | Status: DC | PRN

## 2014-05-03 MED ORDER — OXYCODONE HCL 20 MG/ML PO CONC
10.0000 mg | ORAL | Status: DC | PRN
  Administered 2014-05-03 – 2014-05-05 (×2): 10 mg via ORAL
  Administered 2014-05-05: 0.3 mg via ORAL
  Administered 2014-05-06: 10 mg via ORAL
  Filled 2014-05-03 (×4): qty 1

## 2014-05-03 MED ORDER — LIDOCAINE HCL (CARDIAC) 20 MG/ML IV SOLN
INTRAVENOUS | Status: DC | PRN
  Administered 2014-05-03: 50 mg via INTRAVENOUS

## 2014-05-03 MED ORDER — TRACE MINERALS CR-CU-F-FE-I-MN-MO-SE-ZN IV SOLN
INTRAVENOUS | Status: DC
  Filled 2014-05-03: qty 2000

## 2014-05-03 MED ORDER — 0.9 % SODIUM CHLORIDE (POUR BTL) OPTIME
TOPICAL | Status: DC | PRN
  Administered 2014-05-03: 1000 mL

## 2014-05-03 SURGICAL SUPPLY — 18 items
BAG URINE DRAINAGE (UROLOGICAL SUPPLIES) IMPLANT
BASKET ZERO TIP NITINOL 2.4FR (BASKET) IMPLANT
CATH INTERMIT  6FR 70CM (CATHETERS) IMPLANT
CLOTH BEACON ORANGE TIMEOUT ST (SAFETY) ×3 IMPLANT
DRAPE CAMERA CLOSED 9X96 (DRAPES) ×3 IMPLANT
FIBER LASER FLEXIVA 200 (UROLOGICAL SUPPLIES) IMPLANT
FIBER LASER FLEXIVA 365 (UROLOGICAL SUPPLIES) IMPLANT
GLOVE BIOGEL M STRL SZ7.5 (GLOVE) ×3 IMPLANT
GOWN STRL REUS W/TWL XL LVL3 (GOWN DISPOSABLE) IMPLANT
GUIDEWIRE .038 (WIRE) ×3 IMPLANT
GUIDEWIRE ANG ZIPWIRE 038X150 (WIRE) IMPLANT
GUIDEWIRE STR DUAL SENSOR (WIRE) ×3 IMPLANT
IV NS IRRIG 3000ML ARTHROMATIC (IV SOLUTION) ×3 IMPLANT
PACK CYSTO (CUSTOM PROCEDURE TRAY) ×3 IMPLANT
STENT CONTOUR 6FRX24X.038 (STENTS) IMPLANT
STENT CONTOUR 7FRX24X.038 (STENTS) ×6 IMPLANT
TRAY FOLEY CATH 16FR SILVER (SET/KITS/TRAYS/PACK) IMPLANT
TRAY FOLEY CATH 16FRSI W/METER (SET/KITS/TRAYS/PACK) ×3 IMPLANT

## 2014-05-03 NOTE — Transfer of Care (Signed)
Immediate Anesthesia Transfer of Care Note  Patient: Lori Dennis  Procedure(s) Performed: Procedure(s): CYSTOSCOPY WITH BILATERAL DOUBLE J STENT EXCHANGE, LEFT URETEROSCOPY (Bilateral)  Patient Location: PACU  Anesthesia Type:General  Level of Consciousness: awake, alert  and oriented  Airway & Oxygen Therapy: Patient Spontanous Breathing and Patient connected to face mask oxygen  Post-op Assessment: Report given to PACU RN and Post -op Vital signs reviewed and stable  Post vital signs: Reviewed and stable  Complications: No apparent anesthesia complications

## 2014-05-03 NOTE — Interval H&P Note (Signed)
History and Physical Interval Note:  05/03/2014 12:02 PM  Lori Dennis  has presented today for surgery, with the diagnosis of STENT EXCHANGE  The various methods of treatment have been discussed with the patient and family. After consideration of risks, benefits and other options for treatment, the patient has consented to  Procedure(s): CYSTOSCOPY WITH BILATERAL DOUBLE J STENT EXCHANGE, LEFT URETEROSCOPY AND POSSIBLE LASER (Left) as a surgical intervention .  The patient's history has been reviewed, patient examined, no change in status, stable for surgery.  I have reviewed the patient's chart and labs.  Questions were answered to the patient's satisfaction.     Bernestine Amass

## 2014-05-03 NOTE — Progress Notes (Signed)
Left lower extremity venous duplex completed.  Left:  No evidence of DVT, superficial thrombosis, or Baker's cyst.  Right:  Negative for DVT in the common femoral vein.  

## 2014-05-03 NOTE — Progress Notes (Signed)
[facility]  Patient Status: Active patient with [facility] up to this admission  [facility] is providing the following services: [place] for home TNA.  See formula below.  [facility] hospital team will follow while inpatient and support transition home when deemed appropriate.    If patient discharges after hours, please call [phone].   Larry Sierras 05/03/2014, 4:52 PM

## 2014-05-03 NOTE — Progress Notes (Signed)
INITIAL NUTRITION ASSESSMENT  DOCUMENTATION CODES Per approved criteria  -Not Applicable   INTERVENTION: -Recommend SLP evaluation for diet advancement-pt would likely benefit from either Dys1 or Dys2 -TPN per pharmacy -Consider addition of Carnation Instant Breakfast as diet advancement tolerated  NUTRITION DIAGNOSIS: Inadequate oral intake related to inability to eat as evidenced by NPO status.   Goal: Pt to meet >/= 90% of their estimated nutrition needs    Monitor:  Diet order/advancement, TPN tolerance, total protein/energy intake, renal profile, labs, weights  Reason for Assessment: MST/New TPN  68 y.o. female  Admitting Dx: Acute-on-chronic kidney injury  ASSESSMENT: Lori Dennis is a 68 y.o. female  With history of Metastatic carcinoid tumor with carcinomatosis prompting multiple bowel resection procedures, status post a palliative sigmoid colectomy/colostomy, right colectomy, and gastrojejunostomy in April 2009, status post placement of a palliative gastrostomy tube 07/24/2013. Patient had recent admission on 04/08/2014 with renal failure and obstructive nephropathy. She underwent bilateral ureter stent placement and was discharged on 04/10/2014. Presented this admission secondary to elevated serum creatinine on repeat check of serum creatinine  -Per pharmacy note, Home TNA provides avg/day Sun-Sat with lipids 2 days/week =1192 kcal/day, 50gm protein/day, 14 gm lipids/day  -Pt reported tolerating home TPN. Has been gradually gaining weight, lowest weight was 112 lbs approximately 10 months ago -Reported to consume soft foods- tolerates yogurt, smoothies, applesauce. Drinks supplements occasionally, usually every other day. Enjoyed taste of Carnation instant Breakfast from previous admit. Is unable to tolerate tougher textures as they block ostomy.  -Currently NPO s/p stent exchange to improve drainage of kidney per MD. Pt would benefit from SLP evaluation and/or trial  of Full liquid/Puree diet for additional kcal -Pt with acute on chronic kidney disease.Elevated BUN/Crt/Phos. Pharmacy noted possible removal of electrolytes for a few days of week if phos/Ca remain elevated -TPN to be started today with goal rate of Clinimix E5/20 at 40 ml/hr with 20% lipids MWF. This will provide an average of 1051 Kcal/day, 48 g protein/day, ~1L fluid/day Consider increase in goal rate once renal profile improves   Height: Ht Readings from Last 1 Encounters:  05/02/14 5' 3.5" (1.613 m)    Weight: Wt Readings from Last 1 Encounters:  05/02/14 132 lb 15 oz (60.3 kg)    Ideal Body Weight: 115 lbs  % Ideal Body Weight: 115%  Wt Readings from Last 10 Encounters:  05/02/14 132 lb 15 oz (60.3 kg)  05/02/14 132 lb 15 oz (60.3 kg)  04/23/14 125 lb 1.6 oz (56.745 kg)  04/10/14 129 lb 6.4 oz (58.695 kg)  04/10/14 129 lb 6.4 oz (58.695 kg)  04/10/14 129 lb 6.4 oz (58.695 kg)  03/14/14 127 lb 8 oz (57.834 kg)  01/31/14 125 lb (56.7 kg)  12/26/13 124 lb 14.4 oz (56.654 kg)  11/22/13 125 lb 1.6 oz (56.745 kg)    Usual Body Weight: 124 lbs  % Usual Body Weight: 94%  BMI:  Body mass index is 23.18 kg/(m^2).  Estimated Nutritional Needs: Kcal: 1500-1700 Protein: 60-70 gram Fluid: 1.5-1.7 L  Skin: WDL  Diet Order: NPO  EDUCATION NEEDS: -No education needs identified at this time   Intake/Output Summary (Last 24 hours) at 05/03/14 1425 Last data filed at 05/03/14 1352  Gross per 24 hour  Intake   1150 ml  Output   1650 ml  Net   -500 ml    Last BM: 5/14   Labs:   Recent Labs Lab 05/02/14 1257 05/02/14 1817 05/03/14 0530  NA 145  --  138  K 4.5  --  4.6  CL  --   --  102  CO2 21*  --  24  BUN 69.6*  --  64*  CREATININE 5.1* 5.19* 5.32*  CALCIUM 11.6*  --  10.5  MG  --  2.3  --   PHOS  --  6.3*  --   GLUCOSE 98  --  88    CBG (last 3)  No results found for this basename: GLUCAP,  in the last 72 hours  Scheduled Meds: . heparin  5,000  Units Subcutaneous 3 times per day  . insulin aspart  0-9 Units Subcutaneous 4 times per day    Continuous Infusions: . dextrose 5 % and 0.45 % NaCl with KCl 20 mEq/L 75 mL/hr at 05/03/14 0603  . dextrose 5 % and 0.45% NaCl    . fat emulsion    . Marland KitchenTPN (CLINIMIX-E) Adult      Past Medical History  Diagnosis Date  . Complication of anesthesia     slow to wake up  . Blood transfusion without reported diagnosis 2009 and 2011  . Hypertension     off all bp meds since Dec 20, 2012  . Chronic kidney disease 12-19-2012    elevated bun and creatinine   . Cancer 02/19/2008    Metastatic carcinoid tumor    Past Surgical History  Procedure Laterality Date  . Bowel resection  06/08/2010    ileocolonic resection, enteroenterostomy  . Left colectomy  04/02/2008    Exploratory laparotomy, biopsy of omental nodule, sigmoid colectomy  . Vaginal hysterectomy      at age 13  . Cholecystectomy      age 19  . Portacath placement Right 02/06/2013    Procedure: Ultrasound guided PORT-A-CATH INSERTION with flouro;  Surgeon: [name], MD;  Location: [facility];  Service: General;  Laterality: Right;  . Esophagogastroduodenoscopy N/A 06/28/2013    Procedure: ESOPHAGOGASTRODUODENOSCOPY (EGD);  Surgeon: Winfield Cunas., MD;  Location: [facility];  Service: Endoscopy;  Laterality: N/A;  . Cystoscopy with retrograde pyelogram, ureteroscopy and stent placement Bilateral 04/08/2014    Procedure: CYSTOSCOPY WITH BILATERAL  RETROGRADE PYELOGRAM,  AND  BILATERAL  STENT PLACEMENT;  Surgeon: Irine Seal, MD;  Location: [facility];  Service: Urology;  Laterality: Bilateral;    [place] LDN Clinical Dietitian [phone]

## 2014-05-03 NOTE — Op Note (Signed)
Preoperative diagnosis: Renal failure, bilateral hydronephrosis possible left ureteral calculus Postoperative diagnosis: Same  Procedure: Cystoscopy, bilateral retrogrades with fluoroscopic interpretation, bilateral double-J stent exchange, left ureteroscopy   Surgeon: Bernestine Amass M.D.  Anesthesia: Gen.  Indications: Patient has metastatic/advanced carcinoid tumor. She has bilateral ureteral obstruction. She had severe hydronephrosis and is status post double-J stent placement approximately one month ago by Dr. Jeffie Pollock. Her creatinine initially improved but is then worsened. She had a known left renal stone which may have migrated to her left ureter. She presents now for double-J stent exchange. Previously she had a 4 [place] stent on the right and a 6 [place] on the left. Ultrasound today showed continued hydronephrosis although it was improved from restent placement. She is here today to see if double-J stent exchange with larger stents will improve drainage of the kidney and hopefully her renal function. She is also to undergo assessment for possible ureteral calculus.     Technique and findings: Patient was brought the operating room where she had successful induction general anesthesia. She was placed in lithotomy position and prepped and draped in usual manner. Appropriate surgical timeout was performed. Cystoscopy revealed marked distortion of the trigone of her bladder. Both stents appeared well positioned. The right stent was partially removed and a guidewire placed up to the renal pelvis. An opening catheter was placed over the guidewire. We then perform retrograde pyelogram with fluoroscopic interpretation. There appeared to be fairly marked dilation of the ureter and mostly the renal pelvis. There was a fair amount of tortuosity. No obvious filling defects. Over the guidewire I placed a new 7 French 24 cm double-J stent.   On the left side the stent was also partially removed and a guidewire  placed up to the renal pelvis. We then engaged the distal ureter with the ureteroscope. I was able to advance the scope up proximally halfway up the ureter but then there was tortuosity and narrow voiding and I did not feel I could safely advance the ureteroscope any further. For that reason I elected to remove the ureteroscope and just replaced her stent. A new 7 French 24 cm double-J stent was placed over the wire with fluoroscopic and visual guidance. No obvious complications or problems occurred. I elected to place a Foley catheter to monitor urine output overnight.

## 2014-05-03 NOTE — Care Management Note (Signed)
CARE MANAGEMENT NOTE 05/03/2014  Patient:  TAMIRRA, SIENKIEWICZ   Account Number:  [id number]  Date Initiated:  05/03/2014  Documentation initiated by:  Ruqaya Strauss  Subjective/Objective Assessment:   68 yo female admitted with acute on chronic kidney injury. PCP: Irven Shelling, MD     Action/Plan:   Discharged when stable   Anticipated DC Date:     Anticipated DC Plan:  [place]  CM consult      Choice offered to / List presented to:  NA   DME arranged  NA      DME agency  NA     [place] arranged  NA      [place] agency  NA   Status of service:  In process, will continue to follow Medicare Important Message given?   (If response is "NO", the following Medicare IM given date fields will be blank) Date Medicare IM given:   Date Additional Medicare IM given:    Discharge Disposition:    Per UR Regulation:  Reviewed for med. necessity/level of care/duration of stay  If discussed at [place] of Stay Meetings, dates discussed:    Comments:  05/03/14 [place] Chart reviewed for utilization of services. No needs assessed at this time. Identified PCP and Pharmacy.

## 2014-05-03 NOTE — Anesthesia Postprocedure Evaluation (Signed)
  Anesthesia Post-op Note  Patient: Lori Dennis  Procedure(s) Performed: Procedure(s) (LRB): CYSTOSCOPY WITH BILATERAL DOUBLE J STENT EXCHANGE, LEFT URETEROSCOPY (Bilateral)  Patient Location: PACU  Anesthesia Type: General  Level of Consciousness: awake and alert   Airway and Oxygen Therapy: Patient Spontanous Breathing  Post-op Pain: mild  Post-op Assessment: Post-op Vital signs reviewed, Patient's Cardiovascular Status Stable, Respiratory Function Stable, Patent Airway and No signs of Nausea or vomiting  Last Vitals:  Filed Vitals:   05/03/14 1515  BP: 160/90  Pulse: 72  Temp: 36.4 C  Resp: 16    Post-op Vital Signs: stable   Complications: No apparent anesthesia complications

## 2014-05-03 NOTE — Progress Notes (Signed)
[facility]  Patient Status: Active (receiving services up to time of hospitalization)  [facility] is providing the following services: RN and TPN  If patient discharges after hours, please call [phone].   Lori Dennis 05/03/2014, 1:32 PM

## 2014-05-03 NOTE — Anesthesia Preprocedure Evaluation (Signed)
Anesthesia Evaluation  Patient identified by MRN, date of birth, ID band Patient awake    Reviewed: Allergy & Precautions, H&P , NPO status , Patient's Chart, lab work & pertinent test results  History of Anesthesia Complications (+) history of anesthetic complications  Airway Mallampati: II TM Distance: >3 FB Neck ROM: Full    Dental no notable dental hx.    Pulmonary neg pulmonary ROS,  breath sounds clear to auscultation  Pulmonary exam normal       Cardiovascular hypertension, Rhythm:Regular Rate:Normal     Neuro/Psych negative neurological ROS  negative psych ROS   GI/Hepatic negative GI ROS, Neg liver ROS,   Endo/Other  negative endocrine ROS  Renal/GU Renal InsufficiencyRenal diseaseCr 5.32 K 4.6  negative genitourinary   Musculoskeletal negative musculoskeletal ROS (+)   Abdominal   Peds negative pediatric ROS (+)  Hematology  (+) anemia ,   Anesthesia Other Findings   Reproductive/Obstetrics negative OB ROS                           Anesthesia Physical Anesthesia Plan  ASA: III  Anesthesia Plan: General   Post-op Pain Management:    Induction: Intravenous  Airway Management Planned: LMA  Additional Equipment:   Intra-op Plan:   Post-operative Plan: Extubation in OR  Informed Consent: I have reviewed the patients History and Physical, chart, labs and discussed the procedure including the risks, benefits and alternatives for the proposed anesthesia with the patient or authorized representative who has indicated his/her understanding and acceptance.   Dental advisory given  Plan Discussed with: CRNA  Anesthesia Plan Comments:         Anesthesia Quick Evaluation

## 2014-05-03 NOTE — H&P (View-Only) (Signed)
Urology Consult  Referring physician: Sherill, Gary Brad Reason for referral: Acute renal failure  History of Present Illness: [name] is 67 years of age. She's had a very complicated history over the last 5-6 years. 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 She was recently seen in consultation by Dr. Wrenn approximately 3-4 weeks ago for acute renal failure with bilateral hydronephrosis. The patient was also felt to have a possible distal left ureteral calcification/calculus. Creatinine at the time of her assessment was 5.4. The patient underwent placement of bilateral double-J stents with retrograde pyelography. She had initial improvement and appears to tolerate her stents reasonably well. It is unclear to me where she was felt to have a ureteral calculus. Her creatinine did improve to approximately 3.5. Last week her creatinine was 3.0 but her daughter states it is improved further from that. Again it is now gone back up to 5.1. She is not having any definitive flank discomfort. She has had some increased fatigue. She is actually been able to do incredibly well despite this daunting battle with her cancer. She continues on TPN. She takes nothing per mouth. She has a gastric tube. She was to get a renal ultrasound which has not yet been performed.  Past Medical History  Diagnosis Date  . Complication of anesthesia     slow to wake up  . Blood transfusion without reported diagnosis 2009 and 2011  . Hypertension     off all bp meds since Dec 20, 2012  . Chronic kidney disease 12-19-2012    elevated bun and creatinine   . Cancer 02/19/2008    Metastatic carcinoid tumor   Past Surgical History  Procedure Laterality Date  . Bowel resection  06/08/2010    ileocolonic resection, enteroenterostomy  . Left colectomy  04/02/2008    Exploratory laparotomy, biopsy of omental nodule, sigmoid colectomy  . Vaginal hysterectomy      at age 42  . Cholecystectomy      age 30  . Portacath  placement Right 02/06/2013    Procedure: Ultrasound guided PORT-A-CATH INSERTION with flouro;  Surgeon: Todd J Rosenbower, MD;  Location: [facility];  Service: General;  Laterality: Right;  . Esophagogastroduodenoscopy N/A 06/28/2013    Procedure: ESOPHAGOGASTRODUODENOSCOPY (EGD);  Surgeon: James L Edwards Jr., MD;  Location: [facility];  Service: Endoscopy;  Laterality: N/A;  . Cystoscopy with retrograde pyelogram, ureteroscopy and stent placement Bilateral 04/08/2014    Procedure: CYSTOSCOPY WITH BILATERAL  RETROGRADE PYELOGRAM,  AND  BILATERAL  STENT PLACEMENT;  Surgeon: John Wrenn, MD;  Location: [facility];  Service: Urology;  Laterality: Bilateral;    Medications:  Scheduled: . heparin  5,000 Units Subcutaneous 3 times per day    Allergies: No Known Allergies  Family History  Problem Relation Age of Onset  . Heart disease Mother   . Cancer Sister     ovarian    Social History:  reports that she has never smoked. She has never used smokeless tobacco. She reports that she does not drink alcohol or use illicit drugs.  ROS primarily pertinent positives and negatives in history of present illness. She does have left lower extremity edema. She apparently has been rolled up for DVT in the past. She is scheduled for repeat Doppler She is having no shortness of breath chest pain significant flank discomfort. She has had no gross hematuria.  Physical Exam:  Vital signs in last 24 hours:    Constitutional: Vital signs reviewed. WD WN in NAD Head: Normocephalic   and atraumatic   Eyes: PERRL, No scleral icterus.  Neck: Supple No  Gross JVD, mass, thyromegaly, or carotid bruit present.  Cardiovascular: RRR Pulmonary/Chest: Normal effort Abdominal: Soft. G-tube present Genitourinary: Not examined Extremities: Significant lower extremity asymmetry with left lower extremity lymphedema  Neurological: Grossly non-focal.  Skin: Warm,very dry and intact. No rash, cyanosis   Laboratory Data:   Results for orders placed in visit on 05/02/14 (from the past 72 hour(s))  CBC WITH DIFFERENTIAL     Status: Abnormal   Collection Time    05/02/14 12:57 PM      Result Value Ref Range   WBC 6.8  3.9 - 10.3 10e3/uL   NEUT# 5.0  1.5 - 6.5 10e3/uL   HGB 8.6 (*) 11.6 - 15.9 g/dL   HCT 26.6 (*) 34.8 - 46.6 %   Platelets 262  145 - 400 10e3/uL   MCV 89.0  79.5 - 101.0 fL   MCH 28.8  25.1 - 34.0 pg   MCHC 32.3  31.5 - 36.0 g/dL   RBC 2.99 (*) 3.70 - 5.45 10e6/uL   RDW 15.1 (*) 11.2 - 14.5 %   lymph# 1.3  0.9 - 3.3 10e3/uL   MONO# 0.4  0.1 - 0.9 10e3/uL   Eosinophils Absolute 0.2  0.0 - 0.5 10e3/uL   Basophils Absolute 0.0  0.0 - 0.1 10e3/uL   NEUT% 73.5  38.4 - 76.8 %   LYMPH% 18.6  14.0 - 49.7 %   MONO% 5.3  0.0 - 14.0 %   EOS% 2.2  0.0 - 7.0 %   BASO% 0.4  0.0 - 2.0 %  COMPREHENSIVE METABOLIC PANEL (CC13)     Status: Abnormal   Collection Time    05/02/14 12:57 PM      Result Value Ref Range   Sodium 145  136 - 145 mEq/L   Potassium 4.5  3.5 - 5.1 mEq/L   Chloride 111 (*) 98 - 109 mEq/L   CO2 21 (*) 22 - 29 mEq/L   Glucose 98  70 - 140 mg/dl   BUN 69.6 (*) 7.0 - 26.0 mg/dL   Creatinine 5.1 (*) 0.6 - 1.1 mg/dL   Total Bilirubin 0.24  0.20 - 1.20 mg/dL   Alkaline Phosphatase 191 (*) 40 - 150 U/L   AST 17  5 - 34 U/L   ALT 11  0 - 55 U/L   Total Protein 6.7  6.4 - 8.3 g/dL   Albumin 2.2 (*) 3.5 - 5.0 g/dL   Calcium 11.6 (*) 8.4 - 10.4 mg/dL   Anion Gap 14 (*) 3 - 11 mEq/L   No results found for this or any previous visit (from the past 240 hour(s)). Creatinine:  Recent Labs  05/02/14 1257  CREATININE 5.1*   Baseline Creatinine:   Impression/Assessment:  History of bilateral ureteral obstruction secondary to carcinoid tumor the patient had double-J stent was placed approximately a month ago. Her creatinine has now worsened. I suspect recurrent obstruction with poor drainage from her double-J stent. A renal ultrasound should be performed to check on the status of  hydronephrosis before embarking on double-J stent exchange. Patients with diffuse intra-abdominal malignancies often do not drained her kidneys well with double-J stent and at times percutaneous nephrostomy tubes are necessary. Given her situation is certainly reasonable to try to replace her stents with larger caliber 7 French stents bilaterally. She should also be assessed for the possibility of a concurrent ureteral stone. I discussed this with the patient and   her daughter. Dr. Jeffie Pollock will not be available tomorrow. Prior to her surgery she should have a renal ultrasound and also assessment of that left lower extremity for possible DVT. She will most likely require surgery tomorrow. This may need to be done by the on-call physician who I will discuss the case with.  Plan: As above   Lori Dennis 05/02/2014, 5:46 PM

## 2014-05-03 NOTE — Progress Notes (Signed)
Subjective: Fatigued, some nausea and gagging  Objective: Vital signs in last 24 hours: Temp:  [98.1 F (36.7 C)-98.9 F (37.2 C)] 98.7 F (37.1 C) (05/15 0554) Pulse Rate:  [75-79] 75 (05/15 0554) Resp:  [16-18] 16 (05/15 0554) BP: (142-179)/(76-84) 142/82 mmHg (05/15 0604) SpO2:  [100 %] 100 % (05/15 0554) Weight:  [60.3 kg (132 lb 15 oz)] 60.3 kg (132 lb 15 oz) (05/14 1751) Weight change:  Last BM Date: 05/02/14  Intake/Output from previous day: 05/14 0701 - 05/15 0700 In: 500 [P.O.:500] Out: 800 [Stool:800] Intake/Output this shift:    General appearance: alert and cooperative Resp: clear to auscultation bilaterally Cardio: regular rate and rhythm, S1, S2 normal, no murmur, click, rub or gallop GI: soft, mild tenderness, +BS Extremities: 1+ edema L leg  Lab Results:  Recent Labs  05/02/14 1817 05/03/14 0530  WBC 6.0 5.2  HGB 7.8* 7.2*  HCT 24.1* 22.3*  PLT 230 200   BMET  Recent Labs  05/02/14 1257 05/02/14 1817 05/03/14 0530  NA 145  --  138  K 4.5  --  4.6  CL  --   --  102  CO2 21*  --  24  GLUCOSE 98  --  88  BUN 69.6*  --  64*  CREATININE 5.1* 5.19* 5.32*  CALCIUM 11.6*  --  10.5    Studies/Results: No results found.  Medications: I have reviewed the patient's current medications.  Assessment/Plan:  Principal Problem:   Acute-on-chronic kidney injury  Stent replacement today by urology. IVFs Active Problems:   Carcinoid tumor of intestine pain control, prognosis guarded.   HTN (hypertension) follow   Nausea & vomiting symptomatic treatment   L leg swelling, venous doppler US today  R/O DVT   Anemia chronic disease and renal disease, does report increased fatigue, consider transfusion over weekend.  Check iron studies   FEN, start TPN   LOS: 1 day   Irven Shelling 05/03/2014, 7:30 AM

## 2014-05-03 NOTE — Progress Notes (Signed)
PARENTERAL NUTRITION CONSULT NOTE - INITIAL  Pharmacy Consult for TNA  Indication: intolerance to enteral feedings  No Known Allergies  Patient Measurements: Height: 5' 3.5" (161.3 cm) Weight: 132 lb 15 oz (60.3 kg) IBW/kg (Calculated) : 53.55   Vital Signs: Temp: 98.7 F (37.1 C) (05/15 0554) Temp src: Oral (05/15 0554) BP: 142/82 mmHg (05/15 0604) Pulse Rate: 75 (05/15 0554) Intake/Output from previous day: 05/14 0701 - 05/15 0700 In: 500 [P.O.:500] Out: 800 [Stool:800] Intake/Output from this shift:    Labs:  Recent Labs  05/02/14 1257 05/02/14 1817 05/03/14 0530  WBC 6.8 6.0 5.2  HGB 8.6* 7.8* 7.2*  HCT 26.6* 24.1* 22.3*  PLT 262 230 200     Recent Labs  05/02/14 1257 05/02/14 1817 05/03/14 0530  NA 145  --  138  K 4.5  --  4.6  CL  --   --  102  CO2 21*  --  24  GLUCOSE 98  --  88  BUN 69.6*  --  64*  CREATININE 5.1* 5.19* 5.32*  CALCIUM 11.6*  --  10.5  MG  --  2.3  --   PHOS  --  6.3*  --   PROT 6.7  --   --   ALBUMIN 2.2*  --   --   AST 17  --   --   ALT 11  --   --   ALKPHOS 191*  --   --   BILITOT 0.24  --   --    Estimated Creatinine Clearance: 8.7 ml/min (by C-G formula based on Cr of 5.32).   No results found for this basename: GLUCAP,  in the last 72 hours  Medical History: Past Medical History  Diagnosis Date  . Complication of anesthesia     slow to wake up  . Blood transfusion without reported diagnosis 2009 and 2011  . Hypertension     off all bp meds since Dec 20, 2012  . Chronic kidney disease 12-19-2012    elevated bun and creatinine   . Cancer 02/19/2008    Metastatic carcinoid tumor    Medications:  Scheduled:  . cefTRIAXone (ROCEPHIN)  IV  1 g Intravenous 30 min Pre-Op  . heparin  5,000 Units Subcutaneous 3 times per day   Infusions:  . dextrose 5 % and 0.45 % NaCl with KCl 20 mEq/L 75 mL/hr at 05/03/14 0603    Insulin Requirements in the past 24 hours:  0 units, sensitive SSI to start today with  TNA  Current Nutrition:  NPO  IVF:  D5W 1/2NS w 20 mEq KCl @ 46m/hr  Assessment: 89yoF admitted 5/14 with elevated SCr. Patient had recent admission on 04/08/2014 with renal failure and obstructive nephropathy. S/p cystoscopy with BL stent placement 4/20. Pt to have renal stents replaced this admission. Pt with PMH of HTN, CKD, metastatic carcinoid tumor s/p chemo, h/o SBO s/p small bowel, colon resection colostomy, gastrojejunostomy (2009) on home TPN.  Pharmacy has been consulted to continue TNA while inpatient. Patient did not come in with home TNA bag. [facility] and T[facility] will be started on 5/15  Labs:  Electrolytes: K 4.6, CCa elevated to 11.94, Phos high at 6.3, Mag nml at 2.3. Ca x Phos = 75. Will resume TNA today with electrolytes, but may need to remove lytes a few days/week if Ca and Phos remain high. Hopeful electrolytes and renal function will return to WNL once renal stents replaced Renal Function:  AoCKD - SCr 5.32. Will resume TNA with ~0.8g/kg/day protein/day and assess need to adjust protein daily.  Hepatic Function: Alk Phos elevated to 191, AST/ALT WNL  Pre-Albumin: Ordered, 20.6 (4/21) Triglycerides: ordered, 58 (4/21)  CBGs: no CBG yet, serum BG ok   Home TNA provided by [facility]:  2 days/week: 1550 kcal/d, 50 gm protein/d, 50 gm 30% fat emulsion  5 days/week: 1049 kcal/d, 50 gm protein/d, no fats  TNA delivered as a cyclic administration: 9.2E/F delivered over 16 hours including 1 hour ramp up and 1 hour ramp down  Nutritional Goals:  RD recommendations pending  Home TNA provides avg/day Sun-Sat with lipids 2 days/week =1192 kcal/day, 50gm protein/day, 14 gm lipids/day  Clinimix E5/20 at 40 ml/hr with 20% lipids MWF will provide an average of 1051 Kcal/day, 48 g protein/day, ~1L fluid/day  TNA day#: continued from home  TNA access: [facility]   Plan:  At 1800 today:  Start E5/20 Clinimix at 37m/hr with 20% lipids at 10 ml/hr MWF Plan to advance as  tolerated to goal rate to match RD recommendations when available.  TNA to contain standard multivitamins and trace elements daily Famotidine 211mIV to be added to TNA daily as per home TNA regimen with reduced dose due to reduced renal function Reduce IVF to 3566mr and remove KCl from fluid TNA lab panel in AM TNA lab panels on Mondays & Thursdays  Sensitive scale SSI with CBG checks q6h  Follow-up RD recommendations  Follow-up daily  Thank you for the consult.  JesJohny DrillingharmD, BCPS Pager: [phone]: [phone]/2015 11:28 AM

## 2014-05-03 NOTE — Progress Notes (Signed)
IP PROGRESS NOTE  Subjective:   She was readmitted yesterday with acute renal failure. The creatinine was higher on 04/26/2014 and began yesterday. She complains of abdominal pain this morning. She reports malaise. No gross hematuria. The left leg remains swollen, no pain.  Objective: Vital signs in last 24 hours: Blood pressure 142/82, pulse 75, temperature 98.7 F (37.1 C), temperature source Oral, resp. rate 16, height 5' 3.5" (1.613 m), weight 132 lb 15 oz (60.3 kg), SpO2 100.00%.  Intake/Output from previous day: 05/14 0701 - 05/15 0700 In: 500 [P.O.:500] Out: 800 [Stool:800]  Physical Exam:  HEENT: The mouth is dry Lungs: Clear bilaterally Cardiac: Regular rate and rhythm Abdomen: Small amount of liquid in the colostomy bag, left upper quadrant gastrostomy tube, firmness in the right abdomen Extremities: Edema throughout the left leg   Portacath/PICC-without erythema  Lab Results:  Recent Labs  05/02/14 1817 05/03/14 0530  WBC 6.0 5.2  HGB 7.8* 7.2*  HCT 24.1* 22.3*  PLT 230 200    BMET  Recent Labs  05/02/14 1257 05/02/14 1817 05/03/14 0530  NA 145  --  138  K 4.5  --  4.6  CL  --   --  102  CO2 21*  --  24  GLUCOSE 98  --  88  BUN 69.6*  --  64*  CREATININE 5.1* 5.19* 5.32*  CALCIUM 11.6*  --  10.5    Studies/Results: US Renal  05/03/2014   CLINICAL DATA:  Acute on chronic renal failure. History of bilateral ureteral stents.  EXAM: RENAL/URINARY TRACT ULTRASOUND COMPLETE  COMPARISON:  CT, 04/08/2014  FINDINGS: Right Kidney:  Length: 12.7 cm. Moderate hydronephrosis. Stent curls within the renal pelvis. Normal parenchymal echogenicity. No mass or stone.  Left Kidney:  Length: 12.3 cm. Mild to moderate hydronephrosis. Stent curls in the renal pelvis. No mass or stone.  Bladder:  Decompressed.  Distal aspects of both stents were visualized.  IMPRESSION: 1. Moderate right and mild to moderate left hydronephrosis despite the presence of bilateral ureteral  stents. Severity of the hydronephrosis, however, has improved, more notably on the left. 2. No other abnormalities.   Electronically Signed   By: Lajean Manes M.D.   On: 05/03/2014 09:57    Medications: I have reviewed the patient's current medications.  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 Acute renal failure secondary to an obstructing pelvic mass and a left ureter stone, status post placement of bilateral ureter stents 04/08/2014  Recurrent acute renal failure 05/02/2014  3. history of Gross hematuria-likely secondary to the left ureter stone  4. Chronic left leg edema-negative Doppler 11/03/2012 , increased leg swelling on this hospital admission-Doppler pending 5. Nutrition-maintained on home TNA  6. Right scalp herpes zoster rash July 2013  7. Pain secondary to progressive carcinomatosis and urinary obstruction-she continues oxycodone as needed  8. anemia secondary to chronic disease, or GU blood loss, and renal failure  Ms. Bennetts has developed recurrent renal failure. The renal failure may be related to obstruction of the ureter stents, though the hydronephrosis appears improved.  Recommendations: 1. Urology consult to consider stent exchange versus percutaneous nephrostomy tubes 2. Nephrology consult if the renal failure does not improve following stent exchange 3. I agree with a red cell transfusion, consider outpatient erythropoietin therapy if the anemia persists 4. Resume TNA when  the renal failure improves 5. Please call oncology as needed over the weekend. I will check on her 05/06/2014 or as an outpatient      LOS: 1 day   Ladell Pier  05/03/2014, 11:19 AM

## 2014-05-04 LAB — GLUCOSE, CAPILLARY
GLUCOSE-CAPILLARY: 112 mg/dL — AB (ref 70–99)
GLUCOSE-CAPILLARY: 126 mg/dL — AB (ref 70–99)
GLUCOSE-CAPILLARY: 132 mg/dL — AB (ref 70–99)
GLUCOSE-CAPILLARY: 99 mg/dL (ref 70–99)
Glucose-Capillary: 105 mg/dL — ABNORMAL HIGH (ref 70–99)

## 2014-05-04 LAB — URINE CULTURE
COLONY COUNT: NO GROWTH
Culture: NO GROWTH

## 2014-05-04 LAB — DIFFERENTIAL
BASOS PCT: 0 % (ref 0–1)
Basophils Absolute: 0 10*3/uL (ref 0.0–0.1)
EOS PCT: 2 % (ref 0–5)
Eosinophils Absolute: 0.1 10*3/uL (ref 0.0–0.7)
Lymphocytes Relative: 22 % (ref 12–46)
Lymphs Abs: 1.1 10*3/uL (ref 0.7–4.0)
MONO ABS: 0.3 10*3/uL (ref 0.1–1.0)
Monocytes Relative: 5 % (ref 3–12)
NEUTROS ABS: 3.4 10*3/uL (ref 1.7–7.7)
Neutrophils Relative %: 70 % (ref 43–77)

## 2014-05-04 LAB — MAGNESIUM: Magnesium: 2.1 mg/dL (ref 1.5–2.5)

## 2014-05-04 LAB — CBC
HEMATOCRIT: 27.4 % — AB (ref 36.0–46.0)
Hemoglobin: 8.9 g/dL — ABNORMAL LOW (ref 12.0–15.0)
MCH: 29.1 pg (ref 26.0–34.0)
MCHC: 32.5 g/dL (ref 30.0–36.0)
MCV: 89.5 fL (ref 78.0–100.0)
PLATELETS: 173 10*3/uL (ref 150–400)
RBC: 3.06 MIL/uL — ABNORMAL LOW (ref 3.87–5.11)
RDW: 15.4 % (ref 11.5–15.5)
WBC: 4.9 10*3/uL (ref 4.0–10.5)

## 2014-05-04 LAB — COMPREHENSIVE METABOLIC PANEL
ALBUMIN: 2 g/dL — AB (ref 3.5–5.2)
ALT: 11 U/L (ref 0–35)
AST: 20 U/L (ref 0–37)
Alkaline Phosphatase: 187 U/L — ABNORMAL HIGH (ref 39–117)
BUN: 59 mg/dL — AB (ref 6–23)
CALCIUM: 9.8 mg/dL (ref 8.4–10.5)
CO2: 26 mEq/L (ref 19–32)
CREATININE: 5.27 mg/dL — AB (ref 0.50–1.10)
Chloride: 101 mEq/L (ref 96–112)
GFR calc non Af Amer: 8 mL/min — ABNORMAL LOW (ref >90)
GFR, EST AFRICAN AMERICAN: 9 mL/min — AB (ref >90)
Glucose, Bld: 186 mg/dL — ABNORMAL HIGH (ref 70–99)
Potassium: 4.6 mEq/L (ref 3.7–5.3)
Sodium: 139 mEq/L (ref 137–147)
Total Bilirubin: <0.2 mg/dL — ABNORMAL LOW (ref 0.3–1.2)
Total Protein: 6 g/dL (ref 6.0–8.3)

## 2014-05-04 LAB — PREALBUMIN: PREALBUMIN: 22.7 mg/dL (ref 17.0–34.0)

## 2014-05-04 LAB — TRIGLYCERIDES: Triglycerides: 158 mg/dL — ABNORMAL HIGH (ref <150)

## 2014-05-04 LAB — PHOSPHORUS: Phosphorus: 6.3 mg/dL — ABNORMAL HIGH (ref 2.3–4.6)

## 2014-05-04 MED ORDER — ALTEPLASE 2 MG IJ SOLR
2.0000 mg | Freq: Once | INTRAMUSCULAR | Status: AC
  Administered 2014-05-04: 2 mg
  Filled 2014-05-04: qty 2

## 2014-05-04 MED ORDER — FAT EMULSION 20 % IV EMUL
250.0000 mL | INTRAVENOUS | Status: AC
  Administered 2014-05-04: 250 mL via INTRAVENOUS
  Filled 2014-05-04: qty 250

## 2014-05-04 MED ORDER — STERILE WATER FOR INJECTION IJ SOLN
INTRAMUSCULAR | Status: AC
  Filled 2014-05-04: qty 10

## 2014-05-04 MED ORDER — M.V.I. ADULT IV INJ
INTRAVENOUS | Status: AC
  Administered 2014-05-04: 18:00:00 via INTRAVENOUS
  Filled 2014-05-04: qty 1000

## 2014-05-04 NOTE — Progress Notes (Signed)
TNA and lipids scanned to be hung prior to hanging.  Dtr stated that for the first time, the [facility] was slow to give back blood.  Sluggish blood return with difficulty flushing after.  PAC reaccessed per protocol by Benito Mccreedy RN.  Continues to have difficulty flushing and drawing blood from line.   TPA ordered and instilled to dwell.  Night shift VAST team RN aware of the above and need to start TNA when TPA removed.

## 2014-05-04 NOTE — Progress Notes (Signed)
1 Day Post-Op  Subjective:  1 - Bilateral Malignant Hydronephrosis - bilateral mod-severe hydro by imaging x several 2/2 peritoneal carcinomatosis / metastatic carcinoid. Cysto bilateral stent exchange for 7x24 's 5/15 by [place].   2 - Acute on Chronic Renal Insufficiency - Cr 5-6 past month up from baseline <1.5 last year. K normal.   Today Lori Dennis is feeling much stronger. Copious UOP past 24 hours, now voided well w/o foley.   Objective: Vital signs in last 24 hours: Temp:  [97.3 F (36.3 C)-98.4 F (36.9 C)] 98.3 F (36.8 C) (05/16 0441) Pulse Rate:  [72-76] 75 (05/16 0441) Resp:  [16-25] 16 (05/16 0441) BP: (130-179)/(74-90) 138/90 mmHg (05/16 0441) SpO2:  [98 %-100 %] 98 % (05/16 0441) Last BM Date: 05/03/14  Intake/Output from previous day: 05/15 0701 - 05/16 0700 In: 950 [P.O.:300; I.V.:650] Out: 4098 [Urine:3100; Drains:100; Stool:950] Intake/Output this shift: Total I/O In: 240 [P.O.:240] Out: 450 [Urine:450]  General appearance: alert, cooperative and daughter at bedside Nose: Nares normal. Septum midline. Mucosa normal. No drainage or sinus tenderness. Throat: lips, mucosa, and tongue normal; teeth and gums normal Neck: supple, symmetrical, trachea midline Back: symmetric, no curvature. ROM normal. No CVA tenderness. Resp: no labored breathing Cardio: Nl rate GI: G tube with bilious output. Mulitple  abd scars. Pelvic: external genitalia normal and foley now out Extremities: extremities normal, atraumatic, no cyanosis or edema Pulses: 2+ and symmetric Skin: Skin color, texture, turgor normal. No rashes or lesions Lymph nodes: Cervical, supraclavicular, and axillary nodes normal. Neurologic: Grossly normal  Lab Results:   Recent Labs  05/03/14 0530 05/04/14 0430  WBC 5.2 4.9  HGB 7.2* 8.9*  HCT 22.3* 27.4*  PLT 200 173   BMET  Recent Labs  05/03/14 0530 05/04/14 0430  NA 138 139  K 4.6 4.6  CL 102 101  CO2 24 26  GLUCOSE 88 186*  BUN 64* 59*   CREATININE 5.32* 5.27*  CALCIUM 10.5 9.8   PT/INR No results found for this basename: LABPROT, INR,  in the last 72 hours ABG No results found for this basename: PHART, PCO2, PO2, HCO3,  in the last 72 hours  Studies/Results: US Renal  05/03/2014   CLINICAL DATA:  Acute on chronic renal failure. History of bilateral ureteral stents.  EXAM: RENAL/URINARY TRACT ULTRASOUND COMPLETE  COMPARISON:  CT, 04/08/2014  FINDINGS: Right Kidney:  Length: 12.7 cm. Moderate hydronephrosis. Stent curls within the renal pelvis. Normal parenchymal echogenicity. No mass or stone.  Left Kidney:  Length: 12.3 cm. Mild to moderate hydronephrosis. Stent curls in the renal pelvis. No mass or stone.  Bladder:  Decompressed.  Distal aspects of both stents were visualized.  IMPRESSION: 1. Moderate right and mild to moderate left hydronephrosis despite the presence of bilateral ureteral stents. Severity of the hydronephrosis, however, has improved, more notably on the left. 2. No other abnormalities.   Electronically Signed   By: Lajean Manes M.D.   On: 05/03/2014 09:57    Anti-infectives: Anti-infectives   Start     Dose/Rate Route Frequency Ordered Stop   05/03/14 0639  cefTRIAXone (ROCEPHIN) 1 g in dextrose 5 % 50 mL IVPB     1 g 100 mL/hr over 30 Minutes Intravenous 30 min pre-op 05/03/14 1191 05/03/14 1233      Assessment/Plan:  1 - Bilateral Malignant Hydronephrosis - reinforced again to pt and family options of management including non-treatmenn / palliative, stents (as per current) or neph tubes. They want to continue stents, this is  certainly reasonable.   2 - Acute on Chronic Renal Insufficiency - Cr stable. UOP and K normal. Reinforced to pt and family that Cr may very well not normalize, but that as long as UOP and K acceptable, this is reasonable.   Will follow, call with questions, pt likely OK for DC from Urol perspective at any point.   [name] 05/04/2014

## 2014-05-04 NOTE — Progress Notes (Signed)
PARENTERAL NUTRITION CONSULT NOTE - Follow Up  Pharmacy Consult for TNA  Indication: intolerance to enteral feedings  No Known Allergies  Patient Measurements: Height: 5' 3.5" (161.3 cm) Weight: 132 lb 15 oz (60.3 kg) IBW/kg (Calculated) : 53.55  Vital Signs: Temp: 98.3 F (36.8 C) (05/16 0441) Temp src: Oral (05/16 0441) BP: 138/90 mmHg (05/16 0441) Pulse Rate: 75 (05/16 0441) Intake/Output from previous day: 05/15 0701 - 05/16 0700 In: 950 [P.O.:300; I.V.:650] Out: 4150 [Urine:3100; Drains:100; Stool:950] Intake/Output from this shift: Total I/O In: 240 [P.O.:240] Out: 450 [Urine:450]  Labs:  Recent Labs  05/02/14 1817 05/03/14 0530 05/04/14 0430  WBC 6.0 5.2 4.9  HGB 7.8* 7.2* 8.9*  HCT 24.1* 22.3* 27.4*  PLT 230 200 173    Recent Labs  05/02/14 1257 05/02/14 1817 05/03/14 0530 05/04/14 0430  NA 145  --  138 139  K 4.5  --  4.6 4.6  CL  --   --  102 101  CO2 21*  --  24 26  GLUCOSE 98  --  88 186*  BUN 69.6*  --  64* 59*  CREATININE 5.1* 5.19* 5.32* 5.27*  CALCIUM 11.6*  --  10.5 9.8  MG  --  2.3  --  2.1  PHOS  --  6.3*  --  6.3*  PROT 6.7  --   --  6.0  ALBUMIN 2.2*  --   --  2.0*  AST 17  --   --  20  ALT 11  --   --  11  ALKPHOS 191*  --   --  187*  BILITOT 0.24  --   --  <0.2*  TRIG  --   --   --  158*   Estimated Creatinine Clearance: 8.8 ml/min (by C-G formula based on Cr of 5.27).   Recent Labs  05/03/14 1830 05/04/14 0004 05/04/14 0614  GLUCAP 105* 132* 105*   Medications:  Scheduled:  . heparin  5,000 Units Subcutaneous 3 times per day  . insulin aspart  0-9 Units Subcutaneous 4 times per day   Infusions:  . dextrose 5 % and 0.45% NaCl 35 mL/hr at 05/04/14 0423  . fat emulsion 250 mL (05/03/14 1704)  . Marland KitchenTPN (CLINIMIX-E) Adult 40 mL/hr at 05/03/14 1704   Insulin Requirements in the past 24 hours:  0 units, sensitive SSI to start today with TNA  Current Nutrition:  Regular diet from 5/15  IVF:  D5W 1/2NS @  22m/hr  Assessment: 89yoF admitted 5/14 with elevated SCr. Patient had recent admission on 04/08/2014 with renal failure and obstructive nephropathy. S/p cystoscopy with BL stent placement 4/20. Pt to have renal stents replaced this admission. Pt with PMH of HTN, CKD, metastatic carcinoid tumor s/p chemo, h/o SBO s/p small bowel, colon resection colostomy, gastrojejunostomy (2009) on home TPN.  Pharmacy has been consulted to continue TNA while inpatient. Patient did not come in with home TNA bag. [facility] will be started on 5/15  Labs:  Electrolytes: K 4.6, CCa elevated to 11, Phos high at 6.3, Mag nml at 2.1. Ca x Phos = 69.3  Renal Function: AoCKD - SCr 5.32. Will resume TNA with ~0.8g/kg/day protein/day and assess need to adjust protein daily.  Hepatic Function: Alk Phos elevated to 187, AST/ALT WNL  Pre-Albumin: pending (5/16) Triglycerides: 158 (5/16)  CBGs: 105   [facility]  2 days/week: 1550 kcal/d, 50 gm protein/d, 50 gm 30% fat  emulsion  5 days/week: 1049 kcal/d, 50 gm protein/d, no fats  TNA delivered as a cyclic administration: 6.3F/H delivered over 16 hours including 1 hour ramp up and 1 hour ramp down  Nutritional Goals:  RD recommendations: Protein 60-70 gm/day, Kcal 1500-1700/day  Home TNA provides avg/day Sun-Sat with lipids 2 days/week =1192 kcal/day, 50gm protein/day, 14 gm lipids/day  Clinimix E5/20 at 40 ml/hr with 20% lipids MWF will provide an average of 1051 Kcal/day, 48 g protein/day, ~1L fluid/day Clinimix 5/15-no electrolytes at 60 ml/hr with 20% lipids MWF will provide 72 gm protein, 1230 Kcal/day  TNA day#: continued from home  [facility]NA access: [facility]  Plan:  At 1800 today:  Change to  E5/15 Clinimix-no electrolytes at 18m/hr with 20% lipids at 10 ml/hr MWF (will deliver 48gm protein/900 Kcal/day) Plan to advance as tolerated to a goal rate to match RD recommendations when available.  TNA to contain standard multivitamins  daily and trace elements MWF Famotidine 266mIV to be added to TNA daily as per home TNA regimen with reduced dose due to reduced renal function Reduce IVF to 3551mr and remove KCl from fluid-may need to add back to IV fluid with no E-lyte formula TNA lab panels on Mondays & Thursdays  Sensitive scale SSI with CBG checks q6h  Bmet, Magnesium, Phosphorus levels in am.  Thank you for the consult.  GreMinda DittoarmD Pager [phone], 10:48 AM

## 2014-05-04 NOTE — Progress Notes (Signed)
Subjective: Patient sitting in chair, no complaints of nausea vomiting. No abdominal pain. Labs show improvement in hemoglobin, creatinine remains at 5. Cystoscopy procedure noted. Case discussed with daughters in detail.  Objective: Vital signs in last 24 hours: Temp:  [97.3 F (36.3 C)-98.4 F (36.9 C)] 98.3 F (36.8 C) (05/16 0441) Pulse Rate:  [72-76] 75 (05/16 0441) Resp:  [16-25] 16 (05/16 0441) BP: (130-179)/(74-90) 138/90 mmHg (05/16 0441) SpO2:  [98 %-100 %] 98 % (05/16 0441) Weight change:  Last BM Date: 05/03/14  Intake/Output from previous day: 05/15 0701 - 05/16 0700 In: 950 [P.O.:300; I.V.:650] Out: 69 [Urine:3100; Drains:100; Stool:950] Intake/Output this shift: Total I/O In: -  Out: 250 [Urine:250]  General appearance: alert and cooperative Resp: clear to auscultation bilaterally Cardio: regular rate and rhythm, S1, S2 normal, no murmur, click, rub or gallop GI: Abdomen soft, ostomy in place Extremities: extremities normal, atraumatic, no cyanosis or edema  Lab Results:  Results for orders placed during the hospital encounter of 05/02/14 (from the past 24 hour(s))  SURGICAL PCR SCREEN     Status: Abnormal   Collection Time    05/03/14 10:25 AM      Result Value Ref Range   MRSA, PCR NEGATIVE  NEGATIVE   Staphylococcus aureus POSITIVE (*) NEGATIVE  IRON AND TIBC     Status: Abnormal   Collection Time    05/03/14 11:00 AM      Result Value Ref Range   Iron 41 (*) 42 - 135 ug/dL   TIBC 224 (*) 250 - 470 ug/dL   Saturation Ratios 18 (*) 20 - 55 %   UIBC 183  125 - 400 ug/dL  FERRITIN     Status: None   Collection Time    05/03/14 11:00 AM      Result Value Ref Range   Ferritin 142  10 - 291 ng/mL  GLUCOSE, CAPILLARY     Status: Abnormal   Collection Time    05/03/14  6:30 PM      Result Value Ref Range   Glucose-Capillary 105 (*) 70 - 99 mg/dL   Comment 1 Documented in Chart     Comment 2 Notify RN    GLUCOSE, CAPILLARY     Status: Abnormal   Collection Time    05/04/14 12:04 AM      Result Value Ref Range   Glucose-Capillary 132 (*) 70 - 99 mg/dL   Comment 1 Notify RN    COMPREHENSIVE METABOLIC PANEL     Status: Abnormal   Collection Time    05/04/14  4:30 AM      Result Value Ref Range   Sodium 139  137 - 147 mEq/L   Potassium 4.6  3.7 - 5.3 mEq/L   Chloride 101  96 - 112 mEq/L   CO2 26  19 - 32 mEq/L   Glucose, Bld 186 (*) 70 - 99 mg/dL   BUN 59 (*) 6 - 23 mg/dL   Creatinine, Ser 5.27 (*) 0.50 - 1.10 mg/dL   Calcium 9.8  8.4 - 10.5 mg/dL   Total Protein 6.0  6.0 - 8.3 g/dL   Albumin 2.0 (*) 3.5 - 5.2 g/dL   AST 20  0 - 37 U/L   ALT 11  0 - 35 U/L   Alkaline Phosphatase 187 (*) 39 - 117 U/L   Total Bilirubin <0.2 (*) 0.3 - 1.2 mg/dL   GFR calc non Af Amer 8 (*) >90 mL/min   GFR calc Af Wyvonnia Lora  9 (*) >90 mL/min  MAGNESIUM     Status: None   Collection Time    05/04/14  4:30 AM      Result Value Ref Range   Magnesium 2.1  1.5 - 2.5 mg/dL  PHOSPHORUS     Status: Abnormal   Collection Time    05/04/14  4:30 AM      Result Value Ref Range   Phosphorus 6.3 (*) 2.3 - 4.6 mg/dL  TRIGLYCERIDES     Status: Abnormal   Collection Time    05/04/14  4:30 AM      Result Value Ref Range   Triglycerides 158 (*) <150 mg/dL  CBC     Status: Abnormal   Collection Time    05/04/14  4:30 AM      Result Value Ref Range   WBC 4.9  4.0 - 10.5 K/uL   RBC 3.06 (*) 3.87 - 5.11 MIL/uL   Hemoglobin 8.9 (*) 12.0 - 15.0 g/dL   HCT 27.4 (*) 36.0 - 46.0 %   MCV 89.5  78.0 - 100.0 fL   MCH 29.1  26.0 - 34.0 pg   MCHC 32.5  30.0 - 36.0 g/dL   RDW 15.4  11.5 - 15.5 %   Platelets 173  150 - 400 K/uL  DIFFERENTIAL     Status: None   Collection Time    05/04/14  4:30 AM      Result Value Ref Range   Neutrophils Relative % 70  43 - 77 %   Neutro Abs 3.4  1.7 - 7.7 K/uL   Lymphocytes Relative 22  12 - 46 %   Lymphs Abs 1.1  0.7 - 4.0 K/uL   Monocytes Relative 5  3 - 12 %   Monocytes Absolute 0.3  0.1 - 1.0 K/uL   Eosinophils Relative  2  0 - 5 %   Eosinophils Absolute 0.1  0.0 - 0.7 K/uL   Basophils Relative 0  0 - 1 %   Basophils Absolute 0.0  0.0 - 0.1 K/uL  GLUCOSE, CAPILLARY     Status: Abnormal   Collection Time    05/04/14  6:14 AM      Result Value Ref Range   Glucose-Capillary 105 (*) 70 - 99 mg/dL   Comment 1 Notify RN        Studies/Results: US Renal  05/03/2014   CLINICAL DATA:  Acute on chronic renal failure. History of bilateral ureteral stents.  EXAM: RENAL/URINARY TRACT ULTRASOUND COMPLETE  COMPARISON:  CT, 04/08/2014  FINDINGS: Right Kidney:  Length: 12.7 cm. Moderate hydronephrosis. Stent curls within the renal pelvis. Normal parenchymal echogenicity. No mass or stone.  Left Kidney:  Length: 12.3 cm. Mild to moderate hydronephrosis. Stent curls in the renal pelvis. No mass or stone.  Bladder:  Decompressed.  Distal aspects of both stents were visualized.  IMPRESSION: 1. Moderate right and mild to moderate left hydronephrosis despite the presence of bilateral ureteral stents. Severity of the hydronephrosis, however, has improved, more notably on the left. 2. No other abnormalities.   Electronically Signed   By: Lajean Manes M.D.   On: 05/03/2014 09:57    Medications:  Prior to Admission:  Prescriptions prior to admission  Medication Sig Dispense Refill  . antiseptic oral rinse (BIOTENE) LIQD 15 mLs by Mouth Rinse route as needed (for dry mouth).      . fat emulsion 20 % infusion Inject 100 mLs into the vein 2 (two) times a week. Wed and  Sat      . ondansetron (ZOFRAN) 4 MG/2ML SOLN injection Inject 4 mg into the vein every 6 (six) hours as needed for nausea or vomiting.      Marland Kitchen oxyCODONE (ROXICODONE INTENSOL) 100 MG/5ML concentrated solution Take 0.3-0.5 mLs (6-10 mg total) by mouth every 4 (four) hours as needed for pain.  30 mL  0  . promethazine (PHENERGAN) 25 MG/ML injection Inject 12.5 mg into the vein every 6 (six) hours as needed for nausea or vomiting.      . TPN ADULT Inject 2,000 mLs into the  vein continuous. 14 hours/day start between 2000-2100 Five days a week With MVI       Scheduled: . heparin  5,000 Units Subcutaneous 3 times per day  . insulin aspart  0-9 Units Subcutaneous 4 times per day   Continuous: . dextrose 5 % and 0.45% NaCl 35 mL/hr at 05/04/14 0423  . fat emulsion 250 mL (05/03/14 1704)  . Marland KitchenTPN (CLINIMIX-E) Adult 40 mL/hr at 05/03/14 1704   [id number] injection, ondansetron (ZOFRAN) IV, oxyCODONE, promethazine  Assessment/Plan: Acute on chronic renal failure, patient is status post cystoscopy with replacement of stents. Her creatinine has not improved however she's tolerating oral, tolerated IV fluids as she continues with good urine output.  Anemia, followup hemoglobin stable, patient hemodynamically stable. 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 Patient tolerating TPN    LOS: 2 days   Kandice Hams 05/04/2014, 8:44 AM

## 2014-05-05 LAB — BASIC METABOLIC PANEL
BUN: 55 mg/dL — ABNORMAL HIGH (ref 6–23)
CO2: 27 mEq/L (ref 19–32)
Calcium: 9.7 mg/dL (ref 8.4–10.5)
Chloride: 99 mEq/L (ref 96–112)
Creatinine, Ser: 5.06 mg/dL — ABNORMAL HIGH (ref 0.50–1.10)
GFR calc Af Amer: 9 mL/min — ABNORMAL LOW (ref >90)
GFR calc non Af Amer: 8 mL/min — ABNORMAL LOW (ref >90)
Glucose, Bld: 117 mg/dL — ABNORMAL HIGH (ref 70–99)
POTASSIUM: 3.7 meq/L (ref 3.7–5.3)
SODIUM: 139 meq/L (ref 137–147)

## 2014-05-05 LAB — GLUCOSE, CAPILLARY
GLUCOSE-CAPILLARY: 109 mg/dL — AB (ref 70–99)
GLUCOSE-CAPILLARY: 113 mg/dL — AB (ref 70–99)
Glucose-Capillary: 111 mg/dL — ABNORMAL HIGH (ref 70–99)
Glucose-Capillary: 116 mg/dL — ABNORMAL HIGH (ref 70–99)

## 2014-05-05 LAB — MAGNESIUM: Magnesium: 1.7 mg/dL (ref 1.5–2.5)

## 2014-05-05 LAB — PHOSPHORUS: PHOSPHORUS: 4.9 mg/dL — AB (ref 2.3–4.6)

## 2014-05-05 MED ORDER — KCL IN DEXTROSE-NACL 40-5-0.45 MEQ/L-%-% IV SOLN
INTRAVENOUS | Status: DC
  Administered 2014-05-05: 12:00:00 via INTRAVENOUS
  Filled 2014-05-05 (×2): qty 1000

## 2014-05-05 MED ORDER — M.V.I. ADULT IV INJ
INTRAVENOUS | Status: DC
  Administered 2014-05-05: 18:00:00 via INTRAVENOUS
  Filled 2014-05-05: qty 1000

## 2014-05-05 MED ORDER — FAT EMULSION 20 % IV EMUL
250.0000 mL | INTRAVENOUS | Status: DC
  Administered 2014-05-05: 250 mL via INTRAVENOUS
  Filled 2014-05-05: qty 250

## 2014-05-05 NOTE — Progress Notes (Signed)
Subjective: Patient denies any new complaints overnight. No fever chills chest pain shortness of breath. Still tolerating by mouth intake. Renal function improved minimally. Case discussed with daughters in detail. They were hoping for discharge to home however as creatinine is not trending down significantly have recommended continued hospitalization to monitor  Objective: Vital signs in last 24 hours: Temp:  [98.4 F (36.9 C)-98.6 F (37 C)] 98.4 F (36.9 C) (05/17 0500) Pulse Rate:  [78-80] 79 (05/17 0500) Resp:  [15-16] 16 (05/17 0500) BP: (168-191)/(81-88) 168/88 mmHg (05/17 0605) SpO2:  [100 %] 100 % (05/17 0500) Weight change:  Last BM Date: 05/03/14  Intake/Output from previous day: 05/16 0701 - 05/17 0700 In: 480 [P.O.:480] Out: 3650 [Urine:2150; Drains:1500] Intake/Output this shift: Total I/O In: -  Out: 300 [Urine:300]  Resp: clear to auscultation bilaterally Cardio: regular rate and rhythm, S1, S2 normal, no murmur, click, rub or gallop GI: Soft, positive bowel sounds, ostomy in place Extremities: Left leg edema, unchanged otherwise extremity exam unremarkable  Lab Results:  Results for orders placed during the hospital encounter of 05/02/14 (from the past 24 hour(s))  GLUCOSE, CAPILLARY     Status: Abnormal   Collection Time    05/04/14 12:48 PM      Result Value Ref Range   Glucose-Capillary 126 (*) 70 - 99 mg/dL   Comment 1 Documented in Chart     Comment 2 Notify RN    GLUCOSE, CAPILLARY     Status: None   Collection Time    05/04/14  6:06 PM      Result Value Ref Range   Glucose-Capillary 99  70 - 99 mg/dL   Comment 1 Documented in Chart     Comment 2 Notify RN    GLUCOSE, CAPILLARY     Status: Abnormal   Collection Time    05/04/14 11:53 PM      Result Value Ref Range   Glucose-Capillary 112 (*) 70 - 99 mg/dL   Comment 1 Notify RN    BASIC METABOLIC PANEL     Status: Abnormal   Collection Time    05/05/14  4:30 AM      Result Value Ref Range    Sodium 139  137 - 147 mEq/L   Potassium 3.7  3.7 - 5.3 mEq/L   Chloride 99  96 - 112 mEq/L   CO2 27  19 - 32 mEq/L   Glucose, Bld 117 (*) 70 - 99 mg/dL   BUN 55 (*) 6 - 23 mg/dL   Creatinine, Ser 5.06 (*) 0.50 - 1.10 mg/dL   Calcium 9.7  8.4 - 10.5 mg/dL   GFR calc non Af Amer 8 (*) >90 mL/min   GFR calc Af Amer 9 (*) >90 mL/min  MAGNESIUM     Status: None   Collection Time    05/05/14  4:30 AM      Result Value Ref Range   Magnesium 1.7  1.5 - 2.5 mg/dL  PHOSPHORUS     Status: Abnormal   Collection Time    05/05/14  4:30 AM      Result Value Ref Range   Phosphorus 4.9 (*) 2.3 - 4.6 mg/dL  GLUCOSE, CAPILLARY     Status: Abnormal   Collection Time    05/05/14  6:01 AM      Result Value Ref Range   Glucose-Capillary 111 (*) 70 - 99 mg/dL   Comment 1 Notify RN        Studies/Results: US Renal  05/03/2014   CLINICAL DATA:  Acute on chronic renal failure. History of bilateral ureteral stents.  EXAM: RENAL/URINARY TRACT ULTRASOUND COMPLETE  COMPARISON:  CT, 04/08/2014  FINDINGS: Right Kidney:  Length: 12.7 cm. Moderate hydronephrosis. Stent curls within the renal pelvis. Normal parenchymal echogenicity. No mass or stone.  Left Kidney:  Length: 12.3 cm. Mild to moderate hydronephrosis. Stent curls in the renal pelvis. No mass or stone.  Bladder:  Decompressed.  Distal aspects of both stents were visualized.  IMPRESSION: 1. Moderate right and mild to moderate left hydronephrosis despite the presence of bilateral ureteral stents. Severity of the hydronephrosis, however, has improved, more notably on the left. 2. No other abnormalities.   Electronically Signed   By: Lajean Manes M.D.   On: 05/03/2014 09:57    Medications:  Prior to Admission:  Prescriptions prior to admission  Medication Sig Dispense Refill  . antiseptic oral rinse (BIOTENE) LIQD 15 mLs by Mouth Rinse route as needed (for dry mouth).      . fat emulsion 20 % infusion Inject 100 mLs into the vein 2 (two) times a  week. Wed and Sat      . ondansetron (ZOFRAN) 4 MG/2ML SOLN injection Inject 4 mg into the vein every 6 (six) hours as needed for nausea or vomiting.      Marland Kitchen oxyCODONE (ROXICODONE INTENSOL) 100 MG/5ML concentrated solution Take 0.3-0.5 mLs (6-10 mg total) by mouth every 4 (four) hours as needed for pain.  30 mL  0  . promethazine (PHENERGAN) 25 MG/ML injection Inject 12.5 mg into the vein every 6 (six) hours as needed for nausea or vomiting.      . TPN ADULT Inject 2,000 mLs into the vein continuous. 14 hours/day start between 2000-2100 Five days a week With MVI       Scheduled: . heparin  5,000 Units Subcutaneous 3 times per day  . insulin aspart  0-9 Units Subcutaneous 4 times per day   Continuous: . dextrose 5 % and 0.45% NaCl 35 mL/hr at 05/04/14 0423  . TPN (CLINIMIX) Adult without lytes 40 mL/hr at 05/04/14 1746   And  . fat emulsion 250 mL (05/04/14 1745)   XFG:[id number] injection, ondansetron (ZOFRAN) IV, oxyCODONE, promethazine  Assessment/Plan: Acute on chronic renal failure, patient is status post cystoscopy with replacement of stents. She continues with good urine output, creatinine has stabilized and appears to be slowly trending down. Currently no signs of volume overload, electrolytes within normal limits. We will continue to monitor trend  Anemia, followup hemoglobin stable, patient hemodynamically stable. 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 Patient tolerating TPN   LOS: 3 days   Lori Dennis 05/05/2014, 9:40 AM

## 2014-05-05 NOTE — Progress Notes (Addendum)
PARENTERAL NUTRITION CONSULT NOTE - Follow Up  Pharmacy Consult for TNA  Indication: intolerance to enteral feedings  No Known Allergies  Patient Measurements: Height: 5' 3.5" (161.3 cm) Weight: 132 lb 15 oz (60.3 kg) IBW/kg (Calculated) : 53.55  Vital Signs: Temp: 98.4 F (36.9 C) (05/17 0500) Temp src: Oral (05/17 0500) BP: 168/88 mmHg (05/17 0605) Pulse Rate: 79 (05/17 0500) Intake/Output from previous day: 05/16 0701 - 05/17 0700 In: 480 [P.O.:480] Out: 3650 [Urine:2150; Drains:1500] Intake/Output from this shift: Total I/O In: -  Out: 300 [Urine:300]  Labs:  Recent Labs  05/02/14 1817 05/03/14 0530 05/04/14 0430  WBC 6.0 5.2 4.9  HGB 7.8* 7.2* 8.9*  HCT 24.1* 22.3* 27.4*  PLT 230 200 173    Recent Labs  05/02/14 1257  05/02/14 1817 05/03/14 0530 05/04/14 0430 05/05/14 0430  NA 145  --   --  138 139 139  K 4.5  --   --  4.6 4.6 3.7  CL  --   --   --  102 101 99  CO2 21*  --   --  24 26 27   GLUCOSE 98  --   --  88 186* 117*  BUN 69.6*  --   --  64* 59* 55*  CREATININE 5.1*  < > 5.19* 5.32* 5.27* 5.06*  CALCIUM 11.6*  --   --  10.5 9.8 9.7  MG  --   --  2.3  --  2.1 1.7  PHOS  --   --  6.3*  --  6.3* 4.9*  PROT 6.7  --   --   --  6.0  --   ALBUMIN 2.2*  --   --   --  2.0*  --   AST 17  --   --   --  20  --   ALT 11  --   --   --  11  --   ALKPHOS 191*  --   --   --  187*  --   BILITOT 0.24  --   --   --  <0.2*  --   PREALBUMIN  --   --   --   --  22.7  --   TRIG  --   --   --   --  158*  --   < > = values in this interval not displayed. Estimated Creatinine Clearance: 9.1 ml/min (by C-G formula based on Cr of 5.06).   Recent Labs  05/04/14 1806 05/04/14 2353 05/05/14 0601  GLUCAP 99 112* 111*   Medications:  Scheduled:  . heparin  5,000 Units Subcutaneous 3 times per day  . insulin aspart  0-9 Units Subcutaneous 4 times per day   Infusions:  . dextrose 5 % and 0.45 % NaCl with KCl 40 mEq/L    . TPN (CLINIMIX) Adult without lytes 40  mL/hr at 05/04/14 1746   And  . fat emulsion 250 mL (05/04/14 1745)   Insulin Requirements in the past 24 hours:  1 units, sensitive SSI Novolog: CBG's 126-111  Current Nutrition:  Regular diet from 5/15  IVF:  D5W 1/2NS @ 6m/hr  Assessment: 89yoF admitted 5/14 with elevated SCr. Patient had recent admission on 04/08/2014 with renal failure and obstructive nephropathy. S/p cystoscopy with BL stent placement 4/20. Pt to have renal stents replaced this admission. Pt with PMH of HTN, CKD, metastatic carcinoid tumor s/p chemo, h/o SBO s/p small bowel, colon resection colostomy, gastrojejunostomy (2009) on home TPN.  Pharmacy has been consulted to continue TNA while inpatient. Patient did not come in with home TNA bag. Started on IVF per [facility] MD and TNA will be started on 5/15  Labs:  Electrolytes: K 4.6-> 3.7, Corr Ca 10.9, Phos reduced 6.3-> 4.9 , Mag -> 1.72, Ca x Phos = 53  Renal Function: AoCKD - SCr 5.06. Will resume TNA with ~0.8g/kg/day protein/day and assess need to adjust protein daily.  Hepatic Function: Alk Phos elevated to 187, AST/ALT WNL  Pre-Albumin: 22.7 (5/16) Triglycerides: 158 (5/16)  CBGs: < 150   Home TNA provided by [facility]:  2 days/week: 1550 kcal/d, 50 gm protein/d, 50 gm 30% fat emulsion  5 days/week: 1049 kcal/d, 50 gm protein/d, no fats  TNA delivered as a cyclic administration: 6.5L/D delivered over 16 hours including 1 hour ramp up and 1 hour ramp down  Nutritional Goals:  RD recommendations: Protein 60-70 gm/day, Kcal 1500-1700/day  Home TNA provides avg/day Sun-Sat with lipids 2 days/week =1192 kcal/day, 50gm protein/day, 14 gm lipids/day  Clinimix E5/20 at 40 ml/hr with 20% lipids MWF will provide an average of 1051 Kcal/day, 48 g protein/day, ~1L fluid/day Clinimix 5/15-no electrolytes at 60 ml/hr with 20% lipids MWF will provide 72 gm protein, 1230 Kcal/day  TNA day#: continued from home  TNA access: PAC, required TPA instillation 5/16 7pm, ? When TNA  hung, charted ~ 6pm - but noted TNA to begin after TPA flushed from central line.  Plan:  At 1800 today:  Continue Clinimix 5/15-no electrolytes at 39m/hr with 20% lipids at 10 ml/hr MWF (will deliver 48gm protein/900 Kcal/day) Plan to advance as tolerated to a goal rate to match RD recommendations when lytes normalized.  TNA to contain standard multivitamins daily and trace elements MWF Famotidine 29mIV to be added to TNA daily as per home TNA regimen with reduced dose due to reduced renal function Continue IVF at 35109mr and add KCl 40 mEq/Liter back to IV fluid with no E-lyte formula TNA lab panels on Mondays & Thursdays  Sensitive scale SSI with CBG checks q6h   Thank you for the consult.  GreMinda DittoarmD Pager [phone]/2015, 11:06 AM

## 2014-05-06 ENCOUNTER — Other Ambulatory Visit: Payer: Self-pay | Admitting: *Deleted

## 2014-05-06 ENCOUNTER — Encounter: Payer: Self-pay | Admitting: Oncology

## 2014-05-06 ENCOUNTER — Encounter (HOSPITAL_COMMUNITY): Payer: Self-pay | Admitting: Urology

## 2014-05-06 DIAGNOSIS — D3A098 Benign carcinoid tumors of other sites: Secondary | ICD-10-CM

## 2014-05-06 LAB — COMPREHENSIVE METABOLIC PANEL
ALT: 9 U/L (ref 0–35)
AST: 14 U/L (ref 0–37)
Albumin: 2.1 g/dL — ABNORMAL LOW (ref 3.5–5.2)
Alkaline Phosphatase: 176 U/L — ABNORMAL HIGH (ref 39–117)
BUN: 54 mg/dL — AB (ref 6–23)
CO2: 28 mEq/L (ref 19–32)
CREATININE: 4.82 mg/dL — AB (ref 0.50–1.10)
Calcium: 9.4 mg/dL (ref 8.4–10.5)
Chloride: 97 mEq/L (ref 96–112)
GFR calc Af Amer: 10 mL/min — ABNORMAL LOW (ref >90)
GFR calc non Af Amer: 9 mL/min — ABNORMAL LOW (ref >90)
Glucose, Bld: 119 mg/dL — ABNORMAL HIGH (ref 70–99)
Potassium: 3.6 mEq/L — ABNORMAL LOW (ref 3.7–5.3)
Sodium: 137 mEq/L (ref 137–147)
Total Bilirubin: <0.2 mg/dL — ABNORMAL LOW (ref 0.3–1.2)
Total Protein: 6.1 g/dL (ref 6.0–8.3)

## 2014-05-06 LAB — CBC
HCT: 23.3 % — ABNORMAL LOW (ref 36.0–46.0)
HEMOGLOBIN: 7.6 g/dL — AB (ref 12.0–15.0)
MCH: 29 pg (ref 26.0–34.0)
MCHC: 32.6 g/dL (ref 30.0–36.0)
MCV: 88.9 fL (ref 78.0–100.0)
Platelets: 219 10*3/uL (ref 150–400)
RBC: 2.62 MIL/uL — ABNORMAL LOW (ref 3.87–5.11)
RDW: 15.4 % (ref 11.5–15.5)
WBC: 5.9 10*3/uL (ref 4.0–10.5)

## 2014-05-06 LAB — GLUCOSE, CAPILLARY
Glucose-Capillary: 125 mg/dL — ABNORMAL HIGH (ref 70–99)
Glucose-Capillary: 126 mg/dL — ABNORMAL HIGH (ref 70–99)

## 2014-05-06 LAB — DIFFERENTIAL
BASOS ABS: 0 10*3/uL (ref 0.0–0.1)
Basophils Relative: 0 % (ref 0–1)
Eosinophils Absolute: 0.2 10*3/uL (ref 0.0–0.7)
Eosinophils Relative: 3 % (ref 0–5)
LYMPHS PCT: 20 % (ref 12–46)
Lymphs Abs: 1.2 10*3/uL (ref 0.7–4.0)
MONO ABS: 0.3 10*3/uL (ref 0.1–1.0)
MONOS PCT: 5 % (ref 3–12)
NEUTROS PCT: 72 % (ref 43–77)
Neutro Abs: 4.2 10*3/uL (ref 1.7–7.7)

## 2014-05-06 LAB — MAGNESIUM: MAGNESIUM: 1.5 mg/dL (ref 1.5–2.5)

## 2014-05-06 LAB — PREALBUMIN: Prealbumin: 22.3 mg/dL (ref 17.0–34.0)

## 2014-05-06 LAB — PHOSPHORUS: PHOSPHORUS: 4.3 mg/dL (ref 2.3–4.6)

## 2014-05-06 LAB — TRIGLYCERIDES: Triglycerides: 115 mg/dL (ref <150)

## 2014-05-06 MED ORDER — HEPARIN SOD (PORK) LOCK FLUSH 100 UNIT/ML IV SOLN
500.0000 [IU] | INTRAVENOUS | Status: AC | PRN
  Administered 2014-05-06: 500 [IU]
  Filled 2014-05-06: qty 5

## 2014-05-06 NOTE — Progress Notes (Signed)
Subjective: Feeling stronger, eating, ambulating  Objective: Vital signs in last 24 hours: Temp:  [98.2 F (36.8 C)-98.8 F (37.1 C)] 98.4 F (36.9 C) (05/18 0543) Pulse Rate:  [77-82] 78 (05/18 0543) Resp:  [16] 16 (05/18 0543) BP: (160-168)/(70-81) 160/72 mmHg (05/18 0543) SpO2:  [99 %-100 %] 99 % (05/18 0543) Weight change:  Last BM Date:  (clear liquid)  Intake/Output from previous day: 05/17 0701 - 05/18 0700 In: 480 [P.O.:480] Out: 4200 [Urine:2200; Drains:2000] Intake/Output this shift: Total I/O In: -  Out: 1350 [Urine:900; Drains:450]  General appearance: alert and cooperative  Lab Results:  Recent Labs  05/04/14 0430 05/06/14 0350  WBC 4.9 5.9  HGB 8.9* 7.6*  HCT 27.4* 23.3*  PLT 173 219   BMET  Recent Labs  05/05/14 0430 05/06/14 0350  NA 139 137  K 3.7 3.6*  CL 99 97  CO2 27 28  GLUCOSE 117* 119*  BUN 55* 54*  CREATININE 5.06* 4.82*  CALCIUM 9.7 9.4    Studies/Results: No results found.  Medications: I have reviewed the patient's current medications.  Assessment/Plan: Principal Problem:  Acute-on-chronic kidney injury Stent replacement 5/15, slow improvement in creatinine Active Problems:  Carcinoid tumor of intestine pain control, prognosis guarded.  HTN (hypertension) follow  Nausea & vomiting symptomatic treatment  L leg swelling, venous doppler US negative Anemia chronic disease and renal disease, iron studies ok, hgb 7.6 today, no indication for transfusion, ?EPO as outpatient FEN, start TPN Disposition  D/C today if [facility] with urology and oncology   LOS: 4 days   Irven Shelling  [phone] 05/06/2014, 6:13 AM

## 2014-05-06 NOTE — Progress Notes (Addendum)
PARENTERAL NUTRITION CONSULT NOTE - Follow Up  Pharmacy Consult for TNA  Indication: intolerance to enteral feedings  No Known Allergies  Patient Measurements: Height: 5' 3.5" (161.3 cm) Weight: 132 lb 15 oz (60.3 kg) IBW/kg (Calculated) : 53.55  Vital Signs: Temp: 98.4 F (36.9 C) (05/18 0543) Temp src: Oral (05/18 0543) BP: 160/72 mmHg (05/18 0543) Pulse Rate: 78 (05/18 0543) Intake/Output from previous day: 05/17 0701 - 05/18 0700 In: 1859.8 [P.O.:480; I.V.:649.8; TPN:730] Out: 4200 [Urine:2200; Drains:2000] Intake/Output from this shift:    Labs:  Recent Labs  05/04/14 0430 05/06/14 0350  WBC 4.9 5.9  HGB 8.9* 7.6*  HCT 27.4* 23.3*  PLT 173 219    Recent Labs  05/04/14 0430 05/05/14 0430 05/06/14 0350  NA 139 139 137  K 4.6 3.7 3.6*  CL 101 99 97  CO2 26 27 28   GLUCOSE 186* 117* 119*  BUN 59* 55* 54*  CREATININE 5.27* 5.06* 4.82*  CALCIUM 9.8 9.7 9.4  MG 2.1 1.7 1.5  PHOS 6.3* 4.9* 4.3  PROT 6.0  --  6.1  ALBUMIN 2.0*  --  2.1*  AST 20  --  14  ALT 11  --  9  ALKPHOS 187*  --  176*  BILITOT <0.2*  --  <0.2*  PREALBUMIN 22.7  --   --   TRIG 158*  --  115   Estimated Creatinine Clearance: 9.6 ml/min (by C-G formula based on Cr of 4.82).   Recent Labs  05/05/14 1747 05/05/14 2348 05/06/14 0534  GLUCAP 109* 113* 125*   Medications:  Scheduled:  . heparin  5,000 Units Subcutaneous 3 times per day  . insulin aspart  0-9 Units Subcutaneous 4 times per day   Infusions:  . dextrose 5 % and 0.45 % NaCl with KCl 40 mEq/L 35 mL/hr at 05/05/14 1226  . TPN (CLINIMIX) Adult without lytes 40 mL/hr at 05/05/14 1808   And  . fat emulsion 250 mL (05/05/14 1809)   Insulin Requirements in the past 24 hours:  0 units, sensitive SSI Novolog  Current Nutrition:  Regular diet from 5/15  IVF:  D5W 1/2NS with KCl 40 mEq @ 66m/hr  Assessment: 89yoF admitted 5/14 with elevated SCr. Patient had recent admission on 04/08/2014 with renal failure and  obstructive nephropathy. S/p cystoscopy with BL stent placement 4/20. Pt to have renal stents replaced this admission. Pt with PMH of HTN, CKD, metastatic carcinoid tumor s/p chemo, h/o SBO s/p small bowel, colon resection colostomy, gastrojejunostomy (2009) on home TPN.  Pharmacy has been consulted to continue TNA while inpatient. Patient did not come in with home TNA bag. [facility] started on 5/15  Labs:  Electrolytes: K 3.6, Corr Ca 10.9, Phos continues to improve, now WNL at 4.3, Mag 1.5, Ca x Phos =47 Renal Function: AoCKD - slowly improving after renal stent repositioning, SCr 4.82 Hepatic Function: Alk Phos with slight improvement, remains elevated to 176, AST/ALT WNL  Pre-Albumin: 22.7 (5/16) Triglycerides: 115 (5/18), 158 (5/16)  CBGs: < 150   [facility]  2 days/week: 1550 kcal/d, 50 gm protein/d, 50 gm 30% fat emulsion  5 days/week: 1049 kcal/d, 50 gm protein/d, no fats  TNA delivered as a cyclic administration: 23.5K/Tdelivered over 16 hours including 1 hour ramp up and 1 hour ramp down  Nutritional Goals:  RD recommendations: Protein 60-70 gm/day, Kcal 1500-1700/day  Home TNA provides avg/day Sun-Sat with lipids 2 days/week =1192 kcal/day,  50gm protein/day, 14 gm lipids/day  Clinimix E5/20 at 40 ml/hr with 20% lipids MWF will provide an average of 1051 Kcal/day, 48 g protein/day, ~1L fluid/day Clinimix 5/15-no electrolytes at 60 ml/hr with 20% lipids MWF will provide 72 gm protein, 1230 Kcal/day  TNA day#: continued from home  [facility] access: [facility]  Plan:  At 1800 today:  Change Clinimix to E5/15 (with improvement in electrolytes) at 63m/hr with 20% lipids at 10 ml/hr MWF (will deliver 48gm protein/900 Kcal/day) Plan to advance as tolerated to a goal rate to match RD recommendations if lytes remian normalized.  TNA to contain standard multivitamins daily and trace elements MWF Famotidine 254mIV to be added to TNA daily as per home TNA  regimen with reduced dose due to reduced renal function Continue IVF at 3558mr TNA lab panels on Mondays & Thursdays  Bmet, mag, and Phos in AM Sensitive scale SSI with CBG checks q6h  Noted patient close to discharge possibly today, no d/c instructions or orders yet written, will follow-up  Thank you for the consult.  JesJohny DrillingharmD, BCPS Pager: [phone]: [phone] 10:02 AM

## 2014-05-06 NOTE — Progress Notes (Signed)
Correction--Per Dr Benay Spice, lab is scheduled for 05/08/14--pt's daughter Amy is aware of the appointment date and time.  SLJ

## 2014-05-06 NOTE — Progress Notes (Signed)
Discharge instructions explained to pt and her daughter. Port remains intact. Pt gets TPN at home. [facility] nurse called and to resume prior orders on 05/10/2014. Discharged to daughter via wheelchair.

## 2014-05-06 NOTE — Progress Notes (Signed)
Patient ID: Lori Dennis, female   DOB: 1946/11/01, 68 y.o.   MRN: [id number]  Her Cr is down just a bit today.  I have not had an opportunity to see her yet today but if she is otherwise ready for discharge she could be sent home.    She just needs f/u with [place] in about 1 month.

## 2014-05-06 NOTE — Progress Notes (Signed)
Called and left messege with daughter Amy on home and cell phone regarding appts details for 05/07/14 and to call back with any questions.  SLJ

## 2014-05-07 ENCOUNTER — Other Ambulatory Visit: Payer: Medicare Other

## 2014-05-08 ENCOUNTER — Other Ambulatory Visit (HOSPITAL_BASED_OUTPATIENT_CLINIC_OR_DEPARTMENT_OTHER): Payer: Medicare Other

## 2014-05-08 ENCOUNTER — Telehealth: Payer: Self-pay | Admitting: *Deleted

## 2014-05-08 DIAGNOSIS — D3A098 Benign carcinoid tumors of other sites: Secondary | ICD-10-CM

## 2014-05-08 DIAGNOSIS — N179 Acute kidney failure, unspecified: Secondary | ICD-10-CM

## 2014-05-08 DIAGNOSIS — C7A00 Malignant carcinoid tumor of unspecified site: Secondary | ICD-10-CM

## 2014-05-08 DIAGNOSIS — C7B8 Other secondary neuroendocrine tumors: Secondary | ICD-10-CM

## 2014-05-08 LAB — COMPREHENSIVE METABOLIC PANEL (CC13)
ALT: 11 U/L (ref 0–55)
ANION GAP: 14 meq/L — AB (ref 3–11)
AST: 15 U/L (ref 5–34)
Albumin: 2.3 g/dL — ABNORMAL LOW (ref 3.5–5.0)
Alkaline Phosphatase: 200 U/L — ABNORMAL HIGH (ref 40–150)
BILIRUBIN TOTAL: 0.4 mg/dL (ref 0.20–1.20)
BUN: 61.6 mg/dL — AB (ref 7.0–26.0)
CALCIUM: 10.5 mg/dL — AB (ref 8.4–10.4)
CO2: 24 meq/L (ref 22–29)
Chloride: 108 mEq/L (ref 98–109)
Creatinine: 4.5 mg/dL (ref 0.6–1.1)
Glucose: 98 mg/dl (ref 70–140)
Potassium: 4.4 mEq/L (ref 3.5–5.1)
Sodium: 146 mEq/L — ABNORMAL HIGH (ref 136–145)
Total Protein: 7 g/dL (ref 6.4–8.3)

## 2014-05-08 LAB — CBC WITH DIFFERENTIAL/PLATELET
BASO%: 0.3 % (ref 0.0–2.0)
BASOS ABS: 0 10*3/uL (ref 0.0–0.1)
EOS ABS: 0.1 10*3/uL (ref 0.0–0.5)
EOS%: 1.8 % (ref 0.0–7.0)
HEMATOCRIT: 25 % — AB (ref 34.8–46.6)
HGB: 7.8 g/dL — ABNORMAL LOW (ref 11.6–15.9)
LYMPH#: 1.1 10*3/uL (ref 0.9–3.3)
LYMPH%: 16.9 % (ref 14.0–49.7)
MCH: 28.4 pg (ref 25.1–34.0)
MCHC: 31.2 g/dL — ABNORMAL LOW (ref 31.5–36.0)
MCV: 90.9 fL (ref 79.5–101.0)
MONO#: 0.3 10*3/uL (ref 0.1–0.9)
MONO%: 4.2 % (ref 0.0–14.0)
NEUT#: 4.8 10*3/uL (ref 1.5–6.5)
NEUT%: 76.8 % (ref 38.4–76.8)
PLATELETS: 273 10*3/uL (ref 145–400)
RBC: 2.75 10*6/uL — ABNORMAL LOW (ref 3.70–5.45)
RDW: 15.8 % — ABNORMAL HIGH (ref 11.2–14.5)
WBC: 6.2 10*3/uL (ref 3.9–10.3)

## 2014-05-08 NOTE — Telephone Encounter (Signed)
Message copied by Domenic Schwab on Wed May 08, 2014  3:28 PM ------      Message from: Betsy Coder B      Created: Wed May 08, 2014  1:16 PM       Please call patient, hb stable, creatinine slightly better, repeat cmet 5/22-here in the AM if she can ------

## 2014-05-08 NOTE — Telephone Encounter (Signed)
Per Dr. Benay Spice; spoke with pt's daughter Kamaiyah Uselton --notified pt hgb stable, creatinine slightly better and will repeat labs 05/10/14 @ [facility].  Pt daughter states they would like to get this down at [facility]; going to beach that morning if possible.  POF completed.

## 2014-05-10 ENCOUNTER — Telehealth: Payer: Self-pay | Admitting: *Deleted

## 2014-05-10 ENCOUNTER — Other Ambulatory Visit (HOSPITAL_BASED_OUTPATIENT_CLINIC_OR_DEPARTMENT_OTHER): Payer: Medicare Other

## 2014-05-10 ENCOUNTER — Ambulatory Visit: Payer: Medicare Other

## 2014-05-10 ENCOUNTER — Other Ambulatory Visit: Payer: Self-pay | Admitting: *Deleted

## 2014-05-10 VITALS — BP 160/79 | HR 82 | Temp 97.2°F

## 2014-05-10 DIAGNOSIS — C7A00 Malignant carcinoid tumor of unspecified site: Secondary | ICD-10-CM

## 2014-05-10 DIAGNOSIS — D3A098 Benign carcinoid tumors of other sites: Secondary | ICD-10-CM

## 2014-05-10 DIAGNOSIS — C7B8 Other secondary neuroendocrine tumors: Secondary | ICD-10-CM

## 2014-05-10 DIAGNOSIS — Z95828 Presence of other vascular implants and grafts: Secondary | ICD-10-CM

## 2014-05-10 LAB — COMPREHENSIVE METABOLIC PANEL (CC13)
ALT: 8 U/L (ref 0–55)
AST: 17 U/L (ref 5–34)
Albumin: 2.4 g/dL — ABNORMAL LOW (ref 3.5–5.0)
Alkaline Phosphatase: 183 U/L — ABNORMAL HIGH (ref 40–150)
Anion Gap: 12 mEq/L — ABNORMAL HIGH (ref 3–11)
BUN: 51.5 mg/dL — ABNORMAL HIGH (ref 7.0–26.0)
CALCIUM: 9.9 mg/dL (ref 8.4–10.4)
CHLORIDE: 108 meq/L (ref 98–109)
CO2: 23 mEq/L (ref 22–29)
Creatinine: 3.6 mg/dL (ref 0.6–1.1)
Glucose: 109 mg/dl (ref 70–140)
Potassium: 4.3 mEq/L (ref 3.5–5.1)
SODIUM: 143 meq/L (ref 136–145)
TOTAL PROTEIN: 6.9 g/dL (ref 6.4–8.3)
Total Bilirubin: 0.38 mg/dL (ref 0.20–1.20)

## 2014-05-10 LAB — CBC WITH DIFFERENTIAL/PLATELET
BASO%: 0.9 % (ref 0.0–2.0)
BASOS ABS: 0.1 10*3/uL (ref 0.0–0.1)
EOS%: 2.2 % (ref 0.0–7.0)
Eosinophils Absolute: 0.1 10*3/uL (ref 0.0–0.5)
HCT: 24 % — ABNORMAL LOW (ref 34.8–46.6)
HGB: 7.6 g/dL — ABNORMAL LOW (ref 11.6–15.9)
LYMPH#: 1 10*3/uL (ref 0.9–3.3)
LYMPH%: 15 % (ref 14.0–49.7)
MCH: 28.6 pg (ref 25.1–34.0)
MCHC: 31.8 g/dL (ref 31.5–36.0)
MCV: 90 fL (ref 79.5–101.0)
MONO#: 0.3 10*3/uL (ref 0.1–0.9)
MONO%: 5 % (ref 0.0–14.0)
NEUT#: 5.3 10*3/uL (ref 1.5–6.5)
NEUT%: 76.9 % — ABNORMAL HIGH (ref 38.4–76.8)
Platelets: 252 10*3/uL (ref 145–400)
RBC: 2.66 10*6/uL — AB (ref 3.70–5.45)
RDW: 15.7 % — AB (ref 11.2–14.5)
WBC: 6.9 10*3/uL (ref 3.9–10.3)

## 2014-05-10 LAB — MAGNESIUM (CC13): MAGNESIUM: 1.9 mg/dL (ref 1.5–2.5)

## 2014-05-10 LAB — PHOSPHORUS: PHOSPHORUS: 3.8 mg/dL (ref 2.3–4.6)

## 2014-05-10 MED ORDER — SODIUM CHLORIDE 0.9 % IJ SOLN
10.0000 mL | INTRAMUSCULAR | Status: DC | PRN
  Administered 2014-05-10: 10 mL via INTRAVENOUS
  Filled 2014-05-10: qty 10

## 2014-05-10 MED ORDER — HEPARIN SOD (PORK) LOCK FLUSH 100 UNIT/ML IV SOLN
500.0000 [IU] | Freq: Once | INTRAVENOUS | Status: AC
  Administered 2014-05-10: 500 [IU] via INTRAVENOUS
  Filled 2014-05-10: qty 5

## 2014-05-10 NOTE — Telephone Encounter (Signed)
Per Dr. Benay Spice; notified pt's daughter Wynetta Emery per MD pt can go to the beach/travel this weekend after reviewing lab work.  Pt's daughter wanted to know "numbers"  Informed crea 3.6 (down from 4.5) and hgb stable at 7.6 (previous 7.8)  Pt. Daughter verbalized understanding and expressed appreciation for call.

## 2014-05-14 ENCOUNTER — Encounter: Payer: Self-pay | Admitting: Oncology

## 2014-05-16 NOTE — Discharge Summary (Signed)
Physician Discharge Summary  Patient ID: Lori Dennis MRN: [id number] DOB/AGE: May 17, 1946 68 y.o.  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 She had been in the hospital several weeks prior for acute renal failure bilateral hydronephrosis and had placement of bilateral double-J stents with improvement in creatinine down to the 3 range. She was admitted and placed on IV fluids. She had bilateral double J stent exchange by Dr. Risa Grill on May 15. She did have anemia with hemoglobin prostate 7.6, iron studies were normal. We opted not to transfuse the patient. She was treated with TPN as she is at home. By discharge creatinine was down to 4.82. She did have some noted swelling in the left leg with venous Doppler ultrasound negative. Her nausea improved. She was discharged in fair condition  Consults: hematology/oncology and urology  Significant Diagnostic Studies: labs: At discharge sodium 137, potassium 3.6, chloride 97, bicarbonate 28, BUN 54, creatinine 4.82, alkaline phosphatase 176 and radiology: Ultrasound: Renal ultrasound shows moderate right and moderate left hydronephrosis  Treatments: IV hydration and surgery: Bilateral replacement of double-J stent  Discharge Exam: Blood pressure 160/72, pulse 78, temperature 98.4 F (36.9 C), temperature source Oral, resp. rate 16, height 5' 3.5" (1.613 m), weight 60.3 kg (132 lb 15 oz), SpO2 99.00%. Resp: clear to auscultation bilaterally Cardio: regular rate and rhythm, S1, S2 normal, no murmur, click, rub or gallop GI: soft,  non-tender; bowel sounds normal; no masses,  no organomegaly  Disposition: 01-Home or Self Care     Medication List         antiseptic oral rinse Liqd  15 mLs by Mouth Rinse route as needed (for dry mouth).     fat emulsion 20 % infusion  Inject 100 mLs into the vein 2 (two) times a week. Wed and Sat     ondansetron 4 MG/2ML Soln injection  Commonly known as:  ZOFRAN  Inject 4 mg into the vein every 6 (six) hours as needed for nausea or vomiting.     oxyCODONE 20 MG/ML concentrated solution  Commonly known as:  ROXICODONE INTENSOL  Take 0.3-0.5 mLs (6-10 mg total) by mouth every 4 (four) hours as needed for pain.     promethazine 25 MG/ML injection  Commonly known as:  PHENERGAN  Inject 12.5 mg into the vein every 6 (six) hours as needed for nausea or vomiting.     TPN ADULT  Inject 2,000 mLs into the vein continuous. 14 hours/day start between 2000-2100 Five days a week With MVI           Follow-up Information   Follow up with Betsy Coder, MD.   Specialty:  Oncology   Contact information:   [place] [address] [phone]       Call Malka So, MD. (for an appt for about 1 month. )    Specialty:  Urology   Contact information:   [place] [address] [phone]       Signed: Irven Shelling 05/16/2014, 7:39 AM

## 2014-05-17 ENCOUNTER — Telehealth: Payer: Self-pay | Admitting: *Deleted

## 2014-05-17 NOTE — Telephone Encounter (Signed)
CBC and CMET reviewed by Dr. Benay Spice: Labs are improving. Called pt with results. Creatinine 2.44. HGB 7.4. Will recheck in one week via home care. Pt voiced understanding.

## 2014-05-27 ENCOUNTER — Telehealth: Payer: Self-pay | Admitting: *Deleted

## 2014-05-27 DIAGNOSIS — D3A098 Benign carcinoid tumors of other sites: Secondary | ICD-10-CM

## 2014-05-27 NOTE — Telephone Encounter (Signed)
Call from pt's daughter asking if she should have labs with office visit 6/9. Reviewed with Dr. Benay Spice: CBC and BMET ordered. Instructed pt to come in :30 earlier than office visit appt. Order to schedulers for lab add on.

## 2014-05-28 ENCOUNTER — Ambulatory Visit: Payer: Medicare Other

## 2014-05-28 ENCOUNTER — Other Ambulatory Visit: Payer: Self-pay | Admitting: *Deleted

## 2014-05-28 ENCOUNTER — Telehealth: Payer: Self-pay | Admitting: Oncology

## 2014-05-28 ENCOUNTER — Other Ambulatory Visit (HOSPITAL_BASED_OUTPATIENT_CLINIC_OR_DEPARTMENT_OTHER): Payer: Medicare Other

## 2014-05-28 ENCOUNTER — Ambulatory Visit (HOSPITAL_BASED_OUTPATIENT_CLINIC_OR_DEPARTMENT_OTHER): Payer: Medicare Other | Admitting: Oncology

## 2014-05-28 ENCOUNTER — Telehealth: Payer: Self-pay | Admitting: *Deleted

## 2014-05-28 VITALS — BP 204/100 | HR 83 | Temp 97.7°F | Resp 20 | Ht 63.0 in | Wt 131.5 lb

## 2014-05-28 DIAGNOSIS — C7A00 Malignant carcinoid tumor of unspecified site: Secondary | ICD-10-CM

## 2014-05-28 DIAGNOSIS — R609 Edema, unspecified: Secondary | ICD-10-CM

## 2014-05-28 DIAGNOSIS — N189 Chronic kidney disease, unspecified: Secondary | ICD-10-CM

## 2014-05-28 DIAGNOSIS — D3A098 Benign carcinoid tumors of other sites: Secondary | ICD-10-CM

## 2014-05-28 DIAGNOSIS — D638 Anemia in other chronic diseases classified elsewhere: Secondary | ICD-10-CM

## 2014-05-28 DIAGNOSIS — N179 Acute kidney failure, unspecified: Secondary | ICD-10-CM

## 2014-05-28 DIAGNOSIS — C7B8 Other secondary neuroendocrine tumors: Secondary | ICD-10-CM

## 2014-05-28 LAB — CBC WITH DIFFERENTIAL/PLATELET
BASO%: 0.9 % (ref 0.0–2.0)
Basophils Absolute: 0.1 10*3/uL (ref 0.0–0.1)
EOS ABS: 0.1 10*3/uL (ref 0.0–0.5)
EOS%: 1.4 % (ref 0.0–7.0)
HCT: 25.2 % — ABNORMAL LOW (ref 34.8–46.6)
HGB: 8 g/dL — ABNORMAL LOW (ref 11.6–15.9)
LYMPH%: 14.2 % (ref 14.0–49.7)
MCH: 29 pg (ref 25.1–34.0)
MCHC: 31.7 g/dL (ref 31.5–36.0)
MCV: 91.4 fL (ref 79.5–101.0)
MONO#: 0.4 10*3/uL (ref 0.1–0.9)
MONO%: 4.8 % (ref 0.0–14.0)
NEUT%: 78.7 % — ABNORMAL HIGH (ref 38.4–76.8)
NEUTROS ABS: 5.8 10*3/uL (ref 1.5–6.5)
Platelets: 307 10*3/uL (ref 145–400)
RBC: 2.76 10*6/uL — AB (ref 3.70–5.45)
RDW: 15.4 % — AB (ref 11.2–14.5)
WBC: 7.4 10*3/uL (ref 3.9–10.3)
lymph#: 1.1 10*3/uL (ref 0.9–3.3)

## 2014-05-28 LAB — BASIC METABOLIC PANEL (CC13)
Anion Gap: 10 mEq/L (ref 3–11)
BUN: 62.5 mg/dL — ABNORMAL HIGH (ref 7.0–26.0)
CO2: 23 meq/L (ref 22–29)
Calcium: 11.8 mg/dL — ABNORMAL HIGH (ref 8.4–10.4)
Chloride: 112 mEq/L — ABNORMAL HIGH (ref 98–109)
Creatinine: 4.3 mg/dL (ref 0.6–1.1)
GLUCOSE: 101 mg/dL (ref 70–140)
Potassium: 4.5 mEq/L (ref 3.5–5.1)
SODIUM: 146 meq/L — AB (ref 136–145)

## 2014-05-28 MED ORDER — AMLODIPINE BESYLATE 5 MG PO TABS: 5.0000 mg | ORAL_TABLET | Freq: Every day | ORAL | Status: DC

## 2014-05-28 NOTE — Progress Notes (Signed)
  [facility] OFFICE PROGRESS NOTE   Diagnosis: Metastatic carcinoid tumor  INTERVAL HISTORY:   Lori Dennis returns as scheduled. She was readmitted on 05/02/2014 with renal failure. She underwent exchange of bilateral ureter stents on 05/03/2014 after an ultrasound confirmed hydronephrosis. On 05/10/2014 the creatinine was down to 3.6. On 05/17/2014 the creatinine returned at 2.44. She reports nausea and vomiting on 05/26/2014. She took Phenergan and the nausea improved. She continues to have output from the colostomy bag. Stable low abdominal pain. Stable left leg edema. She reports increased urine output.  Objective:  Vital signs in last 24 hours:  Blood pressure 204/100, pulse 83, temperature 97.7 F (36.5 C), temperature source Oral, resp. rate 20, height 5\' 3"  (1.6 m), weight 131 lb 8 oz (59.648 kg).    HEENT: Mild whitecoat over the tongue, the mucous membranes are moist Resp: Lungs clear bilaterally Cardio: Regular rate and rhythm GI: Firm in the right lower abdomen with associated tenderness, left lower quadrant colostomy, left upper quadrant G-tube site without evidence of infection Vascular: 2+ edema throughout the left leg  Portacath/PICC-without erythema  Lab Results:  Lab Results  Component Value Date   WBC 7.4 05/28/2014   HGB 8.0* 05/28/2014   HCT 25.2* 05/28/2014   MCV 91.4 05/28/2014   PLT 307 05/28/2014   NEUTROABS 5.8 05/28/2014    Potassium 4.5, sodium 146, chloride 112, BUN 62.5, creatinine 4.3, calcium 11.8   Imaging:  No results found.  Medications: I have reviewed the patient's current medications.  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 Acute renal failure secondary to an obstructing pelvic  mass and a left ureter stone, status post placement of bilateral ureter stents 04/08/2014  Recurrent acute renal failure 05/02/2014 Exchange of bilateral ureter stents 05/03/2014, bilateral 7 French stent 3. history of Gross hematuria-likely secondary to the left ureter stone  4. Chronic left leg edema-negative Doppler 11/03/2012 and 05/03/2014 5. Nutrition-maintained on home TNA  6. Right scalp herpes zoster rash July 2013  7. Pain secondary to progressive carcinomatosis and urinary obstruction-she continues oxycodone as needed  8. anemia secondary to chronic disease,  GU blood loss, and renal failure  9. Elevated calcium-potentially related to renal failure/TNA versus hypercalcemia malignancy. We will repeat a calcium level within the next one to 2 days and deliver pamidronate as indicated. 10. Hypertension    Disposition:  She has persistent renal failure. It is unclear whether the renal failure is related to persistent obstruction versus intrinsic renal failure. She does not appear dehydrated.  We will add amlodipine for hypertension and check a renal ultrasound. I will consult Dr. Jeffie Pollock if there is persistent hydronephrosis. We will ask her to return for a repeat calcium level within the next one to 2 days. She will take a liter of IV fluids each of the next 2 days.  Lori Dennis has advanced metastatic carcinoid tumor. We will likely need to consider moving to a comfort care/hospice approach in the near future.  Lori Pier, MD  05/28/2014  11:54 AM

## 2014-05-28 NOTE — Telephone Encounter (Signed)
Gave pt appt for lab and BP check tomorrow

## 2014-05-28 NOTE — Telephone Encounter (Signed)
Left message on voicemail instructing pt to come in 6/10 for lab and BP check after ultrasound appt. Wait in lobby for results. Requested she call back to confirm instructions.

## 2014-05-28 NOTE — Telephone Encounter (Signed)
Called pt with instructions to begin Norvasc 5 mg today. Rx sent electronically to pharmacy. Pt voiced understanding, knows to expect call from radiology schedulers for renal ultrasound.

## 2014-05-28 NOTE — Telephone Encounter (Signed)
gv and printed aptp sched and avs for pt for July.... °

## 2014-05-29 ENCOUNTER — Other Ambulatory Visit (HOSPITAL_BASED_OUTPATIENT_CLINIC_OR_DEPARTMENT_OTHER): Payer: Medicare Other

## 2014-05-29 ENCOUNTER — Ambulatory Visit (HOSPITAL_BASED_OUTPATIENT_CLINIC_OR_DEPARTMENT_OTHER): Payer: Medicare Other

## 2014-05-29 ENCOUNTER — Other Ambulatory Visit: Payer: Self-pay | Admitting: *Deleted

## 2014-05-29 ENCOUNTER — Other Ambulatory Visit: Payer: Self-pay | Admitting: Oncology

## 2014-05-29 ENCOUNTER — Ambulatory Visit: Payer: Medicare Other

## 2014-05-29 ENCOUNTER — Telehealth: Payer: Self-pay | Admitting: *Deleted

## 2014-05-29 ENCOUNTER — Ambulatory Visit (HOSPITAL_COMMUNITY)
Admission: RE | Admit: 2014-05-29 | Discharge: 2014-05-29 | Disposition: A | Discharge status: Home / Self Care | Payer: Medicare Other | Source: Ambulatory Visit | Attending: Oncology | Admitting: Oncology

## 2014-05-29 ENCOUNTER — Telehealth: Payer: Self-pay | Admitting: Oncology

## 2014-05-29 DIAGNOSIS — N289 Disorder of kidney and ureter, unspecified: Secondary | ICD-10-CM | POA: Insufficient documentation

## 2014-05-29 DIAGNOSIS — N179 Acute kidney failure, unspecified: Secondary | ICD-10-CM

## 2014-05-29 DIAGNOSIS — Z95828 Presence of other vascular implants and grafts: Secondary | ICD-10-CM

## 2014-05-29 DIAGNOSIS — C7A098 Malignant carcinoid tumors of other sites: Secondary | ICD-10-CM | POA: Diagnosis not present

## 2014-05-29 DIAGNOSIS — N189 Chronic kidney disease, unspecified: Secondary | ICD-10-CM | POA: Insufficient documentation

## 2014-05-29 DIAGNOSIS — R509 Fever, unspecified: Secondary | ICD-10-CM | POA: Diagnosis not present

## 2014-05-29 LAB — COMPREHENSIVE METABOLIC PANEL (CC13)
ALBUMIN: 2.2 g/dL — AB (ref 3.5–5.0)
ALT: 14 U/L (ref 0–55)
ANION GAP: 10 meq/L (ref 3–11)
AST: 19 U/L (ref 5–34)
Alkaline Phosphatase: 218 U/L — ABNORMAL HIGH (ref 40–150)
BUN: 64 mg/dL — AB (ref 7.0–26.0)
CHLORIDE: 111 meq/L — AB (ref 98–109)
CO2: 22 mEq/L (ref 22–29)
CREATININE: 4.6 mg/dL — AB (ref 0.6–1.1)
Calcium: 11.6 mg/dL — ABNORMAL HIGH (ref 8.4–10.4)
Glucose: 107 mg/dl (ref 70–140)
Potassium: 4.4 mEq/L (ref 3.5–5.1)
Sodium: 142 mEq/L (ref 136–145)
Total Bilirubin: 0.36 mg/dL (ref 0.20–1.20)
Total Protein: 6.8 g/dL (ref 6.4–8.3)

## 2014-05-29 MED ORDER — SODIUM CHLORIDE 0.9 % IJ SOLN
10.0000 mL | INTRAMUSCULAR | Status: DC | PRN
  Administered 2014-05-29: 10 mL via INTRAVENOUS
  Filled 2014-05-29: qty 10

## 2014-05-29 MED ORDER — SODIUM CHLORIDE 0.9 % IV SOLN
90.0000 mg | Freq: Once | INTRAVENOUS | Status: AC
  Administered 2014-05-29: 90 mg via INTRAVENOUS
  Filled 2014-05-29: qty 10

## 2014-05-29 MED ORDER — HEPARIN SOD (PORK) LOCK FLUSH 100 UNIT/ML IV SOLN
500.0000 [IU] | Freq: Once | INTRAVENOUS | Status: AC
  Administered 2014-05-29: 500 [IU] via INTRAVENOUS
  Filled 2014-05-29: qty 5

## 2014-05-29 MED ORDER — SODIUM CHLORIDE 0.9 % IV SOLN
INTRAVENOUS | Status: DC
  Administered 2014-05-29: 10:00:00 via INTRAVENOUS

## 2014-05-29 NOTE — Telephone Encounter (Signed)
lmonvm for Tiffany in IR re order for IR perc nephrostomy-left. no other orders per 6/10 pof.

## 2014-05-29 NOTE — Telephone Encounter (Signed)
Called pt: Dr. Benay Spice discussed case with Dr. Jeffie Pollock. He recommends percutaneous nephrostomy tube. Referral sent to IR for L nephrostomy tube. Pt states she wants to wait until next week before deciding.  Labs faxed to [facility] pharmacy to note calcium level. May need adjustment in TPN

## 2014-05-29 NOTE — Patient Instructions (Signed)
Pamidronate injection What is this medicine? PAMIDRONATE (pa mi DROE nate) slows calcium loss from bones. It is used to treat high calcium blood levels from cancer or Paget's disease. It is also used to treat bone pain and prevent fractures from certain cancers that have spread to the bone. This medicine may be used for other purposes; ask your health care provider or pharmacist if you have questions. COMMON BRAND NAME(S): Aredia What should I tell my health care provider before I take this medicine? They need to know if you have any of these conditions: -aspirin-sensitive asthma -dental disease -kidney disease -an unusual or allergic reaction to pamidronate, other medicines, foods, dyes, or preservatives -pregnant or trying to get pregnant -breast-feeding How should I use this medicine? This medicine is for infusion into a vein. It is given by a health care professional in a hospital or clinic setting. Talk to your pediatrician regarding the use of this medicine in children. This medicine is not approved for use in children. Overdosage: If you think you have taken too much of this medicine contact a poison control center or emergency room at once. NOTE: This medicine is only for you. Do not share this medicine with others. What if I miss a dose? This does not apply. What may interact with this medicine? -certain antibiotics given by injection -medicines for inflammation or pain like ibuprofen, naproxen -some diuretics like bumetanide, furosemide -cyclosporine -parathyroid hormone -tacrolimus -teriparatide -thalidomide This list may not describe all possible interactions. Give your health care provider a list of all the medicines, herbs, non-prescription drugs, or dietary supplements you use. Also tell them if you smoke, drink alcohol, or use illegal drugs. Some items may interact with your medicine. What should I watch for while using this medicine? Visit your doctor or health care  professional for regular checkups. It may be some time before you see the benefit from this medicine. Do not stop taking your medicine unless your doctor tells you to. Your doctor may order blood tests or other tests to see how you are doing. Women should inform their doctor if they wish to become pregnant or think they might be pregnant. There is a potential for serious side effects to an unborn child. Talk to your health care professional or pharmacist for more information. You should make sure that you get enough calcium and vitamin D while you are taking this medicine. Discuss the foods you eat and the vitamins you take with your health care professional. Some people who take this medicine have severe bone, joint, and/or muscle pain. This medicine may also increase your risk for a broken thigh bone. Tell your doctor right away if you have pain in your upper leg or groin. Tell your doctor if you have any pain that does not go away or that gets worse. What side effects may I notice from receiving this medicine? Side effects that you should report to your doctor or health care professional as soon as possible: -allergic reactions like skin rash, itching or hives, swelling of the face, lips, or tongue -black or tarry stools -changes in vision -eye inflammation, pain -high blood pressure -jaw pain, especially burning or cramping -muscle weakness -numb, tingling pain -swelling of feet or hands -trouble passing urine or change in the amount of urine -unable to move easily Side effects that usually do not require medical attention (report to your doctor or health care professional if they continue or are bothersome): -bone, joint, or muscle pain -constipation -dizzy, drowsy -  fever -headache -loss of appetite -nausea, vomiting -pain at site where injected This list may not describe all possible side effects. Call your doctor for medical advice about side effects. You may report side effects to  FDA at 1-800-FDA-1088. Where should I keep my medicine? This drug is given in a hospital or clinic and will not be stored at home. NOTE: This sheet is a summary. It may not cover all possible information. If you have questions about this medicine, talk to your doctor, pharmacist, or health care provider.  2014, Elsevier/Gold Standard. (2011-06-04 08:49:49)

## 2014-05-29 NOTE — Patient Instructions (Signed)

## 2014-05-30 ENCOUNTER — Emergency Department (HOSPITAL_COMMUNITY): Payer: Medicare Other

## 2014-05-30 ENCOUNTER — Encounter (HOSPITAL_COMMUNITY): Payer: Self-pay | Admitting: Emergency Medicine

## 2014-05-30 ENCOUNTER — Inpatient Hospital Stay (HOSPITAL_COMMUNITY)
Admission: EM | Admit: 2014-05-30 | Discharge: 2014-06-03 | DRG: 829 | Disposition: A | Discharge status: Hospice | Payer: Medicare Other | Attending: Internal Medicine | Admitting: Internal Medicine

## 2014-05-30 ENCOUNTER — Telehealth: Payer: Self-pay | Admitting: *Deleted

## 2014-05-30 DIAGNOSIS — R110 Nausea: Secondary | ICD-10-CM

## 2014-05-30 DIAGNOSIS — C7B8 Other secondary neuroendocrine tumors: Secondary | ICD-10-CM | POA: Diagnosis present

## 2014-05-30 DIAGNOSIS — N189 Chronic kidney disease, unspecified: Secondary | ICD-10-CM

## 2014-05-30 DIAGNOSIS — E41 Nutritional marasmus: Secondary | ICD-10-CM | POA: Diagnosis present

## 2014-05-30 DIAGNOSIS — Z9049 Acquired absence of other specified parts of digestive tract: Secondary | ICD-10-CM

## 2014-05-30 DIAGNOSIS — C7A098 Malignant carcinoid tumors of other sites: Secondary | ICD-10-CM | POA: Diagnosis present

## 2014-05-30 DIAGNOSIS — R64 Cachexia: Secondary | ICD-10-CM | POA: Diagnosis not present

## 2014-05-30 DIAGNOSIS — Z931 Gastrostomy status: Secondary | ICD-10-CM

## 2014-05-30 DIAGNOSIS — N1330 Unspecified hydronephrosis: Secondary | ICD-10-CM

## 2014-05-30 DIAGNOSIS — I129 Hypertensive chronic kidney disease with stage 1 through stage 4 chronic kidney disease, or unspecified chronic kidney disease: Secondary | ICD-10-CM | POA: Diagnosis present

## 2014-05-30 DIAGNOSIS — J90 Pleural effusion, not elsewhere classified: Secondary | ICD-10-CM | POA: Diagnosis present

## 2014-05-30 DIAGNOSIS — Z932 Ileostomy status: Secondary | ICD-10-CM

## 2014-05-30 DIAGNOSIS — G252 Other specified forms of tremor: Secondary | ICD-10-CM

## 2014-05-30 DIAGNOSIS — R112 Nausea with vomiting, unspecified: Secondary | ICD-10-CM

## 2014-05-30 DIAGNOSIS — N184 Chronic kidney disease, stage 4 (severe): Secondary | ICD-10-CM | POA: Diagnosis present

## 2014-05-30 DIAGNOSIS — D638 Anemia in other chronic diseases classified elsewhere: Secondary | ICD-10-CM

## 2014-05-30 DIAGNOSIS — D62 Acute posthemorrhagic anemia: Secondary | ICD-10-CM | POA: Diagnosis not present

## 2014-05-30 DIAGNOSIS — E876 Hypokalemia: Secondary | ICD-10-CM | POA: Diagnosis not present

## 2014-05-30 DIAGNOSIS — N179 Acute kidney failure, unspecified: Secondary | ICD-10-CM

## 2014-05-30 DIAGNOSIS — N2889 Other specified disorders of kidney and ureter: Secondary | ICD-10-CM | POA: Diagnosis present

## 2014-05-30 DIAGNOSIS — D490 Neoplasm of unspecified behavior of digestive system: Secondary | ICD-10-CM

## 2014-05-30 DIAGNOSIS — Z79899 Other long term (current) drug therapy: Secondary | ICD-10-CM | POA: Diagnosis not present

## 2014-05-30 DIAGNOSIS — Z515 Encounter for palliative care: Secondary | ICD-10-CM | POA: Diagnosis not present

## 2014-05-30 DIAGNOSIS — IMO0002 Reserved for concepts with insufficient information to code with codable children: Secondary | ICD-10-CM | POA: Diagnosis not present

## 2014-05-30 DIAGNOSIS — N201 Calculus of ureter: Secondary | ICD-10-CM | POA: Diagnosis present

## 2014-05-30 DIAGNOSIS — D3A00 Benign carcinoid tumor of unspecified site: Secondary | ICD-10-CM | POA: Diagnosis present

## 2014-05-30 DIAGNOSIS — D500 Iron deficiency anemia secondary to blood loss (chronic): Secondary | ICD-10-CM | POA: Diagnosis present

## 2014-05-30 DIAGNOSIS — Z66 Do not resuscitate: Secondary | ICD-10-CM | POA: Diagnosis not present

## 2014-05-30 DIAGNOSIS — N135 Crossing vessel and stricture of ureter without hydronephrosis: Secondary | ICD-10-CM

## 2014-05-30 DIAGNOSIS — R109 Unspecified abdominal pain: Secondary | ICD-10-CM | POA: Diagnosis present

## 2014-05-30 DIAGNOSIS — C7A00 Malignant carcinoid tumor of unspecified site: Secondary | ICD-10-CM

## 2014-05-30 DIAGNOSIS — I10 Essential (primary) hypertension: Secondary | ICD-10-CM

## 2014-05-30 DIAGNOSIS — R532 Functional quadriplegia: Secondary | ICD-10-CM | POA: Diagnosis present

## 2014-05-30 DIAGNOSIS — C799 Secondary malignant neoplasm of unspecified site: Secondary | ICD-10-CM

## 2014-05-30 DIAGNOSIS — G250 Essential tremor: Secondary | ICD-10-CM

## 2014-05-30 DIAGNOSIS — Z8249 Family history of ischemic heart disease and other diseases of the circulatory system: Secondary | ICD-10-CM | POA: Diagnosis not present

## 2014-05-30 DIAGNOSIS — R509 Fever, unspecified: Secondary | ICD-10-CM | POA: Diagnosis present

## 2014-05-30 DIAGNOSIS — R627 Adult failure to thrive: Secondary | ICD-10-CM | POA: Diagnosis present

## 2014-05-30 DIAGNOSIS — D3A098 Benign carcinoid tumors of other sites: Secondary | ICD-10-CM

## 2014-05-30 LAB — URINALYSIS, ROUTINE W REFLEX MICROSCOPIC
BILIRUBIN URINE: NEGATIVE
Glucose, UA: NEGATIVE mg/dL
Ketones, ur: NEGATIVE mg/dL
Leukocytes, UA: NEGATIVE
Nitrite: NEGATIVE
Protein, ur: NEGATIVE mg/dL
Specific Gravity, Urine: 1.007 (ref 1.005–1.030)
UROBILINOGEN UA: 0.2 mg/dL (ref 0.0–1.0)
pH: 6 (ref 5.0–8.0)

## 2014-05-30 LAB — URINE MICROSCOPIC-ADD ON

## 2014-05-30 LAB — CBC WITH DIFFERENTIAL/PLATELET
BASOS ABS: 0 10*3/uL (ref 0.0–0.1)
BASOS PCT: 0 % (ref 0–1)
Eosinophils Absolute: 0 10*3/uL (ref 0.0–0.7)
Eosinophils Relative: 0 % (ref 0–5)
HCT: 23.8 % — ABNORMAL LOW (ref 36.0–46.0)
Hemoglobin: 7.7 g/dL — ABNORMAL LOW (ref 12.0–15.0)
Lymphocytes Relative: 4 % — ABNORMAL LOW (ref 12–46)
Lymphs Abs: 0.3 10*3/uL — ABNORMAL LOW (ref 0.7–4.0)
MCH: 28.8 pg (ref 26.0–34.0)
MCHC: 32.4 g/dL (ref 30.0–36.0)
MCV: 89.1 fL (ref 78.0–100.0)
Monocytes Absolute: 0.2 10*3/uL (ref 0.1–1.0)
Monocytes Relative: 2 % — ABNORMAL LOW (ref 3–12)
NEUTROS PCT: 94 % — AB (ref 43–77)
Neutro Abs: 6 10*3/uL (ref 1.7–7.7)
Platelets: 324 10*3/uL (ref 150–400)
RBC: 2.67 MIL/uL — ABNORMAL LOW (ref 3.87–5.11)
RDW: 15.4 % (ref 11.5–15.5)
WBC: 6.5 10*3/uL (ref 4.0–10.5)

## 2014-05-30 LAB — COMPREHENSIVE METABOLIC PANEL
ALK PHOS: 222 U/L — AB (ref 39–117)
ALT: 13 U/L (ref 0–35)
AST: 19 U/L (ref 0–37)
Albumin: 2.3 g/dL — ABNORMAL LOW (ref 3.5–5.2)
BILIRUBIN TOTAL: 0.4 mg/dL (ref 0.3–1.2)
BUN: 66 mg/dL — ABNORMAL HIGH (ref 6–23)
CO2: 19 mEq/L (ref 19–32)
CREATININE: 4.6 mg/dL — AB (ref 0.50–1.10)
Calcium: 11.8 mg/dL — ABNORMAL HIGH (ref 8.4–10.5)
Chloride: 110 mEq/L (ref 96–112)
GFR calc Af Amer: 10 mL/min — ABNORMAL LOW (ref >90)
GFR calc non Af Amer: 9 mL/min — ABNORMAL LOW (ref >90)
Glucose, Bld: 125 mg/dL — ABNORMAL HIGH (ref 70–99)
POTASSIUM: 4.4 meq/L (ref 3.7–5.3)
Sodium: 147 mEq/L (ref 137–147)
Total Protein: 6.9 g/dL (ref 6.0–8.3)

## 2014-05-30 LAB — I-STAT CG4 LACTIC ACID, ED: Lactic Acid, Venous: 1.72 mmol/L (ref 0.5–2.2)

## 2014-05-30 LAB — LIPASE, BLOOD: Lipase: 59 U/L (ref 11–59)

## 2014-05-30 LAB — MAGNESIUM: Magnesium: 2.2 mg/dL (ref 1.5–2.5)

## 2014-05-30 LAB — PHOSPHORUS: Phosphorus: 5 mg/dL — ABNORMAL HIGH (ref 2.3–4.6)

## 2014-05-30 LAB — TROPONIN I

## 2014-05-30 MED ORDER — MORPHINE SULFATE 2 MG/ML IJ SOLN
2.0000 mg | INTRAMUSCULAR | Status: DC | PRN
  Administered 2014-05-31 – 2014-06-02 (×6): 2 mg via INTRAVENOUS
  Filled 2014-05-30 (×6): qty 1

## 2014-05-30 MED ORDER — FAT EMULSION 20 % IV EMUL
250.0000 mL | INTRAVENOUS | Status: DC
  Administered 2014-05-30: 250 mL via INTRAVENOUS
  Filled 2014-05-30 (×2): qty 250

## 2014-05-30 MED ORDER — MORPHINE SULFATE 4 MG/ML IJ SOLN
4.0000 mg | Freq: Once | INTRAMUSCULAR | Status: AC
  Administered 2014-05-30: 4 mg via INTRAVENOUS
  Filled 2014-05-30: qty 1

## 2014-05-30 MED ORDER — ONDANSETRON HCL 4 MG/2ML IJ SOLN
4.0000 mg | Freq: Four times a day (QID) | INTRAMUSCULAR | Status: DC | PRN
  Administered 2014-05-30 – 2014-06-03 (×3): 4 mg via INTRAVENOUS
  Filled 2014-05-30 (×3): qty 2

## 2014-05-30 MED ORDER — SODIUM CHLORIDE 0.9 % IV SOLN
INTRAVENOUS | Status: DC
  Administered 2014-05-30: 17:00:00 via INTRAVENOUS

## 2014-05-30 MED ORDER — SODIUM CHLORIDE 0.9 % IV SOLN: INTRAVENOUS | Status: DC

## 2014-05-30 MED ORDER — TRACE MINERALS CR-CU-F-FE-I-MN-MO-SE-ZN IV SOLN
INTRAVENOUS | Status: DC
  Administered 2014-05-30: 19:00:00 via INTRAVENOUS
  Filled 2014-05-30: qty 1000

## 2014-05-30 MED ORDER — ONDANSETRON HCL 4 MG/2ML IJ SOLN
4.0000 mg | Freq: Once | INTRAMUSCULAR | Status: AC
  Administered 2014-05-30: 4 mg via INTRAVENOUS
  Filled 2014-05-30: qty 2

## 2014-05-30 MED ORDER — OXYCODONE HCL 5 MG/5ML PO SOLN
6.0000 mg | ORAL | Status: DC | PRN
  Administered 2014-05-30: 10 mg via ORAL
  Filled 2014-05-30: qty 10

## 2014-05-30 MED ORDER — ENOXAPARIN SODIUM 30 MG/0.3ML ~~LOC~~ SOLN
30.0000 mg | SUBCUTANEOUS | Status: DC
  Filled 2014-05-30 (×3): qty 0.3

## 2014-05-30 MED ORDER — ENOXAPARIN SODIUM 40 MG/0.4ML ~~LOC~~ SOLN: 40.0000 mg | SUBCUTANEOUS | Status: DC

## 2014-05-30 MED ORDER — AMLODIPINE BESYLATE 5 MG PO TABS
5.0000 mg | ORAL_TABLET | Freq: Every day | ORAL | Status: DC
  Administered 2014-05-30 – 2014-06-03 (×5): 5 mg via ORAL
  Filled 2014-05-30 (×5): qty 1

## 2014-05-30 MED ORDER — SODIUM CHLORIDE 0.9 % IJ SOLN
10.0000 mL | Freq: Two times a day (BID) | INTRAMUSCULAR | Status: DC
Start: 2014-05-30 — End: 2014-06-03

## 2014-05-30 MED ORDER — DEXTROSE-NACL 5-0.45 % IV SOLN
INTRAVENOUS | Status: DC
  Administered 2014-05-30 – 2014-05-31 (×2): via INTRAVENOUS

## 2014-05-30 MED ORDER — PROMETHAZINE HCL 25 MG/ML IJ SOLN
12.5000 mg | Freq: Four times a day (QID) | INTRAMUSCULAR | Status: DC | PRN
  Filled 2014-05-30: qty 1

## 2014-05-30 MED ORDER — SODIUM CHLORIDE 0.9 % IV BOLUS (SEPSIS)
1000.0000 mL | Freq: Once | INTRAVENOUS | Status: AC
  Administered 2014-05-30: 1000 mL via INTRAVENOUS

## 2014-05-30 MED ORDER — LORAZEPAM 2 MG/ML IJ SOLN: 0.5000 mg | Freq: Four times a day (QID) | INTRAMUSCULAR | Status: DC | PRN

## 2014-05-30 MED ORDER — SODIUM CHLORIDE 0.9 % IJ SOLN
10.0000 mL | INTRAMUSCULAR | Status: DC | PRN
  Administered 2014-06-02: 10 mL

## 2014-05-30 MED ORDER — BIOTENE DRY MOUTH MT LIQD: 15.0000 mL | OROMUCOSAL | Status: DC | PRN

## 2014-05-30 NOTE — Telephone Encounter (Signed)
APP and collaborative nurse notified of this call.  Copy of call documentation placed on provider's desk.  Called ER Charge/Triage nurse notifying her of patient status.  Called patient's daughter [name] at [phone].  At 1100 they have arrived to the ER.  This nurse repeated previous instructions given added that Dr. Gearldine Shown staff agree to go to ER for evaluation and no further orders.  Based on this evaluation the ER staff will take steps per ER process and protocol and call Dr. Benay Spice as needed.

## 2014-05-30 NOTE — ED Notes (Signed)
Pt c/o fever, NV x 1 wk.  Her creatinine was 3 at the beginning of the week but most recently was 4.7.  Pt has a g-tube drainage bag for intestinal obstruction.  Was told yesterday after a renal ultrasound that her kidneys were obstructed.  Afebrile now.

## 2014-05-30 NOTE — Progress Notes (Signed)
PARENTERAL NUTRITION CONSULT NOTE - INITIAL  Pharmacy Consult for TNA Indication: Bowel obstruction  No Known Allergies  Patient Measurements:   Weight: 59.6 kg Height: 63 in Ideal Body Weight: 52.4 kg Adjusted Body Weight:  Usual Weight:   Vital Signs: Temp: 98.6 F (37 C) (06/11 1347) Temp src: Oral (06/11 1347) BP: 175/88 mmHg (06/11 1500) Pulse Rate: 96 (06/11 1500) Intake/Output from previous day:   Intake/Output from this shift: Total I/O In: 1000 [I.V.:1000] Out: -   Labs:  Recent Labs  05/28/14 0931 05/30/14 1145  WBC 7.4 6.5  HGB 8.0* 7.7*  HCT 25.2* 23.8*  PLT 307 324     Recent Labs  05/28/14 0931 05/29/14 0900 05/30/14 1145  NA 146* 142 147  K 4.5 4.4 4.4  CL  --   --  110  CO2 23 22 19   GLUCOSE 101 107 125*  BUN 62.5* 64.0* 66*  CREATININE 4.3* 4.6* 4.60*  CALCIUM 11.8* 11.6* 11.8*  PROT  --  6.8 6.9  ALBUMIN  --  2.2* 2.3*  AST  --  19 19  ALT  --  14 13  ALKPHOS  --  218* 222*  BILITOT  --  0.36 0.4   The CrCl is unknown because both a height and weight (above a minimum accepted value) are required for this calculation.   No results found for this basename: GLUCAP,  in the last 72 hours  Medical History: Past Medical History  Diagnosis Date  . Complication of anesthesia     slow to wake up  . Blood transfusion without reported diagnosis 2009 and 2011  . Hypertension     off all bp meds since Dec 20, 2012  . Chronic kidney disease 12-19-2012    elevated bun and creatinine   . Cancer 02/19/2008    Metastatic carcinoid tumor    Medications:  Scheduled:  . amLODipine  5 mg Oral Daily   Infusions:  . sodium chloride     PRN: antiseptic oral rinse, morphine injection, ondansetron, oxyCODONE, promethazine  Insulin Requirements in the past 24 hours:  None  Current Nutrition:  Per [facility] records:  Cycle TNA over 14 hours daily which provides 50 grams protein, 1192 kcal per day.  Additives per 2L bag: Sodium  Phosphate 15mmol; Sodium Chloride 218meq; Potassium Chloride 62meq; Calcium Gluconate 5meq; Hyperlyte CR 43ml = 33meqK, 71meq Na, 13meq Ca, 65meq Mg; MVI 68ml; Addamel N 73ml; Famotidine 40mg ; no insulin.  IVF: NS 100 ml/hr  Assessment: 68 yo female with metastatic intestinal carcinoid with chronic ureteral obstruction. She has a gastrostomy tube in place for decompression and is chronic TNA at home. Presents from home with worsening abdominal pain, nausea, vomiting and fever for the past 3 days. Ultrasound yesterday showed severe bilateral hydronephrosis with stents in place; SCr has been rising.  Patient is being admitted 6/11 for palliative care consultation and pain management. Pharmacy is consulted to continue TNA on admission.   Glucose: serum glucose ok  Electrolytes: corrected calcium elevated 14.3 with albumin 2.3; Phos elevated at 5; all others wnl.  LFTs: AlkPhos elevated but consistent with previous results, AST/ALT and TBili wnl  Renal function: SCr rising with obstructed ureters  TGs: check in am  Prealbumin: check in am  Nutritional Goals:  RD consult ordered  TPN Access: implanted port  Plan: At 1800 today:  Start Clinimix 5/15, but will only give 1L over 14hours in order to only provide 50g protein, 1190kcal to match PTA  formulation. 65ml/hr ramp up and down, 72ml/hr x 12hrs.  20% fat emulsion at 54ml/hr for 14hrs.  TNA to contain standard multivitamins and trace elements daily  Reduce IVF to [facility] while TNA infusing, keep 153ml/hr per MD when TNA is off.  Check CBGs 4 times daily (2hr after TNA starts, 1hr after stops, once during TNA infusion and once off TNA) .   TNA lab panels on Mondays & Thursdays.  F/u daily.   Peggyann Juba, PharmD, BCPS Pager: [phone] 05/30/2014,3:47 PM

## 2014-05-30 NOTE — Progress Notes (Signed)
IP PROGRESS NOTE  Subjective:   Lori Dennis is well-known to me with a history of metastatic carcinoid tumor. The creatinine remains elevated despite placement of bilateral ureter stents. She was being scheduled for outpatient placement of a left PCN today. Her family brought her to the emergency room secondary to a low-grade fever, increased nausea, and tremors. She continues to have nausea. She has increased abdominal pain.  The creatinine remains elevated and a CT revealed persistent bilateral hydronephrosis. She is being admitted for further evaluation.  Objective: Vital signs in last 24 hours: Blood pressure 195/93, pulse 99, temperature 98.6 F (37 C), temperature source Oral, resp. rate 17, SpO2 99.00%.  Intake/Output from previous day:    Physical Exam:   Lungs: Clear bilaterally, no respiratory distress Cardiac: Regular rate and rhythm Abdomen: Firm and tender in the right lower abdomen, left lower quadrant colostomy with a small amount of liquid stool, left upper quadrant gastrostomy tube to straight drain Extremities: Edema throughout the left leg Neurologic: Alert, oriented to place and situation, appears mildly confused when answering questions  Portacath/PICC-without erythema  Lab Results:  Recent Labs  05/28/14 0931 05/30/14 1145  WBC 7.4 6.5  HGB 8.0* 7.7*  HCT 25.2* 23.8*  PLT 307 324    BMET  Recent Labs  05/29/14 0900 05/30/14 1145  NA 142 147  K 4.4 4.4  CL  --  110  CO2 22 19  GLUCOSE 107 125*  BUN 64.0* 66*  CREATININE 4.6* 4.60*  CALCIUM 11.6* 11.8*    Studies/Results: Ct Abdomen Pelvis Wo Contrast  05/30/2014   CLINICAL DATA:  Fever with nausea and vomiting. Metastatic carcinoid. Small bowel obstruction. Renal stents. Ultrasound yesterday showed renal obstruction.  EXAM: CT ABDOMEN AND PELVIS WITHOUT CONTRAST  TECHNIQUE: Multidetector CT imaging of the abdomen and pelvis was performed following the standard protocol without IV  contrast.  COMPARISON:  04/08/2014 CT.  FINDINGS: Bones: No aggressive osseous lesions are identified. Lower lumbar spondylosis and facet arthrosis.  Lung Bases: Small right pleural effusion layering dependently with associated compressive atelectasis. Collapse/ consolidation over the bases is most likely atelectasis. There is a tiny left pleural effusion also with some atelectasis. Tiny peripheral pulmonary nodule in the lateral left lower lobe (image 8 series 4).  Diaphragmatic lymph node is present (image 18 series 2) measuring 8 mm short axis, slightly larger than on the prior exam.  Liver: High density is present along the liver surface, compatible with peritoneal carcinomatosis and implantation along the liver capsule. Unenhanced CT was performed per clinician order. Lack of IV contrast limits sensitivity and specificity, especially for evaluation of abdominal/pelvic solid viscera. No definite intrahepatic mass is present.  Spleen:  Normal.  Gallbladder:  No calcified stones.  Partially contracted.  Common bile duct:  Normal.  Pancreas:  Normal.  Adrenal glands: Mild thickening of the left adrenal gland is stable compared to prior. No discrete mass lesion.  Kidneys: Bilateral double-J ureteral stents are present. The proximal loops or appropriately of reconstituted in the renal pelvis. Distal loops are reconstituted in the urinary bladder. There is persistent bilateral hydronephrosis. This was identified on ultrasound yesterday. This may represent stent dysfunction or residual hydronephrosis following stent placement.  Stomach: Gastrostomy tube is present which appears in good position. Gastrojejunostomy present with staples along the inferior stomach. No definite acute gastric findings.  Small bowel: Small bowel is decompressed. No findings to suggest small bowel obstruction. In the left anatomic pelvis, there is an error in fluid containing structure  which appears to represent a loop of small bowel. This is  near the colostomy or MRI ostomy E in the left lower quadrant (image 47 series 2). This was present on the prior exam of 04/08/2014 but not as distended.  This is suboptimally evaluated in the absence of oral contrast. This appears confluent with the pelvic mass that measures slightly larger than on the prior exam, 7.4 x 6.7 cm.  Colon: Left colectomy. It is difficult to distinguish between colon and small bowel in the absence of contrast. The ostomy in the left lower quadrant appears to represent a end colostomy.  Pelvic Genitourinary: Urinary bladder is decompressed. Double-J ureteral stents are reconstituted within the urinary bladder.  Vasculature: Mild atherosclerosis. No acute vascular abnormality on noncontrast CT.  Body Wall: Scarring along the anterior abdominal wall.  IMPRESSION: 1. Small right and trace left pleural effusions. Associated atelectasis. 2. 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 3. No convincing evidence of small bowel obstruction. There is a mildly dilated loop of what bowel in the left lower quadrant that is slightly larger than on prior exam. This is suboptimally evaluated in the absence of oral contrast. If further evaluation is warranted, administration of oral contrast and repeat CT scanning would offer better evaluation. 4. Bilateral hydronephrosis with good position of double-J ureteral stent. Persistent hydronephrosis may be residual following decompression or represent stent dysfunction. 5. Increasing size of mass in the anatomic pelvis compatible with carcinoid tumor.   Electronically Signed   By: Dereck Ligas M.D.   On: 05/30/2014 13:00   US Renal  05/29/2014   CLINICAL DATA:  Acute and chronic renal insufficiency. ; history bilateral renal stents  EXAM: RENAL/URINARY TRACT ULTRASOUND COMPLETE  COMPARISON:  Renal ultrasound of May 03, 2014  FINDINGS: Right Kidney:  Length: 10.8 cm. There is moderate to severe persistent  hydronephrosis. A stent is present. There is no focal mass.  Left Kidney:  Length: 11.6 cm. There is mild to moderate persistent hydronephrosis. A stent is present.  Bladder:  Appears normal for degree of bladder distention. Echogenic stents are visible.  IMPRESSION: There remains bilateral hydronephrosis greater on the right than on the left little changed since the previous study. Stents are present bilaterally.   Electronically Signed   By: David  Martinique   On: 05/29/2014 08:25   Dg Chest Port 1 View  05/30/2014   CLINICAL DATA:  Metastatic abdominal carcinoma.  Hypertension.  EXAM: PORTABLE CHEST - 1 VIEW  COMPARISON:  02/06/2013  FINDINGS: Heart, mediastinum hila are unremarkable. Right anterior chest wall power Port-A-Cath has its catheter tip in the lower superior vena cava, stable.  There is blunting of the right hemidiaphragm, new from the prior study. This is likely due to a small effusion with atelectasis. No left pleural effusion. Lungs are otherwise clear. No pneumothorax.  Bony thorax is demineralized but grossly intact.  IMPRESSION: 1. No acute cardiopulmonary disease. 2. Small pleural effusion with atelectasis.   Electronically Signed   By: Lajean Manes M.D.   On: 05/30/2014 11:40   Dg Abd 2 Views  05/30/2014   CLINICAL DATA:  Metastatic abdominal carcinoid.  EXAM: ABDOMEN - 2 VIEW  COMPARISON:  05/30/2014 CT.  04/08/2014 CT.  FINDINGS: Percutaneous gastrostomy and bilateral double-J ureteral stents are present. There is tortuosity of the right double-J ureteral stent. The bowel gas pattern is nonobstructive. Bowel gas is present within the rectum. The abdomen is relatively gasless. There is no plain film evidence  of free air. Right-sided decubitus was obtained.  IMPRESSION: 1. Nonobstructive bowel gas pattern. 2. Bilateral double-J ureteral stents and percutaneous gastrostomy. 3. No acute abnormality on plain film.   Electronically Signed   By: Dereck Ligas M.D.   On: 05/30/2014 12:25     Medications: I have reviewed the patient's current medications.  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 Acute renal failure secondary to an obstructing pelvic mass and a left ureter stone, status post placement of bilateral ureter stents 04/08/2014  Recurrent acute renal failure 05/02/2014  Exchange of bilateral ureter stents 05/03/2014, bilateral 7 French stent Persistent bilateral hydronephrosis on a renal ultrasound 05/29/2014 and an abdominal CT 05/30/2014 3. history of Gross hematuria-likely secondary to the left ureter stone  4. Chronic left leg edema-negative Doppler 11/03/2012 and 05/03/2014  5. Nutrition-maintained on home TNA  6. Right scalp herpes zoster rash July 2013  7. Pain secondary to progressive carcinomatosis and urinary obstruction-she continues oxycodone as needed  8. anemia secondary to chronic disease, GU blood loss, and renal failure  9. Elevated calcium-potentially related to renal failure/TNA versus hypercalcemia malignancy. She received pamidronate 05/29/2014. 10. Hypertension -likely secondary to obstructive nephropathy   Lori Dennis has advanced metastatic carcinoid tumor. I suspect her current symptoms are related to renal failure complicated by hypercalcemia. It is unclear whether the hypercalcemia is related to "hypercalcemia of malignancy "or the renal failure/TNA.  She has persistent bilateral hydronephrosis despite placement of ureter stents. The creatinine improved with the initial placement of stents last month. We discussed placement of percutaneous nephrostomy tubes yesterday and again today. We had planned to place a single left-sided nephrostomy tube as an outpatient to see if this would help the  renal function and palliate her symptoms. A left-sided stone was found on the initial urology evaluation last month that was felt to contribute to acute renal failure then.   Lori Dennis and her family understand the poor overall prognosis. She is considering a comfort care approach with hospice versus trying a percutaneous nephrostomy tube. Lori Dennis is interested in a nephrology consult to discuss the likelihood of a nephrostomy tube helping her renal function.  I reviewed CPR and ACLS issues with Lori Dennis in the presence of multiple family members. She would like to be placed on a no CODE BLUE status. We discussed the [facility] program and the availability of care at [facility]. Her daughter had initially requested a palliative care consult, but it appears the patient and family are more interested in outpatient hospice care.  I discussed the case with the nephrology service this evening and they will see her on 05/31/2014. We will ask interventional radiology to place a palliative nephrostomy tube or tubes based on the discussion with nephrology.    recommendations:  1. Discontinue TNA 2. Nephrology consult 3. no CODE BLUE 4. Phenergan/Ativan as needed for nausea 5. Intravenous hydration with D5 half-normal saline 6. Followup chemistry panel in a.m. 05/31/2014 7. Narcotic analgesics as needed for pain  I will check on her in the a.m. 05/31/2014. I will then be out until 06/03/2014. Please call oncology as needed.    LOS: 0 days   Duwayne Matters, Dominica Severin  05/30/2014, 7:12 PM

## 2014-05-30 NOTE — ED Notes (Signed)
IR paged. Patient and patient's family requesting to talk to IR about nephrostomy tubes. They say they talked and discussed with Dr. Benay Spice yesterday in leng[place] and now need to know about nephrostomy tubes. IR paged.

## 2014-05-30 NOTE — H&P (Addendum)
[facility] History and Physical  Lori Dennis VFI:[id number] DOB: 1946-03-05 DOA: 05/30/2014  Referring physician: Wyvonnia Dusky MD PCP: Irven Shelling, MD   Chief Complaint: fever and chills and vomiting  HPI: Lori Dennis is a 68 y.o. female with metastatic carcinoid tumor, s/p colectomy with ileostomy on TPN, with double J stent due to tumor compression of the ureters presents with  above mentioned complaints starting last night. She had vomiting on Sunday as well. She also has increasing abdominal pain uncontrolled by oral pain medications (8/10). She is currently nauseated but pain is controlled with IV Morphine. Her oncologist and urologist feel that the stents are not working appropriately and she needs nephrostomy tubes placed.  The patient and her family would like her to talk with palliative care but also want information on the nephrostomy tubes. She is being admitted for pain management.    Review of Systems:  Constitutional:  No weight loss, + night sweats, Fevers, chills, fatigue.  HEENT:  + headache today, Difficulty swallowing,Tooth/dental problems,Sore throat,  No sneezing, itching, ear ache, nasal congestion, post nasal drip,  Cardio-vascular:  No chest pain, Orthopnea, PND, + swelling in left lower extremities, palpitations +dizziness GI:  + indigestion, abdominal pain, nausea, vomiting, has an ileostomy+ loss of appetite  Resp:  No shortness of breath with exertion or at rest. No excess mucus, no productive cough, No non-productive cough, No coughing up of blood. Skin:  + rash on face comes and goes GU:  no dysuria, change in color of urine, no urgency or frequency. No flank pain.  Musculoskeletal:  No joint pain or swelling. No decreased range of motion. No back pain.  Psych:  + depression or anxiety but not uncontrolled per patient  Past Medical History  Diagnosis Date  . Complication of anesthesia     slow to wake up  . Blood transfusion  without reported diagnosis 2009 and 2011  . Hypertension     off all bp meds since Dec 20, 2012  . Chronic kidney disease 12-19-2012    elevated bun and creatinine   . Cancer 02/19/2008    Metastatic carcinoid tumor   Past Surgical History  Procedure Laterality Date  . Bowel resection  06/08/2010    ileocolonic resection, enteroenterostomy  . Left colectomy  04/02/2008    Exploratory laparotomy, biopsy of omental nodule, sigmoid colectomy  . Vaginal hysterectomy      at age 66  . Cholecystectomy      age 54  . Portacath placement Right 02/06/2013    Procedure: Ultrasound guided PORT-A-CATH INSERTION with flouro;  Surgeon: [name], MD;  Location: [facility];  Service: General;  Laterality: Right;  . Esophagogastroduodenoscopy N/A 06/28/2013    Procedure: ESOPHAGOGASTRODUODENOSCOPY (EGD);  Surgeon: Winfield Cunas., MD;  Location: [facility];  Service: Endoscopy;  Laterality: N/A;  . Cystoscopy with retrograde pyelogram, ureteroscopy and stent placement Bilateral 04/08/2014    Procedure: CYSTOSCOPY WITH BILATERAL  RETROGRADE PYELOGRAM,  AND  BILATERAL  STENT PLACEMENT;  Surgeon: Irine Seal, MD;  Location: [facility];  Service: Urology;  Laterality: Bilateral;  . Cystoscopy with retrograde pyelogram, ureteroscopy and stent placement Bilateral 05/03/2014    Procedure: CYSTOSCOPY WITH BILATERAL DOUBLE J STENT EXCHANGE, LEFT URETEROSCOPY;  Surgeon: Bernestine Amass, MD;  Location: [facility];  Service: Urology;  Laterality: Bilateral;   Social History:  reports that she has never smoked. She has never used smokeless tobacco. She reports that she does not drink alcohol or use illicit  drugs.  No Known Allergies  Family History  Problem Relation Age of Onset  . Heart disease Mother   . Cancer Sister     ovarian     Prior to Admission medications   Medication Sig Start Date End Date Taking? Authorizing Provider  amLODipine (NORVASC) 5 MG tablet Take 1 tablet (5 mg total) by mouth daily. 05/28/14   Yes Ladell Pier, MD  antiseptic oral rinse (BIOTENE) LIQD 15 mLs by Mouth Rinse route as needed (for dry mouth).   Yes Historical Provider, MD  fat emulsion 20 % infusion Inject 100 mLs into the vein 2 (two) times a week. Wed and Sat   Yes Historical Provider, MD  ondansetron (ZOFRAN) 4 MG/2ML SOLN injection Inject 4 mg into the vein every 6 (six) hours as needed for nausea or vomiting.   Yes Historical Provider, MD  oxyCODONE (ROXICODONE INTENSOL) 100 MG/5ML concentrated solution Take 0.3-0.5 mLs (6-10 mg total) by mouth every 4 (four) hours as needed for pain. 10/11/13  Yes Ladell Pier, MD  promethazine (PHENERGAN) 25 MG/ML injection Inject 12.5 mg into the vein every 6 (six) hours as needed for nausea or vomiting.   Yes Historical Provider, MD  TPN ADULT Inject 2,000 mLs into the vein continuous. 14 hours/day start between 2000-2100 Five days a week With MVI   Yes Historical Provider, MD   Physical Exam: Filed Vitals:   05/30/14 1500  BP: 175/88  Pulse: 96  Temp:   Resp:     BP 175/88  Pulse 96  Temp(Src) 98.6 F (37 C) (Oral)  Resp 18  SpO2 100%  General:  Appears calm and comfortable Eyes: PERRL, normal lids, irises & conjunctiva ENT: grossly normal hearing, very dry lips & tongue Neck: no LAD, masses or thyromegaly Cardiovascular: RRR, no m/r/g. No LE edema. Respiratory: CTA bilaterally, no w/r/r. Normal respiratory effort. Abdomen: soft, drainage tube in mid abdomen, ileostomy with liquid stool.  Skin:  erythematous rash on b/l nose Musculoskeletal: grossly normal tone BUE/BLE Psychiatric:  Appears depressed Neurologic: grossly non-focal.          Labs on Admission:  Basic Metabolic Panel:  Recent Labs Lab 05/28/14 0931 05/29/14 0900 05/30/14 1145  NA 146* 142 147  K 4.5 4.4 4.4  CL  --   --  110  CO2 23 22 19   GLUCOSE 101 107 125*  BUN 62.5* 64.0* 66*  CREATININE 4.3* 4.6* 4.60*  CALCIUM 11.8* 11.6* 11.8*   Liver Function Tests:  Recent  Labs Lab 05/29/14 0900 05/30/14 1145  AST 19 19  ALT 14 13  ALKPHOS 218* 222*  BILITOT 0.36 0.4  PROT 6.8 6.9  ALBUMIN 2.2* 2.3*    Recent Labs Lab 05/30/14 1145  LIPASE 59   No results found for this basename: AMMONIA,  in the last 168 hours CBC:  Recent Labs Lab 05/28/14 0931 05/30/14 1145  WBC 7.4 6.5  NEUTROABS 5.8 6.0  HGB 8.0* 7.7*  HCT 25.2* 23.8*  MCV 91.4 89.1  PLT 307 324   Cardiac Enzymes:  Recent Labs Lab 05/30/14 1145  TROPONINI <0.30    BNP (last 3 results) No results found for this basename: PROBNP,  in the last 8760 hours CBG: No results found for this basename: GLUCAP,  in the last 168 hours  Radiological Exams on Admission: Ct Abdomen Pelvis Wo Contrast  05/30/2014   CLINICAL DATA:  Fever with nausea and vomiting. Metastatic carcinoid. Small bowel obstruction. Renal stents. Ultrasound yesterday showed renal  obstruction.  EXAM: CT ABDOMEN AND PELVIS WITHOUT CONTRAST  TECHNIQUE: Multidetector CT imaging of the abdomen and pelvis was performed following the standard protocol without IV contrast.  COMPARISON:  04/08/2014 CT.  FINDINGS: Bones: No aggressive osseous lesions are identified. Lower lumbar spondylosis and facet arthrosis.  Lung Bases: Small right pleural effusion layering dependently with associated compressive atelectasis. Collapse/ consolidation over the bases is most likely atelectasis. There is a tiny left pleural effusion also with some atelectasis. Tiny peripheral pulmonary nodule in the lateral left lower lobe (image 8 series 4).  Diaphragmatic lymph node is present (image 18 series 2) measuring 8 mm short axis, slightly larger than on the prior exam.  Liver: High density is present along the liver surface, compatible with peritoneal carcinomatosis and implantation along the liver capsule. Unenhanced CT was performed per clinician order. Lack of IV contrast limits sensitivity and specificity, especially for evaluation of abdominal/pelvic  solid viscera. No definite intrahepatic mass is present.  Spleen:  Normal.  Gallbladder:  No calcified stones.  Partially contracted.  Common bile duct:  Normal.  Pancreas:  Normal.  Adrenal glands: Mild thickening of the left adrenal gland is stable compared to prior. No discrete mass lesion.  Kidneys: Bilateral double-J ureteral stents are present. The proximal loops or appropriately of reconstituted in the renal pelvis. Distal loops are reconstituted in the urinary bladder. There is persistent bilateral hydronephrosis. This was identified on ultrasound yesterday. This may represent stent dysfunction or residual hydronephrosis following stent placement.  Stomach: Gastrostomy tube is present which appears in good position. Gastrojejunostomy present with staples along the inferior stomach. No definite acute gastric findings.  Small bowel: Small bowel is decompressed. No findings to suggest small bowel obstruction. In the left anatomic pelvis, there is an error in fluid containing structure which appears to represent a loop of small bowel. This is near the colostomy or MRI ostomy E in the left lower quadrant (image 47 series 2). This was present on the prior exam of 04/08/2014 but not as distended.  This is suboptimally evaluated in the absence of oral contrast. This appears confluent with the pelvic mass that measures slightly larger than on the prior exam, 7.4 x 6.7 cm.  Colon: Left colectomy. It is difficult to distinguish between colon and small bowel in the absence of contrast. The ostomy in the left lower quadrant appears to represent a end colostomy.  Pelvic Genitourinary: Urinary bladder is decompressed. Double-J ureteral stents are reconstituted within the urinary bladder.  Vasculature: Mild atherosclerosis. No acute vascular abnormality on noncontrast CT.  Body Wall: Scarring along the anterior abdominal wall.  IMPRESSION: 1. Small right and trace left pleural effusions. Associated atelectasis. 2.  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 3. No convincing evidence of small bowel obstruction. There is a mildly dilated loop of what bowel in the left lower quadrant that is slightly larger than on prior exam. This is suboptimally evaluated in the absence of oral contrast. If further evaluation is warranted, administration of oral contrast and repeat CT scanning would offer better evaluation. 4. Bilateral hydronephrosis with good position of double-J ureteral stent. Persistent hydronephrosis may be residual following decompression or represent stent dysfunction. 5. Increasing size of mass in the anatomic pelvis compatible with carcinoid tumor.   Electronically Signed   By: Dereck Ligas M.D.   On: 05/30/2014 13:00   US Renal  05/29/2014   CLINICAL DATA:  Acute and chronic renal insufficiency. ; history bilateral renal stents  EXAM: RENAL/URINARY TRACT ULTRASOUND COMPLETE  COMPARISON:  Renal ultrasound of May 03, 2014  FINDINGS: Right Kidney:  Length: 10.8 cm. There is moderate to severe persistent hydronephrosis. A stent is present. There is no focal mass.  Left Kidney:  Length: 11.6 cm. There is mild to moderate persistent hydronephrosis. A stent is present.  Bladder:  Appears normal for degree of bladder distention. Echogenic stents are visible.  IMPRESSION: There remains bilateral hydronephrosis greater on the right than on the left little changed since the previous study. Stents are present bilaterally.   Electronically Signed   By: David  Martinique   On: 05/29/2014 08:25   Dg Chest Port 1 View  05/30/2014   CLINICAL DATA:  Metastatic abdominal carcinoma.  Hypertension.  EXAM: PORTABLE CHEST - 1 VIEW  COMPARISON:  02/06/2013  FINDINGS: Heart, mediastinum hila are unremarkable. Right anterior chest wall power Port-A-Cath has its catheter tip in the lower superior vena cava, stable.  There is blunting of the right hemidiaphragm, new from the prior study. This is likely  due to a small effusion with atelectasis. No left pleural effusion. Lungs are otherwise clear. No pneumothorax.  Bony thorax is demineralized but grossly intact.  IMPRESSION: 1. No acute cardiopulmonary disease. 2. Small pleural effusion with atelectasis.   Electronically Signed   By: Lajean Manes M.D.   On: 05/30/2014 11:40   Dg Abd 2 Views  05/30/2014   CLINICAL DATA:  Metastatic abdominal carcinoid.  EXAM: ABDOMEN - 2 VIEW  COMPARISON:  05/30/2014 CT.  04/08/2014 CT.  FINDINGS: Percutaneous gastrostomy and bilateral double-J ureteral stents are present. There is tortuosity of the right double-J ureteral stent. The bowel gas pattern is nonobstructive. Bowel gas is present within the rectum. The abdomen is relatively gasless. There is no plain film evidence of free air. Right-sided decubitus was obtained.  IMPRESSION: 1. Nonobstructive bowel gas pattern. 2. Bilateral double-J ureteral stents and percutaneous gastrostomy. 3. No acute abnormality on plain film.   Electronically Signed   By: Dereck Ligas M.D.   On: 05/30/2014 12:25    EKG: none  Assessment/Plan Principal Problem:   Carcinoid tumor of intestine - obstructing ureters s/p b/l double J stents - per Dr Jocelyn Lamer who the ER Doctor has spoken with, the only option is to do nephrostomy tubes- we have consulted IR -family and patient also want palliative care to be consulted- discussion with palliative care may make pt change her mind about nephrostomy tubes - have also asked ER to contact Dr Ammie Dalton to ensure he is aware that the patient is here - cont TPN- give IVF until TPN can be resumed  Active Problems:   HTN (hypertension) - will cont the Norvasc recently started by Dr Ammie Dalton    Nausea & vomiting - Zofran PRN    Fever  - no bacterial source found- will hold off on antibiotics and follow temps  Renal failure - labs from 2014 reveal normal BUN/Cr which suddenly worsened in 4/15.  - supect compression of ureteres causing renal  failure    Dr Ammie Dalton consulted  Code Status: Full code but does not want to be kept alive if she will not recover she has a living will and POAs (must indicate code status--if unknown or must be presumed, indicate so) Family Communication: daughters Disposition Plan: to be determined  Time spent: >45 min  [place] [facility] Pager: [email or web address]

## 2014-05-30 NOTE — ED Notes (Signed)
Lori Dennis and I tried walking patient but she was unable to

## 2014-05-30 NOTE — ED Provider Notes (Signed)
CSN: [id number]     Arrival date & time 05/30/14  1053 History   First MD Initiated Contact with Patient 05/30/14 1108     Chief Complaint  Patient presents with  . Fever  . Nausea  . Emesis  . Cancer     (Consider location/radiation/quality/duration/timing/severity/associated sxs/prior Treatment) HPI Comments: Patient presents from home with worsening abdominal pain, nausea, vomiting and fever for the past 3 days. Fever up to 101 at home. 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 Ultrasound yesterday showed severe bilateral hydronephrosis with stents in place. Her creatinine has been rising. Patient has had decreased output from her ostomy as well as increased abdominal pain. She normally has TPN at home. She is a gastrostomy tube in place for decompression. She is not getting any chemotherapy or radiation. She's had multiple bowel resection surgeries in the past. She denies any chest pain or shortness of breath. She has chronic swelling in her left leg.  Oncologic history.  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 Acute renal failure secondary to an obstructing pelvic mass and a left ureter stone, status post placement of bilateral ureter stents 04/08/2014  Recurrent acute renal failure 05/02/2014 Exchange of bilateral ureter stents 05/03/2014, bilateral 7 French stent  The history is provided by the patient and a relative.    Past Medical History  Diagnosis Date  . Complication of anesthesia     slow to wake up  . Blood transfusion without reported diagnosis 2009 and 2011  . Hypertension     off all bp meds since Dec 20, 2012  . Chronic kidney disease 12-19-2012    elevated bun and creatinine   . Cancer  02/19/2008    Metastatic carcinoid tumor   Past Surgical History  Procedure Laterality Date  . Bowel resection  06/08/2010    ileocolonic resection, enteroenterostomy  . Left colectomy  04/02/2008    Exploratory laparotomy, biopsy of omental nodule, sigmoid colectomy  . Vaginal hysterectomy      at age 42  . Cholecystectomy      age 16  . Portacath placement Right 02/06/2013    Procedure: Ultrasound guided PORT-A-CATH INSERTION with flouro;  Surgeon: [name], MD;  Location: [facility];  Service: General;  Laterality: Right;  . Esophagogastroduodenoscopy N/A 06/28/2013    Procedure: ESOPHAGOGASTRODUODENOSCOPY (EGD);  Surgeon: Winfield Cunas., MD;  Location: [facility];  Service: Endoscopy;  Laterality: N/A;  . Cystoscopy with retrograde pyelogram, ureteroscopy and stent placement Bilateral 04/08/2014    Procedure: CYSTOSCOPY WITH BILATERAL  RETROGRADE PYELOGRAM,  AND  BILATERAL  STENT PLACEMENT;  Surgeon: Irine Seal, MD;  Location: [facility];  Service: Urology;  Laterality: Bilateral;  . Cystoscopy with retrograde pyelogram, ureteroscopy and stent placement Bilateral 05/03/2014    Procedure: CYSTOSCOPY WITH BILATERAL DOUBLE J STENT EXCHANGE, LEFT URETEROSCOPY;  Surgeon: Bernestine Amass, MD;  Location: [facility];  Service: Urology;  Laterality: Bilateral;   Family History  Problem Relation Age of Onset  . Heart disease Mother   . Cancer Sister     ovarian   History  Substance Use Topics  . Smoking status: Never Smoker   . Smokeless tobacco: Never Used  . Alcohol Use: No   OB History   Grav Para Term Preterm Abortions TAB SAB Ect Mult Living  Review of Systems  Constitutional: Positive for fever, activity change, appetite change and fatigue.  Respiratory: Negative for cough, chest tightness and shortness of breath.   Cardiovascular: Negative for chest pain.  Gastrointestinal: Positive for nausea, vomiting and abdominal pain.  Genitourinary: Negative for dysuria,  hematuria, vaginal bleeding and vaginal discharge.  Musculoskeletal: Positive for arthralgias, back pain and myalgias.  Skin: Negative for rash.  Neurological: Positive for weakness. Negative for dizziness and headaches.  A complete 10 system review of systems was obtained and all systems are negative except as noted in the HPI and PMH.      Allergies  Review of patient's allergies indicates no known allergies.  Home Medications   Prior to Admission medications   Medication Sig Start Date End Date Taking? Authorizing Provider  amLODipine (NORVASC) 5 MG tablet Take 1 tablet (5 mg total) by mouth daily. 05/28/14  Yes Ladell Pier, MD  antiseptic oral rinse (BIOTENE) LIQD 15 mLs by Mouth Rinse route as needed (for dry mouth).   Yes Historical Provider, MD  fat emulsion 20 % infusion Inject 100 mLs into the vein 2 (two) times a week. Wed and Sat   Yes Historical Provider, MD  ondansetron (ZOFRAN) 4 MG/2ML SOLN injection Inject 4 mg into the vein every 6 (six) hours as needed for nausea or vomiting.   Yes Historical Provider, MD  oxyCODONE (ROXICODONE INTENSOL) 100 MG/5ML concentrated solution Take 0.3-0.5 mLs (6-10 mg total) by mouth every 4 (four) hours as needed for pain. 10/11/13  Yes Ladell Pier, MD  promethazine (PHENERGAN) 25 MG/ML injection Inject 12.5 mg into the vein every 6 (six) hours as needed for nausea or vomiting.   Yes Historical Provider, MD  TPN ADULT Inject 2,000 mLs into the vein continuous. 14 hours/day start between 2000-2100 Five days a week With MVI   Yes Historical Provider, MD   BP 181/87  Pulse 89  Temp(Src) 98.6 F (37 C) (Oral)  Resp 17  SpO2 96% Physical Exam  Constitutional: She is oriented to person, place, and time. She appears well-developed and well-nourished. She appears distressed.  HENT:  Head: Normocephalic and atraumatic.  Mouth/Throat: Oropharynx is clear and moist. No oropharyngeal exudate.  Dry mucus membranes  Eyes: Conjunctivae and EOM  are normal. Pupils are equal, round, and reactive to light.  Neck: Normal range of motion. Neck supple.  Cardiovascular: Normal rate, regular rhythm and normal heart sounds.   Pulmonary/Chest: Effort normal and breath sounds normal. No respiratory distress.  Abdominal: Soft. There is tenderness. There is rebound.  G-tube in place ileostomy in place with gas and stool in bag. Diffuse abdominal tenderness with voluntary guarding.  Musculoskeletal: Normal range of motion. She exhibits no edema.  Neurological: She is alert and oriented to person, place, and time. No cranial nerve deficit. She exhibits normal muscle tone. Coordination normal.  Skin: Skin is warm.    ED Course  Procedures (including critical care time) Labs Review Labs Reviewed  CBC WITH DIFFERENTIAL - Abnormal; Notable for the following:    RBC 2.67 (*)    Hemoglobin 7.7 (*)    HCT 23.8 (*)    Neutrophils Relative % 94 (*)    Lymphocytes Relative 4 (*)    Lymphs Abs 0.3 (*)    Monocytes Relative 2 (*)    All other components within normal limits  COMPREHENSIVE METABOLIC PANEL - Abnormal; Notable for the following:    Glucose, Bld 125 (*)    BUN 66 (*)  Creatinine, Ser 4.60 (*)    Calcium 11.8 (*)    Albumin 2.3 (*)    Alkaline Phosphatase 222 (*)    GFR calc non Af Amer 9 (*)    GFR calc Af Amer 10 (*)    All other components within normal limits  URINALYSIS, ROUTINE W REFLEX MICROSCOPIC - Abnormal; Notable for the following:    Hgb urine dipstick SMALL (*)    All other components within normal limits  PHOSPHORUS - Abnormal; Notable for the following:    Phosphorus 5.0 (*)    All other components within normal limits  CULTURE, BLOOD (ROUTINE X 2)  CULTURE, BLOOD (ROUTINE X 2)  URINE CULTURE  LIPASE, BLOOD  TROPONIN I  URINE MICROSCOPIC-ADD ON  MAGNESIUM  COMPREHENSIVE METABOLIC PANEL  PREALBUMIN  MAGNESIUM  PHOSPHORUS  TRIGLYCERIDES  CBC  DIFFERENTIAL  I-STAT CG4 LACTIC ACID, ED    Imaging  Review Ct Abdomen Pelvis Wo Contrast  05/30/2014   CLINICAL DATA:  Fever with nausea and vomiting. Metastatic carcinoid. Small bowel obstruction. Renal stents. Ultrasound yesterday showed renal obstruction.  EXAM: CT ABDOMEN AND PELVIS WITHOUT CONTRAST  TECHNIQUE: Multidetector CT imaging of the abdomen and pelvis was performed following the standard protocol without IV contrast.  COMPARISON:  04/08/2014 CT.  FINDINGS: Bones: No aggressive osseous lesions are identified. Lower lumbar spondylosis and facet arthrosis.  Lung Bases: Small right pleural effusion layering dependently with associated compressive atelectasis. Collapse/ consolidation over the bases is most likely atelectasis. There is a tiny left pleural effusion also with some atelectasis. Tiny peripheral pulmonary nodule in the lateral left lower lobe (image 8 series 4).  Diaphragmatic lymph node is present (image 18 series 2) measuring 8 mm short axis, slightly larger than on the prior exam.  Liver: High density is present along the liver surface, compatible with peritoneal carcinomatosis and implantation along the liver capsule. Unenhanced CT was performed per clinician order. Lack of IV contrast limits sensitivity and specificity, especially for evaluation of abdominal/pelvic solid viscera. No definite intrahepatic mass is present.  Spleen:  Normal.  Gallbladder:  No calcified stones.  Partially contracted.  Common bile duct:  Normal.  Pancreas:  Normal.  Adrenal glands: Mild thickening of the left adrenal gland is stable compared to prior. No discrete mass lesion.  Kidneys: Bilateral double-J ureteral stents are present. The proximal loops or appropriately of reconstituted in the renal pelvis. Distal loops are reconstituted in the urinary bladder. There is persistent bilateral hydronephrosis. This was identified on ultrasound yesterday. This may represent stent dysfunction or residual hydronephrosis following stent placement.  Stomach: Gastrostomy  tube is present which appears in good position. Gastrojejunostomy present with staples along the inferior stomach. No definite acute gastric findings.  Small bowel: Small bowel is decompressed. No findings to suggest small bowel obstruction. In the left anatomic pelvis, there is an error in fluid containing structure which appears to represent a loop of small bowel. This is near the colostomy or MRI ostomy E in the left lower quadrant (image 47 series 2). This was present on the prior exam of 04/08/2014 but not as distended.  This is suboptimally evaluated in the absence of oral contrast. This appears confluent with the pelvic mass that measures slightly larger than on the prior exam, 7.4 x 6.7 cm.  Colon: Left colectomy. It is difficult to distinguish between colon and small bowel in the absence of contrast. The ostomy in the left lower quadrant appears to represent a end colostomy.  Pelvic Genitourinary:  Urinary bladder is decompressed. Double-J ureteral stents are reconstituted within the urinary bladder.  Vasculature: Mild atherosclerosis. No acute vascular abnormality on noncontrast CT.  Body Wall: Scarring along the anterior abdominal wall.  IMPRESSION: 1. Small right and trace left pleural effusions. Associated atelectasis. 2. 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 3. No convincing evidence of small bowel obstruction. There is a mildly dilated loop of what bowel in the left lower quadrant that is slightly larger than on prior exam. This is suboptimally evaluated in the absence of oral contrast. If further evaluation is warranted, administration of oral contrast and repeat CT scanning would offer better evaluation. 4. Bilateral hydronephrosis with good position of double-J ureteral stent. Persistent hydronephrosis may be residual following decompression or represent stent dysfunction. 5. Increasing size of mass in the anatomic pelvis compatible with carcinoid tumor.    Electronically Signed   By: Dereck Ligas M.D.   On: 05/30/2014 13:00   US Renal  05/29/2014   CLINICAL DATA:  Acute and chronic renal insufficiency. ; history bilateral renal stents  EXAM: RENAL/URINARY TRACT ULTRASOUND COMPLETE  COMPARISON:  Renal ultrasound of May 03, 2014  FINDINGS: Right Kidney:  Length: 10.8 cm. There is moderate to severe persistent hydronephrosis. A stent is present. There is no focal mass.  Left Kidney:  Length: 11.6 cm. There is mild to moderate persistent hydronephrosis. A stent is present.  Bladder:  Appears normal for degree of bladder distention. Echogenic stents are visible.  IMPRESSION: There remains bilateral hydronephrosis greater on the right than on the left little changed since the previous study. Stents are present bilaterally.   Electronically Signed   By: David  Martinique   On: 05/29/2014 08:25   Dg Chest Port 1 View  05/30/2014   CLINICAL DATA:  Metastatic abdominal carcinoma.  Hypertension.  EXAM: PORTABLE CHEST - 1 VIEW  COMPARISON:  02/06/2013  FINDINGS: Heart, mediastinum hila are unremarkable. Right anterior chest wall power Port-A-Cath has its catheter tip in the lower superior vena cava, stable.  There is blunting of the right hemidiaphragm, new from the prior study. This is likely due to a small effusion with atelectasis. No left pleural effusion. Lungs are otherwise clear. No pneumothorax.  Bony thorax is demineralized but grossly intact.  IMPRESSION: 1. No acute cardiopulmonary disease. 2. Small pleural effusion with atelectasis.   Electronically Signed   By: Lajean Manes M.D.   On: 05/30/2014 11:40   Dg Abd 2 Views  05/30/2014   CLINICAL DATA:  Metastatic abdominal carcinoid.  EXAM: ABDOMEN - 2 VIEW  COMPARISON:  05/30/2014 CT.  04/08/2014 CT.  FINDINGS: Percutaneous gastrostomy and bilateral double-J ureteral stents are present. There is tortuosity of the right double-J ureteral stent. The bowel gas pattern is nonobstructive. Bowel gas is present within  the rectum. The abdomen is relatively gasless. There is no plain film evidence of free air. Right-sided decubitus was obtained.  IMPRESSION: 1. Nonobstructive bowel gas pattern. 2. Bilateral double-J ureteral stents and percutaneous gastrostomy. 3. No acute abnormality on plain film.   Electronically Signed   By: Dereck Ligas M.D.   On: 05/30/2014 12:25     EKG Interpretation None      MDM   Final diagnoses:  Metastatic cancer  Ureteral obstruction   metastatic intestinal carcinoid tumor with ongoing urinary tract obstruction. Worsening pain with nausea, vomiting, and fever at home.  Stable anemia.  Stable Cr and hypercalcemia.  UA negative. AAS without obstruction CT shows stable tumor  burden and hydronephrosis without definite bowel obstruction.  Case discussed with Dr. Risa Grill of urology who states he has nothing surgical to offer the patient unfortunately. he recommends interventional radiology consult for nephrostomy tubes if patient and family desire. IR will be consulted. ADmission dw Dr. Wynelle Cleveland.  Dr. Learta Codding also aware of patient presentation and agrees nephrostomy is necessary if patient desires. Dr. Wynelle Cleveland has also consulted palliative care. Family and patient understand she is nearing hospice stage but still wish to consider nephrostomy.  BP 181/87  Pulse 89  Temp(Src) 98.6 F (37 C) (Oral)  Resp 17  SpO2 96%   Ezequiel Essex, MD 05/30/14 Vernelle Emerald

## 2014-05-30 NOTE — ED Notes (Signed)
Dr. Benay Spice in to see patient along with IV Team for TPN intiation.

## 2014-05-30 NOTE — ED Notes (Signed)
Radiology in the room.

## 2014-05-30 NOTE — ED Notes (Signed)
PA form IR will be see patient in ED and explain possible procedure

## 2014-05-30 NOTE — ED Notes (Addendum)
Spoke with PA in IR. IR stated that they talked with Dr. Benay Spice and Dr. Benay Spice is suppose to talk with the family regarding placement of nephrostomy tube. Patient's family voicing anger that they have not been instructed about nephrostomy tube and if patient would be able to take care of it herself, etc. Charge nurse notified.

## 2014-05-30 NOTE — Progress Notes (Signed)
  CARE MANAGEMENT ED NOTE 05/30/2014  Patient:  Lori Dennis, Lori Dennis   Account Number:  [id number]  Date Initiated:  05/30/2014  Documentation initiated by:  Livia Snellen  Subjective/Objective Assessment:   Patient presents to Ed with fever, nausea vomotting.     Subjective/Objective Assessment Detail:   Patinet with pmhx of with metastatic carcinoid tumor, s/p colectomy with ileostomy on TPN, with double J stent due to tumor compression of the ureters     Action/Plan:   Action/Plan Detail:   Anticipated DC Date:       Status Recommendation to Physician:   Result of Recommendation:    Other ED Services  Consult Working [place]  CM consult  Other    Choice offered to / List presented to:            Status of service:  Completed, signed off  ED Comments:   ED Comments Detail:  EDCM spoke to patient aand her family at bedside. Patient's daughter [name] [phone] and Manon Banbury [phone].  Patient lives at home alone.  Patient's daughter confirms patient receives home health services with [facility] [place] visiting RN to change the needle in her port and blood work once a week.  Patient receives TPN at home.  Patient has a Gtube.  Patient's daughter reports the patient is able to perform all of her ADL's on her own without difficulty.  Patient does not have any medical equipment at home besides th epump for TPN. Patient and family without questions at this time.  No further EDCM needs at this time.

## 2014-05-30 NOTE — ED Notes (Signed)
Before coming back to a room patient used the bathroom already. She will call when she does have to go again.

## 2014-05-30 NOTE — Telephone Encounter (Signed)
   Provider input needed: Elevated temp, change in status   Reason for call: Daughter [name] arrived this morning to find her mom "shaking, sweating, burning up, coming in and out and yesterday her creatinine level was up to 4.6, she's thrown up a few times and stomach hurts.  Constitutional: positive for chills, fevers and sweats Gastrointestinal: positive for abdominal pain and vomiting Neurological: positive for "she responds to me but her eyes keep closing and opening.  Says she is coming in and out."   ALLERGIES:  has No Known Allergies.  Patient last received chemotherapy/ treatment on n/a  Patient was last seen in the office on 05-28-2014  Next appt is 06-25-2014  Is patient having fevers greater than 100.5?  yes, T = 100.5 at 10:10 am   Is patient having uncontrolled pain, or new pain? yes, "stomach pain"   Is patient having new back pain that changes with position (worsens or eases when laying down?)  no   Is patient able to eat and drink? yes, G-tube     Is patient able to pass stool without difficulty?   yes, "ostomy"     Is patient having uncontrolled nausea?  yes, she's thrown up a few times since last night.    Patient's daughter [name]  calls 05/30/2014 with complaint of  Constitutional: positive for chills, fevers and sweats Gastrointestinal: positive for vomiting Neurological: positive for "she responds to me but her eyes keep closing and opening.  Says she is coming in and out.  Is she toxic from her kidney function?  What do we need to do?"   Summary Based on the above information advised family to  Take patient to ER.  Will notify Dr. Benay Spice of this call for any further orders.  Family wouldl like Dr. Benay Spice to expedite this process.   Dennis, Lori Qin  05/30/2014, 10:10 AM   Background Info  Lori Dennis   DOB: [birth year]   MR#: [id number]   CSN#   [id number] 05/30/2014

## 2014-05-31 ENCOUNTER — Inpatient Hospital Stay (HOSPITAL_COMMUNITY): Payer: Medicare Other

## 2014-05-31 DIAGNOSIS — C801 Malignant (primary) neoplasm, unspecified: Secondary | ICD-10-CM

## 2014-05-31 DIAGNOSIS — N189 Chronic kidney disease, unspecified: Secondary | ICD-10-CM

## 2014-05-31 LAB — COMPREHENSIVE METABOLIC PANEL
ALK PHOS: 206 U/L — AB (ref 39–117)
ALT: 13 U/L (ref 0–35)
AST: 20 U/L (ref 0–37)
Albumin: 2 g/dL — ABNORMAL LOW (ref 3.5–5.2)
BILIRUBIN TOTAL: 0.2 mg/dL — AB (ref 0.3–1.2)
BUN: 60 mg/dL — ABNORMAL HIGH (ref 6–23)
CHLORIDE: 113 meq/L — AB (ref 96–112)
CO2: 20 meq/L (ref 19–32)
Calcium: 10.6 mg/dL — ABNORMAL HIGH (ref 8.4–10.5)
Creatinine, Ser: 4.78 mg/dL — ABNORMAL HIGH (ref 0.50–1.10)
GFR calc Af Amer: 10 mL/min — ABNORMAL LOW (ref >90)
GFR, EST NON AFRICAN AMERICAN: 9 mL/min — AB (ref >90)
Glucose, Bld: 117 mg/dL — ABNORMAL HIGH (ref 70–99)
Potassium: 3.9 mEq/L (ref 3.7–5.3)
Sodium: 146 mEq/L (ref 137–147)
Total Protein: 6.2 g/dL (ref 6.0–8.3)

## 2014-05-31 LAB — CBC
HEMATOCRIT: 20.5 % — AB (ref 36.0–46.0)
Hemoglobin: 6.6 g/dL — CL (ref 12.0–15.0)
MCH: 29.6 pg (ref 26.0–34.0)
MCHC: 32.2 g/dL (ref 30.0–36.0)
MCV: 91.9 fL (ref 78.0–100.0)
Platelets: 213 10*3/uL (ref 150–400)
RBC: 2.23 MIL/uL — AB (ref 3.87–5.11)
RDW: 16 % — ABNORMAL HIGH (ref 11.5–15.5)
WBC: 4.1 10*3/uL (ref 4.0–10.5)

## 2014-05-31 LAB — DIFFERENTIAL
Basophils Absolute: 0 10*3/uL (ref 0.0–0.1)
Basophils Relative: 0 % (ref 0–1)
EOS ABS: 0 10*3/uL (ref 0.0–0.7)
Eosinophils Relative: 1 % (ref 0–5)
Lymphocytes Relative: 7 % — ABNORMAL LOW (ref 12–46)
Lymphs Abs: 0.3 10*3/uL — ABNORMAL LOW (ref 0.7–4.0)
MONO ABS: 0.1 10*3/uL (ref 0.1–1.0)
Monocytes Relative: 2 % — ABNORMAL LOW (ref 3–12)
Neutro Abs: 3.7 10*3/uL (ref 1.7–7.7)
Neutrophils Relative %: 91 % — ABNORMAL HIGH (ref 43–77)

## 2014-05-31 LAB — URINE CULTURE
CULTURE: NO GROWTH
Colony Count: NO GROWTH

## 2014-05-31 LAB — APTT: APTT: 31 s (ref 24–37)

## 2014-05-31 LAB — MAGNESIUM: MAGNESIUM: 1.9 mg/dL (ref 1.5–2.5)

## 2014-05-31 LAB — PHOSPHORUS: Phosphorus: 4.9 mg/dL — ABNORMAL HIGH (ref 2.3–4.6)

## 2014-05-31 LAB — PROTIME-INR
INR: 1.01 (ref 0.00–1.49)
Prothrombin Time: 13.1 seconds (ref 11.6–15.2)

## 2014-05-31 LAB — PREPARE RBC (CROSSMATCH)

## 2014-05-31 LAB — TRIGLYCERIDES: Triglycerides: 109 mg/dL (ref <150)

## 2014-05-31 LAB — PREALBUMIN: Prealbumin: 20.2 mg/dL (ref 17.0–34.0)

## 2014-05-31 IMAGING — CR DG ABDOMEN 2V
2 series · 2 of 2 positions shown · non-contrast
Comparison: Abdomen and pelvis CT, 06/26/2012

CLINICAL DATA: Nausea and vomiting.  Assess for obstruction.

ABDOMEN - 2 VIEW

[w abdomen upright]
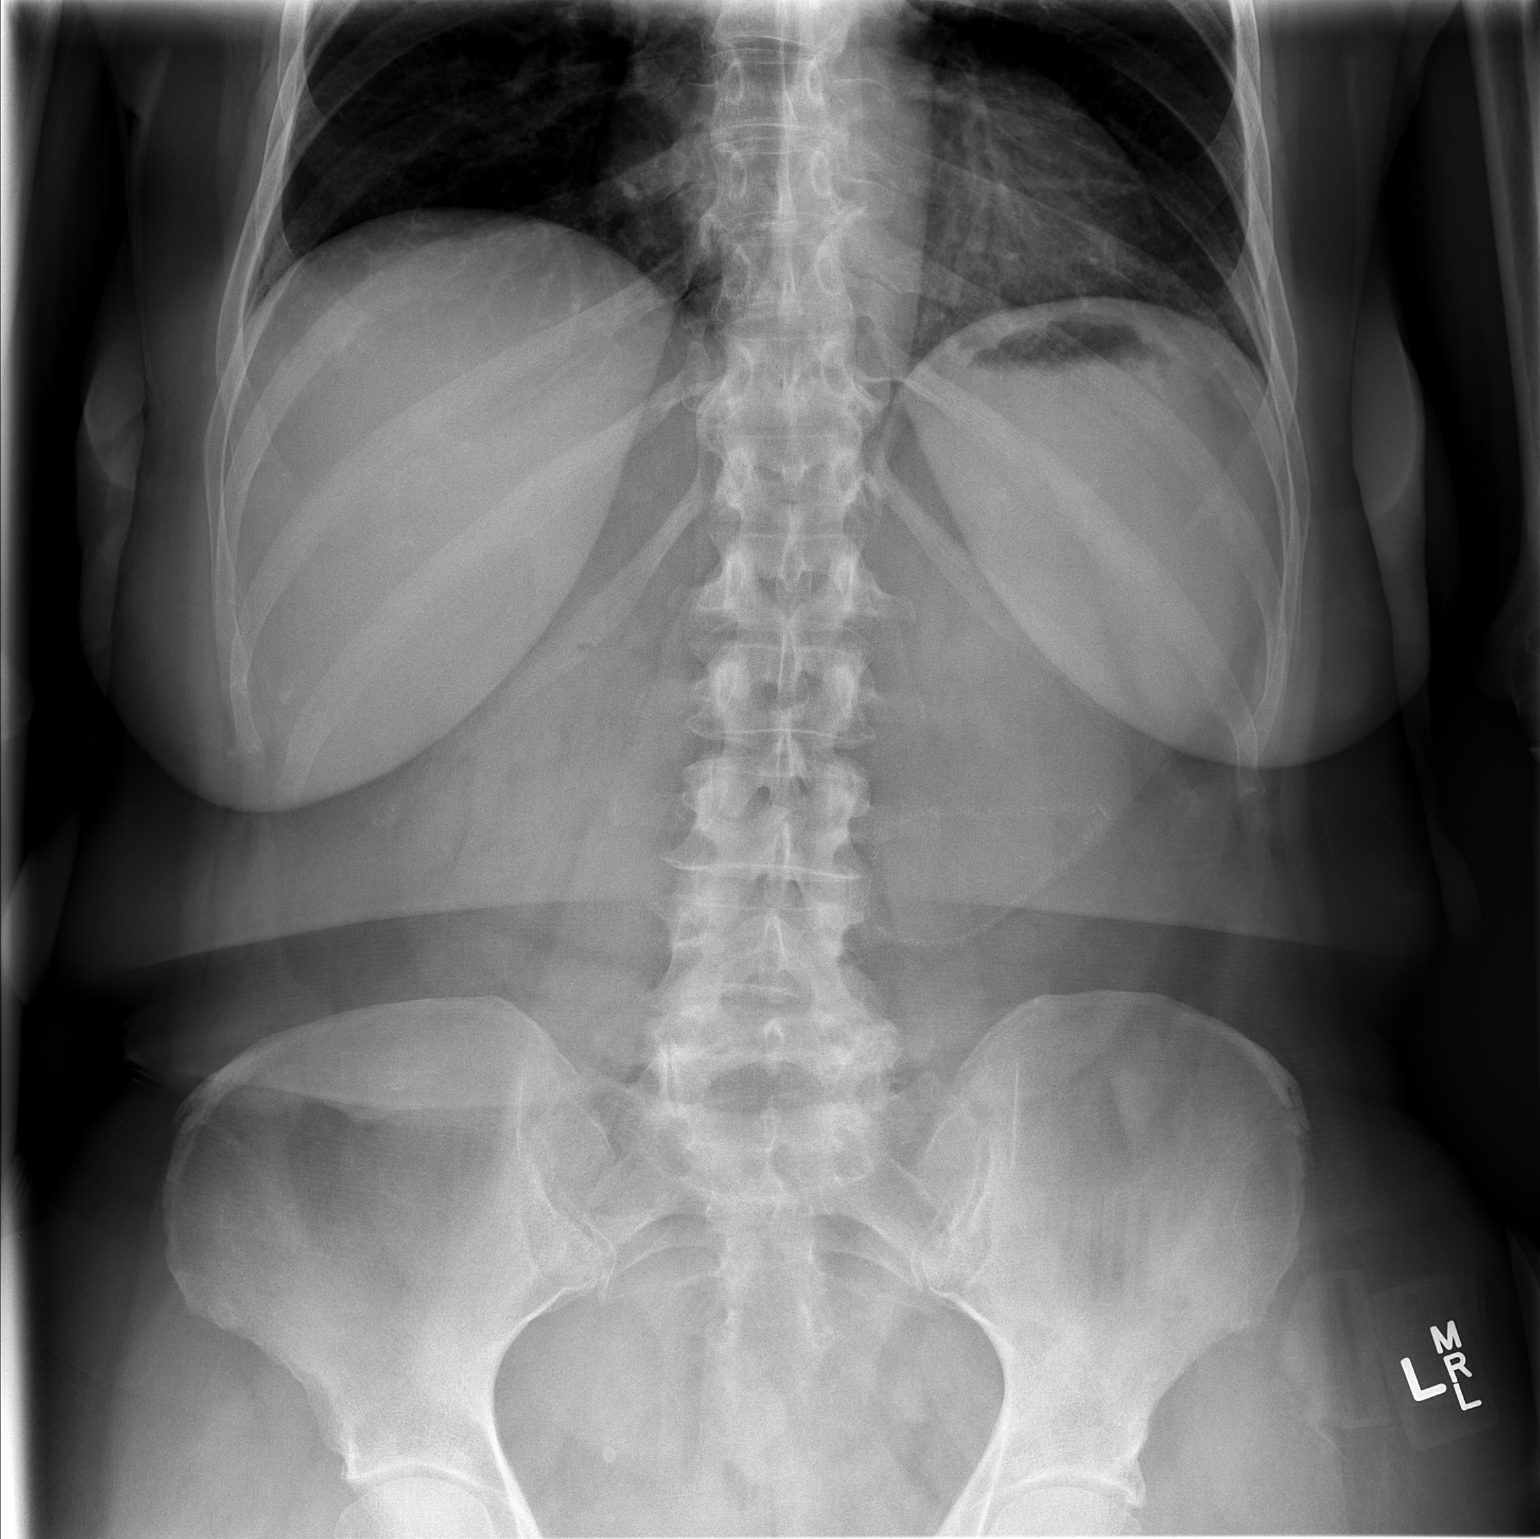

[t abdomen supine]
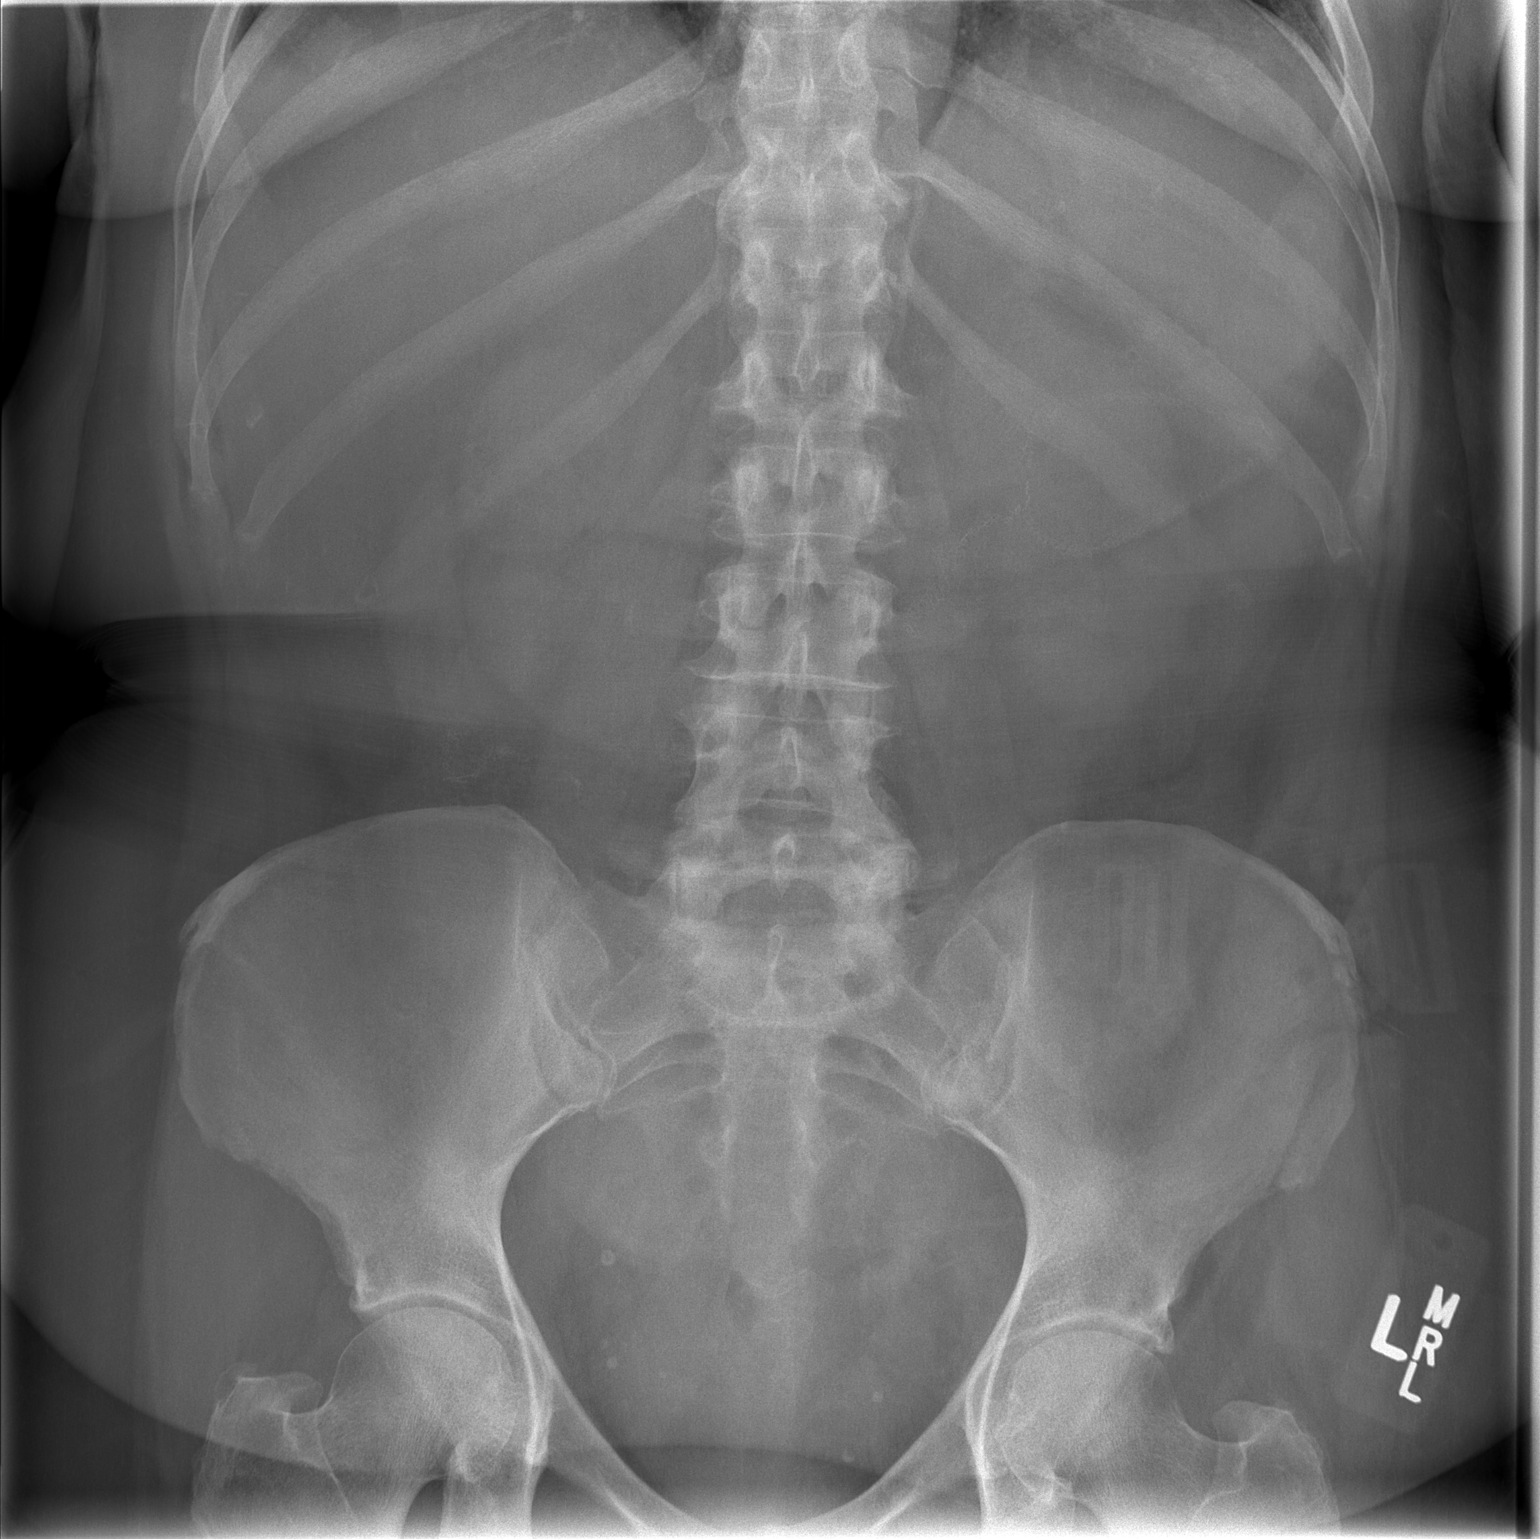

[2 of 2 positions shown; findings below may reference images not displayed]

FINDINGS: No bowel gas is evident.   There is no evidence of
obstruction.  The soft tissues are unremarkable.  No significant
bony abnormality.   There is no free air.
IMPRESSION: No bowel gas is seen.  This raises suspicion for a gastroenteritis
leading to fluid-filled bowel.  There is no evidence of obstruction
and no free air.

## 2014-05-31 MED ORDER — FENTANYL CITRATE 0.05 MG/ML IJ SOLN
INTRAMUSCULAR | Status: AC | PRN
  Administered 2014-05-31 (×2): 25 ug via INTRAVENOUS

## 2014-05-31 MED ORDER — FENTANYL CITRATE 0.05 MG/ML IJ SOLN
INTRAMUSCULAR | Status: AC
  Filled 2014-05-31: qty 6

## 2014-05-31 MED ORDER — LIDOCAINE HCL 1 % IJ SOLN
INTRAMUSCULAR | Status: AC
  Filled 2014-05-31: qty 20

## 2014-05-31 MED ORDER — MIDAZOLAM HCL 2 MG/2ML IJ SOLN
INTRAMUSCULAR | Status: AC
Start: 2014-05-31 — End: 2014-06-01
  Filled 2014-05-31: qty 6

## 2014-05-31 MED ORDER — FAT EMULSION 20 % IV EMUL
250.0000 mL | INTRAVENOUS | Status: AC
  Administered 2014-05-31: 250 mL via INTRAVENOUS
  Filled 2014-05-31: qty 250

## 2014-05-31 MED ORDER — TRACE MINERALS CR-CU-F-FE-I-MN-MO-SE-ZN IV SOLN
INTRAVENOUS | Status: AC
  Administered 2014-05-31: 19:00:00 via INTRAVENOUS
  Filled 2014-05-31: qty 1000

## 2014-05-31 MED ORDER — HYDRALAZINE HCL 20 MG/ML IJ SOLN
5.0000 mg | Freq: Four times a day (QID) | INTRAMUSCULAR | Status: DC | PRN
  Administered 2014-05-31 (×2): 5 mg via INTRAVENOUS
  Filled 2014-05-31 (×3): qty 0.25

## 2014-05-31 MED ORDER — CEFAZOLIN SODIUM-DEXTROSE 2-3 GM-% IV SOLR
2.0000 g | INTRAVENOUS | Status: AC
  Administered 2014-05-31: 2 g via INTRAVENOUS
  Filled 2014-05-31: qty 50

## 2014-05-31 MED ORDER — DEXTROSE-NACL 5-0.45 % IV SOLN
INTRAVENOUS | Status: DC
Start: 2014-05-31 — End: 2014-06-01
  Administered 2014-05-31 – 2014-06-01 (×2): via INTRAVENOUS

## 2014-05-31 MED ORDER — IOHEXOL 300 MG/ML  SOLN
10.0000 mL | Freq: Once | INTRAMUSCULAR | Status: AC | PRN
  Administered 2014-05-31: 10 mL

## 2014-05-31 MED ORDER — MIDAZOLAM HCL 2 MG/2ML IJ SOLN
INTRAMUSCULAR | Status: AC | PRN
  Administered 2014-05-31 (×2): 1 mg via INTRAVENOUS

## 2014-05-31 NOTE — Procedures (Signed)
Interventional Radiology Procedure Note  Procedure: Placement of a 76F LEFT percutaneous nephrostomy tube.  Complications: None Recommendations: - Tube to gravity - Monitor for signs of bleeding  Signed,  Criselda Peaches, MD [facility]

## 2014-05-31 NOTE — Progress Notes (Signed)
Patient ID: Lori Dennis, female   DOB: Oct 15, 1946, 68 y.o.   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 Recent imaging studies were reviewed by Dr. Laurence Ferrari. Despite bilateral ureteral stenting pt still has elevated creatinine, currently at  4.78. Additional PMH as below. Exam: pt awake/alert; chest- sl dim BS rt base, left clear; intact rt chest wall PAC; heart- RRR with some occ ectopy; abd- soft,+ BS , clean intact G tube ; intact LLQ colostomy; L>R LE edema.    Filed Vitals:   05/31/14 1100 05/31/14 1130 05/31/14 1215 05/31/14 1315  BP: 151/80 160/80 178/80 162/78  Pulse: 94 93 94 88  Temp: 99 F (37.2 C) 98.4 F (36.9 C) 98.4 F (36.9 C) 98.6 F (37 C)  TempSrc: Oral Oral Oral Oral  Resp: 16 16 16 16   Height:      Weight:      SpO2:       Past Medical History  Diagnosis Date  . Complication of anesthesia     slow to wake up  . Blood transfusion without reported diagnosis 2009 and 2011  . Hypertension     off all bp meds since Dec 20, 2012  . Chronic kidney disease 12-19-2012    elevated bun and creatinine   . Cancer 02/19/2008    Metastatic carcinoid tumor   Past Surgical History  Procedure Laterality Date  . Bowel resection  06/08/2010    ileocolonic resection, enteroenterostomy  . Left colectomy  04/02/2008    Exploratory laparotomy, biopsy of omental nodule, sigmoid colectomy  . Vaginal hysterectomy      at age 36  . Cholecystectomy      age 28  . Portacath placement Right 02/06/2013    Procedure: Ultrasound guided PORT-A-CATH INSERTION with flouro;  Surgeon: [name], MD;   Location: [facility];  Service: General;  Laterality: Right;  . Esophagogastroduodenoscopy N/A 06/28/2013    Procedure: ESOPHAGOGASTRODUODENOSCOPY (EGD);  Surgeon: Winfield Cunas., MD;  Location: [facility];  Service: Endoscopy;  Laterality: N/A;  . Cystoscopy with retrograde pyelogram, ureteroscopy and stent placement Bilateral 04/08/2014    Procedure: CYSTOSCOPY WITH BILATERAL  RETROGRADE PYELOGRAM,  AND  BILATERAL  STENT PLACEMENT;  Surgeon: Irine Seal, MD;  Location: [facility];  Service: Urology;  Laterality: Bilateral;  . Cystoscopy with retrograde pyelogram, ureteroscopy and stent placement Bilateral 05/03/2014    Procedure: CYSTOSCOPY WITH BILATERAL DOUBLE J STENT EXCHANGE, LEFT URETEROSCOPY;  Surgeon: Bernestine Amass, MD;  Location: [facility];  Service: Urology;  Laterality: Bilateral;   Ct Abdomen Pelvis Wo Contrast  05/30/2014   CLINICAL DATA:  Fever with nausea and vomiting. Metastatic carcinoid. Small bowel obstruction. Renal stents. Ultrasound yesterday showed renal obstruction.  EXAM: CT ABDOMEN AND PELVIS WITHOUT CONTRAST  TECHNIQUE: Multidetector CT imaging of the abdomen and pelvis was performed following the standard protocol without IV contrast.  COMPARISON:  04/08/2014 CT.  FINDINGS: Bones: No aggressive osseous lesions are identified. Lower lumbar spondylosis and facet arthrosis.  Lung Bases: Small right pleural effusion layering dependently with associated compressive atelectasis. Collapse/ consolidation over the bases is most likely atelectasis. There is a tiny left pleural effusion also with some atelectasis. Tiny peripheral pulmonary nodule in the lateral left lower  lobe (image 8 series 4).  Diaphragmatic lymph node is present (image 18 series 2) measuring 8 mm short axis, slightly larger than on the prior exam.  Liver: High density is present along the liver surface, compatible with peritoneal carcinomatosis and implantation along the liver capsule. Unenhanced CT was performed per  clinician order. Lack of IV contrast limits sensitivity and specificity, especially for evaluation of abdominal/pelvic solid viscera. No definite intrahepatic mass is present.  Spleen:  Normal.  Gallbladder:  No calcified stones.  Partially contracted.  Common bile duct:  Normal.  Pancreas:  Normal.  Adrenal glands: Mild thickening of the left adrenal gland is stable compared to prior. No discrete mass lesion.  Kidneys: Bilateral double-J ureteral stents are present. The proximal loops or appropriately of reconstituted in the renal pelvis. Distal loops are reconstituted in the urinary bladder. There is persistent bilateral hydronephrosis. This was identified on ultrasound yesterday. This may represent stent dysfunction or residual hydronephrosis following stent placement.  Stomach: Gastrostomy tube is present which appears in good position. Gastrojejunostomy present with staples along the inferior stomach. No definite acute gastric findings.  Small bowel: Small bowel is decompressed. No findings to suggest small bowel obstruction. In the left anatomic pelvis, there is an error in fluid containing structure which appears to represent a loop of small bowel. This is near the colostomy or MRI ostomy E in the left lower quadrant (image 47 series 2). This was present on the prior exam of 04/08/2014 but not as distended.  This is suboptimally evaluated in the absence of oral contrast. This appears confluent with the pelvic mass that measures slightly larger than on the prior exam, 7.4 x 6.7 cm.  Colon: Left colectomy. It is difficult to distinguish between colon and small bowel in the absence of contrast. The ostomy in the left lower quadrant appears to represent a end colostomy.  Pelvic Genitourinary: Urinary bladder is decompressed. Double-J ureteral stents are reconstituted within the urinary bladder.  Vasculature: Mild atherosclerosis. No acute vascular abnormality on noncontrast CT.  Body Wall: Scarring along the  anterior abdominal wall.  IMPRESSION: 1. Small right and trace left pleural effusions. Associated atelectasis. 2. 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 3. No convincing evidence of small bowel obstruction. There is a mildly dilated loop of what bowel in the left lower quadrant that is slightly larger than on prior exam. This is suboptimally evaluated in the absence of oral contrast. If further evaluation is warranted, administration of oral contrast and repeat CT scanning would offer better evaluation. 4. Bilateral hydronephrosis with good position of double-J ureteral stent. Persistent hydronephrosis may be residual following decompression or represent stent dysfunction. 5. Increasing size of mass in the anatomic pelvis compatible with carcinoid tumor.   Electronically Signed   By: Dereck Ligas M.D.   On: 05/30/2014 13:00   US Renal  05/29/2014   CLINICAL DATA:  Acute and chronic renal insufficiency. ; history bilateral renal stents  EXAM: RENAL/URINARY TRACT ULTRASOUND COMPLETE  COMPARISON:  Renal ultrasound of May 03, 2014  FINDINGS: Right Kidney:  Length: 10.8 cm. There is moderate to severe persistent hydronephrosis. A stent is present. There is no focal mass.  Left Kidney:  Length: 11.6 cm. There is mild to moderate persistent hydronephrosis. A stent is present.  Bladder:  Appears normal for degree of bladder distention. Echogenic stents are visible.  IMPRESSION: There remains bilateral hydronephrosis greater on the right than on the left little changed since the previous study.  Stents are present bilaterally.   Electronically Signed   By: David  Martinique   On: 05/29/2014 08:25   US Renal  05/03/2014   CLINICAL DATA:  Acute on chronic renal failure. History of bilateral ureteral stents.  EXAM: RENAL/URINARY TRACT ULTRASOUND COMPLETE  COMPARISON:  CT, 04/08/2014  FINDINGS: Right Kidney:  Length: 12.7 cm. Moderate hydronephrosis. Stent curls within the renal  pelvis. Normal parenchymal echogenicity. No mass or stone.  Left Kidney:  Length: 12.3 cm. Mild to moderate hydronephrosis. Stent curls in the renal pelvis. No mass or stone.  Bladder:  Decompressed.  Distal aspects of both stents were visualized.  IMPRESSION: 1. Moderate right and mild to moderate left hydronephrosis despite the presence of bilateral ureteral stents. Severity of the hydronephrosis, however, has improved, more notably on the left. 2. No other abnormalities.   Electronically Signed   By: Lajean Manes M.D.   On: 05/03/2014 09:57   Dg Chest Port 1 View  05/30/2014   CLINICAL DATA:  Metastatic abdominal carcinoma.  Hypertension.  EXAM: PORTABLE CHEST - 1 VIEW  COMPARISON:  02/06/2013  FINDINGS: Heart, mediastinum hila are unremarkable. Right anterior chest wall power Port-A-Cath has its catheter tip in the lower superior vena cava, stable.  There is blunting of the right hemidiaphragm, new from the prior study. This is likely due to a small effusion with atelectasis. No left pleural effusion. Lungs are otherwise clear. No pneumothorax.  Bony thorax is demineralized but grossly intact.  IMPRESSION: 1. No acute cardiopulmonary disease. 2. Small pleural effusion with atelectasis.   Electronically Signed   By: Lajean Manes M.D.   On: 05/30/2014 11:40   Dg Abd 2 Views  05/30/2014   CLINICAL DATA:  Metastatic abdominal carcinoid.  EXAM: ABDOMEN - 2 VIEW  COMPARISON:  05/30/2014 CT.  04/08/2014 CT.  FINDINGS: Percutaneous gastrostomy and bilateral double-J ureteral stents are present. There is tortuosity of the right double-J ureteral stent. The bowel gas pattern is nonobstructive. Bowel gas is present within the rectum. The abdomen is relatively gasless. There is no plain film evidence of free air. Right-sided decubitus was obtained.  IMPRESSION: 1. Nonobstructive bowel gas pattern. 2. Bilateral double-J ureteral stents and percutaneous gastrostomy. 3. No acute abnormality on plain film.    Electronically Signed   By: Dereck Ligas M.D.   On: 05/30/2014 12:25  Results for orders placed during the hospital encounter of 05/30/14  CULTURE, BLOOD (ROUTINE X 2)      Result Value Ref Range   Specimen Description BLOOD PORT     Special Requests BOTTLES DRAWN AEROBIC AND ANAEROBIC 3ML     Culture  Setup Time       Value: 05/30/2014 14:32     Performed at [facility]   Culture       Value:        BLOOD CULTURE RECEIVED NO GROWTH TO DATE CULTURE WILL BE HELD FOR 5 DAYS BEFORE ISSUING A FINAL NEGATIVE REPORT     Performed at [facility]   Report Status PENDING    CULTURE, BLOOD (ROUTINE X 2)      Result Value Ref Range   Specimen Description BLOOD L HAND     Special Requests BOTTLES DRAWN AEROBIC AND ANAEROBIC 5ML     Culture  Setup Time       Value: 05/30/2014 14:35     Performed at [facility]   Culture       Value:  BLOOD CULTURE RECEIVED NO GROWTH TO DATE CULTURE WILL BE HELD FOR 5 DAYS BEFORE ISSUING A FINAL NEGATIVE REPORT     Performed at [facility]   Report Status PENDING    URINE CULTURE      Result Value Ref Range   Specimen Description URINE, CATHETERIZED     Special Requests NONE     Culture  Setup Time       Value: 05/30/2014 15:38     Performed at [facility] Count       Value: NO GROWTH     Performed at [facility]   Culture       Value: NO GROWTH     Performed at [facility]   Report Status 05/31/2014 FINAL    CBC WITH DIFFERENTIAL      Result Value Ref Range   WBC 6.5  4.0 - 10.5 K/uL   RBC 2.67 (*) 3.87 - 5.11 MIL/uL   Hemoglobin 7.7 (*) 12.0 - 15.0 g/dL   HCT 23.8 (*) 36.0 - 46.0 %   MCV 89.1  78.0 - 100.0 fL   MCH 28.8  26.0 - 34.0 pg   MCHC 32.4  30.0 - 36.0 g/dL   RDW 15.4  11.5 - 15.5 %   Platelets 324  150 - 400 K/uL   Neutrophils Relative % 94 (*) 43 - 77 %   Neutro Abs 6.0  1.7 - 7.7 K/uL   Lymphocytes Relative 4 (*) 12 - 46 %   Lymphs Abs 0.3 (*) 0.7 - 4.0  K/uL   Monocytes Relative 2 (*) 3 - 12 %   Monocytes Absolute 0.2  0.1 - 1.0 K/uL   Eosinophils Relative 0  0 - 5 %   Eosinophils Absolute 0.0  0.0 - 0.7 K/uL   Basophils Relative 0  0 - 1 %   Basophils Absolute 0.0  0.0 - 0.1 K/uL  COMPREHENSIVE METABOLIC PANEL      Result Value Ref Range   Sodium 147  137 - 147 mEq/L   Potassium 4.4  3.7 - 5.3 mEq/L   Chloride 110  96 - 112 mEq/L   CO2 19  19 - 32 mEq/L   Glucose, Bld 125 (*) 70 - 99 mg/dL   BUN 66 (*) 6 - 23 mg/dL   Creatinine, Ser 4.60 (*) 0.50 - 1.10 mg/dL   Calcium 11.8 (*) 8.4 - 10.5 mg/dL   Total Protein 6.9  6.0 - 8.3 g/dL   Albumin 2.3 (*) 3.5 - 5.2 g/dL   AST 19  0 - 37 U/L   ALT 13  0 - 35 U/L   Alkaline Phosphatase 222 (*) 39 - 117 U/L   Total Bilirubin 0.4  0.3 - 1.2 mg/dL   GFR calc non Af Amer 9 (*) >90 mL/min   GFR calc Af Amer 10 (*) >90 mL/min  URINALYSIS, ROUTINE W REFLEX MICROSCOPIC      Result Value Ref Range   Color, Urine YELLOW  YELLOW   APPearance CLEAR  CLEAR   Specific Gravity, Urine 1.007  1.005 - 1.030   pH 6.0  5.0 - 8.0   Glucose, UA NEGATIVE  NEGATIVE mg/dL   Hgb urine dipstick SMALL (*) NEGATIVE   Bilirubin Urine NEGATIVE  NEGATIVE   Ketones, ur NEGATIVE  NEGATIVE mg/dL   Protein, ur NEGATIVE  NEGATIVE mg/dL   Urobilinogen, UA 0.2  0.0 - 1.0 mg/dL   Nitrite NEGATIVE  NEGATIVE  Leukocytes, UA NEGATIVE  NEGATIVE  LIPASE, BLOOD      Result Value Ref Range   Lipase 59  11 - 59 U/L  TROPONIN I      Result Value Ref Range   Troponin I <0.30  <0.30 ng/mL  URINE MICROSCOPIC-ADD ON      Result Value Ref Range   WBC, UA 0-2  <3 WBC/hpf   RBC / HPF 7-10  <3 RBC/hpf  MAGNESIUM      Result Value Ref Range   Magnesium 2.2  1.5 - 2.5 mg/dL  PHOSPHORUS      Result Value Ref Range   Phosphorus 5.0 (*) 2.3 - 4.6 mg/dL  COMPREHENSIVE METABOLIC PANEL      Result Value Ref Range   Sodium 146  137 - 147 mEq/L   Potassium 3.9  3.7 - 5.3 mEq/L   Chloride 113 (*) 96 - 112 mEq/L   CO2 20  19 - 32  mEq/L   Glucose, Bld 117 (*) 70 - 99 mg/dL   BUN 60 (*) 6 - 23 mg/dL   Creatinine, Ser 4.78 (*) 0.50 - 1.10 mg/dL   Calcium 10.6 (*) 8.4 - 10.5 mg/dL   Total Protein 6.2  6.0 - 8.3 g/dL   Albumin 2.0 (*) 3.5 - 5.2 g/dL   AST 20  0 - 37 U/L   ALT 13  0 - 35 U/L   Alkaline Phosphatase 206 (*) 39 - 117 U/L   Total Bilirubin 0.2 (*) 0.3 - 1.2 mg/dL   GFR calc non Af Amer 9 (*) >90 mL/min   GFR calc Af Amer 10 (*) >90 mL/min  PREALBUMIN      Result Value Ref Range   Prealbumin 20.2  17.0 - 34.0 mg/dL  MAGNESIUM      Result Value Ref Range   Magnesium 1.9  1.5 - 2.5 mg/dL  PHOSPHORUS      Result Value Ref Range   Phosphorus 4.9 (*) 2.3 - 4.6 mg/dL  TRIGLYCERIDES      Result Value Ref Range   Triglycerides 109  <150 mg/dL  CBC      Result Value Ref Range   WBC 4.1  4.0 - 10.5 K/uL   RBC 2.23 (*) 3.87 - 5.11 MIL/uL   Hemoglobin 6.6 (*) 12.0 - 15.0 g/dL   HCT 20.5 (*) 36.0 - 46.0 %   MCV 91.9  78.0 - 100.0 fL   MCH 29.6  26.0 - 34.0 pg   MCHC 32.2  30.0 - 36.0 g/dL   RDW 16.0 (*) 11.5 - 15.5 %   Platelets 213  150 - 400 K/uL  DIFFERENTIAL      Result Value Ref Range   Neutrophils Relative % 91 (*) 43 - 77 %   Neutro Abs 3.7  1.7 - 7.7 K/uL   Lymphocytes Relative 7 (*) 12 - 46 %   Lymphs Abs 0.3 (*) 0.7 - 4.0 K/uL   Monocytes Relative 2 (*) 3 - 12 %   Monocytes Absolute 0.1  0.1 - 1.0 K/uL   Eosinophils Relative 1  0 - 5 %   Eosinophils Absolute 0.0  0.0 - 0.7 K/uL   Basophils Relative 0  0 - 1 %   Basophils Absolute 0.0  0.0 - 0.1 K/uL  PROTIME-INR      Result Value Ref Range   Prothrombin Time 13.1  11.6 - 15.2 seconds   INR 1.01  0.00 - 1.49  APTT  Result Value Ref Range   aPTT 31  24 - 37 seconds  I-STAT CG4 LACTIC ACID, ED      Result Value Ref Range   Lactic Acid, Venous 1.72  0.5 - 2.2 mmol/L  TYPE AND SCREEN      Result Value Ref Range   ABO/RH(D) A POS     Antibody Screen NEG     Sample Expiration 06/03/2014     Unit Number [id number]     Blood  Component Type RED CELLS,LR     Unit division 00     Status of Unit ISSUED     Transfusion Status OK TO TRANSFUSE     Crossmatch Result Compatible     Unit Number [id number]     Blood Component Type RED CELLS,LR     Unit division 00     Status of Unit ALLOCATED     Transfusion Status OK TO TRANSFUSE     Crossmatch Result Compatible    PREPARE RBC (CROSSMATCH)      Result Value Ref Range   Order Confirmation ORDER PROCESSED BY BLOOD BANK     A/P: Pt with history of renal failure, bilateral hydronephrosis/ureteral stents, metastatic carcinoid tumor with carcinomatosis prompting multiple bowel resection procedures, status post a palliative sigmoid colectomy/colostomy, right colectomy, and gastrojejunostomy in April 2009, status post placement of a palliative gastrostomy tube 07/24/2013 . Recent imaging studies were reviewed by Dr. Laurence Ferrari. Despite bilateral ureteral stenting pt still has elevated creatinine, currently at 4.78. Tent plan is for placement of a left percutaneous nephrostomy tube today to decompress collecting system and hopefully improve renal function. Initially only left side will be performed but if necessary a right PCN could be placed later. Details/risks of procedure d/w pt/family with their understanding and consent. Pt has received transfusion today secondary to hgb 6.6.

## 2014-05-31 NOTE — Progress Notes (Addendum)
Pharmacy Brief Note: TNA  Resume TNA per Renal service: No electrolyte formula  Patient on home cyclic TNA PTA for bowel obstruction. Hx of chronic renal failure, lytes elevated  Begin Clinimix 5/15 NO Electrolyte formula at 40 ml/hr today at 1800. Monitor lytes, renal function, urine output Reduce maintenance rate to 35 ml/hr  Thank you, Minda Ditto PharmD Pager [phone] 05/31/2014, 3:12 PM

## 2014-05-31 NOTE — Progress Notes (Addendum)
IP PROGRESS NOTE  Subjective:   No new complaint.  Objective: Vital signs in last 24 hours: Blood pressure 176/83, pulse 94, temperature 98.8 F (37.1 C), temperature source Oral, resp. rate 16, height 5' 3.5" (1.613 m), weight 135 lb 9.3 oz (61.5 kg), SpO2 99.00%.  Intake/Output from previous day: 06/11 0701 - 06/12 0700 In: 1676.1 [I.V.:1513.3; TPN:112.8] Out: 870 [Drains:850; Stool:20]  Physical Exam:  Lungs: Clear bilaterally Cardiac: Regular rate and rhythm Abdomen: Left lower quadrant colostomy, left upper quadrant gastrostomy tube, tender in the right lower abdomen Extremities: 1+ edema in the left lower leg   Portacath/PICC-without erythema  Lab Results:  Recent Labs  05/30/14 1145 05/31/14 0501  WBC 6.5 4.1  HGB 7.7* 6.6*  HCT 23.8* 20.5*  PLT 324 213    BMET  Recent Labs  05/30/14 1145 05/31/14 0501  NA 147 146  K 4.4 3.9  CL 110 113*  CO2 19 20  GLUCOSE 125* 117*  BUN 66* 60*  CREATININE 4.60* 4.78*  CALCIUM 11.8* 10.6*    Studies/Results: Ct Abdomen Pelvis Wo Contrast  05/30/2014   CLINICAL DATA:  Fever with nausea and vomiting. Metastatic carcinoid. Small bowel obstruction. Renal stents. Ultrasound yesterday showed renal obstruction.  EXAM: CT ABDOMEN AND PELVIS WITHOUT CONTRAST  TECHNIQUE: Multidetector CT imaging of the abdomen and pelvis was performed following the standard protocol without IV contrast.  COMPARISON:  04/08/2014 CT.  FINDINGS: Bones: No aggressive osseous lesions are identified. Lower lumbar spondylosis and facet arthrosis.  Lung Bases: Small right pleural effusion layering dependently with associated compressive atelectasis. Collapse/ consolidation over the bases is most likely atelectasis. There is a tiny left pleural effusion also with some atelectasis. Tiny peripheral pulmonary nodule in the lateral left lower lobe (image 8 series 4).  Diaphragmatic lymph node is present (image 18 series 2) measuring 8 mm short axis, slightly  larger than on the prior exam.  Liver: High density is present along the liver surface, compatible with peritoneal carcinomatosis and implantation along the liver capsule. Unenhanced CT was performed per clinician order. Lack of IV contrast limits sensitivity and specificity, especially for evaluation of abdominal/pelvic solid viscera. No definite intrahepatic mass is present.  Spleen:  Normal.  Gallbladder:  No calcified stones.  Partially contracted.  Common bile duct:  Normal.  Pancreas:  Normal.  Adrenal glands: Mild thickening of the left adrenal gland is stable compared to prior. No discrete mass lesion.  Kidneys: Bilateral double-J ureteral stents are present. The proximal loops or appropriately of reconstituted in the renal pelvis. Distal loops are reconstituted in the urinary bladder. There is persistent bilateral hydronephrosis. This was identified on ultrasound yesterday. This may represent stent dysfunction or residual hydronephrosis following stent placement.  Stomach: Gastrostomy tube is present which appears in good position. Gastrojejunostomy present with staples along the inferior stomach. No definite acute gastric findings.  Small bowel: Small bowel is decompressed. No findings to suggest small bowel obstruction. In the left anatomic pelvis, there is an error in fluid containing structure which appears to represent a loop of small bowel. This is near the colostomy or MRI ostomy E in the left lower quadrant (image 47 series 2). This was present on the prior exam of 04/08/2014 but not as distended.  This is suboptimally evaluated in the absence of oral contrast. This appears confluent with the pelvic mass that measures slightly larger than on the prior exam, 7.4 x 6.7 cm.  Colon: Left colectomy. It is difficult to distinguish between colon and  small bowel in the absence of contrast. The ostomy in the left lower quadrant appears to represent a end colostomy.  Pelvic Genitourinary: Urinary bladder is  decompressed. Double-J ureteral stents are reconstituted within the urinary bladder.  Vasculature: Mild atherosclerosis. No acute vascular abnormality on noncontrast CT.  Body Wall: Scarring along the anterior abdominal wall.  IMPRESSION: 1. Small right and trace left pleural effusions. Associated atelectasis. 2. 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 3. No convincing evidence of small bowel obstruction. There is a mildly dilated loop of what bowel in the left lower quadrant that is slightly larger than on prior exam. This is suboptimally evaluated in the absence of oral contrast. If further evaluation is warranted, administration of oral contrast and repeat CT scanning would offer better evaluation. 4. Bilateral hydronephrosis with good position of double-J ureteral stent. Persistent hydronephrosis may be residual following decompression or represent stent dysfunction. 5. Increasing size of mass in the anatomic pelvis compatible with carcinoid tumor.   Electronically Signed   By: Dereck Ligas M.D.   On: 05/30/2014 13:00   Dg Chest Port 1 View  05/30/2014   CLINICAL DATA:  Metastatic abdominal carcinoma.  Hypertension.  EXAM: PORTABLE CHEST - 1 VIEW  COMPARISON:  02/06/2013  FINDINGS: Heart, mediastinum hila are unremarkable. Right anterior chest wall power Port-A-Cath has its catheter tip in the lower superior vena cava, stable.  There is blunting of the right hemidiaphragm, new from the prior study. This is likely due to a small effusion with atelectasis. No left pleural effusion. Lungs are otherwise clear. No pneumothorax.  Bony thorax is demineralized but grossly intact.  IMPRESSION: 1. No acute cardiopulmonary disease. 2. Small pleural effusion with atelectasis.   Electronically Signed   By: Lajean Manes M.D.   On: 05/30/2014 11:40   Dg Abd 2 Views  05/30/2014   CLINICAL DATA:  Metastatic abdominal carcinoid.  EXAM: ABDOMEN - 2 VIEW  COMPARISON:  05/30/2014 CT.   04/08/2014 CT.  FINDINGS: Percutaneous gastrostomy and bilateral double-J ureteral stents are present. There is tortuosity of the right double-J ureteral stent. The bowel gas pattern is nonobstructive. Bowel gas is present within the rectum. The abdomen is relatively gasless. There is no plain film evidence of free air. Right-sided decubitus was obtained.  IMPRESSION: 1. Nonobstructive bowel gas pattern. 2. Bilateral double-J ureteral stents and percutaneous gastrostomy. 3. No acute abnormality on plain film.   Electronically Signed   By: Dereck Ligas M.D.   On: 05/30/2014 12:25    Medications: I have reviewed the patient's current medications.  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 Acute renal failure secondary to an obstructing pelvic mass and a left ureter stone, status post placement of bilateral ureter stents 04/08/2014  Recurrent acute renal failure 05/02/2014  Exchange of bilateral ureter stents 05/03/2014, bilateral 7 French stent  Persistent bilateral hydronephrosis on a renal ultrasound 05/29/2014 and an abdominal CT 05/30/2014 3. history of Gross hematuria-likely secondary to the left ureter stone  4. Chronic left leg edema-negative Doppler 11/03/2012 and 05/03/2014  5. Nutrition-maintained on home TNA  6. Right scalp herpes zoster rash July 2013  7. Pain secondary to progressive carcinomatosis and urinary obstruction-she continues oxycodone as needed  8. anemia secondary to chronic disease, GU blood loss, and renal failure  9. Elevated calcium-potentially related  to renal failure/TNA versus hypercalcemia malignancy. She received pamidronate 05/29/2014. Improved today. 10. Hypertension -likely secondary to obstructive nephropathy     (see discussion from 05/30/2012)  I had further discussions with Ms. Heney and her family this morning. The renal function is unchanged. She appears less anxious. She remains undecided on the left PCN. She would like to meet with nephrology and I are prior to making a final decision. She will likely remain in the hospital for a few days if the nephrostomy tube is placed. If she decides against the nephrostomy tube I recommend home hospice care.  She agrees to a red cell transfusion for palliation of malaise. We will consider outpatient erythropoietin therapy if a nephrostomy tube is placed and aggressive care is continued.  Please call oncology as needed over the weekend. I will check on her 06/03/2014.  LOS: 1 day   [place]  05/31/2014, 9:04 AM  I stop the Lovenox secondary to the renal failure, potential plan for a nephrostomy tube, and history of recent gross hematuria. I added SCDs.

## 2014-05-31 NOTE — Progress Notes (Signed)
CRITICAL VALUE ALERT  Critical value received: Hemoglobin 6.6  Date of notification:  05/31/14  Time of notification:  0525  Critical value read back:yes  Nurse who received alert:  Noreene Larsson RN  MD notified (1st page):  Kathline Magic NP  Time of first page:  [phone]  Responding MD:  Kathline Magic NP  Time MD responded:  [phone]

## 2014-05-31 NOTE — Progress Notes (Addendum)
Patient ID: Lori Dennis, female   DOB: [birth year], 68 y.o.   MRN: [id number]  [facility] PROGRESS NOTE  AVYANNA SPADA NOI:[id number] DOB: 1946/10/18 DOA: 05/30/2014 PCP: Irven Shelling, MD  Brief narrative: Pt is 68 yo female with metastatic carcinoid tumor, s/p colectomy with ileostomy on TPN, with double J stent due to tumor compression of the ureters presented to [facility] ED with main concern of several days duration of progressively worsening weakness, generalized abdominal pain, 8/10 in severity and radiating to bilateral flank areas, no specific aggravating or alleviating factors, associated with poor oral intake, fevers, nausea and non bloody vomiting.  Her oncologist and urologist feel that the stents are not working appropriately and she needs nephrostomy tubes placed.   Principal Problem:   Carcinoid tumor of intestine - management per oncology team, assistance greatly appreciated Active Problems:   Acute on chronic renal failure, stage III - IV - chronic bilateral hydro, Cr unchanged over the past 24 hours - per Dr. Benay Spice, consult nephrology for further input, appreciate assistance    Acute on chronic blood loss anemia - plan on transfusing two units of PRBC this AM - repeat CBC in AM - SCD's for DVT prophylaxis    HTN (hypertension) - reasonable inpatient control    Nausea & vomiting - probably related to metastatic carcinoid tumor - continue supportive care with antiemetics and analgesia as needed   Severe malnutrition - secondary to progressive nature of the tumor - on TNA    Functional quadriplegia - secondary to deconditioning and FTT - PT evaluation once pt able to tolerate    Hypercalcemia - slowly trending down, repeat BMP in AM   Fever - possibly related to malignancy, no clear source identified - will hold off on ABX for now    Consultants:  Oncology Dr. Benay Spice  Nephrology   Procedures/Studies: Ct Abdomen Pelvis Wo Contrast   05/30/2014 Small right and trace left pleural effusions. Associated atelectasis.  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 No convincing evidence of small bowel obstruction. There is a mildly dilated loop of what bowel in the left lower quadrant that is slightly larger than on prior exam. This is suboptimally evaluated in the absence of oral contrast. If further evaluation is warranted, administration of oral contrast and repeat CT scanning would offer better evaluation. Bilateral hydronephrosis with good position of double-J ureteral stent. Persistent hydronephrosis may be residual following decompression or represent stent dysfunction. Increasing size of mass in the anatomic pelvis compatible with carcinoid tumor.  Dg Chest Port 1 View  05/30/2014   No acute cardiopulmonary disease. Small pleural effusion with atelectasis. Dg Abd 2 Views  05/30/2014  Nonobstructive bowel gas pattern. Bilateral double-J ureteral stents and percutaneous gastrostomy. No acute abnormality on plain film.     Antibiotics:  None   Code Status: DNR Family Communication: Pt and multiple family members (4) at bedside Disposition Plan: Remains inpatient   HPI/Subjective: No events overnight.   Objective: Filed Vitals:   05/30/14 1935 05/30/14 2135 05/31/14 0452 05/31/14 1100  BP: 192/84 189/83 176/83 151/80  Pulse: 92 92 94 94  Temp: 98.3 F (36.8 C) 98.9 F (37.2 C) 98.8 F (37.1 C) 99 F (37.2 C)  TempSrc: Oral Oral Oral Oral  Resp:  16 16 16   Height: 5' 3.5" (1.613 m)     Weight: 61.5 kg (135 lb 9.3 oz)     SpO2: 100% 96% 99%     Intake/Output Summary (Last 24  hours) at 05/31/14 1134 Last data filed at 05/31/14 0177  Gross per 24 hour  Intake 1676.13 ml  Output   1170 ml  Net 506.13 ml    Exam:   General:  Pt is alert, follows commands appropriately, not in acute distress, frail and chronically ill appearing   Cardiovascular: Regular rate and rhythm, no rubs,  no gallops  Respiratory: Clear to auscultation bilaterally, no wheezing, diminished breath sounds at bases   Abdomen: Soft, tender mostly in lower abdominal quadrants, non distended  Extremities: pulses DP and PT palpable bilaterally  Neuro: Grossly nonfocal  Data Reviewed: Basic Metabolic Panel:  Recent Labs Lab 05/28/14 0931 05/29/14 0900 05/30/14 1145 05/31/14 0501  NA 146* 142 147 146  K 4.5 4.4 4.4 3.9  CL  --   --  110 113*  CO2 23 22 19 20   GLUCOSE 101 107 125* 117*  BUN 62.5* 64.0* 66* 60*  CREATININE 4.3* 4.6* 4.60* 4.78*  CALCIUM 11.8* 11.6* 11.8* 10.6*  MG  --   --  2.2 1.9  PHOS  --   --  5.0* 4.9*   Liver Function Tests:  Recent Labs Lab 05/29/14 0900 05/30/14 1145 05/31/14 0501  AST 19 19 20   ALT 14 13 13   ALKPHOS 218* 222* 206*  BILITOT 0.36 0.4 0.2*  PROT 6.8 6.9 6.2  ALBUMIN 2.2* 2.3* 2.0*    Recent Labs Lab 05/30/14 1145  LIPASE 59   CBC:  Recent Labs Lab 05/28/14 0931 05/30/14 1145 05/31/14 0501  WBC 7.4 6.5 4.1  NEUTROABS 5.8 6.0 3.7  HGB 8.0* 7.7* 6.6*  HCT 25.2* 23.8* 20.5*  MCV 91.4 89.1 91.9  PLT 307 324 213   Cardiac Enzymes:  Recent Labs Lab 05/30/14 1145  TROPONINI <0.30   Recent Results (from the past 240 hour(s))  CULTURE, BLOOD (ROUTINE X 2)     Status: None   Collection Time    05/30/14 11:45 AM      Result Value Ref Range Status   Specimen Description BLOOD PORT   Final   Special Requests BOTTLES DRAWN AEROBIC AND ANAEROBIC 3ML   Final   Culture  Setup Time     Final   Value: 05/30/2014 14:32     Performed at [facility]   Culture     Final   Value:        BLOOD CULTURE RECEIVED NO GROWTH TO DATE CULTURE WILL BE HELD FOR 5 DAYS BEFORE ISSUING A FINAL NEGATIVE REPORT     Performed at [facility]   Report Status PENDING   Incomplete  CULTURE, BLOOD (ROUTINE X 2)     Status: None   Collection Time    05/30/14 11:45 AM      Result Value Ref Range Status   Specimen Description BLOOD L  HAND   Final   Special Requests BOTTLES DRAWN AEROBIC AND ANAEROBIC 5ML   Final   Culture  Setup Time     Final   Value: 05/30/2014 14:35     Performed at [facility]   Culture     Final   Value:        BLOOD CULTURE RECEIVED NO GROWTH TO DATE CULTURE WILL BE HELD FOR 5 DAYS BEFORE ISSUING A FINAL NEGATIVE REPORT     Performed at [facility]   Report Status PENDING   Incomplete     Scheduled Meds: . amLODipine  5 mg Oral Daily  . sodium chloride  10-40 mL Intracatheter Q12H   Continuous Infusions: . dextrose 5 % and 0.45% NaCl 100 mL/hr at 05/31/14 9532     Faye Ramsay, MD  [facility] Pager [phone]  If 7PM-7AM, please contact night-coverage [email or web address] Password [facility] 05/31/2014, 11:34 AM   LOS: 1 day

## 2014-05-31 NOTE — Progress Notes (Signed)
Patient XB:Lori Dennis      DOB: 1946/04/17      LKG:[id number]  Discussed case with Dr. Benay Spice last evening and this am.  Family desires hospice consult.  They will update the primary service on their decision for nephrostomy placement per Dr. Benay Spice.  Needs for this patient are a Hospice Referral.  I have placed the consult for Care management to offer Hospice Choice.  The Palliative Medicine team will sign off as this patient is in good hands with Dr. Benay Spice and hospice.    Della Homan L. Lovena Le, MD MBA The Palliative Medicine Team at [facility] Phone: [phone] Pager: [phone]

## 2014-05-31 NOTE — Consult Note (Signed)
Lori Dennis is an 68 y.o. female referred by Dr Olen Pel   Chief Complaint: CKD4, Ureteral obstruction HPI: [phone] WF with met carcinoid admitted yest for nausea and vomiting along with progressive fatigue.  She was found in 4/15 to have bilateral ureteral obstruction from her tumor and had stents placed. Scr was in the 5's and decrease to 3.6.  It subsequently increased to the 5's again and stents were exchanged with improvement in renal fx in 5/15 to a low of 2.4 on 05/17/14. Scr has now increased into the 4's and her UO has dropped off the last 24hr.  US shows bil hydro Lt>rt.  She was hesitant to consider perc nephrostomy tube but has now decided to proceed.  Past Medical History  Diagnosis Date  . Complication of anesthesia     slow to wake up  . Blood transfusion without reported diagnosis 2009 and 2011  . Hypertension     off all bp meds since Dec 20, 2012  . Chronic kidney disease 12-19-2012    elevated bun and creatinine   . Cancer 02/19/2008    Metastatic carcinoid tumor    Past Surgical History  Procedure Laterality Date  . Bowel resection  06/08/2010    ileocolonic resection, enteroenterostomy  . Left colectomy  04/02/2008    Exploratory laparotomy, biopsy of omental nodule, sigmoid colectomy  . Vaginal hysterectomy      at age 89  . Cholecystectomy      age 30  . Portacath placement Right 02/06/2013    Procedure: Ultrasound guided PORT-A-CATH INSERTION with flouro;  Surgeon: [name], MD;  Location: [facility];  Service: General;  Laterality: Right;  . Esophagogastroduodenoscopy N/A 06/28/2013    Procedure: ESOPHAGOGASTRODUODENOSCOPY (EGD);  Surgeon: Winfield Cunas., MD;  Location: [facility];  Service: Endoscopy;  Laterality: N/A;  . Cystoscopy with retrograde pyelogram, ureteroscopy and stent placement Bilateral 04/08/2014    Procedure: CYSTOSCOPY WITH BILATERAL  RETROGRADE PYELOGRAM,  AND  BILATERAL  STENT PLACEMENT;  Surgeon: Irine Seal, MD;  Location: [facility];   Service: Urology;  Laterality: Bilateral;  . Cystoscopy with retrograde pyelogram, ureteroscopy and stent placement Bilateral 05/03/2014    Procedure: CYSTOSCOPY WITH BILATERAL DOUBLE J STENT EXCHANGE, LEFT URETEROSCOPY;  Surgeon: Bernestine Amass, MD;  Location: [facility];  Service: Urology;  Laterality: Bilateral;    Family History  Problem Relation Age of Onset  . Heart disease Mother   . Cancer Sister     ovarian   Social History:  reports that she has never smoked. She has never used smokeless tobacco. She reports that she does not drink alcohol or use illicit drugs.  Allergies: No Known Allergies  Medications Prior to Admission  Medication Sig Dispense Refill  . amLODipine (NORVASC) 5 MG tablet Take 1 tablet (5 mg total) by mouth daily.  30 tablet  0  . antiseptic oral rinse (BIOTENE) LIQD 15 mLs by Mouth Rinse route as needed (for dry mouth).      . fat emulsion 20 % infusion Inject 100 mLs into the vein 2 (two) times a week. Wed and Sat      . ondansetron (ZOFRAN) 4 MG/2ML SOLN injection Inject 4 mg into the vein every 6 (six) hours as needed for nausea or vomiting.      Marland Kitchen oxyCODONE (ROXICODONE INTENSOL) 100 MG/5ML concentrated solution Take 0.3-0.5 mLs (6-10 mg total) by mouth every 4 (four) hours as needed for pain.  30 mL  0  . promethazine (  PHENERGAN) 25 MG/ML injection Inject 12.5 mg into the vein every 6 (six) hours as needed for nausea or vomiting.      . TPN ADULT Inject 2,000 mLs into the vein continuous. 14 hours/day start between 2000-2100 Five days a week With MVI         Lab Results: UA: 7-10 rbc, 0-2 wbc   Recent Labs  05/30/14 1145 05/31/14 0501  WBC 6.5 4.1  HGB 7.7* 6.6*  HCT 23.8* 20.5*  PLT 324 213   BMET  Recent Labs  05/29/14 0900 05/30/14 1145 05/31/14 0501  NA 142 147 146  K 4.4 4.4 3.9  CL  --  110 113*  CO2 _0 GLUCOSE 107 125* 117*  BUN 64.0* 66* 60*  CREATININE 4.6* 4.60* 4.78*  CALCIUM 11.6* 11.8* 10.6*  PHOS  --  5.0* 4.9*    LFT  Recent Labs  05/31/14 0501  PROT 6.2  ALBUMIN 2.0*  AST 20  ALT 13  ALKPHOS 206*  BILITOT 0.2*   Ct Abdomen Pelvis Wo Contrast  05/30/2014   CLINICAL DATA:  Fever with nausea and vomiting. Metastatic carcinoid. Small bowel obstruction. Renal stents. Ultrasound yesterday showed renal obstruction.  EXAM: CT ABDOMEN AND PELVIS WITHOUT CONTRAST  TECHNIQUE: Multidetector CT imaging of the abdomen and pelvis was performed following the standard protocol without IV contrast.  COMPARISON:  04/08/2014 CT.  FINDINGS: Bones: No aggressive osseous lesions are identified. Lower lumbar spondylosis and facet arthrosis.  Lung Bases: Small right pleural effusion layering dependently with associated compressive atelectasis. Collapse/ consolidation over the bases is most likely atelectasis. There is a tiny left pleural effusion also with some atelectasis. Tiny peripheral pulmonary nodule in the lateral left lower lobe (image 8 series 4).  Diaphragmatic lymph node is present (image 18 series 2) measuring 8 mm short axis, slightly larger than on the prior exam.  Liver: High density is present along the liver surface, compatible with peritoneal carcinomatosis and implantation along the liver capsule. Unenhanced CT was performed per clinician order. Lack of IV contrast limits sensitivity and specificity, especially for evaluation of abdominal/pelvic solid viscera. No definite intrahepatic mass is present.  Spleen:  Normal.  Gallbladder:  No calcified stones.  Partially contracted.  Common bile duct:  Normal.  Pancreas:  Normal.  Adrenal glands: Mild thickening of the left adrenal gland is stable compared to prior. No discrete mass lesion.  Kidneys: Bilateral double-J ureteral stents are present. The proximal loops or appropriately of reconstituted in the renal pelvis. Distal loops are reconstituted in the urinary bladder. There is persistent bilateral hydronephrosis. This was identified on ultrasound yesterday. This  may represent stent dysfunction or residual hydronephrosis following stent placement.  Stomach: Gastrostomy tube is present which appears in good position. Gastrojejunostomy present with staples along the inferior stomach. No definite acute gastric findings.  Small bowel: Small bowel is decompressed. No findings to suggest small bowel obstruction. In the left anatomic pelvis, there is an error in fluid containing structure which appears to represent a loop of small bowel. This is near the colostomy or MRI ostomy E in the left lower quadrant (image 47 series 2). This was present on the prior exam of 04/08/2014 but not as distended.  This is suboptimally evaluated in the absence of oral contrast. This appears confluent with the pelvic mass that measures slightly larger than on the prior exam, 7.4 x 6.7 cm.  Colon: Left colectomy. It is difficult to distinguish between colon and small bowel in  the absence of contrast. The ostomy in the left lower quadrant appears to represent a end colostomy.  Pelvic Genitourinary: Urinary bladder is decompressed. Double-J ureteral stents are reconstituted within the urinary bladder.  Vasculature: Mild atherosclerosis. No acute vascular abnormality on noncontrast CT.  Body Wall: Scarring along the anterior abdominal wall.  IMPRESSION: 1. Small right and trace left pleural effusions. Associated atelectasis. 2. 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 3. No convincing evidence of small bowel obstruction. There is a mildly dilated loop of what bowel in the left lower quadrant that is slightly larger than on prior exam. This is suboptimally evaluated in the absence of oral contrast. If further evaluation is warranted, administration of oral contrast and repeat CT scanning would offer better evaluation. 4. Bilateral hydronephrosis with good position of double-J ureteral stent. Persistent hydronephrosis may be residual following decompression or  represent stent dysfunction. 5. Increasing size of mass in the anatomic pelvis compatible with carcinoid tumor.   Electronically Signed   By: Dereck Ligas M.D.   On: 05/30/2014 13:00   Dg Chest Port 1 View  05/30/2014   CLINICAL DATA:  Metastatic abdominal carcinoma.  Hypertension.  EXAM: PORTABLE CHEST - 1 VIEW  COMPARISON:  02/06/2013  FINDINGS: Heart, mediastinum hila are unremarkable. Right anterior chest wall power Port-A-Cath has its catheter tip in the lower superior vena cava, stable.  There is blunting of the right hemidiaphragm, new from the prior study. This is likely due to a small effusion with atelectasis. No left pleural effusion. Lungs are otherwise clear. No pneumothorax.  Bony thorax is demineralized but grossly intact.  IMPRESSION: 1. No acute cardiopulmonary disease. 2. Small pleural effusion with atelectasis.   Electronically Signed   By: Lajean Manes M.D.   On: 05/30/2014 11:40   Dg Abd 2 Views  05/30/2014   CLINICAL DATA:  Metastatic abdominal carcinoid.  EXAM: ABDOMEN - 2 VIEW  COMPARISON:  05/30/2014 CT.  04/08/2014 CT.  FINDINGS: Percutaneous gastrostomy and bilateral double-J ureteral stents are present. There is tortuosity of the right double-J ureteral stent. The bowel gas pattern is nonobstructive. Bowel gas is present within the rectum. The abdomen is relatively gasless. There is no plain film evidence of free air. Right-sided decubitus was obtained.  IMPRESSION: 1. Nonobstructive bowel gas pattern. 2. Bilateral double-J ureteral stents and percutaneous gastrostomy. 3. No acute abnormality on plain film.   Electronically Signed   By: Dereck Ligas M.D.   On: 05/30/2014 12:25    ROS: No change in vision + nausea No CP Chronic abd pain Chronic swelling Lt leg No dysuria  PHYSICAL EXAM: Blood pressure 162/78, pulse 88, temperature 98.6 F (37 C), temperature source Oral, resp. rate 16, height 5' 3.5" (1.613 m), weight 61.5 kg (135 lb 9.3 oz), SpO2 99.00%. HEENT:  PERRLA EOMI NECK:No JVD.  + portacath rt upper chest LUNGS:clear CARDIAC:RRR ABD:G-tube LUQ, colostomy LLQ.  Firm mass palpable in abd EXT:tr edema rt, 2-3+ edema Lt leg NEURO:CNI, M&SI, Ox3 no asterixis  Assessment: 1. Acute on CKD3 sec Bil ureteral obstruction.  Scr has been abnormal since at least 4/15 and only marginally improved with ureteral stent placement. 2. Met Carcinoid tumor 3. Hypercalcemia prob sec to cancer PLAN: 1. Perc Lt kidney this afternoon.  Spoke to pt and family that this may be best way to restore renal fx as it appears that renal fx has only recently worsened to this level in last few weeks and UO has sig decreased 2.  Spoke to pharmacy about resuming electrolyte free TPN of 1Liter over 24hr for now.  This can be increased if UO increases after perc 3. Recheck labs in AM   Kani Chauvin T 05/31/2014, 1:53 PM

## 2014-05-31 NOTE — Progress Notes (Signed)
INITIAL NUTRITION ASSESSMENT  DOCUMENTATION CODES Per approved criteria  -Not Applicable   INTERVENTION: -Resume home TPN regimen per MD -TPN management per pharmacy -Diet advancement per MD (pt tolerating Mechanical Soft pta) -Will continue to monitor  NUTRITION DIAGNOSIS: Inadequate oral intake related to inability to eat as evidenced by NPO status.   Goal: TPN  to meet >/= 90% of their estimated nutrition needs    Monitor:  TPN orders, TPN tolerance, diet order renal profile, total protein/energy intake, labs, weighs  Reason for Assessment: Consult for TPN  68 y.o. female  Admitting Dx: Carcinoid tumor of intestine  ASSESSMENT: Pt is 68 yo female with metastatic carcinoid tumor, s/p colectomy with ileostomy on TPN, with double J stent due to tumor compression of the ureters presented to [facility] ED with main concern of several days duration of progressively worsening weakness, generalized abdominal pain, 8/10 in severity and radiating to bilateral flank areas, no specific aggravating or alleviating factors, associated with poor oral intake, fevers, nausea and non bloody vomiting  -Pt denied any weight changes or intolerance with home TPN regimen. Per Pharmacy note, home TPN cycles over 14 hours and provides 1192 kcal, and 50 gram protein -Can tolerate softer foods- yogurt, sweet potatoes, applesauce. Does not consume any supplementation as she does not like the taste. Has had some decreased appetite since Sunday d/t abd pain and emesis -Per discussion with family, pt's TPN with hung last night but stopped after one hour d/t concern for elevated Calcium and renal failure -NPO for possible placement of nephrostomy tube. MD recommended home hospice if pt decides against placement -Discussed initiation of TPN w/pharmacy. Pharmacy contacted oncology. Awaiting nephrology recommendations  -Phos elevated- will recommend pt to be placed on electrolyte free TPN  -Mg/K WNL -Triglycerides  WNL -Prealbumin pending  Height: Ht Readings from Last 1 Encounters:  05/30/14 5' 3.5" (1.613 m)    Weight: Wt Readings from Last 1 Encounters:  05/30/14 135 lb 9.3 oz (61.5 kg)    Ideal Body Weight: 115 lbs  % Ideal Body Weight: 117%  Wt Readings from Last 10 Encounters:  05/30/14 135 lb 9.3 oz (61.5 kg)  05/28/14 131 lb 8 oz (59.648 kg)  05/02/14 132 lb 15 oz (60.3 kg)  05/02/14 132 lb 15 oz (60.3 kg)  04/23/14 125 lb 1.6 oz (56.745 kg)  04/10/14 129 lb 6.4 oz (58.695 kg)  04/10/14 129 lb 6.4 oz (58.695 kg)  04/10/14 129 lb 6.4 oz (58.695 kg)  03/14/14 127 lb 8 oz (57.834 kg)  01/31/14 125 lb (56.7 kg)    Usual Body Weight: 124 lbs per previous medical records  % Usual Body Weight: 109%  BMI:  Body mass index is 23.64 kg/(m^2).  Estimated Nutritional Needs: Kcal: 1500-1700 Protein: 60-70 Fluid: per MD  Skin: WDL  Diet Order: NPO  EDUCATION NEEDS: -No education needs identified at this time   Intake/Output Summary (Last 24 hours) at 05/31/14 1222 Last data filed at 05/31/14 0952  Gross per 24 hour  Intake 1676.13 ml  Output   1170 ml  Net 506.13 ml    Last BM: 6/11   Labs:   Recent Labs Lab 05/29/14 0900 05/30/14 1145 05/31/14 0501  NA 142 147 146  K 4.4 4.4 3.9  CL  --  110 113*  CO2 22 19 20   BUN 64.0* 66* 60*  CREATININE 4.6* 4.60* 4.78*  CALCIUM 11.6* 11.8* 10.6*  MG  --  2.2 1.9  PHOS  --  5.0* 4.9*  GLUCOSE 107 125* 117*    CBG (last 3)  No results found for this basename: GLUCAP,  in the last 72 hours  Scheduled Meds: . amLODipine  5 mg Oral Daily  . sodium chloride  10-40 mL Intracatheter Q12H    Continuous Infusions: . dextrose 5 % and 0.45% NaCl 100 mL/hr at 05/31/14 7225    Past Medical History  Diagnosis Date  . Complication of anesthesia     slow to wake up  . Blood transfusion without reported diagnosis 2009 and 2011  . Hypertension     off all bp meds since Dec 20, 2012  . Chronic kidney disease  12-19-2012    elevated bun and creatinine   . Cancer 02/19/2008    Metastatic carcinoid tumor    Past Surgical History  Procedure Laterality Date  . Bowel resection  06/08/2010    ileocolonic resection, enteroenterostomy  . Left colectomy  04/02/2008    Exploratory laparotomy, biopsy of omental nodule, sigmoid colectomy  . Vaginal hysterectomy      at age 59  . Cholecystectomy      age 60  . Portacath placement Right 02/06/2013    Procedure: Ultrasound guided PORT-A-CATH INSERTION with flouro;  Surgeon: [name], MD;  Location: [facility];  Service: General;  Laterality: Right;  . Esophagogastroduodenoscopy N/A 06/28/2013    Procedure: ESOPHAGOGASTRODUODENOSCOPY (EGD);  Surgeon: Winfield Cunas., MD;  Location: [facility];  Service: Endoscopy;  Laterality: N/A;  . Cystoscopy with retrograde pyelogram, ureteroscopy and stent placement Bilateral 04/08/2014    Procedure: CYSTOSCOPY WITH BILATERAL  RETROGRADE PYELOGRAM,  AND  BILATERAL  STENT PLACEMENT;  Surgeon: Irine Seal, MD;  Location: [facility];  Service: Urology;  Laterality: Bilateral;  . Cystoscopy with retrograde pyelogram, ureteroscopy and stent placement Bilateral 05/03/2014    Procedure: CYSTOSCOPY WITH BILATERAL DOUBLE J STENT EXCHANGE, LEFT URETEROSCOPY;  Surgeon: Bernestine Amass, MD;  Location: [facility];  Service: Urology;  Laterality: Bilateral;    Marland LDN Clinical Dietitian [phone]

## 2014-05-31 NOTE — Progress Notes (Signed)
[facility]  Patient Status: Active (receiving services up to time of hospitalization)  [facility] is providing the following services: RN and TPN  If patient discharges after hours, please call [phone].   Lori Dennis 05/31/2014, 11:44 AM

## 2014-05-31 NOTE — Progress Notes (Signed)
PARENTERAL NUTRITION CONSULT NOTE - Follow Up  Pharmacy Consult for TNA Indication: Bowel obstruction  No Known Allergies  Patient Measurements: Height: 5' 3.5" (161.3 cm) Weight: 135 lb 9.3 oz (61.5 kg) IBW/kg (Calculated) : 53.55 Weight: 59.6 kg Height: 63 in Ideal Body Weight: 52.4 kg Adjusted Body Weight:  Usual Weight:   Vital Signs: Temp: 98.6 F (37 C) (06/12 1315) Temp src: Oral (06/12 1315) BP: 162/78 mmHg (06/12 1315) Pulse Rate: 88 (06/12 1315) Intake/Output from previous day: 06/11 0701 - 06/12 0700 In: 1676.1 [I.V.:1513.3; TPN:112.8] Out: 870 [Drains:850; Stool:20] Intake/Output from this shift: Total I/O In: 262.5 [Blood:262.5] Out: 300 [Drains:300]  Labs:  Recent Labs  05/30/14 1145 05/31/14 0501 05/31/14 0950  WBC 6.5 4.1  --   HGB 7.7* 6.6*  --   HCT 23.8* 20.5*  --   PLT 324 213  --   APTT  --   --  31  INR  --   --  1.01     Recent Labs  05/29/14 0900 05/30/14 1145 05/31/14 0501  NA 142 147 146  K 4.4 4.4 3.9  CL  --  110 113*  CO2 22 19 20   GLUCOSE 107 125* 117*  BUN 64.0* 66* 60*  CREATININE 4.6* 4.60* 4.78*  CALCIUM 11.6* 11.8* 10.6*  MG  --  2.2 1.9  PHOS  --  5.0* 4.9*  PROT 6.8 6.9 6.2  ALBUMIN 2.2* 2.3* 2.0*  AST 19 19 20   ALT 14 13 13   ALKPHOS 218* 222* 206*  BILITOT 0.36 0.4 0.2*  PREALBUMIN  --   --  20.2  TRIG  --   --  109   Estimated Creatinine Clearance: 9.7 ml/min (by C-G formula based on Cr of 4.78).   No results found for this basename: GLUCAP,  in the last 72 hours  Medical History: Past Medical History  Diagnosis Date  . Complication of anesthesia     slow to wake up  . Blood transfusion without reported diagnosis 2009 and 2011  . Hypertension     off all bp meds since Dec 20, 2012  . Chronic kidney disease 12-19-2012    elevated bun and creatinine   . Cancer 02/19/2008    Metastatic carcinoid tumor    Medications:  Scheduled:  . amLODipine  5 mg Oral Daily  . sodium chloride  10-40 mL  Intracatheter Q12H   Infusions:  . dextrose 5 % and 0.45% NaCl 100 mL/hr at 05/31/14 0649   PRN: antiseptic oral rinse, hydrALAZINE, LORazepam, morphine injection, ondansetron, oxyCODONE, promethazine, sodium chloride  Insulin Requirements in the past 24 hours:  None  Current Nutrition:  Per [facility] records:  Cycle TNA over 14 hours daily which provides 50 grams protein, 1192 kcal per day.  Additives per 2L bag: Sodium Phosphate 32mmol; Sodium Chloride 222meq; Potassium Chloride 65meq; Calcium Gluconate 18meq; Hyperlyte CR 72ml = 30meqK, 42meq Na, 36meq Ca, 58meq Mg; MVI 60ml; Addamel N 29ml; Famotidine 40mg ; no insulin.  IVF: NS 100 ml/hr  Assessment: 68 yo female with metastatic intestinal carcinoid with chronic ureteral obstruction. She has a gastrostomy tube in place for decompression and is chronic TNA at home. Presents from home with worsening abdominal pain, nausea, vomiting and fever for the past 3 days. Ultrasound yesterday showed severe bilateral hydronephrosis with stents in place; SCr has been rising.  Patient is being admitted 6/11 for palliative care consultation and pain management. Pharmacy is consulted to continue TNA on admission, but per renal  rec's Dr. Benay Spice stopped the TNA due to renal failure/hypercalcemia/hyperpho for further evaluation by renal before restart  Nutritional Goals:  RD consult ordered  TPN Access: implanted port  Plan:  1) continue to hold TNA per Dr. Benay Spice for further eval per renal 2) if/when hospice decided, question at that point as to whether TNA continuation would be beneficial  Adrian Saran, PharmD, BCPS Pager [phone] 05/31/2014 1:57 PM

## 2014-06-01 LAB — BASIC METABOLIC PANEL
BUN: 55 mg/dL — ABNORMAL HIGH (ref 6–23)
CHLORIDE: 110 meq/L (ref 96–112)
CO2: 21 mEq/L (ref 19–32)
Calcium: 10 mg/dL (ref 8.4–10.5)
Creatinine, Ser: 4.69 mg/dL — ABNORMAL HIGH (ref 0.50–1.10)
GFR, EST AFRICAN AMERICAN: 10 mL/min — AB (ref >90)
GFR, EST NON AFRICAN AMERICAN: 9 mL/min — AB (ref >90)
Glucose, Bld: 149 mg/dL — ABNORMAL HIGH (ref 70–99)
POTASSIUM: 3.5 meq/L — AB (ref 3.7–5.3)
SODIUM: 143 meq/L (ref 137–147)

## 2014-06-01 LAB — CBC
HCT: 29.2 % — ABNORMAL LOW (ref 36.0–46.0)
Hemoglobin: 9.6 g/dL — ABNORMAL LOW (ref 12.0–15.0)
MCH: 29.3 pg (ref 26.0–34.0)
MCHC: 32.9 g/dL (ref 30.0–36.0)
MCV: 89 fL (ref 78.0–100.0)
PLATELETS: 193 10*3/uL (ref 150–400)
RBC: 3.28 MIL/uL — ABNORMAL LOW (ref 3.87–5.11)
RDW: 15.7 % — AB (ref 11.5–15.5)
WBC: 3.9 10*3/uL — ABNORMAL LOW (ref 4.0–10.5)

## 2014-06-01 LAB — PHOSPHORUS: PHOSPHORUS: 4 mg/dL (ref 2.3–4.6)

## 2014-06-01 MED ORDER — DEXTROSE-NACL 5-0.45 % IV SOLN
INTRAVENOUS | Status: DC
Start: 2014-06-01 — End: 2014-06-03
  Administered 2014-06-02: 08:00:00 via INTRAVENOUS

## 2014-06-01 MED ORDER — FAT EMULSION 20 % IV EMUL
250.0000 mL | INTRAVENOUS | Status: AC
  Administered 2014-06-01: 250 mL via INTRAVENOUS
  Filled 2014-06-01 (×2): qty 250

## 2014-06-01 MED ORDER — TRACE MINERALS CR-CU-F-FE-I-MN-MO-SE-ZN IV SOLN
INTRAVENOUS | Status: AC
  Administered 2014-06-01: 18:00:00 via INTRAVENOUS
  Filled 2014-06-01: qty 1000

## 2014-06-01 NOTE — Progress Notes (Signed)
Subjective: Pt doing well; no new c/o other than mild left flank discomfort at PCN site  Objective: Vital signs in last 24 hours: Temp:  [97.6 F (36.4 C)-99.3 F (37.4 C)] 98.3 F (36.8 C) (06/13 0700) Pulse Rate:  [78-94] 78 (06/13 0700) Resp:  [12-18] 16 (06/13 0700) BP: (151-189)/(75-105) 177/84 mmHg (06/13 0700) SpO2:  [97 %-100 %] 97 % (06/13 0700) Last BM Date: 05/30/14  Intake/Output from previous day: 06/12 0701 - 06/13 0700 In: 3119 [P.O.:480; I.V.:1403.2; Blood:675; TPN:560.8] Out: 3825 [Urine:2625; Drains:1400] Intake/Output this shift: Total I/O In: -  Out: 250 [Urine:250]  Left PCN intact, dressing dry, mildly tender, output 1400 cc's sl blood tinged urine, creat down slightly to 4.69 (4.78) Lab Results:   Recent Labs  05/31/14 0501 06/01/14 0351  WBC 4.1 3.9*  HGB 6.6* 9.6*  HCT 20.5* 29.2*  PLT 213 193   BMET  Recent Labs  05/31/14 0501 06/01/14 0351  NA 146 143  K 3.9 3.5*  CL 113* 110  CO2 20 21  GLUCOSE 117* 149*  BUN 60* 55*  CREATININE 4.78* 4.69*  CALCIUM 10.6* 10.0   PT/INR  Recent Labs  05/31/14 0950  LABPROT 13.1  INR 1.01   ABG No results found for this basename: PHART, PCO2, PO2, HCO3,  in the last 72 hours  Studies/Results: Ct Abdomen Pelvis Wo Contrast  05/30/2014   CLINICAL DATA:  Fever with nausea and vomiting. Metastatic carcinoid. Small bowel obstruction. Renal stents. Ultrasound yesterday showed renal obstruction.  EXAM: CT ABDOMEN AND PELVIS WITHOUT CONTRAST  TECHNIQUE: Multidetector CT imaging of the abdomen and pelvis was performed following the standard protocol without IV contrast.  COMPARISON:  04/08/2014 CT.  FINDINGS: Bones: No aggressive osseous lesions are identified. Lower lumbar spondylosis and facet arthrosis.  Lung Bases: Small right pleural effusion layering dependently with associated compressive atelectasis. Collapse/ consolidation over the bases is most likely atelectasis. There is a tiny left  pleural effusion also with some atelectasis. Tiny peripheral pulmonary nodule in the lateral left lower lobe (image 8 series 4).  Diaphragmatic lymph node is present (image 18 series 2) measuring 8 mm short axis, slightly larger than on the prior exam.  Liver: High density is present along the liver surface, compatible with peritoneal carcinomatosis and implantation along the liver capsule. Unenhanced CT was performed per clinician order. Lack of IV contrast limits sensitivity and specificity, especially for evaluation of abdominal/pelvic solid viscera. No definite intrahepatic mass is present.  Spleen:  Normal.  Gallbladder:  No calcified stones.  Partially contracted.  Common bile duct:  Normal.  Pancreas:  Normal.  Adrenal glands: Mild thickening of the left adrenal gland is stable compared to prior. No discrete mass lesion.  Kidneys: Bilateral double-J ureteral stents are present. The proximal loops or appropriately of reconstituted in the renal pelvis. Distal loops are reconstituted in the urinary bladder. There is persistent bilateral hydronephrosis. This was identified on ultrasound yesterday. This may represent stent dysfunction or residual hydronephrosis following stent placement.  Stomach: Gastrostomy tube is present which appears in good position. Gastrojejunostomy present with staples along the inferior stomach. No definite acute gastric findings.  Small bowel: Small bowel is decompressed. No findings to suggest small bowel obstruction. In the left anatomic pelvis, there is an error in fluid containing structure which appears to represent a loop of small bowel. This is near the colostomy or MRI ostomy E in the left lower quadrant (image 47 series 2). This was present on the prior exam of  04/08/2014 but not as distended.  This is suboptimally evaluated in the absence of oral contrast. This appears confluent with the pelvic mass that measures slightly larger than on the prior exam, 7.4 x 6.7 cm.  Colon:  Left colectomy. It is difficult to distinguish between colon and small bowel in the absence of contrast. The ostomy in the left lower quadrant appears to represent a end colostomy.  Pelvic Genitourinary: Urinary bladder is decompressed. Double-J ureteral stents are reconstituted within the urinary bladder.  Vasculature: Mild atherosclerosis. No acute vascular abnormality on noncontrast CT.  Body Wall: Scarring along the anterior abdominal wall.  IMPRESSION: 1. Small right and trace left pleural effusions. Associated atelectasis. 2. 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 3. No convincing evidence of small bowel obstruction. There is a mildly dilated loop of what bowel in the left lower quadrant that is slightly larger than on prior exam. This is suboptimally evaluated in the absence of oral contrast. If further evaluation is warranted, administration of oral contrast and repeat CT scanning would offer better evaluation. 4. Bilateral hydronephrosis with good position of double-J ureteral stent. Persistent hydronephrosis may be residual following decompression or represent stent dysfunction. 5. Increasing size of mass in the anatomic pelvis compatible with carcinoid tumor.   Electronically Signed   By: Dereck Ligas M.D.   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 She has had bilateral double-J ureteral stents placed in the past with initial improvement in renal function followed by recurrent degradation. There ureteral stents were upsized resulting in a similar clinical course.  Currently, the patient has bilateral hydronephrosis and progressive renal deterioration. Additionally, there is a left ureteral stone. We will  proceed with placement of a single left percutaneous nephrostomy tube and monitor the patient's creatinine. If her creatinine decreases and/or stabilizes that may be all that is required. If her creatinine continues to deteriorate, a right sided percutaneous nephrostomy tube could be considered in the future.  ANESTHESIA/SEDATION: Versed 2 mg IV; Fentanyl 50 mcg IV  Total Moderate Sedation Time  10 minutes.  CONTRAST:  30m OMNIPAQUE IOHEXOL 300 MG/ML  SOLN  MEDICATIONS: 2 g Ancef administered intravenously  FLUOROSCOPY TIME:  1 minutes 12 seconds.  TECHNIQUE: The procedure, risks, benefits, and alternatives were explained to the patient. Questions regarding the procedure were encouraged and answered. The patient understands and consents to the procedure.  The left flank was prepped with chlorhexidine in a sterile fashion, and a sterile drape was applied covering the operative field. A sterile gown and sterile gloves were used for the procedure. Local anesthesia was provided with 1% Lidocaine.  The left flank was interrogated with ultrasound and the left kidney identified. The kidney is hydronephrotic. A suitable access site on the skin overlying the lower pole, posterior calix was identified. After local mg anesthesia was achieved, a small skin nick was made with an 11 blade scalpel. A 21 gauge Accustick needle was then advanced under direct sonographic guidance into a posterior lower pole calyx. A 0.018 inch wire was advanced under fluoroscopic guidance into the left renal collecting system. The Accustick sheath was then advanced over the wire and a 0.018 system exchanged for a 0.035 system. Gentle hand injection of contrast material confirms placement of the sheath within the renal  collecting system. There is there is a marked hydronephrosis and proximal ureteral nephrosis. The double-J stent is located within the upper pole infundibulum. The tract from the scan into the renal collecting system was then dilated  serially to 10-French. A 10-French Cook all-purpose drain was then placed and positioned under fluoroscopic guidance. The locking loop is well formed within the left renal pelvis. The catheter was secured to the skin with 2-0 Prolene and a sterile bandage was placed. Catheter was left to gravity bag drainage.  COMPLICATIONS: No immediate complications. The patient tolerated the procedure well and was returned to the floor in stable condition.  FINDINGS: Advanced hydro and proximal ureteral nephrosis secondary to distal obstruction. The double-J stent is well positioned.  IMPRESSION: Successful placement of a left 10 French percutaneous nephrostomy tube.  Hematuria should clear over 24-48 hrs. Monitor renal function. If creatinine improvement is insufficient, an additional right-sided nephrostomy tube could be considered in the future.  Signed,  Criselda Peaches, MD  [facility]  [facility]   Electronically Signed   By: Jacqulynn Cadet M.D.   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 She has had bilateral double-J ureteral stents placed in the past with initial improvement in renal function followed by recurrent degradation. There ureteral stents were upsized resulting in a similar clinical course.  Currently, the patient has bilateral hydronephrosis and progressive renal deterioration. Additionally, there is a left ureteral stone. We will proceed with placement of a single left percutaneous nephrostomy tube and monitor the patient's creatinine. If her creatinine decreases and/or stabilizes that may be all that is required. If her creatinine continues to deteriorate, a right sided percutaneous nephrostomy  tube could be considered in the future.  ANESTHESIA/SEDATION: Versed 2 mg IV; Fentanyl 50 mcg IV  Total Moderate Sedation Time  10 minutes.  CONTRAST:  15m OMNIPAQUE IOHEXOL 300 MG/ML  SOLN  MEDICATIONS: 2 g Ancef administered intravenously  FLUOROSCOPY TIME:  1 minutes 12 seconds.  TECHNIQUE: The procedure, risks, benefits, and alternatives were explained to the patient. Questions regarding the procedure were encouraged and answered. The patient understands and consents to the procedure.  The left flank was prepped with chlorhexidine in a sterile fashion, and a sterile drape was applied covering the operative field. A sterile gown and sterile gloves were used for the procedure. Local anesthesia was provided with 1% Lidocaine.  The left flank was interrogated with ultrasound and the left kidney identified. The kidney is hydronephrotic. A suitable access site on the skin overlying the lower pole, posterior calix was identified. After local mg anesthesia was achieved, a small skin nick was made with an 11 blade scalpel. A 21 gauge Accustick needle was then advanced under direct sonographic guidance into a posterior lower pole calyx. A 0.018 inch wire was advanced under fluoroscopic guidance into the left renal collecting system. The Accustick sheath was then advanced over the wire and a 0.018 system exchanged for a 0.035 system. Gentle hand injection of contrast material confirms placement of the sheath within the renal collecting system. There is there is a marked hydronephrosis and proximal ureteral nephrosis. The double-J stent is located within the upper pole infundibulum. The tract from the scan into the renal collecting system was then dilated serially to 10-French.  A 10-French Cook all-purpose drain was then placed and positioned under fluoroscopic guidance. The locking loop is well formed within the left renal pelvis. The catheter was secured to the skin with 2-0 Prolene and a sterile bandage was placed.  Catheter was left to gravity bag drainage.  COMPLICATIONS: No immediate complications. The patient tolerated the procedure well and was returned to the floor in stable condition.  FINDINGS: Advanced hydro and proximal ureteral nephrosis secondary to distal obstruction. The double-J stent is well positioned.  IMPRESSION: Successful placement of a left 10 French percutaneous nephrostomy tube.  Hematuria should clear over 24-48 hrs. Monitor renal function. If creatinine improvement is insufficient, an additional right-sided nephrostomy tube could be considered in the future.  Signed,  Criselda Peaches, MD  [facility]  [facility]   Electronically Signed   By: Jacqulynn Cadet M.D.   On: 05/31/2014 17:39   Dg Chest Port 1 View  05/30/2014   CLINICAL DATA:  Metastatic abdominal carcinoma.  Hypertension.  EXAM: PORTABLE CHEST - 1 VIEW  COMPARISON:  02/06/2013  FINDINGS: Heart, mediastinum hila are unremarkable. Right anterior chest wall power Port-A-Cath has its catheter tip in the lower superior vena cava, stable.  There is blunting of the right hemidiaphragm, new from the prior study. This is likely due to a small effusion with atelectasis. No left pleural effusion. Lungs are otherwise clear. No pneumothorax.  Bony thorax is demineralized but grossly intact.  IMPRESSION: 1. No acute cardiopulmonary disease. 2. Small pleural effusion with atelectasis.   Electronically Signed   By: Lajean Manes M.D.   On: 05/30/2014 11:40   Dg Abd 2 Views  05/30/2014   CLINICAL DATA:  Metastatic abdominal carcinoid.  EXAM: ABDOMEN - 2 VIEW  COMPARISON:  05/30/2014 CT.  04/08/2014 CT.  FINDINGS: Percutaneous gastrostomy and bilateral double-J ureteral stents are present. There is tortuosity of the right double-J ureteral stent. The bowel gas pattern is nonobstructive. Bowel gas is present within the rectum. The abdomen is relatively gasless. There is no plain film evidence of free  air. Right-sided decubitus was obtained.  IMPRESSION: 1. Nonobstructive bowel gas pattern. 2. Bilateral double-J ureteral stents and percutaneous gastrostomy. 3. No acute abnormality on plain film.   Electronically Signed   By: Dereck Ligas M.D.   On: 05/30/2014 12:25    Anti-infectives: Anti-infectives   Start     Dose/Rate Route Frequency Ordered Stop   05/31/14 1500  ceFAZolin (ANCEF) IVPB 2 g/50 mL premix     2 g 100 mL/hr over 30 Minutes Intravenous On call 05/31/14 1452 05/31/14 1636      Assessment/Plan: s/p left PCN 6/12 secondary to obstructive hydronephrosis from met carcinoid tumor; monitor renal fxn; replace K as needed; if creatinine improvement is insufficient, an additional right-sided  nephrostomy tube could be considered in the future.     LOS: 2 days    Adael Culbreath,D [facility] 06/01/2014

## 2014-06-01 NOTE — Progress Notes (Signed)
S: Minimal pain from perc O:BP 177/84  Pulse 78  Temp(Src) 98.3 F (36.8 C) (Oral)  Resp 16  Ht 5' 3.5" (1.613 m)  Wt 61.5 kg (135 lb 9.3 oz)  BMI 23.64 kg/m2  SpO2 97%  Intake/Output Summary (Last 24 hours) at 06/01/14 1105 Last data filed at 06/01/14 0702  Gross per 24 hour  Intake 3119.01 ml  Output   3975 ml  Net -855.99 ml   Weight change:  TWS:FKCLE and alert CVS:RRR Resp:clear Abd:G-tube.  LLQ colostomy.  Lt perc passing fair amt light yellow urine Ext: tr edema rt 2-3+ edema Lt NEURO:CNI Ox3 no asterixis   . amLODipine  5 mg Oral Daily  . sodium chloride  10-40 mL Intracatheter Q12H   Ct Abdomen Pelvis Wo Contrast  05/30/2014   CLINICAL DATA:  Fever with nausea and vomiting. Metastatic carcinoid. Small bowel obstruction. Renal stents. Ultrasound yesterday showed renal obstruction.  EXAM: CT ABDOMEN AND PELVIS WITHOUT CONTRAST  TECHNIQUE: Multidetector CT imaging of the abdomen and pelvis was performed following the standard protocol without IV contrast.  COMPARISON:  04/08/2014 CT.  FINDINGS: Bones: No aggressive osseous lesions are identified. Lower lumbar spondylosis and facet arthrosis.  Lung Bases: Small right pleural effusion layering dependently with associated compressive atelectasis. Collapse/ consolidation over the bases is most likely atelectasis. There is a tiny left pleural effusion also with some atelectasis. Tiny peripheral pulmonary nodule in the lateral left lower lobe (image 8 series 4).  Diaphragmatic lymph node is present (image 18 series 2) measuring 8 mm short axis, slightly larger than on the prior exam.  Liver: High density is present along the liver surface, compatible with peritoneal carcinomatosis and implantation along the liver capsule. Unenhanced CT was performed per clinician order. Lack of IV contrast limits sensitivity and specificity, especially for evaluation of abdominal/pelvic solid viscera. No definite intrahepatic mass is present.  Spleen:   Normal.  Gallbladder:  No calcified stones.  Partially contracted.  Common bile duct:  Normal.  Pancreas:  Normal.  Adrenal glands: Mild thickening of the left adrenal gland is stable compared to prior. No discrete mass lesion.  Kidneys: Bilateral double-J ureteral stents are present. The proximal loops or appropriately of reconstituted in the renal pelvis. Distal loops are reconstituted in the urinary bladder. There is persistent bilateral hydronephrosis. This was identified on ultrasound yesterday. This may represent stent dysfunction or residual hydronephrosis following stent placement.  Stomach: Gastrostomy tube is present which appears in good position. Gastrojejunostomy present with staples along the inferior stomach. No definite acute gastric findings.  Small bowel: Small bowel is decompressed. No findings to suggest small bowel obstruction. In the left anatomic pelvis, there is an error in fluid containing structure which appears to represent a loop of small bowel. This is near the colostomy or MRI ostomy E in the left lower quadrant (image 47 series 2). This was present on the prior exam of 04/08/2014 but not as distended.  This is suboptimally evaluated in the absence of oral contrast. This appears confluent with the pelvic mass that measures slightly larger than on the prior exam, 7.4 x 6.7 cm.  Colon: Left colectomy. It is difficult to distinguish between colon and small bowel in the absence of contrast. The ostomy in the left lower quadrant appears to represent a end colostomy.  Pelvic Genitourinary: Urinary bladder is decompressed. Double-J ureteral stents are reconstituted within the urinary bladder.  Vasculature: Mild atherosclerosis. No acute vascular abnormality on noncontrast CT.  Body Wall: Scarring along  the anterior abdominal wall.  IMPRESSION: 1. Small right and trace left pleural effusions. Associated atelectasis. 2. 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 3. No convincing evidence of small bowel obstruction. There is a mildly dilated loop of what bowel in the left lower quadrant that is slightly larger than on prior exam. This is suboptimally evaluated in the absence of oral contrast. If further evaluation is warranted, administration of oral contrast and repeat CT scanning would offer better evaluation. 4. Bilateral hydronephrosis with good position of double-J ureteral stent. Persistent hydronephrosis may be residual following decompression or represent stent dysfunction. 5. Increasing size of mass in the anatomic pelvis compatible with carcinoid tumor.   Electronically Signed   By: Dereck Ligas M.D.   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 She has had bilateral double-J ureteral stents placed in the past with initial improvement in renal function followed by recurrent degradation. There ureteral stents were upsized resulting in a similar clinical course.  Currently, the patient has bilateral hydronephrosis and progressive renal deterioration. Additionally, there is a left ureteral stone. We will proceed with placement of a single left percutaneous nephrostomy tube and monitor the patient's creatinine. If her creatinine decreases and/or stabilizes that may be all that is required. If her creatinine continues to deteriorate, a right sided percutaneous nephrostomy tube could be considered in the future.  ANESTHESIA/SEDATION: Versed 2 mg IV; Fentanyl 50 mcg IV  Total Moderate Sedation Time  10 minutes.  CONTRAST:  6m OMNIPAQUE IOHEXOL 300 MG/ML  SOLN  MEDICATIONS: 2 g Ancef administered intravenously  FLUOROSCOPY TIME:  1 minutes 12 seconds.  TECHNIQUE: The procedure, risks, benefits, and  alternatives were explained to the patient. Questions regarding the procedure were encouraged and answered. The patient understands and consents to the procedure.  The left flank was prepped with chlorhexidine in a sterile fashion, and a sterile drape was applied covering the operative field. A sterile gown and sterile gloves were used for the procedure. Local anesthesia was provided with 1% Lidocaine.  The left flank was interrogated with ultrasound and the left kidney identified. The kidney is hydronephrotic. A suitable access site on the skin overlying the lower pole, posterior calix was identified. After local mg anesthesia was achieved, a small skin nick was made with an 11 blade scalpel. A 21 gauge Accustick needle was then advanced under direct sonographic guidance into a posterior lower pole calyx. A 0.018 inch wire was advanced under fluoroscopic guidance into the left renal collecting system. The Accustick sheath was then advanced over the wire and a 0.018 system exchanged for a 0.035 system. Gentle hand injection of contrast material confirms placement of the sheath within the renal collecting system. There is there is a marked hydronephrosis and proximal ureteral nephrosis. The double-J stent is located within the upper pole infundibulum. The tract from the scan into the renal collecting system was then dilated serially to 10-French. A 10-French Cook all-purpose drain was then placed and positioned under fluoroscopic guidance. The locking loop is well formed within the left renal pelvis. The catheter was secured to the skin with 2-0 Prolene and a sterile bandage was placed. Catheter was left to gravity bag drainage.  COMPLICATIONS: No immediate complications. The patient tolerated the procedure well and was returned  to the floor in stable condition.  FINDINGS: Advanced hydro and proximal ureteral nephrosis secondary to distal obstruction. The double-J stent is well positioned.  IMPRESSION: Successful  placement of a left 10 French percutaneous nephrostomy tube.  Hematuria should clear over 24-48 hrs. Monitor renal function. If creatinine improvement is insufficient, an additional right-sided nephrostomy tube could be considered in the future.  Signed,  Criselda Peaches, MD  [facility]  [facility]   Electronically Signed   By: Jacqulynn Cadet M.D.   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 She has had bilateral double-J ureteral stents placed in the past with initial improvement in renal function followed by recurrent degradation. There ureteral stents were upsized resulting in a similar clinical course.  Currently, the patient has bilateral hydronephrosis and progressive renal deterioration. Additionally, there is a left ureteral stone. We will proceed with placement of a single left percutaneous nephrostomy tube and monitor the patient's creatinine. If her creatinine decreases and/or stabilizes that may be all that is required. If her creatinine continues to deteriorate, a right sided percutaneous nephrostomy tube could be considered in the future.  ANESTHESIA/SEDATION: Versed 2 mg IV; Fentanyl 50 mcg IV  Total Moderate Sedation Time  10 minutes.  CONTRAST:  25m OMNIPAQUE IOHEXOL 300 MG/ML  SOLN  MEDICATIONS: 2 g Ancef administered intravenously  FLUOROSCOPY TIME:  1 minutes 12 seconds.  TECHNIQUE: The procedure, risks, benefits, and alternatives were explained to the patient. Questions regarding the procedure were encouraged and answered. The patient understands and consents to the procedure.  The left flank was prepped with chlorhexidine in a sterile fashion, and a sterile drape was applied covering  the operative field. A sterile gown and sterile gloves were used for the procedure. Local anesthesia was provided with 1% Lidocaine.  The left flank was interrogated with ultrasound and the left kidney identified. The kidney is hydronephrotic. A suitable access site on the skin overlying the lower pole, posterior calix was identified. After local mg anesthesia was achieved, a small skin nick was made with an 11 blade scalpel. A 21 gauge Accustick needle was then advanced under direct sonographic guidance into a posterior lower pole calyx. A 0.018 inch wire was advanced under fluoroscopic guidance into the left renal collecting system. The Accustick sheath was then advanced over the wire and a 0.018 system exchanged for a 0.035 system. Gentle hand injection of contrast material confirms placement of the sheath within the renal collecting system. There is there is a marked hydronephrosis and proximal ureteral nephrosis. The double-J stent is located within the upper pole infundibulum. The tract from the scan into the renal collecting system was then dilated serially to 10-French. A 10-French Cook all-purpose drain was then placed and positioned under fluoroscopic guidance. The locking loop is well formed within the left renal pelvis. The catheter was secured to the skin with 2-0 Prolene and a sterile bandage was placed. Catheter was left to gravity bag drainage.  COMPLICATIONS: No immediate complications. The patient tolerated the procedure well and was returned to the floor in stable condition.  FINDINGS: Advanced hydro and proximal ureteral nephrosis secondary to distal obstruction. The double-J stent is well positioned.  IMPRESSION: Successful placement of a left 10 French percutaneous nephrostomy tube.  Hematuria should  clear over 24-48 hrs. Monitor renal function. If creatinine improvement is insufficient, an additional right-sided nephrostomy tube could be considered in the future.  Signed,  Criselda Peaches,  MD  [facility]  [facility]   Electronically Signed   By: Jacqulynn Cadet M.D.   On: 05/31/2014 17:39   Dg Chest Port 1 View  05/30/2014   CLINICAL DATA:  Metastatic abdominal carcinoma.  Hypertension.  EXAM: PORTABLE CHEST - 1 VIEW  COMPARISON:  02/06/2013  FINDINGS: Heart, mediastinum hila are unremarkable. Right anterior chest wall power Port-A-Cath has its catheter tip in the lower superior vena cava, stable.  There is blunting of the right hemidiaphragm, new from the prior study. This is likely due to a small effusion with atelectasis. No left pleural effusion. Lungs are otherwise clear. No pneumothorax.  Bony thorax is demineralized but grossly intact.  IMPRESSION: 1. No acute cardiopulmonary disease. 2. Small pleural effusion with atelectasis.   Electronically Signed   By: Lajean Manes M.D.   On: 05/30/2014 11:40   Dg Abd 2 Views  05/30/2014   CLINICAL DATA:  Metastatic abdominal carcinoid.  EXAM: ABDOMEN - 2 VIEW  COMPARISON:  05/30/2014 CT.  04/08/2014 CT.  FINDINGS: Percutaneous gastrostomy and bilateral double-J ureteral stents are present. There is tortuosity of the right double-J ureteral stent. The bowel gas pattern is nonobstructive. Bowel gas is present within the rectum. The abdomen is relatively gasless. There is no plain film evidence of free air. Right-sided decubitus was obtained.  IMPRESSION: 1. Nonobstructive bowel gas pattern. 2. Bilateral double-J ureteral stents and percutaneous gastrostomy. 3. No acute abnormality on plain film.   Electronically Signed   By: Dereck Ligas M.D.   On: 05/30/2014 12:25   BMET    Component Value Date/Time   NA 143 06/01/2014 0351   NA 142 05/29/2014 0900   NA 144 06/26/2012 1401   K 3.5* 06/01/2014 0351   K 4.4 05/29/2014 0900   K 5.1* 06/26/2012 1401   CL 110 06/01/2014 0351   CL 92* 01/26/2013 1322   CL 97* 06/26/2012 1401   CO2 21 06/01/2014 0351   CO2 22 05/29/2014 0900   CO2 29 06/26/2012 1401    GLUCOSE 149* 06/01/2014 0351   GLUCOSE 107 05/29/2014 0900   GLUCOSE 156* 01/26/2013 1322   GLUCOSE 131* 06/26/2012 1401   BUN 55* 06/01/2014 0351   BUN 64.0* 05/29/2014 0900   BUN 20 06/26/2012 1401   CREATININE 4.69* 06/01/2014 0351   CREATININE 4.6* 05/29/2014 0900   CREATININE 1.0 06/26/2012 1401   CALCIUM 10.0 06/01/2014 0351   CALCIUM 11.6* 05/29/2014 0900   CALCIUM 8.2 06/26/2012 1401   GFRNONAA 9* 06/01/2014 0351   GFRAA 10* 06/01/2014 0351   CBC    Component Value Date/Time   WBC 3.9* 06/01/2014 0351   WBC 7.4 05/28/2014 0931   RBC 3.28* 06/01/2014 0351   RBC 2.76* 05/28/2014 0931   HGB 9.6* 06/01/2014 0351   HGB 8.0* 05/28/2014 0931   HCT 29.2* 06/01/2014 0351   HCT 25.2* 05/28/2014 0931   PLT 193 06/01/2014 0351   PLT 307 05/28/2014 0931   MCV 89.0 06/01/2014 0351   MCV 91.4 05/28/2014 0931   MCH 29.3 06/01/2014 0351   MCH 29.0 05/28/2014 0931   MCHC 32.9 06/01/2014 0351   MCHC 31.7 05/28/2014 0931   RDW 15.7* 06/01/2014 0351   RDW 15.4* 05/28/2014 0931   LYMPHSABS 0.3* 05/31/2014 0501   LYMPHSABS 1.1 05/28/2014 1448  MONOABS 0.1 05/31/2014 0501   MONOABS 0.4 05/28/2014 0931   EOSABS 0.0 05/31/2014 0501   EOSABS 0.1 05/28/2014 0931   BASOSABS 0.0 05/31/2014 0501   BASOSABS 0.1 05/28/2014 0931     Assessment: 1. Acute on CKD sec bil ureteral obstruction from tumor SP Lt Perc nephrostomy tube with good UO 2. Met carcinoid tumor 3. Hypercalcemia, sl improved  Plan: 1.  Could increase TPN to 2liters.  Will let Primary service or Onc manage the TPN.    Anticipate renal fx to improve over next few days. 2. Daily Scr  Lori Dennis

## 2014-06-01 NOTE — Progress Notes (Signed)
PARENTERAL NUTRITION CONSULT NOTE - Follow Up  Pharmacy Consult for TNA Indication: Bowel obstruction  No Known Allergies  Patient Measurements: Height: 5' 3.5" (161.3 cm) Weight: 135 lb 9.3 oz (61.5 kg) IBW/kg (Calculated) : 53.55 Weight: 59.6 kg Height: 63 in Ideal Body Weight: 52.4 kg Adjusted Body Weight:  Usual Weight:   Vital Signs: Temp: 98.3 F (36.8 C) (06/13 0700) Temp src: Oral (06/13 0700) BP: 177/84 mmHg (06/13 0700) Pulse Rate: 78 (06/13 0700) Intake/Output from previous day: 06/12 0701 - 06/13 0700 In: 3119 [P.O.:480; I.V.:1403.2; Blood:675; TPN:560.8] Out: 4025 [Urine:2625; Drains:1400] Intake/Output from this shift: Total I/O In: -  Out: 670 [Urine:670]  Labs:  Recent Labs  05/30/14 1145 05/31/14 0501 05/31/14 0950 06/01/14 0351  WBC 6.5 4.1  --  3.9*  HGB 7.7* 6.6*  --  9.6*  HCT 23.8* 20.5*  --  29.2*  PLT 324 213  --  193  APTT  --   --  31  --   INR  --   --  1.01  --      Recent Labs  05/30/14 1145 05/31/14 0501 06/01/14 0351  NA 147 146 143  K 4.4 3.9 3.5*  CL 110 113* 110  CO2 19 20 21   GLUCOSE 125* 117* 149*  BUN 66* 60* 55*  CREATININE 4.60* 4.78* 4.69*  CALCIUM 11.8* 10.6* 10.0  MG 2.2 1.9  --   PHOS 5.0* 4.9* 4.0  PROT 6.9 6.2  --   ALBUMIN 2.3* 2.0*  --   AST 19 20  --   ALT 13 13  --   ALKPHOS 222* 206*  --   BILITOT 0.4 0.2*  --   PREALBUMIN  --  20.2  --   TRIG  --  109  --    Estimated Creatinine Clearance: 9.8 ml/min (by C-G formula based on Cr of 4.69).   No results found for this basename: GLUCAP,  in the last 72 hours  Medical History: Past Medical History  Diagnosis Date  . Complication of anesthesia     slow to wake up  . Blood transfusion without reported diagnosis 2009 and 2011  . Hypertension     off all bp meds since Dec 20, 2012  . Chronic kidney disease 12-19-2012    elevated bun and creatinine   . Cancer 02/19/2008    Metastatic carcinoid tumor    Medications:  Scheduled:  .  amLODipine  5 mg Oral Daily  . sodium chloride  10-40 mL Intracatheter Q12H   Infusions:  . dextrose 5 % and 0.45% NaCl 35 mL/hr at 06/01/14 0641  . TPN (CLINIMIX) Adult without lytes 40 mL/hr at 05/31/14 1847   And  . fat emulsion 250 mL (05/31/14 1847)   PRN: antiseptic oral rinse, hydrALAZINE, LORazepam, morphine injection, ondansetron, oxyCODONE, promethazine, sodium chloride  Insulin Requirements in the past 24 hours:  None   Current Nutrition:  Per [place] records:  Cycle TNA over 14 hours daily which provides 50 grams protein, 1192 kcal per day.  Additives per 2L bag: Sodium Phosphate 11mmol; Sodium Chloride 289meq; Potassium Chloride 56meq; Calcium Gluconate 25meq; Hyperlyte CR 3ml = 77meqK, 44meq Na, 29meq Ca, 61meq Mg; MVI 61ml; Addamel N 10ml; Famotidine 40mg ; no insulin.   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 She has a gastrostomy tube in place for decompression and is chronic TNA at home. Presents from home with  worsening abdominal pain, nausea, vomiting and fever for the past 3 days. Ultrasound yesterday showed severe bilateral hydronephrosis with stents in place; SCr has been rising.  Patient is being admitted 6/11 for palliative care consultation and pain management. Pharmacy is consulted to continue TNA on admission.  Glucose: serum glucose ok  Electrolytes: corrected calcium elevated 11.6 with albumin 2.0; K low at 3.5; all others wnl.  LFTs: AlkPhos elevated but consistent with previous results, AST/ALT and TBili wnl  Renal function: SCr high but stable TGs: 109 (6/12) Prealbumin: 20.2 (6/12)  Nutritional Goals:  RD: Continue per home TPN regimen to provide 1192 kcal and 50g protein. Recommended pt to be placed on electrolyte free TPN.  TPN Access: implanted port   Plan: At 1800 today:  Start Clinimix 5/15, but will only give 1L over 14hours in order to only  provide 50g protein, 1190kcal to match PTA formulation. 63ml/hr ramp up and down, 59ml/hr x 12hrs. 20% fat emulsion at 8ml/hr for 14hrs.  TNA to contain standard multivitamins and trace elements daily  Reduce IVF to [facility] while TNA infusing, keep 138ml/hr per MD when TNA is off.  Check CBGs 4 times daily (2hr after TNA starts, 1hr after stops, once during TNA infusion and once off TNA) .  TNA lab panels on Mondays & Thursdays.  F/u daily.   Kizzie Furnish, PharmD Pager: [phone] 06/01/2014 12:19 PM

## 2014-06-01 NOTE — Progress Notes (Signed)
Subjective: Admission H&P reviewed, hematology, nephrology input reviewed. Patient is status post left nephrostomy tube, creatinine stable. Tolerated transfusion well, she denies fever or chills.  Objective: Vital signs in last 24 hours: Temp:  [97.6 F (36.4 C)-99.3 F (37.4 C)] 98.3 F (36.8 C) (06/13 0700) Pulse Rate:  [78-94] 78 (06/13 0700) Resp:  [12-18] 16 (06/13 0700) BP: (156-189)/(75-105) 177/84 mmHg (06/13 0700) SpO2:  [97 %-100 %] 97 % (06/13 0700) Weight change:  Last BM Date: 05/30/14  Intake/Output from previous day: 06/12 0701 - 06/13 0700 In: 3119 [P.O.:480; I.V.:1403.2; Blood:675; TPN:560.8] Out: 4196 [Urine:2625; Drains:1400] Intake/Output this shift: Total I/O In: -  Out: 250 [Urine:250]  General appearance: alert, cooperative and cachectic Resp: clear to auscultation bilaterally Cardio: regular rate and rhythm, S1, S2 normal, no murmur, click, rub or gallop GI: Soft, no distention, positive bowel sounds Extremities: extremities normal, atraumatic, no cyanosis or edema  Lab Results:  Results for orders placed during the hospital encounter of 05/30/14 (from the past 24 hour(s))  CBC     Status: Abnormal   Collection Time    06/01/14  3:51 AM      Result Value Ref Range   WBC 3.9 (*) 4.0 - 10.5 K/uL   RBC 3.28 (*) 3.87 - 5.11 MIL/uL   Hemoglobin 9.6 (*) 12.0 - 15.0 g/dL   HCT 29.2 (*) 36.0 - 46.0 %   MCV 89.0  78.0 - 100.0 fL   MCH 29.3  26.0 - 34.0 pg   MCHC 32.9  30.0 - 36.0 g/dL   RDW 15.7 (*) 11.5 - 15.5 %   Platelets 193  150 - 400 K/uL  BASIC METABOLIC PANEL     Status: Abnormal   Collection Time    06/01/14  3:51 AM      Result Value Ref Range   Sodium 143  137 - 147 mEq/L   Potassium 3.5 (*) 3.7 - 5.3 mEq/L   Chloride 110  96 - 112 mEq/L   CO2 21  19 - 32 mEq/L   Glucose, Bld 149 (*) 70 - 99 mg/dL   BUN 55 (*) 6 - 23 mg/dL   Creatinine, Ser 4.69 (*) 0.50 - 1.10 mg/dL   Calcium 10.0  8.4 - 10.5 mg/dL   GFR calc non Af Amer 9 (*) >90  mL/min   GFR calc Af Amer 10 (*) >90 mL/min  PHOSPHORUS     Status: None   Collection Time    06/01/14  3:51 AM      Result Value Ref Range   Phosphorus 4.0  2.3 - 4.6 mg/dL      Studies/Results: Ct Abdomen Pelvis Wo Contrast  05/30/2014   CLINICAL DATA:  Fever with nausea and vomiting. Metastatic carcinoid. Small bowel obstruction. Renal stents. Ultrasound yesterday showed renal obstruction.  EXAM: CT ABDOMEN AND PELVIS WITHOUT CONTRAST  TECHNIQUE: Multidetector CT imaging of the abdomen and pelvis was performed following the standard protocol without IV contrast.  COMPARISON:  04/08/2014 CT.  FINDINGS: Bones: No aggressive osseous lesions are identified. Lower lumbar spondylosis and facet arthrosis.  Lung Bases: Small right pleural effusion layering dependently with associated compressive atelectasis. Collapse/ consolidation over the bases is most likely atelectasis. There is a tiny left pleural effusion also with some atelectasis. Tiny peripheral pulmonary nodule in the lateral left lower lobe (image 8 series 4).  Diaphragmatic lymph node is present (image 18 series 2) measuring 8 mm short axis, slightly larger than on the prior exam.  Liver: High  density is present along the liver surface, compatible with peritoneal carcinomatosis and implantation along the liver capsule. Unenhanced CT was performed per clinician order. Lack of IV contrast limits sensitivity and specificity, especially for evaluation of abdominal/pelvic solid viscera. No definite intrahepatic mass is present.  Spleen:  Normal.  Gallbladder:  No calcified stones.  Partially contracted.  Common bile duct:  Normal.  Pancreas:  Normal.  Adrenal glands: Mild thickening of the left adrenal gland is stable compared to prior. No discrete mass lesion.  Kidneys: Bilateral double-J ureteral stents are present. The proximal loops or appropriately of reconstituted in the renal pelvis. Distal loops are reconstituted in the urinary bladder. There  is persistent bilateral hydronephrosis. This was identified on ultrasound yesterday. This may represent stent dysfunction or residual hydronephrosis following stent placement.  Stomach: Gastrostomy tube is present which appears in good position. Gastrojejunostomy present with staples along the inferior stomach. No definite acute gastric findings.  Small bowel: Small bowel is decompressed. No findings to suggest small bowel obstruction. In the left anatomic pelvis, there is an error in fluid containing structure which appears to represent a loop of small bowel. This is near the colostomy or MRI ostomy E in the left lower quadrant (image 47 series 2). This was present on the prior exam of 04/08/2014 but not as distended.  This is suboptimally evaluated in the absence of oral contrast. This appears confluent with the pelvic mass that measures slightly larger than on the prior exam, 7.4 x 6.7 cm.  Colon: Left colectomy. It is difficult to distinguish between colon and small bowel in the absence of contrast. The ostomy in the left lower quadrant appears to represent a end colostomy.  Pelvic Genitourinary: Urinary bladder is decompressed. Double-J ureteral stents are reconstituted within the urinary bladder.  Vasculature: Mild atherosclerosis. No acute vascular abnormality on noncontrast CT.  Body Wall: Scarring along the anterior abdominal wall.  IMPRESSION: 1. Small right and trace left pleural effusions. Associated atelectasis. 2. 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 3. No convincing evidence of small bowel obstruction. There is a mildly dilated loop of what bowel in the left lower quadrant that is slightly larger than on prior exam. This is suboptimally evaluated in the absence of oral contrast. If further evaluation is warranted, administration of oral contrast and repeat CT scanning would offer better evaluation. 4. Bilateral hydronephrosis with good position of double-J  ureteral stent. Persistent hydronephrosis may be residual following decompression or represent stent dysfunction. 5. Increasing size of mass in the anatomic pelvis compatible with carcinoid tumor.   Electronically Signed   By: Dereck Ligas M.D.   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 She has had bilateral double-J ureteral stents placed in the past with initial improvement in renal function followed by recurrent degradation. There ureteral stents were upsized resulting in a similar clinical course.  Currently, the patient has bilateral hydronephrosis and progressive renal deterioration. Additionally, there is a left ureteral stone. We will proceed with placement of a single left percutaneous nephrostomy tube and monitor the patient's creatinine. If her creatinine decreases and/or stabilizes that may be all that is required. If her creatinine continues to deteriorate, a right sided percutaneous nephrostomy tube could be considered in the future.  ANESTHESIA/SEDATION: Versed 2  mg IV; Fentanyl 50 mcg IV  Total Moderate Sedation Time  10 minutes.  CONTRAST:  58mL OMNIPAQUE IOHEXOL 300 MG/ML  SOLN  MEDICATIONS: 2 g Ancef administered intravenously  FLUOROSCOPY TIME:  1 minutes 12 seconds.  TECHNIQUE: The procedure, risks, benefits, and alternatives were explained to the patient. Questions regarding the procedure were encouraged and answered. The patient understands and consents to the procedure.  The left flank was prepped with chlorhexidine in a sterile fashion, and a sterile drape was applied covering the operative field. A sterile gown and sterile gloves were used for the procedure. Local anesthesia was provided with 1% Lidocaine.  The left flank was  interrogated with ultrasound and the left kidney identified. The kidney is hydronephrotic. A suitable access site on the skin overlying the lower pole, posterior calix was identified. After local mg anesthesia was achieved, a small skin nick was made with an 11 blade scalpel. A 21 gauge Accustick needle was then advanced under direct sonographic guidance into a posterior lower pole calyx. A 0.018 inch wire was advanced under fluoroscopic guidance into the left renal collecting system. The Accustick sheath was then advanced over the wire and a 0.018 system exchanged for a 0.035 system. Gentle hand injection of contrast material confirms placement of the sheath within the renal collecting system. There is there is a marked hydronephrosis and proximal ureteral nephrosis. The double-J stent is located within the upper pole infundibulum. The tract from the scan into the renal collecting system was then dilated serially to 10-French. A 10-French Cook all-purpose drain was then placed and positioned under fluoroscopic guidance. The locking loop is well formed within the left renal pelvis. The catheter was secured to the skin with 2-0 Prolene and a sterile bandage was placed. Catheter was left to gravity bag drainage.  COMPLICATIONS: No immediate complications. The patient tolerated the procedure well and was returned to the floor in stable condition.  FINDINGS: Advanced hydro and proximal ureteral nephrosis secondary to distal obstruction. The double-J stent is well positioned.  IMPRESSION: Successful placement of a left 10 French percutaneous nephrostomy tube.  Hematuria should clear over 24-48 hrs. Monitor renal function. If creatinine improvement is insufficient, an additional right-sided nephrostomy tube could be considered in the future.  Signed,  Criselda Peaches, MD  [facility]  [facility] Radiology   Electronically Signed   By: Jacqulynn Cadet M.D.   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 She has had bilateral double-J ureteral stents placed in the past with initial improvement in renal function followed by recurrent degradation. There ureteral stents were upsized resulting in a similar clinical course.  Currently, the patient has bilateral hydronephrosis and progressive renal deterioration. Additionally, there is a left ureteral stone. We will proceed with placement of a single left percutaneous nephrostomy tube and monitor the patient's creatinine. If her creatinine decreases and/or stabilizes that may be all that is required. If her creatinine continues to deteriorate, a right sided percutaneous nephrostomy tube could be considered in the future.  ANESTHESIA/SEDATION: Versed 2 mg IV; Fentanyl 50 mcg IV  Total Moderate Sedation Time  10 minutes.  CONTRAST:  68mL OMNIPAQUE IOHEXOL 300 MG/ML  SOLN  MEDICATIONS: 2 g Ancef administered intravenously  FLUOROSCOPY TIME:  1 minutes 12 seconds.  TECHNIQUE: The procedure, risks, benefits, and alternatives were explained to the patient. Questions regarding the procedure were encouraged and answered. The patient understands and consents to the procedure.  The left flank was prepped with chlorhexidine in a sterile fashion, and a sterile drape was applied covering the operative field. A sterile gown and sterile gloves were used for the procedure. Local anesthesia was provided with 1% Lidocaine.  The left flank was interrogated with ultrasound and the left kidney identified. The kidney is hydronephrotic. A suitable access site on the skin overlying the lower pole, posterior calix was identified. After local mg anesthesia was achieved, a small skin nick was made with an 11 blade  scalpel. A 21 gauge Accustick needle was then advanced under direct sonographic guidance into a posterior lower pole calyx. A 0.018 inch wire was advanced under fluoroscopic guidance into the left renal collecting system. The Accustick sheath was then advanced over the wire and a 0.018 system exchanged for a 0.035 system. Gentle hand injection of contrast material confirms placement of the sheath within the renal collecting system. There is there is a marked hydronephrosis and proximal ureteral nephrosis. The double-J stent is located within the upper pole infundibulum. The tract from the scan into the renal collecting system was then dilated serially to 10-French. A 10-French Cook all-purpose drain was then placed and positioned under fluoroscopic guidance. The locking loop is well formed within the left renal pelvis. The catheter was secured to the skin with 2-0 Prolene and a sterile bandage was placed. Catheter was left to gravity bag drainage.  COMPLICATIONS: No immediate complications. The patient tolerated the procedure well and was returned to the floor in stable condition.  FINDINGS: Advanced hydro and proximal ureteral nephrosis secondary to distal obstruction. The double-J stent is well positioned.  IMPRESSION: Successful placement of a left 10 French percutaneous nephrostomy tube.  Hematuria should clear over 24-48 hrs. Monitor renal function. If creatinine improvement is insufficient, an additional right-sided nephrostomy tube could be considered in the future.  Signed,  Criselda Peaches, MD  [facility]  [facility]   Electronically Signed   By: Jacqulynn Cadet M.D.   On: 05/31/2014 17:39   Dg Chest Port 1 View  05/30/2014   CLINICAL DATA:  Metastatic abdominal carcinoma.  Hypertension.  EXAM: PORTABLE CHEST - 1 VIEW  COMPARISON:  02/06/2013  FINDINGS: Heart, mediastinum hila are unremarkable. Right anterior chest wall power Port-A-Cath has its  catheter tip in the lower superior vena cava, stable.  There is blunting of the right hemidiaphragm, new from the prior study. This is likely due to a small effusion with atelectasis. No left pleural effusion. Lungs are otherwise clear. No pneumothorax.  Bony thorax is demineralized but grossly intact.  IMPRESSION: 1. No acute cardiopulmonary disease. 2. Small pleural effusion with atelectasis.   Electronically Signed   By: Lajean Manes M.D.   On: 05/30/2014 11:40   Dg Abd 2 Views  05/30/2014   CLINICAL DATA:  Metastatic abdominal carcinoid.  EXAM: ABDOMEN - 2 VIEW  COMPARISON:  05/30/2014 CT.  04/08/2014 CT.  FINDINGS: Percutaneous gastrostomy and bilateral double-J ureteral stents are present. There is tortuosity of the right double-J ureteral stent. The bowel gas pattern is nonobstructive. Bowel gas is present within the rectum. The abdomen is relatively gasless. There is no plain film evidence of free air. Right-sided decubitus was obtained.  IMPRESSION: 1. Nonobstructive bowel gas pattern. 2. Bilateral double-J ureteral stents and percutaneous gastrostomy. 3. No  acute abnormality on plain film.   Electronically Signed   By: Dereck Ligas M.D.   On: 05/30/2014 12:25    Medications:  Prior to Admission:  Prescriptions prior to admission  Medication Sig Dispense Refill  . amLODipine (NORVASC) 5 MG tablet Take 1 tablet (5 mg total) by mouth daily.  30 tablet  0  . antiseptic oral rinse (BIOTENE) LIQD 15 mLs by Mouth Rinse route as needed (for dry mouth).      . fat emulsion 20 % infusion Inject 100 mLs into the vein 2 (two) times a week. Wed and Sat      . ondansetron (ZOFRAN) 4 MG/2ML SOLN injection Inject 4 mg into the vein every 6 (six) hours as needed for nausea or vomiting.      Marland Kitchen oxyCODONE (ROXICODONE INTENSOL) 100 MG/5ML concentrated solution Take 0.3-0.5 mLs (6-10 mg total) by mouth every 4 (four) hours as needed for pain.  30 mL  0  . promethazine (PHENERGAN) 25 MG/ML injection Inject  12.5 mg into the vein every 6 (six) hours as needed for nausea or vomiting.      . TPN ADULT Inject 2,000 mLs into the vein continuous. 14 hours/day start between 2000-2100 Five days a week With MVI       Scheduled: . amLODipine  5 mg Oral Daily  . sodium chloride  10-40 mL Intracatheter Q12H   Continuous: . dextrose 5 % and 0.45% NaCl 35 mL/hr at 06/01/14 0641  . TPN (CLINIMIX) Adult without lytes 40 mL/hr at 05/31/14 1847   And  . fat emulsion 250 mL (05/31/14 1847)   ULA:GTXMIWOEHO oral rinse, hydrALAZINE, LORazepam, morphine injection, ondansetron, oxyCODONE, promethazine, sodium chloride  Assessment/Plan: Pleasant female with metastatic carcinoid tumor presenting with acute on chronic renal failure secondary to bilateral ureteral obstruction. She has been seen by hematology, nephrology and interventional radiology, She is now status post nephrostomy tube, she's feeling better. She's also received blood transfusion and hemoglobin remains stable. We will continue supportive measures as outlined.  LOS: 2 days   Majid Mccravy D 06/01/2014, 11:02 AM

## 2014-06-02 LAB — RENAL FUNCTION PANEL
Albumin: 1.9 g/dL — ABNORMAL LOW (ref 3.5–5.2)
BUN: 57 mg/dL — ABNORMAL HIGH (ref 6–23)
CO2: 24 meq/L (ref 19–32)
Calcium: 9.2 mg/dL (ref 8.4–10.5)
Chloride: 101 meq/L (ref 96–112)
Creatinine, Ser: 3.84 mg/dL — ABNORMAL HIGH (ref 0.50–1.10)
GFR calc Af Amer: 13 mL/min — ABNORMAL LOW
GFR calc non Af Amer: 11 mL/min — ABNORMAL LOW
Glucose, Bld: 173 mg/dL — ABNORMAL HIGH (ref 70–99)
Phosphorus: 2.3 mg/dL (ref 2.3–4.6)
Potassium: 2.8 meq/L — CL (ref 3.7–5.3)
Sodium: 139 meq/L (ref 137–147)

## 2014-06-02 LAB — TYPE AND SCREEN
ABO/RH(D): A POS
Antibody Screen: NEGATIVE
Unit division: 0
Unit division: 0

## 2014-06-02 MED ORDER — TRACE MINERALS CR-CU-F-FE-I-MN-MO-SE-ZN IV SOLN
INTRAVENOUS | Status: DC
  Administered 2014-06-02 (×2): via INTRAVENOUS
  Filled 2014-06-02: qty 1000

## 2014-06-02 MED ORDER — FAT EMULSION 20 % IV EMUL
17.0000 mL | INTRAVENOUS | Status: DC
  Administered 2014-06-02: 17 mL via INTRAVENOUS
  Filled 2014-06-02: qty 100

## 2014-06-02 MED ORDER — POTASSIUM CHLORIDE 10 MEQ/100ML IV SOLN
10.0000 meq | INTRAVENOUS | Status: AC
  Administered 2014-06-02 (×3): 10 meq via INTRAVENOUS
  Filled 2014-06-02 (×3): qty 100

## 2014-06-02 NOTE — Progress Notes (Signed)
Subjective: Patient doing well, slept good, still trying to eat some. Potassium was low this morning. Electrolytes have been repleted. Nephrology input appreciated  Objective: Vital signs in last 24 hours: Temp:  [97.5 F (36.4 C)-98 F (36.7 C)] 97.6 F (36.4 C) (06/14 0617) Pulse Rate:  [73-80] 79 (06/14 0617) Resp:  [16] 16 (06/14 0617) BP: (163-171)/(69-81) 171/72 mmHg (06/14 0617) SpO2:  [98 %-100 %] 98 % (06/14 0617) Weight change:  Last BM Date: 05/30/14  Intake/Output from previous day: 06/13 0701 - 06/14 0700 In: 990 [P.O.:240; TPN:600] Out: 6300 [Urine:3600; Drains:2700] Intake/Output this shift: Total I/O In: -  Out: 250 [Urine:250]  General appearance: alert and cooperative Resp: clear to auscultation bilaterally Cardio: regular rate and rhythm GI: Positive bowel sounds, soft  Lab Results:  Results for orders placed during the hospital encounter of 05/30/14 (from the past 24 hour(s))  RENAL FUNCTION PANEL     Status: Abnormal   Collection Time    06/02/14  5:50 AM      Result Value Ref Range   Sodium 139  137 - 147 mEq/L   Potassium 2.8 (*) 3.7 - 5.3 mEq/L   Chloride 101  96 - 112 mEq/L   CO2 24  19 - 32 mEq/L   Glucose, Bld 173 (*) 70 - 99 mg/dL   BUN 57 (*) 6 - 23 mg/dL   Creatinine, Ser 3.84 (*) 0.50 - 1.10 mg/dL   Calcium 9.2  8.4 - 10.5 mg/dL   Phosphorus 2.3  2.3 - 4.6 mg/dL   Albumin 1.9 (*) 3.5 - 5.2 g/dL   GFR calc non Af Amer 11 (*) >90 mL/min   GFR calc Af Amer 13 (*) >90 mL/min      Studies/Results: Ir Perc Nephrostomy Left  05/31/2014   EXAM: IR ULTRASOUND GUIDANCE; PERC NEPHROSTOMY*L*  COMPARISON:  CT abdomen/ pelvis 05/30/2014  INDICATION: 68 year old female with 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 She has had bilateral double-J ureteral stents placed in the past with initial improvement in renal function followed by recurrent degradation. There ureteral stents were  upsized resulting in a similar clinical course.  Currently, the patient has bilateral hydronephrosis and progressive renal deterioration. Additionally, there is a left ureteral stone. We will proceed with placement of a single left percutaneous nephrostomy tube and monitor the patient's creatinine. If her creatinine decreases and/or stabilizes that may be all that is required. If her creatinine continues to deteriorate, a right sided percutaneous nephrostomy tube could be considered in the future.  ANESTHESIA/SEDATION: Versed 2 mg IV; Fentanyl 50 mcg IV  Total Moderate Sedation Time  10 minutes.  CONTRAST:  58mL OMNIPAQUE IOHEXOL 300 MG/ML  SOLN  MEDICATIONS: 2 g Ancef administered intravenously  FLUOROSCOPY TIME:  1 minutes 12 seconds.  TECHNIQUE: The procedure, risks, benefits, and alternatives were explained to the patient. Questions regarding the procedure were encouraged and answered. The patient understands and consents to the procedure.  The left flank was prepped with chlorhexidine in a sterile fashion, and a sterile drape was applied covering the operative field. A sterile gown and sterile gloves were used for the procedure. Local anesthesia was provided with 1% Lidocaine.  The left flank was interrogated with ultrasound and the left kidney identified. The kidney is hydronephrotic. A suitable access site on the skin overlying the lower pole, posterior calix was identified. After local mg anesthesia was achieved, a small skin nick was made with an 11 blade scalpel. A 21 gauge  Accustick needle was then advanced under direct sonographic guidance into a posterior lower pole calyx. A 0.018 inch wire was advanced under fluoroscopic guidance into the left renal collecting system. The Accustick sheath was then advanced over the wire and a 0.018 system exchanged for a 0.035 system. Gentle hand injection of contrast material confirms placement of the sheath within the renal collecting system. There is there is a  marked hydronephrosis and proximal ureteral nephrosis. The double-J stent is located within the upper pole infundibulum. The tract from the scan into the renal collecting system was then dilated serially to 10-French. A 10-French Cook all-purpose drain was then placed and positioned under fluoroscopic guidance. The locking loop is well formed within the left renal pelvis. The catheter was secured to the skin with 2-0 Prolene and a sterile bandage was placed. Catheter was left to gravity bag drainage.  COMPLICATIONS: No immediate complications. The patient tolerated the procedure well and was returned to the floor in stable condition.  FINDINGS: Advanced hydro and proximal ureteral nephrosis secondary to distal obstruction. The double-J stent is well positioned.  IMPRESSION: Successful placement of a left 10 French percutaneous nephrostomy tube.  Hematuria should clear over 24-48 hrs. Monitor renal function. If creatinine improvement is insufficient, an additional right-sided nephrostomy tube could be considered in the future.  Signed,  Criselda Peaches, MD  [facility]  [facility]   Electronically Signed   By: Jacqulynn Cadet M.D.   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 She has had bilateral double-J ureteral stents placed in the past with initial improvement in renal function followed by recurrent degradation. There ureteral stents were upsized resulting in a similar clinical course.  Currently, the patient has bilateral hydronephrosis and progressive renal deterioration. Additionally, there is a left ureteral stone. We will proceed with placement of a single left percutaneous nephrostomy tube and  monitor the patient's creatinine. If her creatinine decreases and/or stabilizes that may be all that is required. If her creatinine continues to deteriorate, a right sided percutaneous nephrostomy tube could be considered in the future.  ANESTHESIA/SEDATION: Versed 2 mg IV; Fentanyl 50 mcg IV  Total Moderate Sedation Time  10 minutes.  CONTRAST:  32mL OMNIPAQUE IOHEXOL 300 MG/ML  SOLN  MEDICATIONS: 2 g Ancef administered intravenously  FLUOROSCOPY TIME:  1 minutes 12 seconds.  TECHNIQUE: The procedure, risks, benefits, and alternatives were explained to the patient. Questions regarding the procedure were encouraged and answered. The patient understands and consents to the procedure.  The left flank was prepped with chlorhexidine in a sterile fashion, and a sterile drape was applied covering the operative field. A sterile gown and sterile gloves were used for the procedure. Local anesthesia was provided with 1% Lidocaine.  The left flank was interrogated with ultrasound and the left kidney identified. The kidney is hydronephrotic. A suitable access site on the skin overlying the lower pole, posterior calix was identified. After local mg anesthesia was achieved, a small skin nick was made with an 11 blade scalpel. A 21 gauge Accustick needle was then advanced under direct sonographic guidance into a posterior lower pole calyx. A 0.018 inch wire was advanced under fluoroscopic guidance into the left renal collecting system. The Accustick sheath was then advanced over the wire  and a 0.018 system exchanged for a 0.035 system. Gentle hand injection of contrast material confirms placement of the sheath within the renal collecting system. There is there is a marked hydronephrosis and proximal ureteral nephrosis. The double-J stent is located within the upper pole infundibulum. The tract from the scan into the renal collecting system was then dilated serially to 10-French. A 10-French Cook all-purpose drain was then placed  and positioned under fluoroscopic guidance. The locking loop is well formed within the left renal pelvis. The catheter was secured to the skin with 2-0 Prolene and a sterile bandage was placed. Catheter was left to gravity bag drainage.  COMPLICATIONS: No immediate complications. The patient tolerated the procedure well and was returned to the floor in stable condition.  FINDINGS: Advanced hydro and proximal ureteral nephrosis secondary to distal obstruction. The double-J stent is well positioned.  IMPRESSION: Successful placement of a left 10 French percutaneous nephrostomy tube.  Hematuria should clear over 24-48 hrs. Monitor renal function. If creatinine improvement is insufficient, an additional right-sided nephrostomy tube could be considered in the future.  Signed,  Criselda Peaches, MD  [facility]  [facility] Rad[facility]   Electronically Signed   By: Jacqulynn Cadet M.D.   On: 05/31/2014 17:39    Medications:  Prior to Admission:  Prescriptions prior to admission  Medication Sig Dispense Refill  . amLODipine (NORVASC) 5 MG tablet Take 1 tablet (5 mg total) by mouth daily.  30 tablet  0  . antiseptic oral rinse (BIOTENE) LIQD 15 mLs by Mouth Rinse route as needed (for dry mouth).      . fat emulsion 20 % infusion Inject 100 mLs into the vein 2 (two) times a week. Wed and Sat      . ondansetron (ZOFRAN) 4 MG/2ML SOLN injection Inject 4 mg into the vein every 6 (six) hours as needed for nausea or vomiting.      Marland Kitchen oxyCODONE (ROXICODONE INTENSOL) 100 MG/5ML concentrated solution Take 0.3-0.5 mLs (6-10 mg total) by mouth every 4 (four) hours as needed for pain.  30 mL  0  . promethazine (PHENERGAN) 25 MG/ML injection Inject 12.5 mg into the vein every 6 (six) hours as needed for nausea or vomiting.      . TPN ADULT Inject 2,000 mLs into the vein continuous. 14 hours/day start between 2000-2100 Five days a week With MVI       Scheduled: . amLODipine  5 mg  Oral Daily  . sodium chloride  10-40 mL Intracatheter Q12H   Continuous: . dextrose 5 % and 0.45% NaCl 100 mL/hr at 06/02/14 0757  . Marland KitchenTPN (CLINIMIX-E) Adult     And  . fat emulsion    . TPN (CLINIMIX) Adult without lytes 75 mL/hr at 06/01/14 1847   And  . fat emulsion 250 mL (06/01/14 1740)   JME:[id number] oral rinse, hydrALAZINE, LORazepam, morphine injection, ondansetron, oxyCODONE, promethazine, sodium chloride  Assessment/Plan: Pleasant female with metastatic carcinoid tumor presenting with acute on chronic renal failure secondary to bilateral ureteral obstruction. She has been seen by hematology, nephrology and interventional radiology, She is now status post nephrostomy tube, she's feeling better. She's also received blood transfusion and hemoglobin remains stable. We will continue supportive measures as outlined. Hypokalemia, repleted, check followup labs in the morning Anemia, improve status post transfusion.   LOS: 3 days   Jakhai Fant D 06/02/2014, 11:09 AM

## 2014-06-02 NOTE — Progress Notes (Signed)
PARENTERAL NUTRITION CONSULT NOTE - Follow Up  Pharmacy Consult for TNA Indication: Bowel obstruction  No Known Allergies  Patient Measurements: Height: 5' 3.5" (161.3 cm) Weight: 135 lb 9.3 oz (61.5 kg) IBW/kg (Calculated) : 53.55 Weight: 59.6 kg Height: 63 in Ideal Body Weight: 52.4 kg   Vital Signs: Temp: 97.6 F (36.4 C) (06/14 0617) Temp src: Oral (06/14 0617) BP: 171/72 mmHg (06/14 0617) Pulse Rate: 79 (06/14 0617) Intake/Output from previous day: 06/13 0701 - 06/14 0700 In: 990 [P.O.:240; TPN:600] Out: 6300 [Urine:3600; Drains:2700] Intake/Output from this shift:    Labs:  Recent Labs  05/30/14 1145 05/31/14 0501 05/31/14 0950 06/01/14 0351  WBC 6.5 4.1  --  3.9*  HGB 7.7* 6.6*  --  9.6*  HCT 23.8* 20.5*  --  29.2*  PLT 324 213  --  193  APTT  --   --  31  --   INR  --   --  1.01  --      Recent Labs  05/30/14 1145 05/31/14 0501 06/01/14 0351 06/02/14 0550  NA 147 146 143 139  K 4.4 3.9 3.5* 2.8*  CL 110 113* 110 101  CO2 19 20 21 24   GLUCOSE 125* 117* 149* 173*  BUN 66* 60* 55* 57*  CREATININE 4.60* 4.78* 4.69* 3.84*  CALCIUM 11.8* 10.6* 10.0 9.2  MG 2.2 1.9  --   --   PHOS 5.0* 4.9* 4.0 2.3  PROT 6.9 6.2  --   --   ALBUMIN 2.3* 2.0*  --  1.9*  AST 19 20  --   --   ALT 13 13  --   --   ALKPHOS 222* 206*  --   --   BILITOT 0.4 0.2*  --   --   PREALBUMIN  --  20.2  --   --   TRIG  --  109  --   --    Estimated Creatinine Clearance: 12 ml/min (by C-G formula based on Cr of 3.84).   No results found for this basename: GLUCAP,  in the last 72 hours  Medical History: Past Medical History  Diagnosis Date  . Complication of anesthesia     slow to wake up  . Blood transfusion without reported diagnosis 2009 and 2011  . Hypertension     off all bp meds since Dec 20, 2012  . Chronic kidney disease 12-19-2012    elevated bun and creatinine   . Cancer 02/19/2008    Metastatic carcinoid tumor    Medications:  Scheduled:  . amLODipine   5 mg Oral Daily  . potassium chloride  10 mEq Intravenous Q1 Hr x 3  . sodium chloride  10-40 mL Intracatheter Q12H   Infusions:  . dextrose 5 % and 0.45% NaCl    . Marland KitchenTPN (CLINIMIX-E) Adult     And  . fat emulsion    . TPN (CLINIMIX) Adult without lytes 75 mL/hr at 06/01/14 1847   And  . fat emulsion 250 mL (06/01/14 1740)   PRN: antiseptic oral rinse, hydrALAZINE, LORazepam, morphine injection, ondansetron, oxyCODONE, promethazine, sodium chloride  Insulin Requirements in the past 24 hours:  None   Current Nutrition:  Per [facility] records:  Cycle TNA over 14 hours daily which provides 50 grams protein, 1192 kcal per day.  Additives per 2L bag: Sodium Phosphate 30mmol; Sodium Chloride 27meq; Potassium Chloride 58meq; Calcium Gluconate 93meq; Hyperlyte CR 99ml = 71meqK, 32meq Na, 35meq Ca, 90meq Mg; MVI 75ml; Addamel  N 50ml; Famotidine 40mg ; no insulin.   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 She has a gastrostomy tube in place for decompression and is chronic TNA at home. Presents from home with worsening abdominal pain, nausea, vomiting and fever for the past 3 days. Patient with Acute on CKD secondary to bil ureteral obstruction from tumor, s/p lt. Percutaneous nephrostomy tube 6/12, now with good UOP. Patient is being admitted 6/11 for palliative care consultation and pain management. Pharmacy is consulted to continue TNA on admission.  Glucose: serum glucose 140-170s in past 24h Electrolytes: corrected calcium decreasing but remains elevated 10.8 with albumin 1.9; K low at 2.8 (repleted with IV K); Na, Phos decreasing to low of normal.  LFTs: AlkPhos elevated but consistent with previous results, AST/ALT and TBili wnl (6/12) Renal function: SCr high but trending down TGs: 109 (6/12) Prealbumin: 20.2 (6/12)  Nutritional Goals:  RD: Continue per home  TPN regimen to provide 1192 kcal and 50g protein.   TPN Access: implanted port   Plan: At 1800 today:  Resume TPN with electrolytes today: Clinimix E 5/15 to provide 50g protein, 1190 kcal. Incuse 1L over 14 hours. 61ml/hr ramp up and down, 75ml/hr x 12hrs. 20% fat emulsion at 15ml/hr for 14hrs.  TNA to contain standard multivitamins and trace elements daily  Reduce IVF to [facility] while TNA infusing, keep 157ml/hr per MD when TNA is off.  Check CBGs 4 times daily (2hr after TNA starts, 1hr after stops, once during TNA infusion and once off TNA) .  TNA lab panels on Mondays & Thursdays.  F/u daily.   Kizzie Furnish, PharmD Pager: [phone] 06/02/2014 7:47 AM

## 2014-06-02 NOTE — Progress Notes (Signed)
CRITICAL VALUE ALERT  Critical value received:  K+ 2.8  Date of notification:  06/02/14  Time of notification:  0640  Critical value read back:yes  Nurse who received alert:  Harlow Asa RN  MD notified (1st page):  Hospitalist on call T. Rogue Bussing  Time of first page:  0645  MD notified (2nd page):  Time of second page:  Responding MD:  Hospitalist on call T. Rogue Bussing  Time MD responded:  [phone]

## 2014-06-02 NOTE — Progress Notes (Signed)
S: No new CO except for some gross hematuria in perc bag O:BP 171/72  Pulse 79  Temp(Src) 97.6 F (36.4 C) (Oral)  Resp 16  Ht 5' 3.5" (1.613 m)  Wt 61.5 kg (135 lb 9.3 oz)  BMI 23.64 kg/m2  SpO2 98%  Intake/Output Summary (Last 24 hours) at 06/02/14 1101 Last data filed at 06/02/14 0900  Gross per 24 hour  Intake    990 ml  Output   5780 ml  Net  -4790 ml   Weight change:  MVH:QIONG and alert CVS:RRR Resp:clear Abd:G-tube.  LLQ colostomy.  Lt perc passing bloody urine Ext: tr edema rt 2-3+ edema Lt NEURO:CNI Ox3 no asterixis   . amLODipine  5 mg Oral Daily  . 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 She has had bilateral double-J ureteral stents placed in the past with initial improvement in renal function followed by recurrent degradation. There ureteral stents were upsized resulting in a similar clinical course.  Currently, the patient has bilateral hydronephrosis and progressive renal deterioration. Additionally, there is a left ureteral stone. We will proceed with placement of a single left percutaneous nephrostomy tube and monitor the patient's creatinine. If her creatinine decreases and/or stabilizes that may be all that is required. If her creatinine continues to deteriorate, a right sided percutaneous nephrostomy tube could be considered in the future.  ANESTHESIA/SEDATION: Versed 2 mg IV; Fentanyl 50 mcg IV  Total Moderate Sedation Time  10 minutes.  CONTRAST:  65m OMNIPAQUE IOHEXOL 300 MG/ML  SOLN  MEDICATIONS: 2 g Ancef administered intravenously  FLUOROSCOPY TIME:  1 minutes 12 seconds.  TECHNIQUE: The procedure, risks, benefits, and alternatives were explained to the patient. Questions  regarding the procedure were encouraged and answered. The patient understands and consents to the procedure.  The left flank was prepped with chlorhexidine in a sterile fashion, and a sterile drape was applied covering the operative field. A sterile gown and sterile gloves were used for the procedure. Local anesthesia was provided with 1% Lidocaine.  The left flank was interrogated with ultrasound and the left kidney identified. The kidney is hydronephrotic. A suitable access site on the skin overlying the lower pole, posterior calix was identified. After local mg anesthesia was achieved, a small skin nick was made with an 11 blade scalpel. A 21 gauge Accustick needle was then advanced under direct sonographic guidance into a posterior lower pole calyx. A 0.018 inch wire was advanced under fluoroscopic guidance into the left renal collecting system. The Accustick sheath was then advanced over the wire and a 0.018 system exchanged for a 0.035 system. Gentle hand injection of contrast material confirms placement of the sheath within the renal collecting system. There is there is a marked hydronephrosis and proximal ureteral nephrosis. The double-J stent is located within the upper pole infundibulum. The tract from the scan into the renal collecting system was then dilated serially to 10-French. A 10-French Cook all-purpose drain was then placed and positioned under fluoroscopic guidance. The locking loop is well formed within the left renal pelvis. The catheter was secured to the skin with 2-0 Prolene and a sterile bandage was placed. Catheter was left to gravity bag drainage.  COMPLICATIONS: No immediate complications. The patient tolerated the procedure well and was returned  to the floor in stable condition.  FINDINGS: Advanced hydro and proximal ureteral nephrosis secondary to distal obstruction. The double-J stent is well positioned.  IMPRESSION: Successful placement of a left 10 French percutaneous nephrostomy  tube.  Hematuria should clear over 24-48 hrs. Monitor renal function. If creatinine improvement is insufficient, an additional right-sided nephrostomy tube could be considered in the future.  Signed,  Criselda Peaches, MD  [facility]  [facility]   Electronically Signed   By: Jacqulynn Cadet M.D.   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 She has had bilateral double-J ureteral stents placed in the past with initial improvement in renal function followed by recurrent degradation. There ureteral stents were upsized resulting in a similar clinical course.  Currently, the patient has bilateral hydronephrosis and progressive renal deterioration. Additionally, there is a left ureteral stone. We will proceed with placement of a single left percutaneous nephrostomy tube and monitor the patient's creatinine. If her creatinine decreases and/or stabilizes that may be all that is required. If her creatinine continues to deteriorate, a right sided percutaneous nephrostomy tube could be considered in the future.  ANESTHESIA/SEDATION: Versed 2 mg IV; Fentanyl 50 mcg IV  Total Moderate Sedation Time  10 minutes.  CONTRAST:  45m OMNIPAQUE IOHEXOL 300 MG/ML  SOLN  MEDICATIONS: 2 g Ancef administered intravenously  FLUOROSCOPY TIME:  1 minutes 12 seconds.  TECHNIQUE: The procedure, risks, benefits, and alternatives were explained to the patient. Questions regarding the procedure were encouraged and answered. The patient understands and consents to the procedure.  The left flank was prepped with chlorhexidine in a sterile fashion, and a sterile drape was applied covering the operative field. A sterile gown and sterile gloves  were used for the procedure. Local anesthesia was provided with 1% Lidocaine.  The left flank was interrogated with ultrasound and the left kidney identified. The kidney is hydronephrotic. A suitable access site on the skin overlying the lower pole, posterior calix was identified. After local mg anesthesia was achieved, a small skin nick was made with an 11 blade scalpel. A 21 gauge Accustick needle was then advanced under direct sonographic guidance into a posterior lower pole calyx. A 0.018 inch wire was advanced under fluoroscopic guidance into the left renal collecting system. The Accustick sheath was then advanced over the wire and a 0.018 system exchanged for a 0.035 system. Gentle hand injection of contrast material confirms placement of the sheath within the renal collecting system. There is there is a marked hydronephrosis and proximal ureteral nephrosis. The double-J stent is located within the upper pole infundibulum. The tract from the scan into the renal collecting system was then dilated serially to 10-French. A 10-French Cook all-purpose drain was then placed and positioned under fluoroscopic guidance. The locking loop is well formed within the left renal pelvis. The catheter was secured to the skin with 2-0 Prolene and a sterile bandage was placed. Catheter was left to gravity bag drainage.  COMPLICATIONS: No immediate complications. The patient tolerated the procedure well and was returned to the floor in stable condition.  FINDINGS: Advanced hydro and proximal ureteral nephrosis secondary to distal obstruction. The double-J stent is well positioned.  IMPRESSION: Successful placement of a left 10 French percutaneous nephrostomy tube.  Hematuria should  clear over 24-48 hrs. Monitor renal function. If creatinine improvement is insufficient, an additional right-sided nephrostomy tube could be considered in the future.  Signed,  Criselda Peaches, MD  [facility]   [facility]   Electronically Signed   By: Jacqulynn Cadet M.D.   On: 05/31/2014 17:39   BMET    Component Value Date/Time   NA 139 06/02/2014 0550   NA 142 05/29/2014 0900   NA 144 06/26/2012 1401   K 2.8* 06/02/2014 0550   K 4.4 05/29/2014 0900   K 5.1* 06/26/2012 1401   CL 101 06/02/2014 0550   CL 92* 01/26/2013 1322   CL 97* 06/26/2012 1401   CO2 24 06/02/2014 0550   CO2 22 05/29/2014 0900   CO2 29 06/26/2012 1401   GLUCOSE 173* 06/02/2014 0550   GLUCOSE 107 05/29/2014 0900   GLUCOSE 156* 01/26/2013 1322   GLUCOSE 131* 06/26/2012 1401   BUN 57* 06/02/2014 0550   BUN 64.0* 05/29/2014 0900   BUN 20 06/26/2012 1401   CREATININE 3.84* 06/02/2014 0550   CREATININE 4.6* 05/29/2014 0900   CREATININE 1.0 06/26/2012 1401   CALCIUM 9.2 06/02/2014 0550   CALCIUM 11.6* 05/29/2014 0900   CALCIUM 8.2 06/26/2012 1401   GFRNONAA 11* 06/02/2014 0550   GFRAA 13* 06/02/2014 0550   CBC    Component Value Date/Time   WBC 3.9* 06/01/2014 0351   WBC 7.4 05/28/2014 0931   RBC 3.28* 06/01/2014 0351   RBC 2.76* 05/28/2014 0931   HGB 9.6* 06/01/2014 0351   HGB 8.0* 05/28/2014 0931   HCT 29.2* 06/01/2014 0351   HCT 25.2* 05/28/2014 0931   PLT 193 06/01/2014 0351   PLT 307 05/28/2014 0931   MCV 89.0 06/01/2014 0351   MCV 91.4 05/28/2014 0931   MCH 29.3 06/01/2014 0351   MCH 29.0 05/28/2014 0931   MCHC 32.9 06/01/2014 0351   MCHC 31.7 05/28/2014 0931   RDW 15.7* 06/01/2014 0351   RDW 15.4* 05/28/2014 0931   LYMPHSABS 0.3* 05/31/2014 0501   LYMPHSABS 1.1 05/28/2014 0931   MONOABS 0.1 05/31/2014 0501   MONOABS 0.4 05/28/2014 0931   EOSABS 0.0 05/31/2014 0501   EOSABS 0.1 05/28/2014 0931   BASOSABS 0.0 05/31/2014 0501   BASOSABS 0.1 05/28/2014 0931     Assessment: 1. Acute on CKD sec bil ureteral obstruction from tumor SP Lt Perc nephrostomy tube.  Scr trending down. 2. Met carcinoid tumor 3. Hypercalcemia, sl improved  Plan: 1.  Resume usual TPN 2. Replace K 3. She can go from renal standpoint and the cancer center can follow her lytes  and Scr 4.  Will sign off, call if further renal issues  Jahaziel Francois T

## 2014-06-02 NOTE — Progress Notes (Signed)
Subjective: Pt doing well; no new c/o  Objective: Vital signs in last 24 hours: Temp:  [97.5 F (36.4 C)-98 F (36.7 C)] 97.6 F (36.4 C) (06/14 0617) Pulse Rate:  [73-80] 79 (06/14 0617) Resp:  [16] 16 (06/14 0617) BP: (163-171)/(69-81) 171/72 mmHg (06/14 0617) SpO2:  [98 %-100 %] 98 % (06/14 0617) Last BM Date: 05/30/14  Intake/Output from previous day: 06/13 0701 - 06/14 0700 In: 990 [P.O.:240; TPN:600] Out: 6300 [Urine:3600; Drains:2700] Intake/Output this shift: Total I/O In: -  Out: 250 [Urine:250]  Left PCN intact, output today 250+ cc's blood tinged urine, 3.4 liters yesterday; creat down to 3.84(4.69); PCN irrigated with 5 cc's sterile NS without difficulty  Lab Results:   Recent Labs  05/31/14 0501 06/01/14 0351  WBC 4.1 3.9*  HGB 6.6* 9.6*  HCT 20.5* 29.2*  PLT 213 193   BMET  Recent Labs  06/01/14 0351 06/02/14 0550  NA 143 139  K 3.5* 2.8*  CL 110 101  CO2 21 24  GLUCOSE 149* 173*  BUN 55* 57*  CREATININE 4.69* 3.84*  CALCIUM 10.0 9.2   PT/INR  Recent Labs  05/31/14 0950  LABPROT 13.1  INR 1.01   ABG No results found for this basename: PHART, PCO2, PO2, HCO3,  in the last 72 hours  Studies/Results: Ir Perc Nephrostomy Left  05/31/2014   EXAM: IR ULTRASOUND GUIDANCE; PERC NEPHROSTOMY*L*  COMPARISON:  CT abdomen/ pelvis 05/30/2014  INDICATION: 68 year old female with 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 She has had bilateral double-J ureteral stents placed in the past with initial improvement in renal function followed by recurrent degradation. There ureteral stents were upsized resulting in a similar clinical course.  Currently, the patient has bilateral hydronephrosis and progressive renal deterioration. Additionally, there is a left ureteral stone. We will proceed with placement of a single left percutaneous nephrostomy tube and monitor the patient's creatinine. If  her creatinine decreases and/or stabilizes that may be all that is required. If her creatinine continues to deteriorate, a right sided percutaneous nephrostomy tube could be considered in the future.  ANESTHESIA/SEDATION: Versed 2 mg IV; Fentanyl 50 mcg IV  Total Moderate Sedation Time  10 minutes.  CONTRAST:  89m OMNIPAQUE IOHEXOL 300 MG/ML  SOLN  MEDICATIONS: 2 g Ancef administered intravenously  FLUOROSCOPY TIME:  1 minutes 12 seconds.  TECHNIQUE: The procedure, risks, benefits, and alternatives were explained to the patient. Questions regarding the procedure were encouraged and answered. The patient understands and consents to the procedure.  The left flank was prepped with chlorhexidine in a sterile fashion, and a sterile drape was applied covering the operative field. A sterile gown and sterile gloves were used for the procedure. Local anesthesia was provided with 1% Lidocaine.  The left flank was interrogated with ultrasound and the left kidney identified. The kidney is hydronephrotic. A suitable access site on the skin overlying the lower pole, posterior calix was identified. After local mg anesthesia was achieved, a small skin nick was made with an 11 blade scalpel. A 21 gauge Accustick needle was then advanced under direct sonographic guidance into a posterior lower pole calyx. A 0.018 inch wire was advanced under fluoroscopic guidance into the left renal collecting system. The Accustick sheath was then advanced over the wire and a 0.018 system exchanged for a 0.035 system. Gentle hand injection of contrast material confirms placement of the sheath within the renal collecting system. There is there is a marked hydronephrosis and proximal ureteral  nephrosis. The double-J stent is located within the upper pole infundibulum. The tract from the scan into the renal collecting system was then dilated serially to 10-French. A 10-French Cook all-purpose drain was then placed and positioned under fluoroscopic  guidance. The locking loop is well formed within the left renal pelvis. The catheter was secured to the skin with 2-0 Prolene and a sterile bandage was placed. Catheter was left to gravity bag drainage.  COMPLICATIONS: No immediate complications. The patient tolerated the procedure well and was returned to the floor in stable condition.  FINDINGS: Advanced hydro and proximal ureteral nephrosis secondary to distal obstruction. The double-J stent is well positioned.  IMPRESSION: Successful placement of a left 10 French percutaneous nephrostomy tube.  Hematuria should clear over 24-48 hrs. Monitor renal function. If creatinine improvement is insufficient, an additional right-sided nephrostomy tube could be considered in the future.  Signed,  Criselda Peaches, MD  [facility]  [facility]   Electronically Signed   By: Jacqulynn Cadet M.D.   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 She has had bilateral double-J ureteral stents placed in the past with initial improvement in renal function followed by recurrent degradation. There ureteral stents were upsized resulting in a similar clinical course.  Currently, the patient has bilateral hydronephrosis and progressive renal deterioration. Additionally, there is a left ureteral stone. We will proceed with placement of a single left percutaneous nephrostomy tube and monitor the patient's creatinine. If her creatinine decreases and/or stabilizes that may be all that is required. If her creatinine continues to deteriorate, a right sided percutaneous nephrostomy tube could be considered in the future.  ANESTHESIA/SEDATION: Versed 2 mg IV; Fentanyl 50 mcg IV  Total  Moderate Sedation Time  10 minutes.  CONTRAST:  83m OMNIPAQUE IOHEXOL 300 MG/ML  SOLN  MEDICATIONS: 2 g Ancef administered intravenously  FLUOROSCOPY TIME:  1 minutes 12 seconds.  TECHNIQUE: The procedure, risks, benefits, and alternatives were explained to the patient. Questions regarding the procedure were encouraged and answered. The patient understands and consents to the procedure.  The left flank was prepped with chlorhexidine in a sterile fashion, and a sterile drape was applied covering the operative field. A sterile gown and sterile gloves were used for the procedure. Local anesthesia was provided with 1% Lidocaine.  The left flank was interrogated with ultrasound and the left kidney identified. The kidney is hydronephrotic. A suitable access site on the skin overlying the lower pole, posterior calix was identified. After local mg anesthesia was achieved, a small skin nick was made with an 11 blade scalpel. A 21 gauge Accustick needle was then advanced under direct sonographic guidance into a posterior lower pole calyx. A 0.018 inch wire was advanced under fluoroscopic guidance into the left renal collecting system. The Accustick sheath was then advanced over the wire and a 0.018 system exchanged for a 0.035 system. Gentle hand injection of contrast material confirms placement of the sheath within the renal collecting system. There is there is a marked hydronephrosis and proximal ureteral nephrosis. The double-J stent is located within the upper pole infundibulum. The tract from the scan into the renal collecting system was then dilated serially to 10-French. A 10-French Cook all-purpose drain was then placed and positioned under fluoroscopic  guidance. The locking loop is well formed within the left renal pelvis. The catheter was secured to the skin with 2-0 Prolene and a sterile bandage was placed. Catheter was left to gravity bag drainage.  COMPLICATIONS: No immediate complications. The patient tolerated  the procedure well and was returned to the floor in stable condition.  FINDINGS: Advanced hydro and proximal ureteral nephrosis secondary to distal obstruction. The double-J stent is well positioned.  IMPRESSION: Successful placement of a left 10 French percutaneous nephrostomy tube.  Hematuria should clear over 24-48 hrs. Monitor renal function. If creatinine improvement is insufficient, an additional right-sided nephrostomy tube could be considered in the future.  Signed,  Criselda Peaches, MD  [facility]  [facility]   Electronically Signed   By: Jacqulynn Cadet M.D.   On: 05/31/2014 17:39    Anti-infectives: Anti-infectives   Start     Dose/Rate Route Frequency Ordered Stop   05/31/14 1500  ceFAZolin (ANCEF) IVPB 2 g/50 mL premix     2 g 100 mL/hr over 30 Minutes Intravenous On call 05/31/14 1452 05/31/14 1636      Assessment/Plan: s/p left PCN 6/12 secondary to obstructive hydronephrosis from met carcinoid tumor; monitor renal fxn; K replaced; cont current care; PCN exchange should occur at 4-6 weeks.   LOS: 3 days    Eli Pattillo,D [facility] 06/02/2014

## 2014-06-03 ENCOUNTER — Encounter: Payer: Self-pay | Admitting: Oncology

## 2014-06-03 LAB — COMPREHENSIVE METABOLIC PANEL
ALBUMIN: 2 g/dL — AB (ref 3.5–5.2)
ALK PHOS: 277 U/L — AB (ref 39–117)
ALT: 11 U/L (ref 0–35)
AST: 28 U/L (ref 0–37)
BUN: 55 mg/dL — ABNORMAL HIGH (ref 6–23)
CO2: 27 mEq/L (ref 19–32)
Calcium: 8.9 mg/dL (ref 8.4–10.5)
Chloride: 97 mEq/L (ref 96–112)
Creatinine, Ser: 3.01 mg/dL — ABNORMAL HIGH (ref 0.50–1.10)
GFR calc non Af Amer: 15 mL/min — ABNORMAL LOW (ref >90)
GFR, EST AFRICAN AMERICAN: 17 mL/min — AB (ref >90)
Glucose, Bld: 148 mg/dL — ABNORMAL HIGH (ref 70–99)
POTASSIUM: 2.9 meq/L — AB (ref 3.7–5.3)
SODIUM: 137 meq/L (ref 137–147)
TOTAL PROTEIN: 6.3 g/dL (ref 6.0–8.3)
Total Bilirubin: 0.2 mg/dL — ABNORMAL LOW (ref 0.3–1.2)

## 2014-06-03 LAB — DIFFERENTIAL
Basophils Absolute: 0 10*3/uL (ref 0.0–0.1)
Basophils Relative: 1 % (ref 0–1)
Eosinophils Absolute: 0.1 10*3/uL (ref 0.0–0.7)
Eosinophils Relative: 2 % (ref 0–5)
LYMPHS PCT: 25 % (ref 12–46)
Lymphs Abs: 1.4 10*3/uL (ref 0.7–4.0)
MONOS PCT: 6 % (ref 3–12)
Monocytes Absolute: 0.4 10*3/uL (ref 0.1–1.0)
NEUTROS PCT: 66 % (ref 43–77)
Neutro Abs: 3.6 10*3/uL (ref 1.7–7.7)

## 2014-06-03 LAB — PREALBUMIN: Prealbumin: 21.6 mg/dL (ref 17.0–34.0)

## 2014-06-03 LAB — POTASSIUM: Potassium: 4 mEq/L (ref 3.7–5.3)

## 2014-06-03 LAB — TRIGLYCERIDES: TRIGLYCERIDES: 255 mg/dL — AB (ref <150)

## 2014-06-03 LAB — MAGNESIUM: MAGNESIUM: 1.3 mg/dL — AB (ref 1.5–2.5)

## 2014-06-03 LAB — CBC
HCT: 31.7 % — ABNORMAL LOW (ref 36.0–46.0)
Hemoglobin: 10.5 g/dL — ABNORMAL LOW (ref 12.0–15.0)
MCH: 28.9 pg (ref 26.0–34.0)
MCHC: 33.1 g/dL (ref 30.0–36.0)
MCV: 87.3 fL (ref 78.0–100.0)
PLATELETS: 187 10*3/uL (ref 150–400)
RBC: 3.63 MIL/uL — AB (ref 3.87–5.11)
RDW: 14.9 % (ref 11.5–15.5)
WBC: 5.5 10*3/uL (ref 4.0–10.5)

## 2014-06-03 LAB — PHOSPHORUS: Phosphorus: 2.9 mg/dL (ref 2.3–4.6)

## 2014-06-03 MED ORDER — FAT EMULSION 20 % IV EMUL
17.0000 mL | INTRAVENOUS | Status: DC
  Filled 2014-06-03: qty 100

## 2014-06-03 MED ORDER — POTASSIUM CHLORIDE CRYS ER 20 MEQ PO TBCR
30.0000 meq | EXTENDED_RELEASE_TABLET | Freq: Once | ORAL | Status: DC
  Filled 2014-06-03: qty 1

## 2014-06-03 MED ORDER — TRACE MINERALS CR-CU-F-FE-I-MN-MO-SE-ZN IV SOLN
INTRAVENOUS | Status: DC
  Filled 2014-06-03: qty 1000

## 2014-06-03 MED ORDER — HEPARIN SOD (PORK) LOCK FLUSH 100 UNIT/ML IV SOLN
500.0000 [IU] | INTRAVENOUS | Status: AC | PRN
  Administered 2014-06-03: 500 [IU]
  Filled 2014-06-03: qty 5

## 2014-06-03 MED ORDER — MAGNESIUM SULFATE 40 MG/ML IJ SOLN
2.0000 g | Freq: Once | INTRAMUSCULAR | Status: AC
  Administered 2014-06-03: 2 g via INTRAVENOUS
  Filled 2014-06-03: qty 50

## 2014-06-03 MED ORDER — POTASSIUM CHLORIDE 10 MEQ/100ML IV SOLN
10.0000 meq | INTRAVENOUS | Status: AC
  Administered 2014-06-03 (×4): 10 meq via INTRAVENOUS
  Filled 2014-06-03 (×4): qty 100

## 2014-06-03 NOTE — Progress Notes (Signed)
  Subjective: Pt doing ok; no new c/o  Objective: Vital signs in last 24 hours: Temp:  [97.2 F (36.2 C)-98.3 F (36.8 C)] 97.2 F (36.2 C) (06/15 0520) Pulse Rate:  [79-80] 80 (06/15 0520) Resp:  [14-16] 16 (06/15 0520) BP: (156-157)/(79-80) 156/80 mmHg (06/15 0520) SpO2:  [94 %-97 %] 94 % (06/15 0520) Last BM Date: 05/30/14  Intake/Output from previous day: 06/14 0701 - 06/15 0700 In: 6886 [P.O.:120; I.V.:905; TPN:250] Out: 3875 [Urine:2175; Drains:1700] Intake/Output this shift:    Left PCN intact, output 1925 cc's blood- tinged urine; insertion site ok,mildly temder; PCN irrigated  easily with sterile NS; creat down to 3.01(3.84)  Lab Results:   Recent Labs  06/01/14 0351 06/03/14 0355  WBC 3.9* 5.5  HGB 9.6* 10.5*  HCT 29.2* 31.7*  PLT 193 187   BMET  Recent Labs  06/02/14 0550 06/03/14 0355  NA 139 137  K 2.8* 2.9*  CL 101 97  CO2 24 27  GLUCOSE 173* 148*  BUN 57* 55*  CREATININE 3.84* 3.01*  CALCIUM 9.2 8.9   PT/INR No results found for this basename: LABPROT, INR,  in the last 72 hours ABG No results found for this basename: PHART, PCO2, PO2, HCO3,  in the last 72 hours  Studies/Results: No results found.  Anti-infectives: Anti-infectives   Start     Dose/Rate Route Frequency Ordered Stop   05/31/14 1500  ceFAZolin (ANCEF) IVPB 2 g/50 mL premix     2 g 100 mL/hr over 30 Minutes Intravenous On call 05/31/14 1452 05/31/14 1636      Assessment/Plan: S/p left PCN 6/12 secondary to obstructive hydronephrosis from met carcinoid tumor; replace K; cont current care; PCN exchange should occur at 4-6 weeks.    LOS: 4 days    ALLRED,D [facility] 06/03/2014

## 2014-06-03 NOTE — Progress Notes (Signed)
Subjective: Feels better  Objective: Vital signs in last 24 hours: Temp:  [97.2 F (36.2 C)-98.3 F (36.8 C)] 97.2 F (36.2 C) (06/15 0520) Pulse Rate:  [79-88] 80 (06/15 0520) Resp:  [14-16] 16 (06/15 0520) BP: (156-173)/(70-80) 156/80 mmHg (06/15 0520) SpO2:  [94 %-98 %] 94 % (06/15 0520) Weight change:  Last BM Date: 05/30/14  Intake/Output from previous day: 06/14 0701 - 06/15 0700 In: 1275 [P.O.:120; I.V.:905; TPN:250] Out: 3875 [Urine:2175; Drains:1700] Intake/Output this shift: Total I/O In: 370 [P.O.:120; TPN:250] Out: 2225 [Urine:1525; Drains:700]  General appearance: alert and cooperative  Lab Results:  Recent Labs  06/01/14 0351 06/03/14 0355  WBC 3.9* 5.5  HGB 9.6* 10.5*  HCT 29.2* 31.7*  PLT 193 187   BMET  Recent Labs  06/02/14 0550 06/03/14 0355  NA 139 137  K 2.8* 2.9*  CL 101 97  CO2 24 27  GLUCOSE 173* 148*  BUN 57* 55*  CREATININE 3.84* 3.01*  CALCIUM 9.2 8.9    Studies/Results: No results found.  Medications: I have reviewed the patient's current medications.  Assessment/Plan: 1. metastatic carcinoid tumor presenting with acute on chronic renal failure secondary to bilateral ureteral obstruction. She has been seen by hematology, nephrology and interventional radiology, She is now status post nephrostomy tube. Creatinine is improving 2. Anemia hgb stable after transfusion 3. Hypokalemia, still low. Give 4 runs potassium and recheck, hope to discharge later today     LOS: 4 days   Lori Dennis JOSEPH 06/03/2014, 6:19 AM

## 2014-06-03 NOTE — Progress Notes (Signed)
PARENTERAL NUTRITION CONSULT NOTE - Follow Up  Pharmacy Consult for TNA Indication: Bowel obstruction  No Known Allergies  Patient Measurements: Height: 5' 3.5" (161.3 cm) Weight: 135 lb 9.3 oz (61.5 kg) IBW/kg (Calculated) : 53.55 Weight: 59.6 kg Height: 63 in Ideal Body Weight: 52.4 kg   Vital Signs: Temp: 97.2 F (36.2 C) (06/15 0520) Temp src: Oral (06/15 0520) BP: 156/80 mmHg (06/15 0520) Pulse Rate: 80 (06/15 0520) Intake/Output from previous day: 06/14 0701 - 06/15 0700 In: 4944 [P.O.:120; I.V.:905; TPN:250] Out: 3875 [Urine:2175; Drains:1700] Intake/Output from this shift:    Labs:  Recent Labs  06/01/14 0351 06/03/14 0355  WBC 3.9* 5.5  HGB 9.6* 10.5*  HCT 29.2* 31.7*  PLT 193 187     Recent Labs  06/01/14 0351 06/02/14 0550 06/03/14 0355  NA 143 139 137  K 3.5* 2.8* 2.9*  CL 110 101 97  CO2 21 24 27   GLUCOSE 149* 173* 148*  BUN 55* 57* 55*  CREATININE 4.69* 3.84* 3.01*  CALCIUM 10.0 9.2 8.9  MG  --   --  1.3*  PHOS 4.0 2.3 2.9  PROT  --   --  6.3  ALBUMIN  --  1.9* 2.0*  AST  --   --  28  ALT  --   --  11  ALKPHOS  --   --  277*  BILITOT  --   --  0.2*  TRIG  --   --  255*   Estimated Creatinine Clearance: 15.3 ml/min (by C-G formula based on Cr of 3.01).   No results found for this basename: GLUCAP,  in the last 72 hours  Medical History: Past Medical History  Diagnosis Date  . Complication of anesthesia     slow to wake up  . Blood transfusion without reported diagnosis 2009 and 2011  . Hypertension     off all bp meds since Dec 20, 2012  . Chronic kidney disease 12-19-2012    elevated bun and creatinine   . Cancer 02/19/2008    Metastatic carcinoid tumor    Medications:  Scheduled:  . amLODipine  5 mg Oral Daily  . potassium chloride  30 mEq Oral Once  . potassium chloride  10 mEq Intravenous Q1 Hr x 4  . sodium chloride  10-40 mL Intracatheter Q12H   Infusions:  . dextrose 5 % and 0.45% NaCl 100 mL/hr at 06/02/14  0757  . Marland KitchenTPN (CLINIMIX-E) Adult 75 mL/hr at 06/02/14 1900   And  . fat emulsion     PRN: antiseptic oral rinse, hydrALAZINE, LORazepam, morphine injection, ondansetron, oxyCODONE, promethazine, sodium chloride  Insulin Requirements in the past 24 hours:  None   Current Nutrition:  Per [place] records:  Cycle TNA over 14 hours daily which provides 50 grams protein, 1192 kcal per day.  Additives per 2L bag: Sodium Phosphate 70mmol; Sodium Chloride 231meq; Potassium Chloride 50meq; Calcium Gluconate 24meq; Hyperlyte CR 59ml = 37meqK, 50meq Na, 68meq Ca, 67meq Mg; MVI 63ml; Addamel N 25ml; Famotidine 40mg ; no insulin.   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 She has a gastrostomy tube in place for decompression and is chronic TNA at home. Presents from home with worsening abdominal pain, nausea, vomiting and fever for the past 3 days. Patient with Acute on CKD secondary to bil ureteral obstruction from tumor, s/p lt. Percutaneous nephrostomy tube  6/12, now with good UOP. Patient is being admitted 6/11 for palliative care consultation and pain management. Pharmacy is consulted to continue TNA on admission.  Glucose: AM glucoses: 173, 148 Electrolytes: corrected calcium decreasing but remains elevated 10.5 with albumin 2; K low at 2.9 (repleted with IV and PO K); Mag slightly low also at 1.3.  LFTs: AlkPhos elevated and rising some, AST/ALT and TBili wnl Renal function: SCr high but trending down TGs: 109 (6/12), 255 (6/15) - rising Prealbumin: 20.2 (6/12)  Nutritional Goals:  RD: Continue per home TPN regimen to provide 1192 kcal and 50g protein.   TPN Access: implanted port   Plan: At 1800 today:  Continue Clinimix E 5/15 today to provide 50g protein, 1190 kcal as per home regimen. Infuse 1L over 14 hours. 69ml/hr ramp up and down, 6ml/hr x 12hrs. Due to  rising TGs and now above WNL - recommend lipids only be administered MWF over 14hrs each time TNA to contain standard multivitamins and trace elements daily  Reduce IVF to [facility] while TNA infusing, keep 111ml/hr per MD when TNA is off.  Due to low mag, will give 2g IV mag sulfate x 1 dose TNA lab panels on Mondays & Thursdays.  F/u daily. Note, if patient discharged recommend continuing as per home TNA per [facility] prior to admission. Would check triglycerides in the next couple of days after discharge and make sure does not continue to rise and consider only MWF lipid administration if necessary   Adrian Saran, PharmD, BCPS Pager [phone] 06/03/2014 12:08 PM

## 2014-06-03 NOTE — Progress Notes (Signed)
Cr trending down nicely.  Large out-put from PCN consistent with diuresis.  Signed,  Criselda Peaches, MD [facility]

## 2014-06-03 NOTE — Progress Notes (Signed)
Pt. Has been discharged home she has been given her discharge instructions and all questions were answered. She is to be transported home by her daughter.

## 2014-06-03 NOTE — Progress Notes (Signed)
CRITICAL VALUE ALERT  Critical value received:  K+ 2.9  Date of notification:  06/03/14  Time of notification:  0515  Critical value read back:yes  Nurse who received alert:  Harlow Asa  MD notified (1st page):  Polite  Time of first page:  0600  MD notified (2nd page):  Time of second page:  Responding MD:  Polite  Time MD responded:  [phone]

## 2014-06-04 ENCOUNTER — Other Ambulatory Visit (HOSPITAL_COMMUNITY): Payer: Self-pay | Admitting: Interventional Radiology

## 2014-06-04 DIAGNOSIS — N179 Acute kidney failure, unspecified: Secondary | ICD-10-CM

## 2014-06-04 NOTE — Discharge Summary (Signed)
Physician Discharge Summary  Patient ID: Lori Dennis MRN: [id number] DOB/AGE: Dec 15, 1946 68 y.o.  Admit date: 05/30/2014 Discharge date: 06/04/2014  Admission Diagnoses: Acute on chronic kidney failure Obstructive uropathy Metastatic carcinoid tumor of the abdomen Hypertension   Discharge Diagnoses:  Principal Problem:   Acute on chronic kidney failure Active Problems: Obstructive uropathy Metastatic carcinoid tumor the abdomen Hypertension    Discharged Condition: fair  Hospital Course: The patient was admitted on June 11 with vomiting and increasing abdominal pain. Her oncologist is noted recent rising creatinine. BUN/creatinine admission were 62 and 4.3. CT scan of the abdomen showed peritoneal carcinomatosis with tumor studding the liver surface and peritoneal, bilateral hydronephrosis with good position of double-J ureteral stents and increasing size of pelvic mass. Renal ultrasound confirmed bilateral hydronephrosis continuing greater on the right. Chest x-ray showed no acute disease, small pleural effusion with atelectasis. Abdominal series showed nonobstructive bowel gas pattern. Interventional radiology and nephrology was consulted. Nephrology felt the patient had acute on chronic kidney disease stage III based on bilateral ureteral obstruction. Percutaneous left kidney to was recommended in place without complication by interventional radiology. Urine output was good. The patient's creatinine improved down to 3.01 at discharge. Potassium was as low as 2.8 and was repleted IV. Magnesium is borderline low 1.3 and phosphorus  2.9. The patient's hemoglobin at admission was 6.6 and she received 2 units PRBC for symptomatic anemia and at discharge hemoglobin of 10.5. The patient's blood pressure was elevated and she was treated with amlodipine The patient was seen by the palliative care service and a hospice referral was made. She was discharged in improved condition   nephrology,  hematology/oncology and Interventional radiology   Significant Diagnostic Studies: labs: At discharge potassium 4.0, sodium 137, potassium 4.0, BUN 55, creatinine 2.01, alkaline phosphatase 277, bicarbonate 27 and radiology: CXR: No acute disease, KUB: Nonobstructive bowel gas pattern, CT scan: As above and Ultrasound: As above  Treatments: IV hydration, analgesia: Morphine, cardiac meds: Amlodipine, TPN and procedures: Left percutaneous nephrostomy tube placement   Discharge Exam: Blood pressure 156/80, pulse 80, temperature 97.2 F (36.2 C), temperature source Oral, resp. rate 16, height 5' 3.5" (1.613 m), weight 61.5 kg (135 lb 9.3 oz), SpO2 94.00%. General appearance: alert and cooperative Resp: clear to auscultation bilaterally Cardio: regular rate and rhythm, S1, S2 normal, no murmur, click, rub or gallop  Disposition: 01-Home or Self Care     Medication List         amLODipine 5 MG tablet  Commonly known as:  NORVASC  Take 1 tablet (5 mg total) by mouth daily.     antiseptic oral rinse Liqd  15 mLs by Mouth Rinse route as needed (for dry mouth).     fat emulsion 20 % infusion  Inject 100 mLs into the vein 2 (two) times a week. Wed and Sat     ondansetron 4 MG/2ML Soln injection  Commonly known as:  ZOFRAN  Inject 4 mg into the vein every 6 (six) hours as needed for nausea or vomiting.     oxyCODONE 20 MG/ML concentrated solution  Commonly known as:  ROXICODONE INTENSOL  Take 0.3-0.5 mLs (6-10 mg total) by mouth every 4 (four) hours as needed for pain.     promethazine 25 MG/ML injection  Commonly known as:  PHENERGAN  Inject 12.5 mg into the vein every 6 (six) hours as needed for nausea or vomiting.     TPN ADULT  Inject 2,000 mLs into the vein continuous. [place]  hours/day start between 2000-2100 Five days a week With MVI         Signed: GRIFFIN,JOHN JOSEPH 06/04/2014, 6:22 AM

## 2014-06-05 LAB — CULTURE, BLOOD (ROUTINE X 2)
CULTURE: NO GROWTH
Culture: NO GROWTH

## 2014-06-14 ENCOUNTER — Telehealth: Payer: Self-pay | Admitting: *Deleted

## 2014-06-14 NOTE — Telephone Encounter (Signed)
Received message from Nonda Lou, RN from [facility] requesting order to change port-a-cath needle and blood draw Sunday instead of Friday d/t RN vacation and pt doesn't want anyone else to access port.  Per Dr. Benay Spice; left message on Autumn Lawson # [phone], OK with MD for lab draw and needle change on Sunday.

## 2014-06-17 ENCOUNTER — Encounter: Payer: Self-pay | Admitting: *Deleted

## 2014-06-17 NOTE — Progress Notes (Signed)
Received labs from [facility]: BUN/Creat = 55/2.44 (improved since 6/15) and Hgb stable at 10.4. Copy to MD desk.

## 2014-06-25 ENCOUNTER — Telehealth: Payer: Self-pay | Admitting: *Deleted

## 2014-06-25 ENCOUNTER — Telehealth: Payer: Self-pay | Admitting: Oncology

## 2014-06-25 ENCOUNTER — Ambulatory Visit (HOSPITAL_BASED_OUTPATIENT_CLINIC_OR_DEPARTMENT_OTHER): Payer: Medicare Other | Admitting: Oncology

## 2014-06-25 VITALS — BP 152/81 | HR 81 | Temp 98.3°F | Resp 18 | Ht 63.5 in | Wt 115.1 lb

## 2014-06-25 DIAGNOSIS — N179 Acute kidney failure, unspecified: Secondary | ICD-10-CM

## 2014-06-25 DIAGNOSIS — C7B8 Other secondary neuroendocrine tumors: Secondary | ICD-10-CM

## 2014-06-25 DIAGNOSIS — R609 Edema, unspecified: Secondary | ICD-10-CM

## 2014-06-25 DIAGNOSIS — I10 Essential (primary) hypertension: Secondary | ICD-10-CM

## 2014-06-25 DIAGNOSIS — C7A00 Malignant carcinoid tumor of unspecified site: Secondary | ICD-10-CM

## 2014-06-25 DIAGNOSIS — D500 Iron deficiency anemia secondary to blood loss (chronic): Secondary | ICD-10-CM

## 2014-06-25 DIAGNOSIS — D3A00 Benign carcinoid tumor of unspecified site: Secondary | ICD-10-CM

## 2014-06-25 DIAGNOSIS — N1330 Unspecified hydronephrosis: Secondary | ICD-10-CM

## 2014-06-25 DIAGNOSIS — G893 Neoplasm related pain (acute) (chronic): Secondary | ICD-10-CM

## 2014-06-25 DIAGNOSIS — D638 Anemia in other chronic diseases classified elsewhere: Secondary | ICD-10-CM

## 2014-06-25 NOTE — Telephone Encounter (Signed)
gv pt appt schedule for aug °

## 2014-06-25 NOTE — Telephone Encounter (Signed)
Message from [place] with [facility] requesting certification form to be faxed for date of service 4/15-6/13. Form signed 04/16/14 printed from pt's chart. Faxed to [phone]. Requested they fax new form if this is not what was needed.

## 2014-06-25 NOTE — Progress Notes (Signed)
  [place] OFFICE PROGRESS NOTE   Diagnosis: Carcinoid tumor  INTERVAL HISTORY:   She returns as scheduled. She was admitted 05/30/2014 with progressive renal failure. She was seen by multiple consultants during the hospital admission and elected to proceed with a palliative percutaneous left nephrostomy tube. This was performed 05/31/2014. She reports feeling better since discharge from the hospital though she continues to have malaise. The creatinine has improved. She continues to have low abdominal pain. She has intermittent vomiting despite opening the gastrostomy tube. There is some output from the colostomy.  Objective:  Vital signs in last 24 hours:  Blood pressure 152/81, pulse 81, temperature 98.3 F (36.8 C), temperature source Oral, resp. rate 18, height 5' 3.5" (1.613 m), weight 115 lb 1.6 oz (52.209 kg).    HEENT: The mucous membranes are moist, mild whitecoat over the tongue Resp: Lungs clear bilaterally Cardio: Regular rate and rhythm with premature beats GI: G-tube site without evidence of infection. Minimal amount of liquid in the colostomy bag. Firmness and tenderness in the right lower abdomen Vascular: Trace edema at the left lower leg Skin: Left flank ostomy tube site with a gauze dressing  Portacath/PICC-without erythema  Lab Results:  Lab Results  Component Value Date   WBC 5.5 06/03/2014   HGB 10.5* 06/03/2014   HCT 31.7* 06/03/2014   MCV 87.3 06/03/2014   PLT 187 06/03/2014   NEUTROABS 3.6 06/03/2014   06/17/2014-BUN 55, creatinine 2.44, hemoglobin 10.4  Medications: I have reviewed the patient's current medications.  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 Acute renal failure secondary to  an obstructing pelvic mass and a left ureter stone, status post placement of bilateral ureter stents 04/08/2014  Recurrent acute renal failure 05/02/2014  Exchange of bilateral ureter stents 05/03/2014, bilateral 7 French stent  Persistent bilateral hydronephrosis on a renal ultrasound 05/29/2014 and an abdominal CT 05/30/2014 Left percutaneous nephrostomy tube 05/31/2014 3. history of Gross hematuria-likely secondary to the left ureter stone  4. Chronic left leg edema-negative Doppler 11/03/2012 and 05/03/2014  5. Nutrition-maintained on home TNA  6. Right scalp herpes zoster rash July 2013  7. Pain secondary to progressive carcinomatosis and urinary obstruction-she continues oxycodone as needed  8. anemia secondary to chronic disease, GU blood loss, and renal failure, status post a red cell transfusion 06/01/1999 9. Elevated calcium-potentially related to renal failure/TNA versus hypercalcemia malignancy. She received pamidronate 05/29/2014.  10. Hypertension -likely secondary to obstructive nephropathy, improved     Disposition:  Her overall performance status has improved since placement of the left percutaneous nephrostomy tube. She will see Dr. Jeffie Pollock next week to discuss placing a metal stent and the indication for keeping the ureter stents in place.  She continues home TNA. We check labs weekly. She will administer additional IV fluids when she has nausea and vomiting.  Ms. Cino will return for an office visit in approximately 4 weeks.  Betsy Coder, MD  06/25/2014  1:56 PM

## 2014-07-05 ENCOUNTER — Encounter: Payer: Self-pay | Admitting: Oncology

## 2014-07-09 ENCOUNTER — Other Ambulatory Visit (HOSPITAL_COMMUNITY): Payer: Medicare Other

## 2014-07-09 ENCOUNTER — Other Ambulatory Visit: Payer: Self-pay | Admitting: Urology

## 2014-07-11 ENCOUNTER — Ambulatory Visit (HOSPITAL_COMMUNITY)
Admission: RE | Admit: 2014-07-11 | Discharge: 2014-07-11 | Disposition: A | Discharge status: Home / Self Care | Payer: Medicare Other | Source: Ambulatory Visit | Attending: Interventional Radiology | Admitting: Interventional Radiology

## 2014-07-11 ENCOUNTER — Other Ambulatory Visit (HOSPITAL_COMMUNITY): Payer: Self-pay | Admitting: Interventional Radiology

## 2014-07-11 DIAGNOSIS — Z436 Encounter for attention to other artificial openings of urinary tract: Secondary | ICD-10-CM | POA: Insufficient documentation

## 2014-07-11 DIAGNOSIS — N135 Crossing vessel and stricture of ureter without hydronephrosis: Secondary | ICD-10-CM | POA: Diagnosis not present

## 2014-07-11 DIAGNOSIS — N179 Acute kidney failure, unspecified: Secondary | ICD-10-CM

## 2014-07-11 DIAGNOSIS — N19 Unspecified kidney failure: Secondary | ICD-10-CM | POA: Insufficient documentation

## 2014-07-11 MED ORDER — IOHEXOL 300 MG/ML  SOLN
5.0000 mL | Freq: Once | INTRAMUSCULAR | Status: AC | PRN
  Administered 2014-07-11: 5 mL

## 2014-07-11 MED ORDER — LIDOCAINE HCL 1 % IJ SOLN
INTRAMUSCULAR | Status: AC
  Filled 2014-07-11: qty 20

## 2014-07-11 NOTE — Procedures (Signed)
Successful LT PCN EXCHG NO COMP STABLE FULL REPORT IN PACS

## 2014-07-16 ENCOUNTER — Other Ambulatory Visit: Payer: Self-pay | Admitting: Oncology

## 2014-07-16 ENCOUNTER — Encounter: Payer: Self-pay | Admitting: Oncology

## 2014-07-16 DIAGNOSIS — D3A00 Benign carcinoid tumor of unspecified site: Secondary | ICD-10-CM

## 2014-07-24 ENCOUNTER — Encounter (HOSPITAL_COMMUNITY): Payer: Self-pay | Admitting: Pharmacy Technician

## 2014-07-24 ENCOUNTER — Telehealth: Payer: Self-pay | Admitting: Dietician

## 2014-07-24 ENCOUNTER — Encounter: Payer: Self-pay | Admitting: Oncology

## 2014-07-24 NOTE — Telephone Encounter (Signed)
Brief Outpatient Oncology Nutrition Note  Patient has been identified to be at risk on malnutrition screen.  Wt Readings from Last 10 Encounters:  06/25/14 115 lb 1.6 oz (52.209 kg)  05/30/14 135 lb 9.3 oz (61.5 kg)  05/28/14 131 lb 8 oz (59.648 kg)  05/02/14 132 lb 15 oz (60.3 kg)  05/02/14 132 lb 15 oz (60.3 kg)  04/23/14 125 lb 1.6 oz (56.745 kg)  04/10/14 129 lb 6.4 oz (58.695 kg)  04/10/14 129 lb 6.4 oz (58.695 kg)  04/10/14 129 lb 6.4 oz (58.695 kg)  03/14/14 127 lb 8 oz (57.834 kg)     Patient of Dr. Benay Spice. 1.Metastatic carcinoid tumor with carcinomatosis prompting multiple bowel resection procedures, status post a palliative sigmoid colectomy/colostomy, right colectomy, and gastrojejunostomy in April 2009, status post placement of a palliative gastrostomy tube 07/24/2013, ileostomy. 2. Acute renal failure secondary to an obstructing pelvic mass and a left ureter stone, status post placement of bilateral ureter stents 04/08/2014  Recurrent acute renal failure 05/02/2014  Exchange of bilateral ureter stents 05/03/2014, bilateral 7 French stent  Persistent bilateral hydronephrosis on a renal ultrasound 05/29/2014 and an abdominal CT 05/30/2014  Left percutaneous nephrostomy tube 05/31/2014   Called patient due to weight loss.  Weight 1 year ago of 117.  UBW of 124.    Patient states that the majority of weight loss currently was due to fluid loss.    Patient continues on home TPA 14 hours every night. Plus 1 L of IVF.   TPN per June hospitalization note provided 1192 kcal and 50 gm protein daily at home.    Estimated needs:  1500-1700 kcal, 60-70 gm protein.  Patient states that she is able to drink [employer], smoothies, yogurt, juice, applesauce, toast, crackers and a very small amount of peanut butter but most other foods clog the tube.  No vomiting in 2 weeks so feels that she is doing well.  Eats 1-3 times per day depending on how she is  feeling.  Discussed importance of weight maintenance, graining.  Eating 4-6 very small amounts daily.  Encouraged patient to call the [place] [facility] for any questions or concerns or if unable to maintain weight.  Antonieta Iba, RD, LDN

## 2014-07-25 ENCOUNTER — Encounter (HOSPITAL_COMMUNITY)
Admission: RE | Admit: 2014-07-25 | Discharge: 2014-07-25 | Disposition: A | Discharge status: Home / Self Care | Payer: Medicare Other | Source: Ambulatory Visit | Attending: Urology | Admitting: Urology

## 2014-07-25 ENCOUNTER — Encounter (HOSPITAL_COMMUNITY): Payer: Self-pay

## 2014-07-25 ENCOUNTER — Telehealth: Payer: Self-pay | Admitting: Oncology

## 2014-07-25 ENCOUNTER — Ambulatory Visit (HOSPITAL_BASED_OUTPATIENT_CLINIC_OR_DEPARTMENT_OTHER): Payer: Medicare Other | Admitting: Oncology

## 2014-07-25 VITALS — BP 162/80 | HR 85 | Temp 98.4°F | Resp 18 | Ht 63.5 in | Wt 116.4 lb

## 2014-07-25 DIAGNOSIS — R609 Edema, unspecified: Secondary | ICD-10-CM

## 2014-07-25 DIAGNOSIS — C7B8 Other secondary neuroendocrine tumors: Secondary | ICD-10-CM

## 2014-07-25 DIAGNOSIS — Z01818 Encounter for other preprocedural examination: Secondary | ICD-10-CM | POA: Diagnosis not present

## 2014-07-25 DIAGNOSIS — C7A00 Malignant carcinoid tumor of unspecified site: Secondary | ICD-10-CM

## 2014-07-25 DIAGNOSIS — D638 Anemia in other chronic diseases classified elsewhere: Secondary | ICD-10-CM

## 2014-07-25 DIAGNOSIS — G893 Neoplasm related pain (acute) (chronic): Secondary | ICD-10-CM

## 2014-07-25 DIAGNOSIS — B370 Candidal stomatitis: Secondary | ICD-10-CM

## 2014-07-25 DIAGNOSIS — D500 Iron deficiency anemia secondary to blood loss (chronic): Secondary | ICD-10-CM

## 2014-07-25 DIAGNOSIS — D3A00 Benign carcinoid tumor of unspecified site: Secondary | ICD-10-CM

## 2014-07-25 DIAGNOSIS — R109 Unspecified abdominal pain: Secondary | ICD-10-CM

## 2014-07-25 DIAGNOSIS — I10 Essential (primary) hypertension: Secondary | ICD-10-CM

## 2014-07-25 DIAGNOSIS — N179 Acute kidney failure, unspecified: Secondary | ICD-10-CM

## 2014-07-25 HISTORY — DX: Reserved for concepts with insufficient information to code with codable children: IMO0002

## 2014-07-25 HISTORY — DX: Gastrostomy status: Z93.1

## 2014-07-25 HISTORY — DX: Personal history of other medical treatment: Z92.89

## 2014-07-25 HISTORY — DX: Other specified health status: Z78.9

## 2014-07-25 HISTORY — DX: Presence of other vascular implants and grafts: Z95.828

## 2014-07-25 HISTORY — DX: Ileostomy status: Z93.2

## 2014-07-25 NOTE — Telephone Encounter (Signed)
Pt confirmed labs/ov per 08/06 POF, gave pt AVS....KJ °

## 2014-07-25 NOTE — Anesthesia Preprocedure Evaluation (Addendum)
Anesthesia Evaluation  Patient identified by MRN, date of birth, ID band Patient awake    Reviewed: Allergy & Precautions, H&P , NPO status , Patient's Chart, lab work & pertinent test results  History of Anesthesia Complications (+) history of anesthetic complications  Airway Mallampati: II TM Distance: >3 FB Neck ROM: Full    Dental no notable dental hx.    Pulmonary neg pulmonary ROS,  breath sounds clear to auscultation  Pulmonary exam normal       Cardiovascular hypertension, Pt. on medications Rhythm:Regular Rate:Normal     Neuro/Psych negative neurological ROS  negative psych ROS   GI/Hepatic negative GI ROS, Neg liver ROS,   Endo/Other  negative endocrine ROS  Renal/GU Renal InsufficiencyRenal diseaseCr 5.32 K 4.6     Musculoskeletal negative musculoskeletal ROS (+)   Abdominal   Peds  Hematology  (+) anemia ,   Anesthesia Other Findings   Reproductive/Obstetrics negative OB ROS                        Anesthesia Physical  Anesthesia Plan  ASA: III  Anesthesia Plan: General   Post-op Pain Management:    Induction: Intravenous  Airway Management Planned: LMA  Additional Equipment:   Intra-op Plan:   Post-operative Plan: Extubation in OR  Informed Consent: I have reviewed the patients History and Physical, chart, labs and discussed the procedure including the risks, benefits and alternatives for the proposed anesthesia with the patient or authorized representative who has indicated his/her understanding and acceptance.   Dental advisory given  Plan Discussed with: CRNA  Anesthesia Plan Comments: (Discussed plan for multiple access sites. She will start TPN earlier than usual and get as many hours of infusion before she comes. Her port is already accessed and will be saline flushed and heparin locked. She will also leave her gastrostomy tube unclamped to ensure her stomach  is empty because she has had high gastric volume in past. )       Anesthesia Quick Evaluation

## 2014-07-25 NOTE — Pre-Procedure Instructions (Addendum)
07-25-14 EKG 5'15, CXR 1 view 05-30-14 Epic. Dr. Lissa Hoard, anesthesiologist in to see -CXR ok to use. Pt. Having labs done on 07-26-14 and again 08-02-14 prior to surgery- will follow in epic view.

## 2014-07-25 NOTE — Progress Notes (Signed)
  [place] OFFICE PROGRESS NOTE   Diagnosis: Carcinoid tumor  INTERVAL HISTORY:   Lori Dennis returns as scheduled. She reports an improved energy level. She has some discomfort associated with the nephrostomy tube site. She continues to have abdominal pain. She is able to clamp the gastrostomy tube intermittently.  Lori Dennis saw Dr. Jeffie Pollock and has been scheduled for placement of metal ureter stents.  She continues nighttime TNA. She associates an altered taste with the oral candidiasis.  Objective:  Vital signs in last 24 hours:  Blood pressure 162/80, pulse 85, temperature 98.4 F (36.9 C), temperature source Oral, resp. rate 18, height 5' 3.5" (1.613 m), weight 116 lb 6.4 oz (52.799 kg), SpO2 100.00%.    HEENT: Whitecoat over the tongue Resp: Lungs clear bilaterally Cardio: Regular rate and rhythm GI: Firm fullness throughout the right abdomen with associated tenderness. Left upper quadrant gastrostomy tube site without evidence of infection. Left lower quadrant colostomy Vascular: Trace edema at the left lower leg  Skin: Dressing in place at the left flank nephrostomy site   Portacath/PICC-without erythema  Lab Results:  07/05/2014-creatinine 1.96, potassium 4.5, hemoglobin 9, calcium 8.8   Medications: I have reviewed the patient's current medications.  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 Acute renal failure secondary to an obstructing pelvic mass and a left ureter stone, status post placement of bilateral ureter stents 04/08/2014  Recurrent acute renal failure 05/02/2014  Exchange of bilateral ureter stents 05/03/2014, bilateral 7 French stent  Persistent bilateral hydronephrosis on a renal  ultrasound 05/29/2014 and an abdominal CT 05/30/2014  Left percutaneous nephrostomy tube 05/31/2014 3. history of Gross hematuria-likely secondary to the left ureter stone  4. Chronic left leg edema-negative Doppler 11/03/2012 and 05/03/2014  5. Nutrition-maintained on home TNA  6. Right scalp herpes zoster rash July 2013  7. Pain secondary to progressive carcinomatosis and urinary obstruction-she continues oxycodone as needed  8. anemia secondary to chronic disease, GU blood loss, and renal failure, status post a red cell transfusion 05/31/2014 9. Elevated calcium-potentially related to renal failure/TNA versus hypercalcemia malignancy. She received pamidronate 05/29/2014.  10. Hypertension -likely secondary to obstructive nephropathy, improved    Disposition:  Her overall status appears unchanged. We continue to monitor weekly chemistry panels via [facility].  She will followup with Dr. Jeffie Pollock to consider placement of metal ureter stents and management of the nephrostomy tube.  [name] will return for an office visit 09/05/2014. We will see her in the interim as needed.  Betsy Coder, MD  07/25/2014  10:01 AM

## 2014-07-25 NOTE — Patient Instructions (Addendum)
Lori Dennis  07/25/2014   Your procedure is scheduled on:   08-06-2014 Tuesday.  Enter through [facility] Entrance and follow signs to [facility]. Arrive at    0530    AM .  Call this number if you have problems the morning of surgery: [phone]  Or Presurgical Testing [phone](Kristena Wilhelmi) For Living Will and/or Health Care Power Attorney Forms: please provide copy for your medical record,may bring AM of surgery(Forms should be already notarized -we do not provide this service).(07-25-14 Yes, will provide copy with medical  On 08-06-14).     Do not eat food:After Midnight.      Take these medicines the morning of surgery with A SIP OF WATER: none , may have ice chips or sips water. Leave TPN connected if desires.   Do not wear jewelry, make-up or nail polish.  Do not wear lotions, powders, or perfumes. You may wear deodorant.  Do not shave 48 hours(2 days) prior to first CHG shower(legs and under arms).(Shaving face and neck okay.)  Do not bring valuables to the hospital.(Hospital is not responsible for lost valuables).  Contacts, dentures or removable bridgework, body piercing, hair pins may not be worn into surgery.  Leave suitcase in the car. After surgery it may be brought to your room.  For patients admitted to the hospital, checkout time is 11:00 AM the day of discharge.(Restricted visitors-Any Persons displaying flu-like symptoms or illness).    Patients discharged the day of surgery will not be allowed to drive home. Must have responsible person with you x 24 hours once discharged.  Name and phone number of your driver: Lori Dennis, daughter [phone] cell  Special Instructions: CHG(Chlorhedine 4%-"Hibiclens","Betasept","Aplicare") Shower Use Special Wash: see special instructions.(avoid face and genitals)       ___________________________    [facility] - Preparing for Surgery Before surgery, you can play an important role.  Because skin is  not sterile, your skin needs to be as free of germs as possible.  You can reduce the number of germs on your skin by washing with CHG (chlorahexidine gluconate) soap before surgery.  CHG is an antiseptic cleaner which kills germs and bonds with the skin to continue killing germs even after washing. Please DO NOT use if you have an allergy to CHG or antibacterial soaps.  If your skin becomes reddened/irritated stop using the CHG and inform your nurse when you arrive at Short Stay. Do not shave (including legs and underarms) for at least 48 hours prior to the first CHG shower.  You may shave your face/neck. Please follow these instructions carefully:  1.  Shower with CHG Soap the night before surgery and the  morning of Surgery.  2.  If you choose to wash your hair, wash your hair first as usual with your  normal  shampoo.  3.  After you shampoo, rinse your hair and body thoroughly to remove the  shampoo.                           4.  Use CHG as you would any other liquid soap.  You can apply chg directly  to the skin and wash                       Gently with a scrungie or clean washcloth.  5.  Apply the CHG Soap to your body ONLY FROM THE NECK DOWN.  Do not use on face/ open                           Wound or open sores. Avoid contact with eyes, ears mouth and genitals (private parts).                       Wash face,  Genitals (private parts) with your normal soap.             6.  Wash thoroughly, paying special attention to the area where your surgery  will be performed.  7.  Thoroughly rinse your body with warm water from the neck down.  8.  DO NOT shower/wash with your normal soap after using and rinsing off  the CHG Soap.                9.  Pat yourself dry with a clean towel.            10.  Wear clean pajamas.            11.  Place clean sheets on your bed the night of your first shower and do not  sleep with pets. Day of Surgery : Do not apply any lotions/deodorants the morning of surgery.   Please wear clean clothes to the hospital/surgery center.  FAILURE TO FOLLOW THESE INSTRUCTIONS MAY RESULT IN THE CANCELLATION OF YOUR SURGERY PATIENT SIGNATURE_________________________________  NURSE SIGNATURE__________________________________  ________________________________________________________________________

## 2014-07-29 ENCOUNTER — Telehealth: Payer: Self-pay | Admitting: *Deleted

## 2014-07-29 MED ORDER — FLUCONAZOLE 50 MG PO TABS: 50.0000 mg | ORAL_TABLET | Freq: Every day | ORAL | Status: DC

## 2014-07-29 NOTE — Telephone Encounter (Signed)
MD noted oral candidiasis on tongue at last visit and said he would order something for it. Pharmacy has nothing on file.

## 2014-07-31 NOTE — Pre-Procedure Instructions (Signed)
07-31-14 1110 Labs done by [facility]  On 07-26-14, results faxed to Dr. Ralene Muskrat office [phone] left for "Lori Dennis" to be aware. W. Floy Sabina

## 2014-08-05 NOTE — Pre-Procedure Instructions (Signed)
PT'S PREOP LABS - CBC, DIFF, CMET OBTAINED FROM [facility] - THE LABS WERE DRAWN Friday 08/02/14.  COPIES FAXED TO DR. Ralene Muskrat OFFICE.

## 2014-08-05 NOTE — H&P (Signed)
  Active Problems Problems  1. Gross hematuria (599.71) 2. Microscopic hematuria (599.72) 3. Renal calculus, left (592.0) 4. Renal cyst, left (753.10)  History of Present Illness Lori Dennis returns today in f/u. She has a history of advanced carcinoid with bilateral ureteral obstruction. She had stents placed in April and replaced with 7 x 24 stents in mid may. She had progressive renal insufficiency and had a left perc tube placed in mid June and her most recent Cr was down to 2.1. The Cr had been up to 6. She is tolerating the perc tube well. She has had no hematuria.   Past Medical History Problems  1. 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 History of renal failure (V13.09)  Surgical History Problems  1. History of Cholecystectomy 2. History of Cystoscopy With Insertion Of Ureteral Stent Bilateral 3. History of Cystoscopy With Insertion Of Ureteral Stent Bilateral 4. History of Cystoscopy With Ureteroscopy Left 5. History of Hysterectomy  Current Meds 1. AmLODIPine Besylate 5 MG Oral Tablet;  Therapy: (Recorded:16Jul2015) to Recorded 2. Ondansetron HCl 4 MG/2ML SOLN;  Therapy: (Recorded:16Jul2015) to Recorded 3. OxyCODONE HCl Powder;  Therapy: (Recorded:16Jul2015) to Recorded 4. Promethazine HCl 25 MG/ML Injection Solution;  Therapy: (Recorded:16Jul2015) to Recorded 5. TPN Electrolytes SOLN;  Therapy: (Recorded:16Jul2015) to Recorded  Allergies Medication  1. No Known Drug Allergies  Family History Problems  1. Family history of myocardial infarction (V17.3) : Mother  Social History Problems  1. Daily caffeine consumption, 2-3 servings a day 2. Divorced 3. Never a smoker 4. No alcohol use 5. Three children  Review of Systems  Genitourinary: (She is not voiding much with the stents and perc tube but she has urgency with the stents. ).  Gastrointestinal: abdominal pain.  Constitutional: no fever.    Vitals Vital Signs [Data Includes: Last 1 Day]  Recorded:  [id number] 10:16AM  Height: 5 ft 3.5 in Weight: 115 lb  BMI Calculated: 20.05 BSA Calculated: 1.54 Blood Pressure: 148 / 82 Temperature: 98.3 F Heart Rate: 82  Physical Exam Constitutional: Well nourished and well developed . No acute distress.  Pulmonary: No respiratory distress and normal respiratory rhythm and effort.  Cardiovascular: Heart rate and rhythm are normal . No peripheral edema.    Results/Data  The following images/tracing/specimen were independently visualized:  I have reviewed her recent CT films. She has had some progression of her carcinoid.    Assessment Assessed  1. Ureteral obstruction (593.4)  She is doing well with the stents and left perc.   Plan Renal calculus, left  1. KUB; Status:Hold For - Date of Service; Requested for:31Jul2015;  Renal cyst, left  2. Follow-up Office  Follow-up  Status: Hold For - Date of Service  Requested for: 31Jul2015 Ureteral obstruction  3. Follow-up Schedule Surgery Office  Follow-up  Status: Complete  Done: [id number]  I am going to get her set up for a stent change in mid august and will try to obtain metallic stents to see if they will work better and possibly allow clamping or removal of the NT.  I have reviewed the risks in detail including bleeding, infection, difficulty with replacement, increased stent irritation and anesthetic risks.   Discussion/Summary CC: Dr. Julieanne Manson.

## 2014-08-06 ENCOUNTER — Encounter (HOSPITAL_COMMUNITY): Payer: Medicare Other | Admitting: Anesthesiology

## 2014-08-06 ENCOUNTER — Other Ambulatory Visit: Payer: Self-pay | Admitting: Urology

## 2014-08-06 ENCOUNTER — Ambulatory Visit (HOSPITAL_COMMUNITY)
Admission: RE | Admit: 2014-08-06 | Discharge: 2014-08-06 | Disposition: A | Discharge status: Home / Self Care | Payer: Medicare Other | Source: Ambulatory Visit | Attending: Urology | Admitting: Urology

## 2014-08-06 ENCOUNTER — Ambulatory Visit (HOSPITAL_COMMUNITY): Payer: Medicare Other | Admitting: Anesthesiology

## 2014-08-06 ENCOUNTER — Encounter (HOSPITAL_COMMUNITY)
Admission: RE | Disposition: A | Discharge status: Home / Self Care | Payer: Self-pay | Source: Ambulatory Visit | Attending: Urology

## 2014-08-06 ENCOUNTER — Encounter (HOSPITAL_COMMUNITY): Payer: Self-pay | Admitting: *Deleted

## 2014-08-06 DIAGNOSIS — I10 Essential (primary) hypertension: Secondary | ICD-10-CM | POA: Diagnosis not present

## 2014-08-06 DIAGNOSIS — N135 Crossing vessel and stricture of ureter without hydronephrosis: Secondary | ICD-10-CM

## 2014-08-06 DIAGNOSIS — N289 Disorder of kidney and ureter, unspecified: Secondary | ICD-10-CM | POA: Diagnosis not present

## 2014-08-06 DIAGNOSIS — D3A00 Benign carcinoid tumor of unspecified site: Secondary | ICD-10-CM | POA: Insufficient documentation

## 2014-08-06 DIAGNOSIS — Z79899 Other long term (current) drug therapy: Secondary | ICD-10-CM | POA: Insufficient documentation

## 2014-08-06 DIAGNOSIS — D649 Anemia, unspecified: Secondary | ICD-10-CM | POA: Insufficient documentation

## 2014-08-06 HISTORY — PX: CYSTOSCOPY W/ URETERAL STENT PLACEMENT: SHX1429

## 2014-08-06 SURGERY — CYSTOSCOPY, WITH RETROGRADE PYELOGRAM AND URETERAL STENT INSERTION
Anesthesia: General | Laterality: Bilateral

## 2014-08-06 MED ORDER — SODIUM CHLORIDE 0.9 % IV SOLN: INTRAVENOUS | Status: DC

## 2014-08-06 MED ORDER — OXYCODONE HCL 5 MG/5ML PO SOLN
5.0000 mg | Freq: Once | ORAL | Status: AC | PRN
  Administered 2014-08-06: 5 mg via ORAL
  Filled 2014-08-06 (×2): qty 5

## 2014-08-06 MED ORDER — PROMETHAZINE HCL 25 MG/ML IJ SOLN: 6.2500 mg | INTRAMUSCULAR | Status: DC | PRN

## 2014-08-06 MED ORDER — OXYCODONE HCL 5 MG PO TABS: 5.0000 mg | ORAL_TABLET | Freq: Once | ORAL | Status: AC | PRN

## 2014-08-06 MED ORDER — MEPERIDINE HCL 50 MG/ML IJ SOLN: 6.2500 mg | INTRAMUSCULAR | Status: DC | PRN

## 2014-08-06 MED ORDER — CIPROFLOXACIN IN D5W 400 MG/200ML IV SOLN
400.0000 mg | INTRAVENOUS | Status: AC
  Administered 2014-08-06: 400 mg via INTRAVENOUS

## 2014-08-06 MED ORDER — STERILE WATER FOR IRRIGATION IR SOLN
Status: DC | PRN
Start: 2014-08-06 — End: 2014-08-06
  Administered 2014-08-06: 3000 mL

## 2014-08-06 MED ORDER — ONDANSETRON HCL 4 MG/2ML IJ SOLN
INTRAMUSCULAR | Status: DC | PRN
  Administered 2014-08-06: 4 mg via INTRAVENOUS

## 2014-08-06 MED ORDER — HYDROMORPHONE HCL PF 1 MG/ML IJ SOLN
INTRAMUSCULAR | Status: AC
  Filled 2014-08-06: qty 1

## 2014-08-06 MED ORDER — FENTANYL CITRATE 0.05 MG/ML IJ SOLN
INTRAMUSCULAR | Status: DC | PRN
  Administered 2014-08-06: 25 ug via INTRAVENOUS
  Administered 2014-08-06: 50 ug via INTRAVENOUS

## 2014-08-06 MED ORDER — PHENYLEPHRINE 40 MCG/ML (10ML) SYRINGE FOR IV PUSH (FOR BLOOD PRESSURE SUPPORT)
PREFILLED_SYRINGE | INTRAVENOUS | Status: AC
  Filled 2014-08-06: qty 10

## 2014-08-06 MED ORDER — MIDAZOLAM HCL 5 MG/5ML IJ SOLN
INTRAMUSCULAR | Status: DC | PRN
  Administered 2014-08-06: 2 mg via INTRAVENOUS

## 2014-08-06 MED ORDER — FENTANYL CITRATE 0.05 MG/ML IJ SOLN
INTRAMUSCULAR | Status: AC
  Filled 2014-08-06: qty 2

## 2014-08-06 MED ORDER — SODIUM CHLORIDE 0.9 % IV SOLN
INTRAVENOUS | Status: DC | PRN
  Administered 2014-08-06: 07:00:00 via INTRAVENOUS

## 2014-08-06 MED ORDER — CIPROFLOXACIN IN D5W 400 MG/200ML IV SOLN
INTRAVENOUS | Status: AC
  Filled 2014-08-06: qty 200

## 2014-08-06 MED ORDER — IOHEXOL 300 MG/ML  SOLN
INTRAMUSCULAR | Status: DC | PRN
  Administered 2014-08-06: 20 mL

## 2014-08-06 MED ORDER — MIDAZOLAM HCL 2 MG/2ML IJ SOLN
INTRAMUSCULAR | Status: AC
  Filled 2014-08-06: qty 2

## 2014-08-06 MED ORDER — PROPOFOL 10 MG/ML IV BOLUS
INTRAVENOUS | Status: DC | PRN
  Administered 2014-08-06: 110 mg via INTRAVENOUS

## 2014-08-06 MED ORDER — HYDROMORPHONE HCL PF 1 MG/ML IJ SOLN
0.2500 mg | INTRAMUSCULAR | Status: DC | PRN
Start: 2014-08-06 — End: 2014-08-06
  Administered 2014-08-06: 0.5 mg via INTRAVENOUS

## 2014-08-06 MED ORDER — PROPOFOL 10 MG/ML IV BOLUS
INTRAVENOUS | Status: AC
  Filled 2014-08-06: qty 20

## 2014-08-06 MED ORDER — PHENYLEPHRINE HCL 10 MG/ML IJ SOLN
INTRAMUSCULAR | Status: DC | PRN
  Administered 2014-08-06: 80 ug via INTRAVENOUS

## 2014-08-06 MED ORDER — 0.9 % SODIUM CHLORIDE (POUR BTL) OPTIME
TOPICAL | Status: DC | PRN
  Administered 2014-08-06: 1000 mL

## 2014-08-06 SURGICAL SUPPLY — 13 items
BAG URO CATCHER STRL LF (DRAPE) ×3 IMPLANT
CATH URET 5FR 28IN OPEN ENDED (CATHETERS) ×3 IMPLANT
CLOTH BEACON ORANGE TIMEOUT ST (SAFETY) ×3 IMPLANT
DRAPE CAMERA CLOSED 9X96 (DRAPES) ×3 IMPLANT
GLOVE SURG SS PI 8.0 STRL IVOR (GLOVE) IMPLANT
GOWN STRL REUS W/TWL XL LVL3 (GOWN DISPOSABLE) ×3 IMPLANT
GUIDEWIRE AMPLAZ .035X145 (WIRE) ×3 IMPLANT
GUIDEWIRE STR DUAL SENSOR (WIRE) ×3 IMPLANT
MANIFOLD NEPTUNE II (INSTRUMENTS) ×3 IMPLANT
PACK CYSTO (CUSTOM PROCEDURE TRAY) ×3 IMPLANT
STENT URETERAL METAL 6X24 (Stent) ×6 IMPLANT
TUBING CONNECTING 10 (TUBING) ×2 IMPLANT
TUBING CONNECTING 10' (TUBING) ×1

## 2014-08-06 NOTE — Discharge Instructions (Signed)
Ureteral Stent Implantation, Care After °Refer to this sheet in the next few weeks. These instructions provide you with information on caring for yourself after your procedure. Your health care provider may also give you more specific instructions. Your treatment has been planned according to current medical practices, but problems sometimes occur. Call your health care provider if you have any problems or questions after your procedure. °WHAT TO EXPECT AFTER THE PROCEDURE °You should be back to normal activity within 48 hours after the procedure. Nausea and vomiting may occur and are commonly the result of anesthesia. °It is common to experience sharp pain in the back or lower abdomen and penis with voiding. This is caused by movement of the ends of the stent with the act of urinating. It usually goes away within minutes after you have stopped urinating. °HOME CARE INSTRUCTIONS °Make sure to drink plenty of fluids. You may have small amounts of bleeding, causing your urine to be red. This is normal. Certain movements may trigger pain or a feeling that you need to urinate. You may be given medicines to prevent infection or bladder spasms. Be sure to take all medicines as directed. Only take over-the-counter or prescription medicines for pain, discomfort, or fever as directed by your health care provider. Do not take aspirin, as this can make bleeding worse. °Your stent will be left in until the blockage is resolved. This may take 2 weeks or longer, depending on the reason for stent implantation. You may have an X-ray exam to make sure your ureter is open and that the stent has not moved out of position (migrated). The stent can be removed by your health care provider in the office. Medicines may be given for comfort while the stent is being removed. Be sure to keep all follow-up appointments so your health care provider can check that you are healing properly. °SEEK MEDICAL CARE IF: °· You experience increasing  pain. °· Your pain medicine is not working. °SEEK IMMEDIATE MEDICAL CARE IF: °· Your urine is dark red or has blood clots. °· You are leaking urine (incontinent). °· You have a fever, chills, feeling sick to your stomach (nausea), or vomiting. °· Your pain is not relieved by pain medicine. °· The end of the stent comes out of the urethra. °· You are unable to urinate. °Document Released: 08/08/2013 Document Revised: 12/11/2013 Document Reviewed: 08/08/2013 °ExitCare® Patient Information ©2015 [employer]. This information is not intended to replace advice given to you by your health care provider. Make sure you discuss any questions you have with your health care provider. ° °

## 2014-08-06 NOTE — Progress Notes (Signed)
Preexisting ileostomy with out drainage. Site unremarkable. Preexisting gastrostomy tube to dependant drainage with bile green drainage. Site unremarkable.

## 2014-08-06 NOTE — Interval H&P Note (Signed)
History and Physical Interval Note:  08/06/2014 7:22 AM  Lori Dennis  has presented today for surgery, with the diagnosis of bilateral ureteral obstruction  The various methods of treatment have been discussed with the patient and family. After consideration of risks, benefits and other options for treatment, the patient has consented to  Procedure(s): CYSTOSCOPY WITH BILATERAL STENT EXCHANGE (Bilateral) as a surgical intervention .  The patient's history has been reviewed, patient examined, no change in status, stable for surgery.  I have reviewed the patient's chart and labs.  Questions were answered to the patient's satisfaction.     [name]

## 2014-08-06 NOTE — Transfer of Care (Signed)
Immediate Anesthesia Transfer of Care Note  Patient: Lori Dennis  Procedure(s) Performed: Procedure(s): CYSTOSCOPY WITH RETROGRADE PYELOGRAM BILATERAL STENT EXCHANGE (Bilateral)  Patient Location: PACU  Anesthesia Type:General  Level of Consciousness: awake, sedated and patient cooperative  Airway & Oxygen Therapy: Patient Spontanous Breathing and Patient connected to face mask oxygen  Post-op Assessment: Report given to PACU RN and Post -op Vital signs reviewed and stable  Post vital signs: Reviewed and stable  Complications: No apparent anesthesia complications

## 2014-08-06 NOTE — Progress Notes (Addendum)
Preexisting ileostomy without drainage. Preexisting left nephrostomy tube w tea-colored blood tinged urine. Preexisting gastrostomy tube w Bile color brown tinged drainage. PAC rt chest accessed prior to arrival. dsg C, D , I. Used for TPN at home Dsg in date. Taken care of by home health.  Patient has bruise left upper chest. Daughter states from " where leads pulled off her."

## 2014-08-06 NOTE — Op Note (Signed)
NAME:  OPLE, GIRGIS NO.:  [id number]  MEDICAL RECORD NO.:  [id number]  LOCATION:  [facility]                         FACILITY:  [facility]  PHYSICIAN:  Marshall Cork. Jeffie Pollock, M.D.    DATE OF BIRTH:  09-04-1946  DATE OF PROCEDURE:  08/06/2014 DATE OF DISCHARGE:                              OPERATIVE REPORT   PROCEDURE:  Cystoscopy with removal and replacement of bilateral double- J stents with bilateral retrograde pyelography.  PREOPERATIVE DIAGNOSIS:  Bilateral ureteral obstruction from carcinoid tumor.  POSTOPERATIVE DIAGNOSIS:  Bilateral ureteral obstruction from carcinoid tumor.  SURGEON:  Marshall Cork. Jeffie Pollock, M.D.  ANESTHESIA:  General.  SPECIMEN:  None.  DRAINS:  Bilateral 6 x 24 Cook Resonance metallic stents.  BLOOD LOSS:  Minimal.  COMPLICATIONS:  None.  INDICATIONS:  Ms. Boss is a 68 year old white female with advanced carcinoid tumor with bilateral ureteral obstruction.  She has had bilateral stents for sometime but the left stent in particular has not drained sufficiently requiring placement of a left nephrostomy tube. After discussing the options for treatment, we elected to attempt the [facility] Resonance metallic stents to see if that will provide better ureteral patency and allow [place] to get rid of the nephrostomy tube.  FINDINGS OF PROCEDURE:  She was given Cipro.  She was taken to the operating room where general anesthetic was induced.  She was placed in lithotomy position.  Her perineum and genitalia were prepped with Betadine solution.  She was fitted with PAS hose.  She was draped in usual sterile fashion.  Cystoscopy was performed using a 22-French scope and 12-degree lens. Examination revealed stents exiting both ureteral meatus cyst with minimal encrustation.  There was some skin irritation in the bladder but no other abnormalities.  The right stent was grasped first with grasping forceps and pulled the urethral meatus.  I chose the right side  because she had more tortuosity and I felt this would be the more difficult site.  A Sensor guidewire was advanced through the stent to the kidney and the stent was removed.  The cystoscope was reinserted over the wire and a 5- [place] open-end catheter was then advanced over the wire to its proximal extent.  The wire was then removed and contrast was instilled.  This revealed the tip of the 5-French catheter actually and a kink in the ureter about 4 cm below the ureteropelvic junction.  At this point, I reinserted the wire and advanced it into the kidney. The 5-French open-end catheter was advanced into the kidney as well.  In light of the tortuosity, I elected to use an Amplatz superstiff wire which was exchanged the open-end catheter to the kidney and the open-end catheter was removed.  The introducer catheter for the [facility] Resonance stent was then passed over the wire to the kidney and the inner core was then backed out.  Contrast was instilled.  Unfortunately, the tip of the introducer was backed below the UPJ and the kink.  I then reinserted the wire and was able to negotiate it back in the kidney and then advanced the introducer sheath over the wire to the kidney gradually backing up the inner core to allow further advancement  of the actual introducer tip.  Once the introducer tip was in the superior renal pelvis, the wire and inner core were then removed. Additional contrast was instilled.  The retrograde pyelogram as noted earlier revealed a kink in the proximal ureter but now revealed the tip in good position and markedly dilated renal pelvis.  At this point, the 6-French 24-cm Resonance stent was inserted through the introducer and pushed using the pusher into the renal pelvis.  It formed a large loop in the renal pelvis with a small coil in the bladder despite having proper length with a 24 cm stent earlier, this stent was generous in length but we did not have a shorter  stent available for placement, so later in the procedure, the stent was pulled out slightly in the bladder which improved the confirmation in the kidney.  Once the right stent was in position, we turned our attention to the left stent, it was grasped with grasping forceps, pulled the urethra and the wire was passed to the kidney.  The stent was removed without difficulty.  The wire was replaced through the scope and the 5-French open-end catheter was inserted over the wire.  The wire was removed and contrast was instilled.  This revealed good positioning of the tip of the catheter in the renal pelvis which is much less dilated than on the right side.  The retrograde pyelogram revealed a nephrostomy looped within the left collecting system with minimal dilation.  No significant filling defects.  Proximal ureter was unremarkable.  The guidewire was then reinserted through the open-end catheter.  The open-end catheter was removed.  The metallic stent introducer sheath was inserted over the wire to the kidney.  The inner core was backed out allowing passage of the introducer tip into the renal pelvis.  The inner core and wire were then removed and the metallic stent that once again a 6-French 24 cm was passed through the introducer sheath, then pushed to the kidney with a pusher.  Once good position, the sheath was removed leaving good coil in the kidney and a good coil in the bladder.  The length on this side was more appropriate to the anatomy with minimal redundancy in the kidney.  At this point, the bladder was drained.  The patient was taken down from lithotomy position.  Her anesthetic was reversed.  She will be moved to recovery room in stable condition.  There were no complications.     Marshall Cork. Jeffie Pollock, M.D.     JJW/MEDQ  D:  08/06/2014  T:  08/06/2014  Job:  [id number]  cc:   Ladell Pier, M.D. Fax: [phone]

## 2014-08-06 NOTE — Anesthesia Postprocedure Evaluation (Signed)
Anesthesia Post Note  Patient: Lori Dennis  Procedure(s) Performed: Procedure(s) (LRB): CYSTOSCOPY WITH RETROGRADE PYELOGRAM BILATERAL STENT EXCHANGE (Bilateral)  Anesthesia type: General  Patient location: PACU  Post pain: Pain level controlled  Post assessment: Post-op Vital signs reviewed  Last Vitals: BP 135/67  Pulse 79  Temp(Src) 36.4 C (Oral)  Resp 22  SpO2 100%  Post vital signs: Reviewed  Level of consciousness: sedated  Complications: No apparent anesthesia complications

## 2014-08-06 NOTE — Brief Op Note (Signed)
08/06/2014  8:18 AM  PATIENT:  Lori Dennis  68 y.o. female  PRE-OPERATIVE DIAGNOSIS:  bilateral ureteral obstruction  POST-OPERATIVE DIAGNOSIS:  bilateral ureteral obstruction  PROCEDURE:  Procedure(s): CYSTOSCOPY WITH RETROGRADE PYELOGRAM BILATERAL STENT EXCHANGE (Bilateral)  SURGEON:  Surgeon(s) and Role:    * Malka So, MD - Primary  PHYSICIAN ASSISTANT:   ASSISTANTS: none   ANESTHESIA:   general  EBL:     BLOOD ADMINISTERED:none  DRAINS: bilateral 81fr Cook Resonance Metallic stents   LOCAL MEDICATIONS USED:  NONE  SPECIMEN:  No Specimen  DISPOSITION OF SPECIMEN:  N/A  COUNTS:  YES  TOURNIQUET:  * No tourniquets in log *  DICTATION: .Other Dictation: Dictation Number [id number]  PLAN OF CARE: Discharge to home after PACU  PATIENT DISPOSITION:  PACU - hemodynamically stable.   Delay start of Pharmacological VTE agent (>24hrs) due to surgical blood loss or risk of bleeding: not applicable

## 2014-08-06 NOTE — Progress Notes (Signed)
Patient emptied  Gastrostomy and nephrostomy tube in toilet while in BR w tech and daughter

## 2014-08-07 ENCOUNTER — Encounter (HOSPITAL_COMMUNITY): Payer: Self-pay | Admitting: Urology

## 2014-08-08 ENCOUNTER — Encounter: Payer: Self-pay | Admitting: Oncology

## 2014-08-12 ENCOUNTER — Ambulatory Visit (HOSPITAL_COMMUNITY)
Admission: RE | Admit: 2014-08-12 | Discharge: 2014-08-12 | Disposition: A | Discharge status: Home / Self Care | Payer: Medicare Other | Source: Ambulatory Visit | Attending: Urology | Admitting: Urology

## 2014-08-12 DIAGNOSIS — Z436 Encounter for attention to other artificial openings of urinary tract: Secondary | ICD-10-CM | POA: Insufficient documentation

## 2014-08-12 DIAGNOSIS — N135 Crossing vessel and stricture of ureter without hydronephrosis: Secondary | ICD-10-CM | POA: Diagnosis present

## 2014-08-12 MED ORDER — IOHEXOL 300 MG/ML  SOLN
10.0000 mL | Freq: Once | INTRAMUSCULAR | Status: AC | PRN
  Administered 2014-08-12: 10 mL

## 2014-08-16 ENCOUNTER — Encounter: Payer: Self-pay | Admitting: Oncology

## 2014-08-20 ENCOUNTER — Other Ambulatory Visit: Payer: Self-pay | Admitting: Radiology

## 2014-08-21 ENCOUNTER — Other Ambulatory Visit: Payer: Self-pay | Admitting: Radiology

## 2014-08-22 ENCOUNTER — Other Ambulatory Visit (HOSPITAL_COMMUNITY): Payer: Self-pay | Admitting: Interventional Radiology

## 2014-08-22 ENCOUNTER — Ambulatory Visit (HOSPITAL_COMMUNITY)
Admission: RE | Admit: 2014-08-22 | Discharge: 2014-08-22 | Disposition: A | Discharge status: Home / Self Care | Payer: Medicare Other | Source: Ambulatory Visit | Attending: Interventional Radiology | Admitting: Interventional Radiology

## 2014-08-22 DIAGNOSIS — N135 Crossing vessel and stricture of ureter without hydronephrosis: Secondary | ICD-10-CM | POA: Insufficient documentation

## 2014-08-22 DIAGNOSIS — Z436 Encounter for attention to other artificial openings of urinary tract: Secondary | ICD-10-CM | POA: Insufficient documentation

## 2014-08-22 DIAGNOSIS — N1330 Unspecified hydronephrosis: Secondary | ICD-10-CM | POA: Insufficient documentation

## 2014-08-22 DIAGNOSIS — N179 Acute kidney failure, unspecified: Secondary | ICD-10-CM

## 2014-08-22 DIAGNOSIS — C801 Malignant (primary) neoplasm, unspecified: Secondary | ICD-10-CM | POA: Diagnosis not present

## 2014-08-22 DIAGNOSIS — C786 Secondary malignant neoplasm of retroperitoneum and peritoneum: Secondary | ICD-10-CM | POA: Diagnosis not present

## 2014-08-22 MED ORDER — IOHEXOL 300 MG/ML  SOLN
15.0000 mL | Freq: Once | INTRAMUSCULAR | Status: AC | PRN
  Administered 2014-08-22: 15 mL

## 2014-08-22 MED ORDER — IOHEXOL 300 MG/ML  SOLN: 10.0000 mL | Freq: Once | INTRAMUSCULAR | Status: AC | PRN

## 2014-08-22 NOTE — Procedures (Signed)
Hx: Left sided ureter obstruction, secondary to mesenteric mets.  Prior bilateral ureteral stents w/ Urology 8/18.  New Nephrostomy June 2015. Last PCN exchange 8/24. Procedure: Fluoro guided left PCN exchange.  Antegrade nephrostogram.  New left 62F pigtail.  Capped upon DC Findings:  Old PCN pigtail in collecting system.  Decompressed collecting system.   Antegrade flow of contrast through the left stent into bladder, seen to accumulate in the urinary bladder.  New 62F pigtail in collecting system. No complication. No blood loss.  Stable upon DC home. Plan: Remain capped for trial for potential removal of PCN.  Scheduled f/u with urology on 10/5, with potential exchange vs removal on 10/5 with IR.

## 2014-08-27 ENCOUNTER — Telehealth: Payer: Self-pay | Admitting: *Deleted

## 2014-08-27 ENCOUNTER — Encounter: Payer: Self-pay | Admitting: Oncology

## 2014-08-27 ENCOUNTER — Other Ambulatory Visit: Payer: Self-pay | Admitting: Oncology

## 2014-08-27 NOTE — Telephone Encounter (Signed)
08/23/14 Lab results from [facility] reviewed by Dr. Benay Spice (HGB 9.1, Creatinine 2.43 BUN 46) Copy faxed to Dr. Jeffie Pollock.

## 2014-09-03 ENCOUNTER — Telehealth: Payer: Self-pay | Admitting: *Deleted

## 2014-09-03 NOTE — Telephone Encounter (Signed)
Pt and daughter called "mother has a UTI and Dr. Jeffie Pollock from [facility] put her on Nitrofurantoin mono mcr 100 mg and she just can not tolerate this medicine."  Per Patient; she reports she is very queasy; it's a horse pill and I really can't tolerate this"  When daughter asked for something liquid; was told this med does not come in liquid form.  Pt/daughter asking Dr. Benay Spice advise.  Pt states "I haven't been able to take it as directed because it's making me sick on my stomach"  Note to Dr. Benay Spice

## 2014-09-04 NOTE — Telephone Encounter (Signed)
Per Dr. Benay Spice; spoke with pt informing her Dr. Benay Spice recommends to follow up with Dr. Jeffie Pollock; confirmed that [facility] doesn't come in solution form.  Pt states that the Hospice RN has been trying to call Dr. Jeffie Pollock without any contact yet.  Informed pt that Dr. Benay Spice will also try to contact Dr. Jeffie Pollock for update.  Pt states she has stopped taking Macrobid for now "because it was to rough" Pt states that stents were replaced with the metal stents this time to "hold the tumors back and they don't have to be changed as often as the others; with the other stents my creatinine kept going up"  Pt expressed appreciation for call back and confirmed appt 09/05/14.

## 2014-09-05 ENCOUNTER — Telehealth: Payer: Self-pay | Admitting: Oncology

## 2014-09-05 ENCOUNTER — Ambulatory Visit (HOSPITAL_BASED_OUTPATIENT_CLINIC_OR_DEPARTMENT_OTHER): Payer: Medicare Other | Admitting: Oncology

## 2014-09-05 ENCOUNTER — Other Ambulatory Visit: Payer: Self-pay | Admitting: *Deleted

## 2014-09-05 VITALS — BP 148/73 | HR 83 | Temp 98.3°F | Resp 19 | Ht 63.5 in | Wt 109.6 lb

## 2014-09-05 DIAGNOSIS — Z23 Encounter for immunization: Secondary | ICD-10-CM

## 2014-09-05 DIAGNOSIS — R310 Gross hematuria: Secondary | ICD-10-CM

## 2014-09-05 DIAGNOSIS — R609 Edema, unspecified: Secondary | ICD-10-CM

## 2014-09-05 DIAGNOSIS — C7B8 Other secondary neuroendocrine tumors: Secondary | ICD-10-CM

## 2014-09-05 DIAGNOSIS — I10 Essential (primary) hypertension: Secondary | ICD-10-CM

## 2014-09-05 DIAGNOSIS — N1330 Unspecified hydronephrosis: Secondary | ICD-10-CM

## 2014-09-05 DIAGNOSIS — C7A00 Malignant carcinoid tumor of unspecified site: Secondary | ICD-10-CM

## 2014-09-05 DIAGNOSIS — R109 Unspecified abdominal pain: Secondary | ICD-10-CM

## 2014-09-05 DIAGNOSIS — N179 Acute kidney failure, unspecified: Secondary | ICD-10-CM

## 2014-09-05 DIAGNOSIS — D638 Anemia in other chronic diseases classified elsewhere: Secondary | ICD-10-CM

## 2014-09-05 DIAGNOSIS — G893 Neoplasm related pain (acute) (chronic): Secondary | ICD-10-CM

## 2014-09-05 DIAGNOSIS — D500 Iron deficiency anemia secondary to blood loss (chronic): Secondary | ICD-10-CM

## 2014-09-05 MED ORDER — TRAMADOL 5 MG/ML ORAL SUSPENSION: 50.0000 mg | Freq: Four times a day (QID) | ORAL | Status: DC | PRN

## 2014-09-05 MED ORDER — INFLUENZA VAC SPLIT QUAD 0.5 ML IM SUSY
0.5000 mL | PREFILLED_SYRINGE | Freq: Once | INTRAMUSCULAR | Status: AC
  Administered 2014-09-05: 0.5 mL via INTRAMUSCULAR
  Filled 2014-09-05: qty 0.5

## 2014-09-05 MED ORDER — FLUCONAZOLE 50 MG PO TABS: 50.0000 mg | ORAL_TABLET | Freq: Every day | ORAL | Status: DC

## 2014-09-05 MED ORDER — OXYCODONE HCL 20 MG/ML PO CONC: 10.0000 mg | ORAL | Status: DC | PRN

## 2014-09-05 NOTE — Telephone Encounter (Signed)
Pt confirmed md visit per 09/17 POF, gave pt AVS...KJ °

## 2014-09-05 NOTE — Progress Notes (Signed)
  [place] OFFICE PROGRESS NOTE   Diagnosis: Carcinoid tumor  INTERVAL HISTORY:   She returns as scheduled. She underwent placement of metal ureter stents on 08/06/2014. She noticed increased pain for a few weeks following the procedure. This has improved. She now has increased lower abdominal pain, chiefly on the right side. She relates this to cancer pain. Oxycodone helps the pain, but causes somnolence.  She began treatment with Macrobid last week for urinary tract infection. This caused nausea. She discontinued the Macrobid 2 days ago and the nausea has improved. The left nephrostomy tube is now capped. She is scheduled to undergo removal of the nephrostomy tube in approximately 3 weeks.  Objective:  Vital signs in last 24 hours:  Blood pressure 148/73, pulse 83, temperature 98.3 F (36.8 C), temperature source Oral, resp. rate 19, height 5' 3.5" (1.613 m), weight 109 lb 9.6 oz (49.714 kg).    HEENT: Whitecoat over the tongue, no buccal thrush Resp: Lungs clear bilaterally Cardio: Regular rate and rhythm GI: Tender in the right lower abdomen with firmness. Left upper quadrant gastrostomy tube. Left lower quadrant colostomy Vascular: Trace edema at the left lower leg and ankle   Portacath/PICC-without erythema  Lab Results:  Medications: I have reviewed the patient's current medications.  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 Acute renal failure secondary to an obstructing pelvic mass and a left ureter stone, status post placement of bilateral ureter stents 04/08/2014  Recurrent acute renal failure 05/02/2014  Exchange of bilateral ureter stents 05/03/2014, bilateral 7 French stent  Persistent bilateral  hydronephrosis on a renal ultrasound 05/29/2014 and an abdominal CT 05/30/2014  Left percutaneous nephrostomy tube 05/31/2014 Placement of bilateral metal ureter stents 08/06/2049 3. history of Gross hematuria-likely secondary to the left ureter stone  4. Chronic left leg edema-negative Doppler 11/03/2012 and 05/03/2014  5. Nutrition-maintained on home TNA  6. Right scalp herpes zoster rash July 2013  7. Pain secondary to progressive carcinomatosis and urinary obstruction-she continues oxycodone as needed  8. anemia secondary to chronic disease, GU blood loss, and renal failure, status post a red cell transfusion 05/31/2014  9. Elevated calcium-potentially related to renal failure/TNA versus hypercalcemia malignancy. She received pamidronate 05/29/2014.  10. Hypertension -likely secondary to obstructive nephropathy, improved  11. Urinalysis 08/19/2014 with too numerous to count white cells, culture positive for Streptococcus  viridans and a coagulase negative Staphylococcus, she was unable to tolerate Macrobid   Disposition:  Her performance status appears to be slowly declining. She has increased abdominal pain secondary to carcinomatosis. We added tramadol to use in addition to the oxycodone. She will contact us if she is taking pain medicine frequently and we will add a Duragesic patch.  She will followup with Dr. Jeffie Pollock to decide on removing the nephrostomy tube.  She continues home TNA.  Ms. Lipps received an influenza vaccine today.  Is unclear whether the urine culture on 08/19/2014 represented a true infection versus an effect of the ureter stents. She will hold on antibiotic therapy for now  Ms. Melnik will return for an office visit in approximately 5 weeks.  Betsy Coder, MD  09/05/2014  12:00 PM

## 2014-09-06 ENCOUNTER — Ambulatory Visit (HOSPITAL_BASED_OUTPATIENT_CLINIC_OR_DEPARTMENT_OTHER): Payer: Medicare Other

## 2014-09-06 ENCOUNTER — Other Ambulatory Visit: Payer: Self-pay | Admitting: *Deleted

## 2014-09-06 ENCOUNTER — Other Ambulatory Visit: Payer: Self-pay | Admitting: Oncology

## 2014-09-06 DIAGNOSIS — R300 Dysuria: Secondary | ICD-10-CM

## 2014-09-06 DIAGNOSIS — R310 Gross hematuria: Secondary | ICD-10-CM

## 2014-09-06 LAB — URINALYSIS, MICROSCOPIC - CHCC
Bilirubin (Urine): NEGATIVE
Glucose: NEGATIVE mg/dL
Ketones: NEGATIVE mg/dL
NITRITE: NEGATIVE
PROTEIN: 100 mg/dL
Specific Gravity, Urine: 1.01 (ref 1.003–1.035)
UROBILINOGEN UR: 0.2 mg/dL (ref 0.2–1)
pH: 8 (ref 4.6–8.0)

## 2014-09-06 MED ORDER — TRAMADOL HCL 50 MG PO TABS: 50.0000 mg | ORAL_TABLET | Freq: Four times a day (QID) | ORAL | Status: DC | PRN

## 2014-09-06 NOTE — Telephone Encounter (Signed)
Message from pt's daughter requesting Rx for Tramadol tablets to be called to pharmacy. They were unable to get suspension due to cost and short stability of medication. Rx called in. Per Dr. Benay Spice: Discussed case with Dr. Jeffie Pollock, recommend a UA/ C+S. Orders entered. Pt reports her nurse has already been out today, she will come to office for urinalysis. Informed her that Tramadol has been called in.

## 2014-09-08 LAB — URINE CULTURE

## 2014-09-09 ENCOUNTER — Telehealth: Payer: Self-pay | Admitting: *Deleted

## 2014-09-09 ENCOUNTER — Encounter: Payer: Self-pay | Admitting: Oncology

## 2014-09-09 NOTE — Telephone Encounter (Signed)
Pt's daughter [name] called asking about UA/Culture 09/06/14 and Creatinine/BUN increase.  Per Dr. Benay Spice; notified [name] that MD reviewed labs and UA/Culture negative; no treatment at this time and lab were faxed to Dr. Jeffie Pollock office Friday; f/u with Dr. Jeffie Pollock office.  [name] verbalized understanding and expressed appreciation for call back.  Also spoke with Jeannetta Nap, pharmacist from [facility] and notified her per MD no treatment at this time.  Miranda verbalized understanding

## 2014-09-16 ENCOUNTER — Telehealth: Payer: Self-pay | Admitting: *Deleted

## 2014-09-16 NOTE — Telephone Encounter (Signed)
Received faxed copy of labs from 9/25: HGB 9.9 Creatinine 2.24 BUN 45. Reviewed by Dr. Benay Spice: copy faxed to Dr. Jeffie Pollock.

## 2014-09-20 ENCOUNTER — Telehealth: Payer: Self-pay | Admitting: *Deleted

## 2014-09-20 MED ORDER — LEVOFLOXACIN 250 MG PO TABS
250.0000 mg | ORAL_TABLET | Freq: Every day | ORAL | Status: DC
Start: 2014-09-20 — End: 2014-10-09

## 2014-09-20 NOTE — Telephone Encounter (Signed)
Per Dr. Benay Spice : Try Levaquin 250 mg daily X 10 days and call if this does not resolve problem. Also noted BUN/Creat is higher-faxed copy of labs to Dr. Jeffie Pollock and she needs to push her extra IV fluids again. Does she still have her nephrostomy tubes? Left above on nurse's voice mail and requested return call to confirm.

## 2014-09-20 NOTE — Telephone Encounter (Signed)
VM from nurse that she has scab on PAC site that is not healing and some pink discoloration below this area.  She has no fever or drainage. Asking if MD needs to see her or try antibiotic as was done in past?

## 2014-09-20 NOTE — Telephone Encounter (Signed)
Left VM for Lori Dennis that antibiotic has been sent to pharmacy and to call if this does not help PAC site or it gets worse. Instructed her to push more IV fluids due to increase in renal functions.Marland Kitchen

## 2014-09-23 ENCOUNTER — Telehealth: Payer: Self-pay | Admitting: *Deleted

## 2014-09-23 ENCOUNTER — Ambulatory Visit (HOSPITAL_COMMUNITY)
Admission: RE | Admit: 2014-09-23 | Discharge: 2014-09-23 | Disposition: A | Discharge status: Home / Self Care | Payer: Medicare Other | Source: Ambulatory Visit | Attending: Interventional Radiology | Admitting: Interventional Radiology

## 2014-09-23 DIAGNOSIS — Z452 Encounter for adjustment and management of vascular access device: Secondary | ICD-10-CM | POA: Insufficient documentation

## 2014-09-23 DIAGNOSIS — N131 Hydronephrosis with ureteral stricture, not elsewhere classified: Secondary | ICD-10-CM | POA: Diagnosis not present

## 2014-09-23 DIAGNOSIS — Z931 Gastrostomy status: Secondary | ICD-10-CM | POA: Insufficient documentation

## 2014-09-23 DIAGNOSIS — Z436 Encounter for attention to other artificial openings of urinary tract: Secondary | ICD-10-CM | POA: Insufficient documentation

## 2014-09-23 DIAGNOSIS — N179 Acute kidney failure, unspecified: Secondary | ICD-10-CM

## 2014-09-23 DIAGNOSIS — C801 Malignant (primary) neoplasm, unspecified: Secondary | ICD-10-CM | POA: Insufficient documentation

## 2014-09-23 DIAGNOSIS — C786 Secondary malignant neoplasm of retroperitoneum and peritoneum: Secondary | ICD-10-CM | POA: Diagnosis not present

## 2014-09-23 MED ORDER — IOHEXOL 300 MG/ML  SOLN
10.0000 mL | Freq: Once | INTRAMUSCULAR | Status: AC | PRN
  Administered 2014-09-23: 10 mL

## 2014-09-23 NOTE — Telephone Encounter (Signed)
Received fax from [facility] with lab results from 10/2. HGB 9.9 creat 2.65. Results to Dr. Gearldine Shown desk for review.

## 2014-09-24 ENCOUNTER — Encounter: Payer: Self-pay | Admitting: Oncology

## 2014-09-27 ENCOUNTER — Telehealth: Payer: Self-pay | Admitting: *Deleted

## 2014-09-27 NOTE — Telephone Encounter (Signed)
Message from pt's daughter reporting her L leg is "super swollen, as swollen as it's ever been." Called pt, she asked to clarify whether she should continue Levaquin Dr. Benay Spice prescribed or stop this to begin Doxycycline per Dr. Jeffie Pollock. Reviewed with Dr. Benay Spice: Order received to STOP Levaquin. Continue elevated leg for edema. Pt voiced understanding. Instructed her to call office if leg becomes red, hot or painful. She voiced understanding.

## 2014-10-01 ENCOUNTER — Encounter: Payer: Self-pay | Admitting: Oncology

## 2014-10-04 ENCOUNTER — Telehealth: Payer: Self-pay | Admitting: *Deleted

## 2014-10-04 DIAGNOSIS — D3A098 Benign carcinoid tumors of other sites: Secondary | ICD-10-CM

## 2014-10-04 NOTE — Telephone Encounter (Signed)
@  22 daughter left VM asking if this office will change her PAC needle on Wednesday and draw her weekly labs?

## 2014-10-04 NOTE — Telephone Encounter (Signed)
Left VM for nurse that we will assess and access her PAC on Wednesday and draw her weekly labs. Will fax results to [place]. Requested she make patient aware of this. POF sent to scheduler.

## 2014-10-04 NOTE — Telephone Encounter (Signed)
Message from [place] that her port site is getting pink again. Was getting better with the antibiotic, but is getting pink again. Has follow up with Dr. Benay Spice on 10/21 and requests MD assess the port them. Dr. Benay Spice agrees he will evaluate the port Wednesday. Hold off on antibiotic at this time.

## 2014-10-07 ENCOUNTER — Telehealth: Payer: Self-pay | Admitting: Oncology

## 2014-10-07 NOTE — Telephone Encounter (Signed)
Confirm appt d/t for 10/09/14 added lab/flush with dr visit.

## 2014-10-08 ENCOUNTER — Encounter: Payer: Self-pay | Admitting: Oncology

## 2014-10-09 ENCOUNTER — Ambulatory Visit: Payer: Medicare Other

## 2014-10-09 ENCOUNTER — Other Ambulatory Visit: Payer: Self-pay | Admitting: *Deleted

## 2014-10-09 ENCOUNTER — Telehealth: Payer: Self-pay | Admitting: Oncology

## 2014-10-09 ENCOUNTER — Ambulatory Visit (HOSPITAL_BASED_OUTPATIENT_CLINIC_OR_DEPARTMENT_OTHER): Payer: Medicare Other | Admitting: Oncology

## 2014-10-09 ENCOUNTER — Other Ambulatory Visit (HOSPITAL_BASED_OUTPATIENT_CLINIC_OR_DEPARTMENT_OTHER): Payer: Medicare Other

## 2014-10-09 VITALS — BP 132/86 | HR 89 | Temp 97.9°F | Resp 18 | Ht 63.5 in | Wt 113.2 lb

## 2014-10-09 DIAGNOSIS — D3A098 Benign carcinoid tumors of other sites: Secondary | ICD-10-CM

## 2014-10-09 DIAGNOSIS — C7B09 Secondary carcinoid tumors of other sites: Secondary | ICD-10-CM

## 2014-10-09 DIAGNOSIS — D638 Anemia in other chronic diseases classified elsewhere: Secondary | ICD-10-CM

## 2014-10-09 DIAGNOSIS — C7A00 Malignant carcinoid tumor of unspecified site: Secondary | ICD-10-CM

## 2014-10-09 LAB — COMPREHENSIVE METABOLIC PANEL (CC13)
ALBUMIN: 2.5 g/dL — AB (ref 3.5–5.0)
ALT: 29 U/L (ref 0–55)
AST: 37 U/L — AB (ref 5–34)
Alkaline Phosphatase: 295 U/L — ABNORMAL HIGH (ref 40–150)
Anion Gap: 10 mEq/L (ref 3–11)
BUN: 59.4 mg/dL — ABNORMAL HIGH (ref 7.0–26.0)
CALCIUM: 11 mg/dL — AB (ref 8.4–10.4)
CHLORIDE: 103 meq/L (ref 98–109)
CO2: 28 mEq/L (ref 22–29)
Creatinine: 2.6 mg/dL — ABNORMAL HIGH (ref 0.6–1.1)
Glucose: 124 mg/dl (ref 70–140)
POTASSIUM: 4.9 meq/L (ref 3.5–5.1)
SODIUM: 141 meq/L (ref 136–145)
TOTAL PROTEIN: 7.6 g/dL (ref 6.4–8.3)
Total Bilirubin: 0.37 mg/dL (ref 0.20–1.20)

## 2014-10-09 LAB — CBC WITH DIFFERENTIAL/PLATELET
BASO%: 0.7 % (ref 0.0–2.0)
Basophils Absolute: 0.1 10*3/uL (ref 0.0–0.1)
EOS%: 0.2 % (ref 0.0–7.0)
Eosinophils Absolute: 0 10*3/uL (ref 0.0–0.5)
HCT: 34.8 % (ref 34.8–46.6)
HEMOGLOBIN: 11 g/dL — AB (ref 11.6–15.9)
LYMPH#: 1.5 10*3/uL (ref 0.9–3.3)
LYMPH%: 14.4 % (ref 14.0–49.7)
MCH: 30.3 pg (ref 25.1–34.0)
MCHC: 31.5 g/dL (ref 31.5–36.0)
MCV: 96.1 fL (ref 79.5–101.0)
MONO#: 0.4 10*3/uL (ref 0.1–0.9)
MONO%: 3.7 % (ref 0.0–14.0)
NEUT#: 8.3 10*3/uL — ABNORMAL HIGH (ref 1.5–6.5)
NEUT%: 81 % — ABNORMAL HIGH (ref 38.4–76.8)
Platelets: 357 10*3/uL (ref 145–400)
RBC: 3.62 10*6/uL — ABNORMAL LOW (ref 3.70–5.45)
RDW: 14.4 % (ref 11.2–14.5)
WBC: 10.3 10*3/uL (ref 3.9–10.3)

## 2014-10-09 LAB — PHOSPHORUS: Phosphorus: 3.1 mg/dL (ref 2.3–4.6)

## 2014-10-09 LAB — MAGNESIUM (CC13): Magnesium: 2.5 mg/dl (ref 1.5–2.5)

## 2014-10-09 LAB — PREALBUMIN: Prealbumin: 29.9 mg/dL (ref 17.0–34.0)

## 2014-10-09 MED ORDER — FLUCONAZOLE 50 MG PO TABS: 50.0000 mg | ORAL_TABLET | Freq: Every day | ORAL | Status: DC

## 2014-10-09 MED ORDER — SODIUM CHLORIDE 0.9 % IJ SOLN
10.0000 mL | INTRAMUSCULAR | Status: DC | PRN
  Filled 2014-10-09: qty 10

## 2014-10-09 MED ORDER — HEPARIN SOD (PORK) LOCK FLUSH 100 UNIT/ML IV SOLN
500.0000 [IU] | Freq: Once | INTRAVENOUS | Status: DC
  Filled 2014-10-09: qty 5

## 2014-10-09 NOTE — Progress Notes (Signed)
  [place] OFFICE PROGRESS NOTE   Diagnosis: Carcinoid tumor  INTERVAL HISTORY:   She returns as scheduled. She reports increased nausea and abdominal pain over the past few weeks. She has small volume emesis despite having me gastrostomy tube open. There is liquid output in the colostomy. The left nephrostomy has been removed. Dr. Jeffie Pollock placed metal ureter stents. She was treated for a urinary tract infection with doxycycline. She takes oxycodone for pain. There is increased erythema at the Port-A-Cath site.  Objective:  Vital signs in last 24 hours:  Blood pressure 132/86, pulse 89, temperature 97.9 F (36.6 C), temperature source Oral, resp. rate 18, height 5' 3.5" (1.613 m), weight 113 lb 3.2 oz (51.347 kg).    HEENT: Whitecoat over the tongue, no buccal thrush Resp: Lungs clear bilaterally Cardio: Regular rate and rhythm GI: Tender in the right and left lower abdomen with firmness throughout the right lower abdomen. Mild erythema at the left upper quadrant G-tube site. Left lower quadrant colostomy with liquid Vascular: Pitting edema at the left lower leg and ankle     Portacath/PICC-the Port-A-Cath is superficial with mild erythema and induration overlying the port. There are several scattered areas at the central aspect of the port. No tenderness.  Lab Results: 10/04/2012-hemoglobin 9.2, platelets 255,000, ANC 4.4 BUN 52, creatinine 2.03, calcium 9.5, albumin 2.5   Imaging:  No results found.  Medications: I have reviewed the patient's current medications.  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 Acute renal failure secondary to an obstructing pelvic mass and a left ureter stone, status post placement of  bilateral ureter stents 04/08/2014  Recurrent acute renal failure 05/02/2014  Exchange of bilateral ureter stents 05/03/2014, bilateral 7 French stent  Persistent bilateral hydronephrosis on a renal ultrasound 05/29/2014 and an abdominal CT 05/30/2014  Left percutaneous nephrostomy tube 05/31/2014  Placement of bilateral metal ureter stents 08/06/2049 3. history of Gross hematuria-likely secondary to the left ureter stone  4. Chronic left leg edema-negative Doppler 11/03/2012 and 05/03/2014  5. Nutrition-maintained on home TNA  6. Right scalp herpes zoster rash July 2013  7. Pain secondary to progressive carcinomatosis and urinary obstruction-she continues oxycodone as needed  8. anemia secondary to chronic disease, GU blood loss, and renal failure, status post a red cell transfusion 05/31/2014  9. Elevated calcium-potentially related to renal failure/TNA versus hypercalcemia malignancy. She received pamidronate 05/29/2014.  10. Hypertension -likely secondary to obstructive nephropathy, improved  11. Urinalysis 08/19/2014 with too numerous to count white cells, culture positive for Streptococcus viridans and a coagulase negative Staphylococcus, she was unable to tolerate Macrobid   Disposition:  The progressive nausea and abdominal pain likely secondary to carcinomatosis. She will try Ativan for the nausea. The port appears close to the skin surface. I suspect the induration and lack of healing at the puncture sites are related to chronic skin pressure. We will make a referral to Dr. Zella Richer to evaluate the Port-A-Cath site.  Ms. Soman will return for an office visit in one month.  Betsy Coder, MD  10/09/2014  11:59 AM

## 2014-10-09 NOTE — Progress Notes (Signed)
Patient stated that desk RN will draw labs. Informed desk nurse.

## 2014-10-09 NOTE — Patient Instructions (Signed)

## 2014-10-09 NOTE — Telephone Encounter (Signed)
LM to confirm appt d/t 11/06/14 10am. mailed cal to pt.

## 2014-10-10 ENCOUNTER — Other Ambulatory Visit (INDEPENDENT_AMBULATORY_CARE_PROVIDER_SITE_OTHER): Payer: Self-pay | Admitting: General Surgery

## 2014-10-10 ENCOUNTER — Ambulatory Visit (HOSPITAL_COMMUNITY)
Admission: RE | Admit: 2014-10-10 | Discharge: 2014-10-10 | Disposition: A | Discharge status: Home / Self Care | Payer: Medicare Other | Source: Ambulatory Visit | Attending: General Surgery | Admitting: General Surgery

## 2014-10-10 ENCOUNTER — Encounter (HOSPITAL_COMMUNITY): Payer: Self-pay | Admitting: *Deleted

## 2014-10-10 ENCOUNTER — Other Ambulatory Visit: Payer: Self-pay | Admitting: *Deleted

## 2014-10-10 ENCOUNTER — Telehealth: Payer: Self-pay | Admitting: *Deleted

## 2014-10-10 DIAGNOSIS — Z452 Encounter for adjustment and management of vascular access device: Secondary | ICD-10-CM | POA: Diagnosis not present

## 2014-10-10 DIAGNOSIS — C7A00 Malignant carcinoid tumor of unspecified site: Secondary | ICD-10-CM | POA: Diagnosis not present

## 2014-10-10 DIAGNOSIS — C7A8 Other malignant neuroendocrine tumors: Secondary | ICD-10-CM

## 2014-10-10 DIAGNOSIS — D3A098 Benign carcinoid tumors of other sites: Secondary | ICD-10-CM

## 2014-10-10 HISTORY — PX: PERIPHERALLY INSERTED CENTRAL CATHETER INSERTION: SHX2221

## 2014-10-10 MED ORDER — LIDOCAINE HCL 1 % IJ SOLN
INTRAMUSCULAR | Status: AC
  Filled 2014-10-10: qty 20

## 2014-10-10 MED ORDER — HEPARIN SOD (PORK) LOCK FLUSH 100 UNIT/ML IV SOLN
INTRAVENOUS | Status: AC
  Filled 2014-10-10: qty 5

## 2014-10-10 NOTE — Procedures (Signed)
Interventional Radiology Procedure Note  Procedure: Placement of a left Brachial vein PICC.  Tip is positioned at the superior cavoatrial junction and catheter is ready for immediate use.  41cm, Double lumen, power injectable.  Complications: No immediate Recommendations:  - Ok to shower tomorrow - Do not submerge for 7 days - Routine line care   Signed,  Dulcy Fanny. Earleen Newport, DO

## 2014-10-10 NOTE — Telephone Encounter (Signed)
Per POF and staff message I have schedueld appts. Patient called

## 2014-10-10 NOTE — Telephone Encounter (Signed)
Made patient aware that she needs IV infusion of aredia tomorrow for elevated calcium level. Her PICC is being placed today at [facility] at 3:30 pm. POF to scheduler for lab/Infusion tomorrow.

## 2014-10-11 ENCOUNTER — Other Ambulatory Visit: Payer: Self-pay | Admitting: Oncology

## 2014-10-11 ENCOUNTER — Inpatient Hospital Stay (HOSPITAL_COMMUNITY): Admission: RE | Admit: 2014-10-11 | Payer: Medicare Other | Source: Ambulatory Visit

## 2014-10-11 ENCOUNTER — Ambulatory Visit: Payer: Medicare Other

## 2014-10-11 ENCOUNTER — Encounter (HOSPITAL_COMMUNITY): Payer: Self-pay | Admitting: Pharmacy Technician

## 2014-10-11 ENCOUNTER — Encounter (HOSPITAL_COMMUNITY): Payer: Self-pay | Admitting: *Deleted

## 2014-10-11 ENCOUNTER — Ambulatory Visit (HOSPITAL_BASED_OUTPATIENT_CLINIC_OR_DEPARTMENT_OTHER): Payer: Medicare Other

## 2014-10-11 ENCOUNTER — Encounter (INDEPENDENT_AMBULATORY_CARE_PROVIDER_SITE_OTHER): Payer: Self-pay | Admitting: General Surgery

## 2014-10-11 ENCOUNTER — Other Ambulatory Visit (HOSPITAL_BASED_OUTPATIENT_CLINIC_OR_DEPARTMENT_OTHER): Payer: Medicare Other

## 2014-10-11 DIAGNOSIS — D3A098 Benign carcinoid tumors of other sites: Secondary | ICD-10-CM

## 2014-10-11 DIAGNOSIS — D638 Anemia in other chronic diseases classified elsewhere: Secondary | ICD-10-CM

## 2014-10-11 DIAGNOSIS — Z95828 Presence of other vascular implants and grafts: Secondary | ICD-10-CM

## 2014-10-11 LAB — COMPREHENSIVE METABOLIC PANEL (CC13)
ALT: 28 U/L (ref 0–55)
ANION GAP: 10 meq/L (ref 3–11)
AST: 36 U/L — AB (ref 5–34)
Albumin: 2.2 g/dL — ABNORMAL LOW (ref 3.5–5.0)
Alkaline Phosphatase: 245 U/L — ABNORMAL HIGH (ref 40–150)
BILIRUBIN TOTAL: 0.35 mg/dL (ref 0.20–1.20)
BUN: 77.9 mg/dL — ABNORMAL HIGH (ref 7.0–26.0)
CO2: 28 mEq/L (ref 22–29)
CREATININE: 3.1 mg/dL — AB (ref 0.6–1.1)
Calcium: 10 mg/dL (ref 8.4–10.4)
Chloride: 94 mEq/L — ABNORMAL LOW (ref 98–109)
Glucose: 125 mg/dl (ref 70–140)
Potassium: 4.7 mEq/L (ref 3.5–5.1)
Sodium: 131 mEq/L — ABNORMAL LOW (ref 136–145)
Total Protein: 6.4 g/dL (ref 6.4–8.3)

## 2014-10-11 MED ORDER — HEPARIN SOD (PORK) LOCK FLUSH 100 UNIT/ML IV SOLN
250.0000 [IU] | Freq: Once | INTRAVENOUS | Status: AC | PRN
  Administered 2014-10-11: 250 [IU]
  Filled 2014-10-11: qty 5

## 2014-10-11 MED ORDER — SODIUM CHLORIDE 0.9 % IJ SOLN
10.0000 mL | INTRAMUSCULAR | Status: DC | PRN
  Administered 2014-10-11: 10 mL via INTRAVENOUS
  Filled 2014-10-11: qty 10

## 2014-10-11 MED ORDER — SODIUM CHLORIDE 0.9 % IJ SOLN
3.0000 mL | Freq: Once | INTRAMUSCULAR | Status: AC | PRN
  Administered 2014-10-11: 3 mL via INTRAVENOUS
  Filled 2014-10-11: qty 10

## 2014-10-11 MED ORDER — SODIUM CHLORIDE 0.9 % IV SOLN
Freq: Once | INTRAVENOUS | Status: AC
  Administered 2014-10-11: 12:00:00 via INTRAVENOUS

## 2014-10-11 MED ORDER — SODIUM CHLORIDE 0.9 % IV SOLN
90.0000 mg | Freq: Once | INTRAVENOUS | Status: AC
  Administered 2014-10-11: 90 mg via INTRAVENOUS
  Filled 2014-10-11: qty 10

## 2014-10-11 NOTE — Progress Notes (Signed)
Patient ID: Lori Dennis, female   DOB: 11-06-1946, 68 y.o.   MRN: [id number] Spoke with her daughter about the Port-a-cath removal Monday and the reason for using the PICC and not placing another Port-a-cath right away.

## 2014-10-11 NOTE — Patient Instructions (Signed)
Pamidronate injection (Aredia) What is this medicine? PAMIDRONATE (pa mi DROE nate) slows calcium loss from bones. It is used to treat high calcium blood levels from cancer or Paget's disease. It is also used to treat bone pain and prevent fractures from certain cancers that have spread to the bone. This medicine may be used for other purposes; ask your health care provider or pharmacist if you have questions. COMMON BRAND NAME(S): Aredia What should I tell my health care provider before I take this medicine? They need to know if you have any of these conditions: -aspirin-sensitive asthma -dental disease -kidney disease -an unusual or allergic reaction to pamidronate, other medicines, foods, dyes, or preservatives -pregnant or trying to get pregnant -breast-feeding How should I use this medicine? This medicine is for infusion into a vein. It is given by a health care professional in a hospital or clinic setting. Talk to your pediatrician regarding the use of this medicine in children. This medicine is not approved for use in children. Overdosage: If you think you have taken too much of this medicine contact a poison control center or emergency room at once. NOTE: This medicine is only for you. Do not share this medicine with others. What if I miss a dose? This does not apply. What may interact with this medicine? -certain antibiotics given by injection -medicines for inflammation or pain like ibuprofen, naproxen -some diuretics like bumetanide, furosemide -cyclosporine -parathyroid hormone -tacrolimus -teriparatide -thalidomide This list may not describe all possible interactions. Give your health care provider a list of all the medicines, herbs, non-prescription drugs, or dietary supplements you use. Also tell them if you smoke, drink alcohol, or use illegal drugs. Some items may interact with your medicine. What should I watch for while using this medicine? Visit your doctor or  health care professional for regular checkups. It may be some time before you see the benefit from this medicine. Do not stop taking your medicine unless your doctor tells you to. Your doctor may order blood tests or other tests to see how you are doing. Women should inform their doctor if they wish to become pregnant or think they might be pregnant. There is a potential for serious side effects to an unborn child. Talk to your health care professional or pharmacist for more information. You should make sure that you get enough calcium and vitamin D while you are taking this medicine. Discuss the foods you eat and the vitamins you take with your health care professional. Some people who take this medicine have severe bone, joint, and/or muscle pain. This medicine may also increase your risk for a broken thigh bone. Tell your doctor right away if you have pain in your upper leg or groin. Tell your doctor if you have any pain that does not go away or that gets worse. What side effects may I notice from receiving this medicine? Side effects that you should report to your doctor or health care professional as soon as possible: -allergic reactions like skin rash, itching or hives, swelling of the face, lips, or tongue -black or tarry stools -changes in vision -eye inflammation, pain -high blood pressure -jaw pain, especially burning or cramping -muscle weakness -numb, tingling pain -swelling of feet or hands -trouble passing urine or change in the amount of urine -unable to move easily Side effects that usually do not require medical attention (report to your doctor or health care professional if they continue or are bothersome): -bone, joint, or muscle pain -constipation -dizzy,  drowsy -fever -headache -loss of appetite -nausea, vomiting -pain at site where injected This list may not describe all possible side effects. Call your doctor for medical advice about side effects. You may report side  effects to FDA at 1-800-FDA-1088. Where should I keep my medicine? This drug is given in a hospital or clinic and will not be stored at home. NOTE: This sheet is a summary. It may not cover all possible information. If you have questions about this medicine, talk to your doctor, pharmacist, or health care provider.  2015, Elsevier/Gold Standard. (2011-06-04 08:49:49)

## 2014-10-11 NOTE — Progress Notes (Signed)
Patient in for labs from PICC. All labs drawn from PICC today.

## 2014-10-11 NOTE — Patient Instructions (Addendum)
PICC Home Guide A peripherally inserted central catheter (PICC) is a long, thin, flexible tube that is inserted into a vein in the upper arm. It is a form of intravenous (IV) access. It is considered to be a "central" line because the tip of the PICC ends in a large vein in your chest. This large vein is called the superior vena cava (SVC). The PICC tip ends in the SVC because there is a lot of blood flow in the SVC. This allows medicines and IV fluids to be quickly distributed throughout the body. The PICC is inserted using a sterile technique by a specially trained nurse or physician. After the PICC is inserted, a chest X-ray exam is done to be sure it is in the correct place.  A PICC may be placed for different reasons, such as:  To give medicines and liquid nutrition that can only be given through a central line. Examples are:  Certain antibiotic treatments.  Chemotherapy.  Total parenteral nutrition (TPN).  To take frequent blood samples.  To give IV fluids and blood products.  If there is difficulty placing a peripheral intravenous (PIV) catheter. If taken care of properly, a PICC can remain in place for several months. A PICC can also allow a person to go home from the hospital early. Medicine and PICC care can be managed at home by a family member or home health care team. WHAT PROBLEMS CAN HAPPEN WHEN I HAVE A PICC? Problems with a PICC can occasionally occur. These may include the following:  A blood clot (thrombus) forming in or at the tip of the PICC. This can cause the PICC to become clogged. A clot-dissolving medicine called tissue plasminogen activator (tPA) can be given through the PICC to help break up the clot.  Inflammation of the vein (phlebitis) in which the PICC is placed. Signs of inflammation may include redness, pain at the insertion site, red streaks, or being able to feel a "cord" in the vein where the PICC is located.  Infection in the PICC or at the insertion  site. Signs of infection may include fever, chills, redness, swelling, or pus drainage from the PICC insertion site.  PICC movement (malposition). The PICC tip may move from its original position due to excessive physical activity, forceful coughing, sneezing, or vomiting.  A break or cut in the PICC. It is important to not use scissors near the PICC.  Nerve or tendon irritation or injury during PICC insertion. WHAT SHOULD I KEEP IN MIND ABOUT ACTIVITIES WHEN I HAVE A PICC?  You may bend your arm and move it freely. If your PICC is near or at the bend of your elbow, avoid activity with repeated motion at the elbow.  Rest at home for the remainder of the day following PICC line insertion.  Avoid lifting heavy objects as instructed by your health care provider.  Avoid using a crutch with the arm on the same side as your PICC. You may need to use a walker. WHAT SHOULD I KNOW ABOUT MY PICC DRESSING?  Keep your PICC bandage (dressing) clean and dry to prevent infection.  Ask your health care provider when you may shower. Ask your health care provider to teach you how to wrap the PICC when you do take a shower.  Change the PICC dressing as instructed by your health care provider.  Change your PICC dressing if it becomes loose or wet. WHAT SHOULD I KNOW ABOUT PICC CARE?  Check the PICC insertion site   daily for leakage, redness, swelling, or pain.  Do not take a bath, swim, or use hot tubs when you have a PICC. Cover PICC line with clear plastic wrap and tape to keep it dry while showering.  Flush the PICC as directed by your health care provider. Let your health care provider know right away if the PICC is difficult to flush or does not flush. Do not use force to flush the PICC.  Do not use a syringe that is less than 10 mL to flush the PICC.  Never pull or tug on the PICC.  Avoid blood pressure checks on the arm with the PICC.  Keep your PICC identification card with you at all  times.  Do not take the PICC out yourself. Only a trained clinical professional should remove the PICC. SEEK IMMEDIATE MEDICAL CARE IF:  Your PICC is accidentally pulled all the way out. If this happens, cover the insertion site with a bandage or gauze dressing. Do not throw the PICC away. Your health care provider will need to inspect it.  Your PICC was tugged or pulled and has partially come out. Do not  push the PICC back in.  There is any type of drainage, redness, or swelling where the PICC enters the skin.  You cannot flush the PICC, it is difficult to flush, or the PICC leaks around the insertion site when it is flushed.  You hear a "flushing" sound when the PICC is flushed.  You have pain, discomfort, or numbness in your arm, shoulder, or jaw on the same side as the PICC.  You feel your heart "racing" or skipping beats.  You notice a hole or tear in the PICC.  You develop chills or a fever. MAKE SURE YOU:   Understand these instructions.  Will watch your condition.  Will get help right away if you are not doing well or get worse. Document Released: 06/12/2003 Document Revised: 04/22/2014 Document Reviewed: 08/13/2013 ExitCare Patient Information 2015 [employer]. This information is not intended to replace advice given to you by your health care provider. Make sure you discuss any questions you have with your health care provider.  

## 2014-10-14 ENCOUNTER — Encounter (HOSPITAL_COMMUNITY): Payer: Self-pay

## 2014-10-14 ENCOUNTER — Ambulatory Visit (HOSPITAL_COMMUNITY): Payer: Medicare Other | Admitting: Anesthesiology

## 2014-10-14 ENCOUNTER — Encounter (HOSPITAL_COMMUNITY): Payer: Medicare Other | Admitting: Anesthesiology

## 2014-10-14 ENCOUNTER — Other Ambulatory Visit (INDEPENDENT_AMBULATORY_CARE_PROVIDER_SITE_OTHER): Payer: Self-pay | Admitting: General Surgery

## 2014-10-14 ENCOUNTER — Ambulatory Visit (HOSPITAL_COMMUNITY)
Admission: RE | Admit: 2014-10-14 | Discharge: 2014-10-14 | Disposition: A | Discharge status: Home / Self Care | Payer: Medicare Other | Source: Ambulatory Visit | Attending: General Surgery | Admitting: General Surgery

## 2014-10-14 ENCOUNTER — Ambulatory Visit (HOSPITAL_COMMUNITY): Payer: Medicare Other

## 2014-10-14 ENCOUNTER — Encounter (HOSPITAL_COMMUNITY)
Admission: RE | Disposition: A | Discharge status: Home / Self Care | Payer: Self-pay | Source: Ambulatory Visit | Attending: General Surgery

## 2014-10-14 DIAGNOSIS — I129 Hypertensive chronic kidney disease with stage 1 through stage 4 chronic kidney disease, or unspecified chronic kidney disease: Secondary | ICD-10-CM | POA: Insufficient documentation

## 2014-10-14 DIAGNOSIS — T80218A Other infection due to central venous catheter, initial encounter: Secondary | ICD-10-CM | POA: Insufficient documentation

## 2014-10-14 DIAGNOSIS — B999 Unspecified infectious disease: Secondary | ICD-10-CM | POA: Insufficient documentation

## 2014-10-14 DIAGNOSIS — Z931 Gastrostomy status: Secondary | ICD-10-CM | POA: Diagnosis not present

## 2014-10-14 DIAGNOSIS — I10 Essential (primary) hypertension: Secondary | ICD-10-CM

## 2014-10-14 DIAGNOSIS — C7A020 Malignant carcinoid tumor of the appendix: Secondary | ICD-10-CM | POA: Insufficient documentation

## 2014-10-14 DIAGNOSIS — K5669 Other intestinal obstruction: Secondary | ICD-10-CM | POA: Insufficient documentation

## 2014-10-14 DIAGNOSIS — Z932 Ileostomy status: Secondary | ICD-10-CM | POA: Insufficient documentation

## 2014-10-14 DIAGNOSIS — N189 Chronic kidney disease, unspecified: Secondary | ICD-10-CM | POA: Diagnosis not present

## 2014-10-14 HISTORY — PX: PORT-A-CATH REMOVAL: SHX5289

## 2014-10-14 LAB — POCT I-STAT 4, (NA,K, GLUC, HGB,HCT)
Glucose, Bld: 92 mg/dL (ref 70–99)
HCT: 25 % — ABNORMAL LOW (ref 36.0–46.0)
Hemoglobin: 8.5 g/dL — ABNORMAL LOW (ref 12.0–15.0)
Potassium: 4.4 mEq/L (ref 3.7–5.3)
SODIUM: 138 meq/L (ref 137–147)

## 2014-10-14 SURGERY — REMOVAL PORT-A-CATH
Anesthesia: Monitor Anesthesia Care

## 2014-10-14 MED ORDER — OXYCODONE HCL 5 MG PO TABS: 5.0000 mg | ORAL_TABLET | ORAL | Status: DC | PRN

## 2014-10-14 MED ORDER — CEFAZOLIN SODIUM-DEXTROSE 2-3 GM-% IV SOLR
2.0000 g | INTRAVENOUS | Status: AC
  Administered 2014-10-14: 2 g via INTRAVENOUS

## 2014-10-14 MED ORDER — CEFAZOLIN SODIUM-DEXTROSE 2-3 GM-% IV SOLR
INTRAVENOUS | Status: AC
  Filled 2014-10-14: qty 50

## 2014-10-14 MED ORDER — PROPOFOL 10 MG/ML IV BOLUS
INTRAVENOUS | Status: AC
  Filled 2014-10-14: qty 20

## 2014-10-14 MED ORDER — LACTATED RINGERS IV SOLN
INTRAVENOUS | Status: DC | PRN
  Administered 2014-10-14: 07:00:00 via INTRAVENOUS

## 2014-10-14 MED ORDER — MIDAZOLAM HCL 5 MG/5ML IJ SOLN
INTRAMUSCULAR | Status: DC | PRN
  Administered 2014-10-14: 1 mg via INTRAVENOUS

## 2014-10-14 MED ORDER — HEPARIN SOD (PORK) LOCK FLUSH 100 UNIT/ML IV SOLN: 250.0000 [IU] | INTRAVENOUS | Status: DC | PRN

## 2014-10-14 MED ORDER — ACETAMINOPHEN 650 MG RE SUPP
650.0000 mg | RECTAL | Status: DC | PRN
  Filled 2014-10-14: qty 1

## 2014-10-14 MED ORDER — BUPIVACAINE-EPINEPHRINE 0.25% -1:200000 IJ SOLN
INTRAMUSCULAR | Status: DC | PRN
  Administered 2014-10-14: 3 mL

## 2014-10-14 MED ORDER — FENTANYL CITRATE 0.05 MG/ML IJ SOLN
INTRAMUSCULAR | Status: DC | PRN
  Administered 2014-10-14 (×4): 25 ug via INTRAVENOUS

## 2014-10-14 MED ORDER — PROPOFOL INFUSION 10 MG/ML OPTIME
INTRAVENOUS | Status: DC | PRN
Start: 2014-10-14 — End: 2014-10-14
  Administered 2014-10-14: 200 ug/kg/min via INTRAVENOUS

## 2014-10-14 MED ORDER — METOCLOPRAMIDE HCL 5 MG/ML IJ SOLN
INTRAMUSCULAR | Status: DC | PRN
  Administered 2014-10-14: 10 mg via INTRAVENOUS

## 2014-10-14 MED ORDER — SODIUM CHLORIDE 0.9 % IJ SOLN: 3.0000 mL | INTRAMUSCULAR | Status: DC | PRN

## 2014-10-14 MED ORDER — ACETAMINOPHEN 325 MG PO TABS: 650.0000 mg | ORAL_TABLET | ORAL | Status: DC | PRN

## 2014-10-14 MED ORDER — LIDOCAINE HCL 1 % IJ SOLN
INTRAMUSCULAR | Status: DC | PRN
  Administered 2014-10-14: 3 mL

## 2014-10-14 MED ORDER — BUPIVACAINE-EPINEPHRINE (PF) 0.25% -1:200000 IJ SOLN
INTRAMUSCULAR | Status: AC
  Filled 2014-10-14: qty 30

## 2014-10-14 MED ORDER — METOCLOPRAMIDE HCL 5 MG/ML IJ SOLN
INTRAMUSCULAR | Status: AC
  Filled 2014-10-14: qty 2

## 2014-10-14 MED ORDER — FENTANYL CITRATE 0.05 MG/ML IJ SOLN: 25.0000 ug | INTRAMUSCULAR | Status: DC | PRN

## 2014-10-14 MED ORDER — ONDANSETRON HCL 4 MG/2ML IJ SOLN: 4.0000 mg | Freq: Once | INTRAMUSCULAR | Status: DC | PRN

## 2014-10-14 MED ORDER — HEPARIN SOD (PORK) LOCK FLUSH 100 UNIT/ML IV SOLN
250.0000 [IU] | INTRAVENOUS | Status: AC | PRN
  Administered 2014-10-14: 250 [IU]
  Filled 2014-10-14: qty 3

## 2014-10-14 MED ORDER — FENTANYL CITRATE 0.05 MG/ML IJ SOLN
INTRAMUSCULAR | Status: AC
  Filled 2014-10-14: qty 2

## 2014-10-14 MED ORDER — MIDAZOLAM HCL 2 MG/2ML IJ SOLN
INTRAMUSCULAR | Status: AC
  Filled 2014-10-14: qty 2

## 2014-10-14 MED ORDER — ONDANSETRON HCL 4 MG/2ML IJ SOLN
INTRAMUSCULAR | Status: AC
  Filled 2014-10-14: qty 2

## 2014-10-14 MED ORDER — LIDOCAINE HCL 1 % IJ SOLN
INTRAMUSCULAR | Status: AC
  Filled 2014-10-14: qty 20

## 2014-10-14 MED ORDER — ONDANSETRON HCL 4 MG/2ML IJ SOLN
INTRAMUSCULAR | Status: DC | PRN
  Administered 2014-10-14: 4 mg via INTRAVENOUS

## 2014-10-14 MED ORDER — SODIUM CHLORIDE 0.9 % IV SOLN
INTRAVENOUS | Status: DC | PRN
  Administered 2014-10-14: 08:00:00 via INTRAVENOUS

## 2014-10-14 SURGICAL SUPPLY — 34 items
BENZOIN TINCTURE PRP APPL 2/3 (GAUZE/BANDAGES/DRESSINGS) ×3 IMPLANT
BLADE HEX COATED 2.75 (ELECTRODE) ×3 IMPLANT
BLADE SURG 15 STRL LF DISP TIS (BLADE) ×1 IMPLANT
BLADE SURG 15 STRL SS (BLADE) ×2
CLOSURE WOUND 1/2 X4 (GAUZE/BANDAGES/DRESSINGS) ×1
DECANTER SPIKE VIAL GLASS SM (MISCELLANEOUS) ×3 IMPLANT
DRAPE LAPAROTOMY TRNSV 102X78 (DRAPE) ×3 IMPLANT
DRSG TEGADERM 6X8 (GAUZE/BANDAGES/DRESSINGS) IMPLANT
ELECT REM PT RETURN 9FT ADLT (ELECTROSURGICAL) ×3
ELECTRODE REM PT RTRN 9FT ADLT (ELECTROSURGICAL) ×1 IMPLANT
GAUZE SPONGE 4X4 12PLY STRL (GAUZE/BANDAGES/DRESSINGS) IMPLANT
GAUZE SPONGE 4X4 16PLY XRAY LF (GAUZE/BANDAGES/DRESSINGS) ×3 IMPLANT
GLOVE BIOGEL PI IND STRL 7.0 (GLOVE) ×1 IMPLANT
GLOVE BIOGEL PI INDICATOR 7.0 (GLOVE) ×2
GLOVE ECLIPSE 8.0 STRL XLNG CF (GLOVE) ×3 IMPLANT
GLOVE INDICATOR 8.0 STRL GRN (GLOVE) ×6 IMPLANT
GOWN STRL REUS W/TWL LRG LVL3 (GOWN DISPOSABLE) ×3 IMPLANT
GOWN STRL REUS W/TWL XL LVL3 (GOWN DISPOSABLE) ×6 IMPLANT
KIT BASIN OR (CUSTOM PROCEDURE TRAY) ×3 IMPLANT
NEEDLE HYPO 22GX1.5 SAFETY (NEEDLE) IMPLANT
NEEDLE HYPO 25X1 1.5 SAFETY (NEEDLE) IMPLANT
NS IRRIG 1000ML POUR BTL (IV SOLUTION) ×3 IMPLANT
PACK BASIC VI WITH GOWN DISP (CUSTOM PROCEDURE TRAY) ×3 IMPLANT
PAD ABD 8X10 STRL (GAUZE/BANDAGES/DRESSINGS) ×3 IMPLANT
PENCIL BUTTON HOLSTER BLD 10FT (ELECTRODE) ×3 IMPLANT
STRIP CLOSURE SKIN 1/2X4 (GAUZE/BANDAGES/DRESSINGS) ×2 IMPLANT
SUT MNCRL AB 4-0 PS2 18 (SUTURE) IMPLANT
SUT VIC AB 2-0 SH 27 (SUTURE)
SUT VIC AB 2-0 SH 27X BRD (SUTURE) IMPLANT
SUT VIC AB 3-0 SH 27 (SUTURE)
SUT VIC AB 3-0 SH 27XBRD (SUTURE) IMPLANT
SYR BULB IRRIGATION 50ML (SYRINGE) IMPLANT
SYR CONTROL 10ML LL (SYRINGE) ×3 IMPLANT
TOWEL OR 17X26 10 PK STRL BLUE (TOWEL DISPOSABLE) ×3 IMPLANT

## 2014-10-14 NOTE — H&P (Signed)
Lori Dennis is an 68 y.o. female.   Chief Complaint:   Here for Port-a-cath removal HPI: She is on long-term TPN and has a right sided Port-a-cath that has eroded through the skin and become chronically infected.  She now has a PICC in her left arm.  Past Medical History  Diagnosis Date  . Complication of anesthesia     slow to wake up  . Blood transfusion without reported diagnosis 2009 and 2011  . Hypertension     off all bp meds since Dec 20, 2012  . Transfusion history     last transfused 05-31-14(2 units)  . Chronic kidney disease 12-19-2012    elevated bun and creatinine   . Portacath in place     right chest.  . Gastrostomy tube in place     palliative gastrostomy since 8'14 Mid abdomen "at waist level""gastric residual"  . Hx of nephrostomy     05-31-14 Left flank area to straight drainage continuosly, last changed 07-11-14. Void very little to none.  . Ileostomy in place     4'09 left abdomen  . On total parenteral nutrition (TPN)     at home through portacath daily 2000 x 14 hours. 24/7  . Cancer 02/19/2008    Metastatic carcinoid tumor(appendix area origin)    Past Surgical History  Procedure Laterality Date  . Bowel resection  06/08/2010    ileocolonic resection, enteroenterostomy  . Left colectomy  04/02/2008    Exploratory laparotomy, biopsy of omental nodule, sigmoid colectomy  . Vaginal hysterectomy      at age 37  . Cholecystectomy      age 76  . Portacath placement Right 02/06/2013    Procedure: Ultrasound guided PORT-A-CATH INSERTION with flouro;  Surgeon: [name], MD;  Location: [facility];  Service: General;  Laterality: Right;  . Esophagogastroduodenoscopy N/A 06/28/2013    Procedure: ESOPHAGOGASTRODUODENOSCOPY (EGD);  Surgeon: Winfield Cunas., MD;  Location: [facility];  Service: Endoscopy;  Laterality: N/A;  . Cystoscopy with retrograde pyelogram, ureteroscopy and stent placement Bilateral 04/08/2014    Procedure: CYSTOSCOPY WITH BILATERAL   RETROGRADE PYELOGRAM,  AND  BILATERAL  STENT PLACEMENT;  Surgeon: Irine Seal, MD;  Location: [facility];  Service: Urology;  Laterality: Bilateral;  . Cystoscopy with retrograde pyelogram, ureteroscopy and stent placement Bilateral 05/03/2014    Procedure: CYSTOSCOPY WITH BILATERAL DOUBLE J STENT EXCHANGE, LEFT URETEROSCOPY;  Surgeon: Bernestine Amass, MD;  Location: [facility];  Service: Urology;  Laterality: Bilateral;  . Cystoscopy w/ ureteral stent placement Bilateral 08/06/2014    Procedure: CYSTOSCOPY WITH RETROGRADE PYELOGRAM BILATERAL STENT EXCHANGE;  Surgeon: Malka So, MD;  Location: [facility];  Service: Urology;  Laterality: Bilateral;  . Peripherally inserted central catheter insertion  10-10-14    Family History  Problem Relation Age of Onset  . Heart disease Mother   . Cancer Sister     ovarian   Social History:  reports that she has never smoked. She has never used smokeless tobacco. She reports that she does not drink alcohol or use illicit drugs.  Allergies: No Known Allergies  Medications Prior to Admission  Medication Sig Dispense Refill  . amLODipine (NORVASC) 5 MG tablet Take 5 mg by mouth every morning.      Marland Kitchen antiseptic oral rinse (BIOTENE) LIQD 15 mLs by Mouth Rinse route as needed (for dry mouth).      . fat emulsion 20 % infusion Inject 100 mLs into the vein 2 (two) times  a week. Wed and Sat      . ondansetron (ZOFRAN) 4 MG/2ML SOLN injection Inject 4 mg into the vein every 6 (six) hours as needed for nausea or vomiting.      Marland Kitchen oxyCODONE (ROXICODONE INTENSOL) 20 MG/ML concentrated solution Take 0.5 mLs (10 mg total) by mouth every 4 (four) hours as needed for moderate pain or severe pain.  30 mL  0  . TPN ADULT Inject 2,000 mLs into the vein continuous. 14 hours/day start between 2000-2100 Five days a week With MVI      . traMADol (ULTRAM) 50 MG tablet Take 50 mg by mouth every 6 (six) hours as needed for moderate pain.      . fluconazole (DIFLUCAN) 50 MG tablet Take 1 tablet  (50 mg total) by mouth daily. X 7 days  7 tablet  0    Results for orders placed during the hospital encounter of 10/14/14 (from the past 48 hour(s))  POCT I-STAT 4, (NA,K, GLUC, HGB,HCT)     Status: Abnormal   Collection Time    10/14/14  7:35 AM      Result Value Ref Range   Sodium 138  137 - 147 mEq/L   Potassium 4.4  3.7 - 5.3 mEq/L   Glucose, Bld 92  70 - 99 mg/dL   HCT 25.0 (*) 36.0 - 46.0 %   Hemoglobin 8.5 (*) 12.0 - 15.0 g/dL   No results found.  Review of Systems  Gastrointestinal: Positive for nausea and vomiting.    Blood pressure 130/82, pulse 88, temperature 98.2 F (36.8 C), temperature source Oral, resp. rate 16, SpO2 100.00%. Physical Exam  Constitutional: No distress.  Thin female.  Cardiovascular: Normal rate and regular rhythm.   Respiratory: Effort normal and breath sounds normal.  Port in right upper chest wall with redness and an area of skin erosion at the site.  GI: Soft.  G-tube in.  Left sided colostomy.     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  Plan:  PAC removal.  Let wound heal in by secondary intention.  Necha Harries J 10/14/2014, 7:38 AM

## 2014-10-14 NOTE — Anesthesia Postprocedure Evaluation (Signed)
  Anesthesia Post-op Note  Patient: Lori Dennis  Procedure(s) Performed: Procedure(s) (LRB): REMOVAL PORT-A-CATH (N/A)  Patient Location: PACU  Anesthesia Type: MAC  Level of Consciousness: awake and alert   Airway and Oxygen Therapy: Patient Spontanous Breathing  Post-op Pain: mild  Post-op Assessment: Post-op Vital signs reviewed, Patient's Cardiovascular Status Stable, Respiratory Function Stable, Patent Airway and No signs of Nausea or vomiting  Last Vitals:  Filed Vitals:   10/14/14 0823  BP:   Pulse: 78  Temp: 36.4 C  Resp: 12    Post-op Vital Signs: stable   Complications: No apparent anesthesia complications

## 2014-10-14 NOTE — Interval H&P Note (Signed)
History and Physical Interval Note:  10/14/2014 7:41 AM  Lori Dennis  has presented today for surgery, with the diagnosis of infected Port a cath  The various methods of treatment have been discussed with the patient and family. After consideration of risks, benefits and other options for treatment, the patient has consented to  Procedure(s): REMOVAL PORT-A-CATH (N/A) as a surgical intervention .  The patient's history has been reviewed, patient examined, no change in status, stable for surgery.  I have reviewed the patient's chart and labs.  Questions were answered to the patient's satisfaction.     Alanee Ting Lenna Sciara

## 2014-10-14 NOTE — Op Note (Signed)
PREOPERATIVE DIAGNOSIS:  Infected Port-a-cath right chest wall  POSTOPERATIVE DIAGNOSIS:  Same  PROCEDURE:    Porta-cath removal  SURGEON:  Jackolyn Confer, M.D.  ANESTHESIA:  Local (Xylocaine and Marcaine mix) with MAC  INDICATION:  This is a 68 year old female with malignant carcinoid and a chronic SBO who is on chronic TPN via the Porta-cath.  The Porta-cath has eroded through the skin and become infected.  She now presents for removal.   TECHNIQUE:  The right upper chest wall and neck were sterilely prepped and draped. Local anesthetic was infiltrated at the site of the port in the chest wall superficially and deep. The previous scar was incised sharply and then using electrocautery the subcutaneous tissue was divided until the port and catheter were identified.  The fibrous sheath was dissected free from the catheter and it was pulled out of the internal jugular vein. Direct pressure was held over the vein site for 10-15 minutes.  The port was then dissected free from the chest wall using electrocautery. The port and catheter were removed intact. The chest wall area was inspected and bleeding controlled with electrocautery. Once hemostasis was adequate, the right upper chest wall wound was packed with saline moistened gauze followed by a dry dressing.  She tolerated the procedure well without any apparent complications and was taken to the recovery room in satisfactory condition.

## 2014-10-14 NOTE — Discharge Instructions (Addendum)
My office will arrange for home health nurses to come out tomorrow or Wednesday and start normal saline wet to dry dressing changes to the right chest wall wound.  Appointment in office in 2 weeks for a wound check.  Please call [phone] to make this appointment.   Conscious Sedation, Adult, Care After Refer to this sheet in the next few weeks. These instructions provide you with information on caring for yourself after your procedure. Your health care provider may also give you more specific instructions. Your treatment has been planned according to current medical practices, but problems sometimes occur. Call your health care provider if you have any problems or questions after your procedure. WHAT TO EXPECT AFTER THE PROCEDURE  After your procedure:  You may feel sleepy, clumsy, and have poor balance for several hours.  Vomiting may occur if you eat too soon after the procedure. HOME CARE INSTRUCTIONS  Do not participate in any activities where you could become injured for at least 24 hours. Do not:  Drive.  Swim.  Ride a bicycle.  Operate heavy machinery.  Cook.  Use power tools.  Climb ladders.  Work from a high place.  Do not make important decisions or sign legal documents until you are improved.  If you vomit, drink water, juice, or soup when you can drink without vomiting. Make sure you have little or no nausea before eating solid foods.  Only take over-the-counter or prescription medicines for pain, discomfort, or fever as directed by your health care provider.  Make sure you and your family fully understand everything about the medicines given to you, including what side effects may occur.  You should not drink alcohol, take sleeping pills, or take medicines that cause drowsiness for at least 24 hours.  If you smoke, do not smoke without supervision.  If you are feeling better, you may resume normal activities 24 hours after you were sedated.  Keep all  appointments with your health care provider. SEEK MEDICAL CARE IF:  Your skin is pale or bluish in color.  You continue to feel nauseous or vomit.  Your pain is getting worse and is not helped by medicine.  You have bleeding or swelling.  You are still sleepy or feeling clumsy after 24 hours. SEEK IMMEDIATE MEDICAL CARE IF:  You develop a rash.  You have difficulty breathing.  You develop any type of allergic problem.  You have a fever. MAKE SURE YOU:  Understand these instructions.  Will watch your condition.  Will get help right away if you are not doing well or get worse. Document Released: 09/26/2013 Document Reviewed: 09/26/2013 [facility] Patient Information 2015 [place], [place]. This information is not intended to replace advice given to you by your health care provider. Make sure you discuss any questions you have with your health care provider.

## 2014-10-14 NOTE — Anesthesia Preprocedure Evaluation (Addendum)
Anesthesia Evaluation  Patient identified by MRN, date of birth, ID band Patient awake    Reviewed: Allergy & Precautions, H&P , NPO status , Patient's Chart, lab work & pertinent test results  Airway Mallampati: II TM Distance: >3 FB Neck ROM: Full    Dental no notable dental hx.    Pulmonary neg pulmonary ROS,  breath sounds clear to auscultation  Pulmonary exam normal       Cardiovascular hypertension, Pt. on medications Rhythm:Regular Rate:Normal     Neuro/Psych negative neurological ROS  negative psych ROS   GI/Hepatic negative GI ROS, Neg liver ROS, GERD-  Medicated and Controlled,  Endo/Other  negative endocrine ROS  Renal/GU CRFRenal disease  negative genitourinary   Musculoskeletal negative musculoskeletal ROS (+)   Abdominal   Peds negative pediatric ROS (+)  Hematology negative hematology ROS (+)   Anesthesia Other Findings Metastatic carcinoid  Reproductive/Obstetrics negative OB ROS                          Anesthesia Physical Anesthesia Plan  ASA: III  Anesthesia Plan: MAC   Post-op Pain Management:    Induction: Intravenous  Airway Management Planned: Nasal Cannula  Additional Equipment:   Intra-op Plan:   Post-operative Plan: Extubation in OR  Informed Consent: I have reviewed the patients History and Physical, chart, labs and discussed the procedure including the risks, benefits and alternatives for the proposed anesthesia with the patient or authorized representative who has indicated his/her understanding and acceptance.   Dental advisory given  Plan Discussed with: CRNA  Anesthesia Plan Comments:         Anesthesia Quick Evaluation

## 2014-10-14 NOTE — Transfer of Care (Signed)
Immediate Anesthesia Transfer of Care Note  Patient: Lori Dennis  Procedure(s) Performed: Procedure(s): REMOVAL PORT-A-CATH (N/A)  Patient Location: PACU  Anesthesia Type:MAC  Level of Consciousness: Patient easily awoken, sedated, comfortable, cooperative, following commands, responds to stimulation.   Airway & Oxygen Therapy: Patient spontaneously breathing, ventilating well, oxygen via simple oxygen mask.  Post-op Assessment: Report given to PACU RN, vital signs reviewed and stable, moving all extremities.   Post vital signs: Reviewed and stable.  Complications: No apparent anesthesia complications

## 2014-10-15 ENCOUNTER — Encounter (HOSPITAL_COMMUNITY): Payer: Self-pay | Admitting: General Surgery

## 2014-10-21 ENCOUNTER — Encounter: Payer: Self-pay | Admitting: *Deleted

## 2014-10-21 NOTE — Progress Notes (Signed)
Received labs from [facility]: Hgb stable at 8.5. Creatinine improved at 1.54/BUN 41. Copy to MD desk.

## 2014-11-01 ENCOUNTER — Encounter: Payer: Self-pay | Admitting: Oncology

## 2014-11-06 ENCOUNTER — Other Ambulatory Visit: Payer: Self-pay | Admitting: *Deleted

## 2014-11-06 ENCOUNTER — Telehealth: Payer: Self-pay | Admitting: Oncology

## 2014-11-06 ENCOUNTER — Ambulatory Visit (HOSPITAL_BASED_OUTPATIENT_CLINIC_OR_DEPARTMENT_OTHER): Payer: Medicare Other | Admitting: Oncology

## 2014-11-06 VITALS — BP 143/84 | HR 97 | Temp 97.8°F | Resp 17 | Ht 63.5 in | Wt 109.5 lb

## 2014-11-06 DIAGNOSIS — C7B09 Secondary carcinoid tumors of other sites: Secondary | ICD-10-CM

## 2014-11-06 DIAGNOSIS — D3A098 Benign carcinoid tumors of other sites: Secondary | ICD-10-CM

## 2014-11-06 DIAGNOSIS — D631 Anemia in chronic kidney disease: Secondary | ICD-10-CM

## 2014-11-06 DIAGNOSIS — C7A00 Malignant carcinoid tumor of unspecified site: Secondary | ICD-10-CM

## 2014-11-06 DIAGNOSIS — N189 Chronic kidney disease, unspecified: Secondary | ICD-10-CM

## 2014-11-06 MED ORDER — LORAZEPAM 0.5 MG PO TABS: 0.5000 mg | ORAL_TABLET | Freq: Four times a day (QID) | ORAL | Status: AC | PRN

## 2014-11-06 MED ORDER — PROCHLORPERAZINE MALEATE 5 MG PO TABS: 5.0000 mg | ORAL_TABLET | Freq: Four times a day (QID) | ORAL | Status: DC | PRN

## 2014-11-06 NOTE — Progress Notes (Signed)
  [facility] OFFICE PROGRESS NOTE   Diagnosis: carcinoid tumor  INTERVAL HISTORY:   Lori Dennis returns as scheduled. She reports feeling better after receiving pamidronate for hypercalcemia last month. She continues to have abdominal pain relieved with oxycodone. She has more nausea at present. She uses IV Phenergan and this causes somnolence. Minimal output from the colostomy bag. She can clamp the gastrostomy tube for a period of time.  The right chest Port-A-Cath was removed by Dr. Zella Richer on 10/14/2014 secondary to an infection. The Port-A-Cath site has healed.  She continues TNA via a left arm PICC.  Objective:  Vital signs in last 24 hours:  Blood pressure 143/84, pulse 97, temperature 97.8 F (36.6 C), temperature source Oral, resp. rate 17, height 5' 3.5" (1.613 m), weight 109 lb 8 oz (49.669 kg), SpO2 95 %.    HEENT: whitecoat over the tongue, no buccal thrush Resp: lungs clear bilaterally Cardio: regular rate and rhythm GI: the left lower quadrant colostomy bag is empty. Mild erythema at the gastrostomy tube site. Firmness in the right lower abdomen Vascular: trace edema at the left lower leg   Portacath/PICC-mild erythema at the left PICC skin exit, the right upper chest Port-A-Cath site has healed  Lab Results: 11/01/2014-creatinine 1.86, BUN 42, calcium 9.3, albumin 2.5, hemoglobin 9.1 Medications: I have reviewed the patient's current medications.  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 Acute renal failure secondary to an obstructing pelvic mass and a left ureter stone, status post placement of bilateral ureter stents 04/08/2014   Recurrent acute renal failure 05/02/2014   Exchange of  bilateral ureter stents 05/03/2014, bilateral 7 French stent   Persistent bilateral hydronephrosis on a renal ultrasound 05/29/2014 and an abdominal CT 05/30/2014   Left percutaneous nephrostomy tube 05/31/2014   Placement of bilateral metal ureter stents 08/06/2049 3. history of Gross hematuria-likely secondary to the left ureter stone  4. Chronic left leg edema-negative Doppler 11/03/2012 and 05/03/2014  5. Nutrition-maintained on home TNA  6. Right scalp herpes zoster rash July 2013  7. Pain secondary to progressive carcinomatosis and urinary obstruction-she continues oxycodone as needed  8. anemia secondary to chronic disease, GU blood loss, and renal failure, status post a red cell transfusion 05/31/2014  9. Elevated calcium-potentially related to renal failure/TNA versus hypercalcemia malignancy. She received pamidronate 05/29/2014 and 10/11/2014 10. Hypertension -likely secondary to obstructive nephropathy, improved  11. Urinalysis 08/19/2014 with too numerous to count white cells, culture positive for Streptococcus viridans and a coagulase negative Staphylococcus, she was unable to tolerate Macrobid  12.  Removal of a right chest infected Port-A-Cath 10/14/2014    Disposition:  Ms. Paullin has increased nausea. This is likely secondary to carcinomatosis. She will try low-dose Compazine and Ativan.  She continues oxycodone for pain.  She remains on daily TNA via a left arm PICC.  She is scheduled to see Dr. Zella Richer within the next few weeks for placement of a new Port-A-Cath.  Ms. Odonnel will return for an office visit in one month.  Betsy Coder, MD  11/06/2014  11:19 AM

## 2014-11-06 NOTE — Telephone Encounter (Signed)
gave avs & cal for Dec. °

## 2014-11-20 ENCOUNTER — Encounter (INDEPENDENT_AMBULATORY_CARE_PROVIDER_SITE_OTHER): Payer: Self-pay | Admitting: General Surgery

## 2014-11-20 ENCOUNTER — Telehealth: Payer: Self-pay | Admitting: *Deleted

## 2014-11-20 DIAGNOSIS — D3A098 Benign carcinoid tumors of other sites: Secondary | ICD-10-CM

## 2014-11-20 NOTE — Progress Notes (Signed)
Patient ID: Lori Dennis, female   DOB: [birth year], 69 y.o.   MRN: [id number]  Lori Dennis 11/20/2014 10:31 AM Location: [facility] Patient #: [id number] DOB: 1946/07/06 Divorced / Language: Lori Dennis / Race: White Female History of Present Illness Lori Dennis; 11/20/2014 10:52 AM) Patient words: Wound check.  The patient is a 68 year old female    Note:She is here for a follow-up visit to check the infected Port-A-Cath wound in the right upper chest wall. The past 3 days have not been good for her as she has had increasing abdominal pain with some nausea and vomiting. She states the wound has healed. She has a PICC in her left arm.  Allergies (Lori Eversole, LPN; 76/06/2093 70:96 AM) No Known Drug Allergies 10/28/2014  Medication History (Lori Eversole, LPN; 28/02/6628 47:65 AM) Norvasc (5MG  Tablet, Oral) Active. Fat Emulsion (20% Emulsion, Intravenous) Active. Diflucan (50MG  Tablet, Oral) Active. Zofran ODT (4MG  Tablet Disperse, Oral prn) Active. Roxicodone (20MG /ML Concentrate, Oral prn) Active. TPN Electrolytes (Intravenous) Active. TraMADol HCl (50MG  Tablet, Oral) Active.    Vitals (Lori Eversole LPN; 46/04/353 65:68 AM) 11/20/2014 10:32 AM Height: 63in Temp.: 98.50F  Pulse: 110 (Regular)  Resp.: 14 (Unlabored)  BP: 152/96 (Sitting, Left Arm, Standard)     Physical Exam Lori Dennis; 11/20/2014 10:53 AM)  The physical exam findings are as follows: Note:General-thin, weak-appearing female in a wheelchair. Gastrostomy tube is hooked up to a drainage bag.  Chest-right upper chest wall wound is completely healed with no erythema.    Assessment & Plan Lori Dennis; 11/20/2014 10:54 AM)  PORT OR RESERVOIR INFECTION, SUBSEQUENT ENCOUNTER (V58.89  T80.212D) Impression: The open wound has completely healed and there are no further signs of infection.  Plan: We discussed insertion of a Port-A-Cath on  the left side. The procedure risks and aftercare been explained. Risks include but are not limited to bleeding, infection, malfunction, pneumothorax, wound problems, DVT. We will schedule this when she feels a little stronger.  Lori Confer, Dennis

## 2014-11-20 NOTE — Telephone Encounter (Signed)
Call from pt's daughter, Amy reporting increased pain since yesterday. Pt is "very sick" asking to bring her in to office to be seen. Reviewed with Drue Second, NP for [facility]. Order received to bring pt in for labs/ office and possible infusion. Unable to reach Amy. Called pt's home, spoke with [name]- pt declined appt. Stated she feels a little better. Instructed her to continue antiemetics and pain meds. Call office with update 12/3. She and daughter [name] voiced understanding.

## 2014-11-21 ENCOUNTER — Inpatient Hospital Stay (HOSPITAL_COMMUNITY): Payer: Medicare Other

## 2014-11-21 ENCOUNTER — Inpatient Hospital Stay (HOSPITAL_COMMUNITY)
Admission: AD | Admit: 2014-11-21 | Discharge: 2014-11-28 | DRG: 683 | Disposition: A | Discharge status: Skilled Nursing Facility | Payer: Medicare Other | Source: Ambulatory Visit | Attending: Oncology | Admitting: Oncology

## 2014-11-21 ENCOUNTER — Telehealth: Payer: Self-pay | Admitting: Nurse Practitioner

## 2014-11-21 ENCOUNTER — Ambulatory Visit (HOSPITAL_BASED_OUTPATIENT_CLINIC_OR_DEPARTMENT_OTHER): Payer: Medicare Other | Admitting: Nurse Practitioner

## 2014-11-21 ENCOUNTER — Telehealth: Payer: Self-pay | Admitting: *Deleted

## 2014-11-21 ENCOUNTER — Ambulatory Visit (HOSPITAL_BASED_OUTPATIENT_CLINIC_OR_DEPARTMENT_OTHER): Payer: Medicare Other

## 2014-11-21 ENCOUNTER — Other Ambulatory Visit (HOSPITAL_BASED_OUTPATIENT_CLINIC_OR_DEPARTMENT_OTHER): Payer: Medicare Other

## 2014-11-21 ENCOUNTER — Encounter (HOSPITAL_COMMUNITY): Payer: Self-pay | Admitting: Internal Medicine

## 2014-11-21 VITALS — BP 154/105 | HR 122 | Temp 98.4°F | Resp 18 | Ht 63.5 in | Wt 119.7 lb

## 2014-11-21 DIAGNOSIS — R600 Localized edema: Secondary | ICD-10-CM

## 2014-11-21 DIAGNOSIS — K5669 Other intestinal obstruction: Secondary | ICD-10-CM

## 2014-11-21 DIAGNOSIS — E860 Dehydration: Secondary | ICD-10-CM | POA: Diagnosis present

## 2014-11-21 DIAGNOSIS — Z933 Colostomy status: Secondary | ICD-10-CM | POA: Diagnosis not present

## 2014-11-21 DIAGNOSIS — N201 Calculus of ureter: Secondary | ICD-10-CM | POA: Diagnosis present

## 2014-11-21 DIAGNOSIS — C786 Secondary malignant neoplasm of retroperitoneum and peritoneum: Secondary | ICD-10-CM | POA: Diagnosis present

## 2014-11-21 DIAGNOSIS — R109 Unspecified abdominal pain: Secondary | ICD-10-CM | POA: Diagnosis present

## 2014-11-21 DIAGNOSIS — C7A029 Malignant carcinoid tumor of the large intestine, unspecified portion: Secondary | ICD-10-CM | POA: Diagnosis present

## 2014-11-21 DIAGNOSIS — D638 Anemia in other chronic diseases classified elsewhere: Secondary | ICD-10-CM | POA: Diagnosis present

## 2014-11-21 DIAGNOSIS — E870 Hyperosmolality and hypernatremia: Secondary | ICD-10-CM | POA: Diagnosis present

## 2014-11-21 DIAGNOSIS — Z931 Gastrostomy status: Secondary | ICD-10-CM | POA: Diagnosis not present

## 2014-11-21 DIAGNOSIS — E875 Hyperkalemia: Secondary | ICD-10-CM

## 2014-11-21 DIAGNOSIS — N179 Acute kidney failure, unspecified: Secondary | ICD-10-CM

## 2014-11-21 DIAGNOSIS — I129 Hypertensive chronic kidney disease with stage 1 through stage 4 chronic kidney disease, or unspecified chronic kidney disease: Secondary | ICD-10-CM | POA: Diagnosis present

## 2014-11-21 DIAGNOSIS — N184 Chronic kidney disease, stage 4 (severe): Secondary | ICD-10-CM | POA: Diagnosis present

## 2014-11-21 DIAGNOSIS — T80219A Unspecified infection due to central venous catheter, initial encounter: Secondary | ICD-10-CM | POA: Diagnosis present

## 2014-11-21 DIAGNOSIS — Z515 Encounter for palliative care: Secondary | ICD-10-CM | POA: Diagnosis not present

## 2014-11-21 DIAGNOSIS — K5660 Unspecified intestinal obstruction: Secondary | ICD-10-CM | POA: Diagnosis present

## 2014-11-21 DIAGNOSIS — R112 Nausea with vomiting, unspecified: Secondary | ICD-10-CM

## 2014-11-21 DIAGNOSIS — Y848 Other medical procedures as the cause of abnormal reaction of the patient, or of later complication, without mention of misadventure at the time of the procedure: Secondary | ICD-10-CM | POA: Diagnosis present

## 2014-11-21 DIAGNOSIS — Z79899 Other long term (current) drug therapy: Secondary | ICD-10-CM | POA: Diagnosis not present

## 2014-11-21 DIAGNOSIS — E162 Hypoglycemia, unspecified: Secondary | ICD-10-CM | POA: Diagnosis present

## 2014-11-21 DIAGNOSIS — G893 Neoplasm related pain (acute) (chronic): Secondary | ICD-10-CM | POA: Diagnosis present

## 2014-11-21 DIAGNOSIS — R1030 Lower abdominal pain, unspecified: Secondary | ICD-10-CM

## 2014-11-21 DIAGNOSIS — I10 Essential (primary) hypertension: Secondary | ICD-10-CM

## 2014-11-21 DIAGNOSIS — N189 Chronic kidney disease, unspecified: Secondary | ICD-10-CM

## 2014-11-21 DIAGNOSIS — K56609 Unspecified intestinal obstruction, unspecified as to partial versus complete obstruction: Secondary | ICD-10-CM | POA: Diagnosis present

## 2014-11-21 DIAGNOSIS — D3A098 Benign carcinoid tumors of other sites: Secondary | ICD-10-CM | POA: Diagnosis present

## 2014-11-21 DIAGNOSIS — N390 Urinary tract infection, site not specified: Secondary | ICD-10-CM | POA: Diagnosis present

## 2014-11-21 DIAGNOSIS — C189 Malignant neoplasm of colon, unspecified: Secondary | ICD-10-CM

## 2014-11-21 DIAGNOSIS — Z452 Encounter for adjustment and management of vascular access device: Secondary | ICD-10-CM

## 2014-11-21 DIAGNOSIS — Z932 Ileostomy status: Secondary | ICD-10-CM

## 2014-11-21 DIAGNOSIS — R609 Edema, unspecified: Secondary | ICD-10-CM

## 2014-11-21 DIAGNOSIS — N131 Hydronephrosis with ureteral stricture, not elsewhere classified: Secondary | ICD-10-CM | POA: Diagnosis present

## 2014-11-21 DIAGNOSIS — Z9049 Acquired absence of other specified parts of digestive tract: Secondary | ICD-10-CM | POA: Diagnosis present

## 2014-11-21 DIAGNOSIS — Z66 Do not resuscitate: Secondary | ICD-10-CM | POA: Diagnosis present

## 2014-11-21 DIAGNOSIS — C7A019 Malignant carcinoid tumor of the small intestine, unspecified portion: Secondary | ICD-10-CM

## 2014-11-21 DIAGNOSIS — R6889 Other general symptoms and signs: Secondary | ICD-10-CM | POA: Diagnosis not present

## 2014-11-21 DIAGNOSIS — Z431 Encounter for attention to gastrostomy: Secondary | ICD-10-CM

## 2014-11-21 LAB — CBC
HEMATOCRIT: 29 % — AB (ref 36.0–46.0)
Hemoglobin: 9.1 g/dL — ABNORMAL LOW (ref 12.0–15.0)
MCH: 30.6 pg (ref 26.0–34.0)
MCHC: 31.4 g/dL (ref 30.0–36.0)
MCV: 97.6 fL (ref 78.0–100.0)
Platelets: 255 10*3/uL (ref 150–400)
RBC: 2.97 MIL/uL — ABNORMAL LOW (ref 3.87–5.11)
RDW: 15.5 % (ref 11.5–15.5)
WBC: 11.3 10*3/uL — ABNORMAL HIGH (ref 4.0–10.5)

## 2014-11-21 LAB — CREATININE, SERUM
Creatinine, Ser: 3.21 mg/dL — ABNORMAL HIGH (ref 0.50–1.10)
GFR calc Af Amer: 16 mL/min — ABNORMAL LOW (ref >90)
GFR, EST NON AFRICAN AMERICAN: 14 mL/min — AB (ref >90)

## 2014-11-21 LAB — COMPREHENSIVE METABOLIC PANEL (CC13)
ALT: 34 U/L (ref 0–55)
ANION GAP: 8 meq/L (ref 3–11)
AST: 42 U/L — ABNORMAL HIGH (ref 5–34)
Albumin: 2 g/dL — ABNORMAL LOW (ref 3.5–5.0)
Alkaline Phosphatase: 299 U/L — ABNORMAL HIGH (ref 40–150)
BUN: 95.4 mg/dL — ABNORMAL HIGH (ref 7.0–26.0)
CHLORIDE: 123 meq/L — AB (ref 98–109)
CO2: 18 meq/L — AB (ref 22–29)
Calcium: 12.2 mg/dL — ABNORMAL HIGH (ref 8.4–10.4)
Creatinine: 3.5 mg/dL (ref 0.6–1.1)
EGFR: 13 mL/min/{1.73_m2} — AB (ref >90)
GLUCOSE: 117 mg/dL (ref 70–140)
Potassium: 5.6 mEq/L — ABNORMAL HIGH (ref 3.5–5.1)
SODIUM: 149 meq/L — AB (ref 136–145)
Total Bilirubin: 0.5 mg/dL (ref 0.20–1.20)
Total Protein: 6.5 g/dL (ref 6.4–8.3)

## 2014-11-21 LAB — CBC WITH DIFFERENTIAL/PLATELET
BASO%: 0.2 % (ref 0.0–2.0)
Basophils Absolute: 0 10*3/uL (ref 0.0–0.1)
EOS ABS: 0 10*3/uL (ref 0.0–0.5)
EOS%: 0.1 % (ref 0.0–7.0)
HCT: 33.1 % — ABNORMAL LOW (ref 34.8–46.6)
HGB: 10.2 g/dL — ABNORMAL LOW (ref 11.6–15.9)
LYMPH%: 5.5 % — ABNORMAL LOW (ref 14.0–49.7)
MCH: 30.4 pg (ref 25.1–34.0)
MCHC: 30.8 g/dL — ABNORMAL LOW (ref 31.5–36.0)
MCV: 98.5 fL (ref 79.5–101.0)
MONO#: 0.7 10*3/uL (ref 0.1–0.9)
MONO%: 5.2 % (ref 0.0–14.0)
NEUT%: 89 % — ABNORMAL HIGH (ref 38.4–76.8)
NEUTROS ABS: 11.3 10*3/uL — AB (ref 1.5–6.5)
PLATELETS: 266 10*3/uL (ref 145–400)
RBC: 3.36 10*6/uL — AB (ref 3.70–5.45)
RDW: 15.4 % — AB (ref 11.2–14.5)
WBC: 12.7 10*3/uL — ABNORMAL HIGH (ref 3.9–10.3)
lymph#: 0.7 10*3/uL — ABNORMAL LOW (ref 0.9–3.3)

## 2014-11-21 MED ORDER — HEPARIN SOD (PORK) LOCK FLUSH 100 UNIT/ML IV SOLN
500.0000 [IU] | Freq: Once | INTRAVENOUS | Status: AC
  Administered 2014-11-21: 500 [IU] via INTRAVENOUS
  Filled 2014-11-21: qty 5

## 2014-11-21 MED ORDER — PROMETHAZINE HCL 25 MG/ML IJ SOLN
12.5000 mg | Freq: Four times a day (QID) | INTRAMUSCULAR | Status: DC | PRN
  Administered 2014-11-22 – 2014-11-23 (×3): 12.5 mg via INTRAVENOUS
  Filled 2014-11-21 (×4): qty 1

## 2014-11-21 MED ORDER — HYDROMORPHONE HCL 1 MG/ML IJ SOLN
0.5000 mg | INTRAMUSCULAR | Status: DC | PRN
  Administered 2014-11-21 – 2014-11-23 (×8): 0.5 mg via INTRAVENOUS
  Filled 2014-11-21 (×9): qty 1

## 2014-11-21 MED ORDER — ACETAMINOPHEN 650 MG RE SUPP: 650.0000 mg | Freq: Four times a day (QID) | RECTAL | Status: DC | PRN

## 2014-11-21 MED ORDER — LORAZEPAM 0.5 MG PO TABS: 0.5000 mg | ORAL_TABLET | Freq: Four times a day (QID) | ORAL | Status: DC | PRN

## 2014-11-21 MED ORDER — SODIUM CHLORIDE 0.9 % IV SOLN
INTRAVENOUS | Status: DC
Start: 2014-11-21 — End: 2014-11-22
  Administered 2014-11-21 – 2014-11-22 (×2): via INTRAVENOUS

## 2014-11-21 MED ORDER — AMLODIPINE BESYLATE 5 MG PO TABS
5.0000 mg | ORAL_TABLET | Freq: Every day | ORAL | Status: DC
  Administered 2014-11-22 – 2014-11-23 (×2): 5 mg
  Filled 2014-11-21 (×3): qty 1

## 2014-11-21 MED ORDER — BIOTENE DRY MOUTH MT LIQD
15.0000 mL | OROMUCOSAL | Status: DC | PRN
  Administered 2014-11-27: 15 mL via OROMUCOSAL
  Filled 2014-11-21: qty 15

## 2014-11-21 MED ORDER — ENOXAPARIN SODIUM 30 MG/0.3ML ~~LOC~~ SOLN
30.0000 mg | SUBCUTANEOUS | Status: DC
  Filled 2014-11-21: qty 0.3

## 2014-11-21 MED ORDER — ACETAMINOPHEN 325 MG PO TABS: 650.0000 mg | ORAL_TABLET | Freq: Four times a day (QID) | ORAL | Status: DC | PRN

## 2014-11-21 MED ORDER — ONDANSETRON HCL 4 MG/2ML IJ SOLN
4.0000 mg | Freq: Four times a day (QID) | INTRAMUSCULAR | Status: DC | PRN
  Administered 2014-11-21 – 2014-11-28 (×9): 4 mg via INTRAVENOUS
  Filled 2014-11-21 (×9): qty 2

## 2014-11-21 MED ORDER — SODIUM CHLORIDE 0.9 % IJ SOLN
10.0000 mL | INTRAMUSCULAR | Status: DC | PRN
  Administered 2014-11-21: 10 mL via INTRAVENOUS
  Filled 2014-11-21: qty 10

## 2014-11-21 MED ORDER — OXYCODONE HCL 20 MG/ML PO CONC: 5.0000 mg | ORAL | Status: DC | PRN

## 2014-11-21 MED ORDER — SODIUM CHLORIDE 0.9 % IV SOLN
90.0000 mg | Freq: Once | INTRAVENOUS | Status: AC
  Administered 2014-11-21: 90 mg via INTRAVENOUS
  Filled 2014-11-21: qty 10

## 2014-11-21 NOTE — Progress Notes (Signed)
PARENTERAL NUTRITION CONSULT NOTE - INITIAL  Pharmacy Consult for TPN Indication:   No Known Allergies  Patient Measurements: Height: 5' 3"  (160 cm) Weight: 119 lb 1.6 oz (54.023 kg) IBW/kg (Calculated) : 52.4   Vital Signs: Temp: 97.9 F (36.6 C) (12/03 1449) Temp Source: Oral (12/03 1449) BP: 167/104 mmHg (12/03 1449) Pulse Rate: 110 (12/03 1449) Intake/Output from previous day:   Intake/Output from this shift: Total I/O In: -  Out: 150 [Urine:150]  Labs:  Recent Labs  11/21/14 1136  WBC 12.7*  HGB 10.2*  HCT 33.1*  PLT 266     Recent Labs  11/21/14 1137  NA 149*  K 5.6*  CO2 18*  GLUCOSE 117  BUN 95.4*  CREATININE 3.5*  CALCIUM 12.2*  PROT 6.5  ALBUMIN 2.0*  AST 42*  ALT 34  ALKPHOS 299*  BILITOT 0.50  Corrected calcium = 13.8 Estimated Creatinine Clearance: 12.7 mL/min (by C-G formula based on Cr of 3.5).   No results for input(s): GLUCAP in the last 72 hours.  Medical History: Past Medical History  Diagnosis Date  . Complication of anesthesia     slow to wake up  . Blood transfusion without reported diagnosis 2009 and 2011  . Hypertension     off all bp meds since Dec 20, 2012  . Transfusion history     last transfused 05-31-14(2 units)  . Chronic kidney disease 12-19-2012    elevated bun and creatinine   . Portacath in place     right chest.  . Gastrostomy tube in place     palliative gastrostomy since 8'14 Mid abdomen "at waist level""gastric residual"  . Hx of nephrostomy     05-31-14 Left flank area to straight drainage continuosly, last changed 07-11-14. Void very little to none.  . Ileostomy in place     4'09 left abdomen  . On total parenteral nutrition (TPN)     at home through portacath daily 2000 x 14 hours. 24/7  . Cancer 02/19/2008    Metastatic carcinoid tumor(appendix area origin)    Medications:  Scheduled:  . [START ON 11/22/2014] amLODipine  5 mg Per Tube Daily  . enoxaparin (LOVENOX) injection  30 mg Subcutaneous  Q24H  . pamidronate  90 mg Intravenous Once   Infusions:  . 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 Gearldine Shown office after she was seen for worsening abdominal pain, nausea with dry heaves.  Orders received to continue TPN with pharmacy dosing assistance.   Significant  events:  12/3:   See details of home TPN formula in note from Carolynn Sayers, RN from [facility].  Pamidronate 90 mg IV x 1 ordered by MD for hypercalcemia.  Orders received from MD to hold TPN for tonight given azotemia and hyperkalemia.   Today:   Glucose - WNL  Electrolytes - Na, K, Ca elevated  Renal - SCr elevated to approx 2x baseline  LFTs -  Alk phos and AST elevated (chronic)  TGs -   pending  Prealbumin - pending  NUTRITIONAL GOALS                                                                                Home TPN regimen provides 55 grams protein/day 1570 KCal/day   Given current azotemia and electrolyte abnormalities, neither patient's home formula nor our formulary TPN (Clinimix) would be suitable for administration tonight.   PLAN                                                                                                             1. Hold TPN for tonight (OK'd by Dr. Rockne Menghini). 2. Have family bring in home TPN formula for possible use once electrolytes and renal function have improved.  Spoke with patient's  daughter - she will arrange this.  3. CMet, Mg, Phos, TG, Prealbumin tomorrow AM. 4. Routine TPN labs Mondays and Thursdays. 5. Follow-up daily.  Clayburn Pert, PharmD, BCPS Pager: [phone] 11/21/2014  5:24 PM       .

## 2014-11-21 NOTE — Plan of Care (Signed)
Problem: Phase I Progression Outcomes Goal: Pain controlled with appropriate interventions Outcome: Progressing Goal: OOB as tolerated unless otherwise ordered Outcome: Progressing Goal: Voiding-avoid urinary catheter unless indicated Outcome: Completed/Met Date Met:  11/21/14 Goal: Hemodynamically stable Outcome: Progressing

## 2014-11-21 NOTE — Telephone Encounter (Signed)
Message from pt's daughter requesting to bring pt in for office visit. Pt will be worked in to [facility]. Per [name] , they can be here around 1115.

## 2014-11-21 NOTE — Telephone Encounter (Signed)
per urgent [facility] sch pt appt-per Lavella Lemons pt aware of time of appt

## 2014-11-21 NOTE — Patient Instructions (Signed)
PICC Home Guide A peripherally inserted central catheter (PICC) is a long, thin, flexible tube that is inserted into a vein in the upper arm. It is a form of intravenous (IV) access. It is considered to be a "central" line because the tip of the PICC ends in a large vein in your chest. This large vein is called the superior vena cava (SVC). The PICC tip ends in the SVC because there is a lot of blood flow in the SVC. This allows medicines and IV fluids to be quickly distributed throughout the body. The PICC is inserted using a sterile technique by a specially trained nurse or physician. After the PICC is inserted, a chest X-ray exam is done to be sure it is in the correct place.  A PICC may be placed for different reasons, such as:  To give medicines and liquid nutrition that can only be given through a central line. Examples are:  Certain antibiotic treatments.  Chemotherapy.  Total parenteral nutrition (TPN).  To take frequent blood samples.  To give IV fluids and blood products.  If there is difficulty placing a peripheral intravenous (PIV) catheter. If taken care of properly, a PICC can remain in place for several months. A PICC can also allow a person to go home from the hospital early. Medicine and PICC care can be managed at home by a family member or home health care team. WHAT PROBLEMS CAN HAPPEN WHEN I HAVE A PICC? Problems with a PICC can occasionally occur. These may include the following:  A blood clot (thrombus) forming in or at the tip of the PICC. This can cause the PICC to become clogged. A clot-dissolving medicine called tissue plasminogen activator (tPA) can be given through the PICC to help break up the clot.  Inflammation of the vein (phlebitis) in which the PICC is placed. Signs of inflammation may include redness, pain at the insertion site, red streaks, or being able to feel a "cord" in the vein where the PICC is located.  Infection in the PICC or at the insertion  site. Signs of infection may include fever, chills, redness, swelling, or pus drainage from the PICC insertion site.  PICC movement (malposition). The PICC tip may move from its original position due to excessive physical activity, forceful coughing, sneezing, or vomiting.  A break or cut in the PICC. It is important to not use scissors near the PICC.  Nerve or tendon irritation or injury during PICC insertion. WHAT SHOULD I KEEP IN MIND ABOUT ACTIVITIES WHEN I HAVE A PICC?  You may bend your arm and move it freely. If your PICC is near or at the bend of your elbow, avoid activity with repeated motion at the elbow.  Rest at home for the remainder of the day following PICC line insertion.  Avoid lifting heavy objects as instructed by your health care provider.  Avoid using a crutch with the arm on the same side as your PICC. You may need to use a walker. WHAT SHOULD I KNOW ABOUT MY PICC DRESSING?  Keep your PICC bandage (dressing) clean and dry to prevent infection.  Ask your health care provider when you may shower. Ask your health care provider to teach you how to wrap the PICC when you do take a shower.  Change the PICC dressing as instructed by your health care provider.  Change your PICC dressing if it becomes loose or wet. WHAT SHOULD I KNOW ABOUT PICC CARE?  Check the PICC insertion site   daily for leakage, redness, swelling, or pain.  Do not take a bath, swim, or use hot tubs when you have a PICC. Cover PICC line with clear plastic wrap and tape to keep it dry while showering.  Flush the PICC as directed by your health care provider. Let your health care provider know right away if the PICC is difficult to flush or does not flush. Do not use force to flush the PICC.  Do not use a syringe that is less than 10 mL to flush the PICC.  Never pull or tug on the PICC.  Avoid blood pressure checks on the arm with the PICC.  Keep your PICC identification card with you at all  times.  Do not take the PICC out yourself. Only a trained clinical professional should remove the PICC. SEEK IMMEDIATE MEDICAL CARE IF:  Your PICC is accidentally pulled all the way out. If this happens, cover the insertion site with a bandage or gauze dressing. Do not throw the PICC away. Your health care provider will need to inspect it.  Your PICC was tugged or pulled and has partially come out. Do not  push the PICC back in.  There is any type of drainage, redness, or swelling where the PICC enters the skin.  You cannot flush the PICC, it is difficult to flush, or the PICC leaks around the insertion site when it is flushed.  You hear a "flushing" sound when the PICC is flushed.  You have pain, discomfort, or numbness in your arm, shoulder, or jaw on the same side as the PICC.  You feel your heart "racing" or skipping beats.  You notice a hole or tear in the PICC.  You develop chills or a fever. MAKE SURE YOU:   Understand these instructions.  Will watch your condition.  Will get help right away if you are not doing well or get worse. Document Released: 06/12/2003 Document Revised: 04/22/2014 Document Reviewed: 08/13/2013 ExitCare Patient Information 2015 [employer]. This information is not intended to replace advice given to you by your health care provider. Make sure you discuss any questions you have with your health care provider.  

## 2014-11-21 NOTE — Progress Notes (Signed)
[facility]  Patient Status: Active pt with [facility] prior to this readmission  [facility] is providing the following services: HHRN and Home infusion Pharmacy for home TNA.  See home formula below:  Patient receive TPN with lipids 2 days/week and NON Lipid TPN 5 days/week.    If patient discharges after hours, please call [phone].   Larry Sierras 11/21/2014, 3:11 PM

## 2014-11-21 NOTE — H&P (Signed)
History and Physical:    DEETYA DROUILLARD Dennis:[id number] DOB: 1946/01/13 DOA: 11/21/2014  Referring physician: Dr. Julieanne Manson PCP: Irven Shelling, MD   Chief Complaint: Increased abdominal pain, dry heaves  History of Present Illness:   Lori Dennis is an 68 y.o. female with a PMH of metastatic carcinoid with peritoneal metastasis, bilateral hydronephrosis status post bilateral metal stents, chronic intestinal obstruction status post venting PEG and ileostomy on TNA since 01/2013, CKD with baseline creatinine around 1.8, who was referred as a direct admission from Dr. Gearldine Dennis office after she was seen for worsening abdominal pain, nausea with dry heaves.  The patient has attempted to treat pain with oxycodone without significant relief.  The patient's daughters are at the bedside and are providing much of the history as the patient is currently lethargic.  The patient's daughters noticed she has had worsening left leg swelling over the past 24 hours.    ROS:   Constitutional: No fever, no chills;  Appetite diminished; + weight loss, no weight gain, + fatigue.  HEENT: No blurry vision, no diplopia, no pharyngitis, no dysphagia, +dry mouth, +thrush CV: No chest pain, no palpitations, no PND, no orthopnea, no edema.  Resp: No SOB, no cough, no pleuritic pain. GI: + nausea, no vomiting, no diarrhea, no melena, no hematochezia, no constipation, + abdominal pain.  GU: + mild dysuria, no hematuria, no frequency, no urgency. MSK: no myalgias, no arthralgias.  Neuro:  No headache, no focal neurological deficits, no history of seizures.  Psych: + depression, + anxiety.  Endo: No heat intolerance, + cold intolerance, no polyuria, no polydipsia  Skin: No rashes, no skin lesions.  Heme: + easy bruising.  Travel history: No recent travel.   Past Medical History:   Past Medical History  Diagnosis Date  . Complication of anesthesia     slow to wake up  . Blood transfusion without reported  diagnosis 2009 and 2011  . Hypertension     off all bp meds since Dec 20, 2012  . Transfusion history     last transfused 05-31-14(2 units)  . Chronic kidney disease 12-19-2012    elevated bun and creatinine   . Portacath in place     right chest.  . Gastrostomy tube in place     palliative gastrostomy since 8'14 Mid abdomen "at waist level""gastric residual"  . Hx of nephrostomy     05-31-14 Left flank area to straight drainage continuosly, last changed 07-11-14. Void very little to none.  . Ileostomy in place     4'09 left abdomen  . On total parenteral nutrition (TPN)     at home through portacath daily 2000 x 14 hours. 24/7  . Cancer 02/19/2008    Metastatic carcinoid tumor(appendix area origin)    Past Surgical History:   Past Surgical History  Procedure Laterality Date  . Bowel resection  06/08/2010    ileocolonic resection, enteroenterostomy  . Left colectomy  04/02/2008    Exploratory laparotomy, biopsy of omental nodule, sigmoid colectomy  . Vaginal hysterectomy      at age 45  . Cholecystectomy      age 7  . Portacath placement Right 02/06/2013    Procedure: Ultrasound guided PORT-A-CATH INSERTION with flouro;  Surgeon: [name], MD;  Location: [facility];  Service: General;  Laterality: Right;  . Esophagogastroduodenoscopy N/A 06/28/2013    Procedure: ESOPHAGOGASTRODUODENOSCOPY (EGD);  Surgeon: Winfield Cunas., MD;  Location: [facility];  Service: Endoscopy;  Laterality: N/A;  . Cystoscopy with retrograde pyelogram, ureteroscopy and stent placement Bilateral 04/08/2014    Procedure: CYSTOSCOPY WITH BILATERAL  RETROGRADE PYELOGRAM,  AND  BILATERAL  STENT PLACEMENT;  Surgeon: Irine Seal, MD;  Location: [facility];  Service: Urology;  Laterality: Bilateral;  . Cystoscopy with retrograde pyelogram, ureteroscopy and stent placement Bilateral 05/03/2014    Procedure: CYSTOSCOPY WITH BILATERAL DOUBLE J STENT EXCHANGE, LEFT URETEROSCOPY;  Surgeon: Bernestine Amass, MD;  Location:  [facility];  Service: Urology;  Laterality: Bilateral;  . Cystoscopy w/ ureteral stent placement Bilateral 08/06/2014    Procedure: CYSTOSCOPY WITH RETROGRADE PYELOGRAM BILATERAL STENT EXCHANGE;  Surgeon: Malka So, MD;  Location: [facility];  Service: Urology;  Laterality: Bilateral;  . Peripherally inserted central catheter insertion  10-10-14  . Port-a-cath removal N/A 10/14/2014    Procedure: REMOVAL PORT-A-CATH;  Surgeon: Jackolyn Confer, MD;  Location: [facility];  Service: General;  Laterality: N/A;    Social History:   History   Social History  . Marital Status: Divorced    Spouse Name: N/A    Number of Children: 3  . Years of Education: N/A   Occupational History  . Retired Pharmacist, hospital    Social History Main Topics  . Smoking status: Never Smoker   . Smokeless tobacco: Never Used  . Alcohol Use: No  . Drug Use: No  . Sexual Activity: No   Other Topics Concern  . Not on file   Social History Narrative   Divorced.  Lives alone.  Ambulates independently at baseline.    Family history:   Family History  Problem Relation Age of Onset  . Heart disease Mother   . Cancer Sister     ovarian    Allergies   Review of patient's allergies indicates no known allergies.  Current Medications:   Prior to Admission medications   Medication Sig Start Date End Date Taking? Authorizing Provider  amLODipine (NORVASC) 5 MG tablet Place 5 mg into feeding tube every morning.    Yes Historical Provider, MD  antiseptic oral rinse (BIOTENE) LIQD 15 mLs by Mouth Rinse route as needed (for dry mouth).   Yes Historical Provider, MD  fat emulsion 20 % infusion Inject 100 mLs into the vein 2 (two) times a week. Doesn't have set days   Yes Historical Provider, MD  LORazepam (ATIVAN) 0.5 MG tablet Place 1 tablet (0.5 mg total) under the tongue every 6 (six) hours as needed for anxiety. Take 1/2 tablet = 0.25mg  11/06/14  Yes Ladell Pier, MD  ondansetron [facility]) 4 MG/2ML SOLN injection Inject 4 mg  into the vein every 6 (six) hours as needed for nausea or vomiting.   Yes Historical Provider, MD  oxyCODONE (ROXICODONE INTENSOL) 20 MG/ML concentrated solution Take 0.5 mLs (10 mg total) by mouth every 4 (four) hours as needed for moderate pain or severe pain. Patient taking differently: Place 5-10 mg into feeding tube every 4 (four) hours as needed for moderate pain or severe pain.  09/05/14  Yes Ladell Pier, MD  prochlorperazine (COMPAZINE) 5 MG tablet Take 1 tablet (5 mg total) by mouth every 6 (six) hours as needed for nausea or vomiting. Patient taking differently: Place 5 mg into feeding tube every 6 (six) hours as needed for nausea or vomiting.  11/06/14  Yes Ladell Pier, MD  promethazine (PHENERGAN) 25 MG/ML injection Inject 12.5 mg into the vein every 6 (six) hours as needed for nausea or vomiting.   Yes Historical Provider,  MD  TPN ADULT Inject 2,000 mLs into the vein continuous. 14 hours/day start between 2000-2100 Five days a week With MVI   Yes Historical Provider, MD  traMADol (ULTRAM) 50 MG tablet Place 50 mg into feeding tube every 6 (six) hours as needed for moderate pain.    Yes Historical Provider, MD  fluconazole (DIFLUCAN) 50 MG tablet Take 1 tablet (50 mg total) by mouth daily. X 7 days Patient not taking: Reported on 11/21/2014 10/09/14   Ladell Pier, MD    Physical Exam:   Filed Vitals:   11/21/14 1449  BP: 167/104  Pulse: 110  Temp: 97.9 F (36.6 C)  TempSrc: Oral  Resp: 20  Height: 5\' 3"  (1.6 m)  Weight: 54.023 kg (119 lb 1.6 oz)  SpO2: 100%     Physical Exam: Blood pressure 167/104, pulse 110, temperature 97.9 F (36.6 C), temperature source Oral, resp. rate 20, height 5\' 3"  (1.6 m), weight 54.023 kg (119 lb 1.6 oz), SpO2 100 %. Gen: Lethargic. Head: Normocephalic, atraumatic. Eyes: PERRL, EOMI, sclerae nonicteric. Mouth: Oropharynx with thick coating on the tongue. Neck: Supple, no thyromegaly, no lymphadenopathy, no jugular venous  distention. Chest: Lungs are diminished but clear. CV: Heart sounds are tachycardic, regular, with no murmurs, rubs, or gallops. Abdomen: Soft, firm and tender in the lower quadrants. Ileostomy and venting PEG tube in place. Extremities: Extremities show significant asymmetric edema with the left being much more swollen. Skin: Warm and dry. Neuro: Lethargic; cranial nerves II through XII grossly intact. Psych: Mood and affect depressed/flat.   Data Review:    Labs: Basic Metabolic Panel:  Recent Labs Lab 11/21/14 1137  NA 149*  K 5.6*  CO2 18*  GLUCOSE 117  BUN 95.4*  CREATININE 3.5*  CALCIUM 12.2*   Liver Function Tests:  Recent Labs Lab 11/21/14 1137  AST 42*  ALT 34  ALKPHOS 299*  BILITOT 0.50  PROT 6.5  ALBUMIN 2.0*   CBC:  Recent Labs Lab 11/21/14 1136  WBC 12.7*  NEUTROABS 11.3*  HGB 10.2*  HCT 33.1*  MCV 98.5  PLT 266    Radiographic Studies: Dg Abd Portable 2v  11/21/2014   CLINICAL DATA:  Small bowel obstruction.  Abdominal pain, nausea.  EXAM: PORTABLE ABDOMEN - 2 VIEW  COMPARISON:  05/30/2014  FINDINGS: Bilateral ureteral stents are in place, stable. Gastrostomy tube projects over the distal stomach. Nonobstructive bowel gas pattern. No free air organomegaly. Visualized lung bases are clear. No acute bony abnormality.  IMPRESSION: Bilateral ureteral stents in stable position. No evidence of bowel obstruction or free air.   Electronically Signed   By: Rolm Baptise M.D.   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  IV anti-emetics as needed.  KUB ordered, no frank bowel obstruction noted.  Treat pain with IV Dilaudid.  Active Problems:   Carcinoid tumor of intestine  Dr. Benay Spice aware of the patient's admission.    Dehydration /  Hypernatremia  Hydrate and monitor electrolytes.    Anemia of chronic disease  Hemoglobin stable with no current indication for transfusion.    Acute-on-chronic kidney injury  History of bilateral obstructive nephropathy secondary to ureteral obstruction, status post ureteral stents.  Hydrate and monitor for renal recovery. Consider urology consultation if no improvement in renal function.    Hypercalcemia  We'll give a dose of pamidronate.    Hyperkalemia  Mild, hydrate and monitor.    DVT prophylaxis  Lovenox ordered.  Code Status: Full. Family Communication: Daughter Leone Brand [phone], Wynetta Emery [phone] Disposition Plan: Home when stable.  Time spent: 70 minutes.  Tiannah Greenly [facility] Pager [phone] Cell: [phone]   If 7PM-7AM, please contact night-coverage [email or web address] Password [email or web address] 11/21/2014, 5:10 PM

## 2014-11-21 NOTE — Progress Notes (Signed)
PARENTERAL NUTRITION CONSULT NOTE - INITIAL  Pharmacy Consult for TPN Indication:   No Known Allergies  Patient Measurements: Height: 5' 3"  (160 cm) Weight: 119 lb 1.6 oz (54.023 kg) IBW/kg (Calculated) : 52.4   Vital Signs: Temp: 97.9 F (36.6 C) (12/03 1449) Temp Source: Oral (12/03 1449) BP: 167/104 mmHg (12/03 1449) Pulse Rate: 110 (12/03 1449) Intake/Output from previous day:   Intake/Output from this shift: Total I/O In: -  Out: 150 [Urine:150]  Labs:  Recent Labs  11/21/14 1136 11/21/14 1539  WBC 12.7* 11.3*  HGB 10.2* 9.1*  HCT 33.1* 29.0*  PLT 266 255     Recent Labs  11/21/14 1137  NA 149*  K 5.6*  CO2 18*  GLUCOSE 117  BUN 95.4*  CREATININE 3.5*  CALCIUM 12.2*  PROT 6.5  ALBUMIN 2.0*  AST 42*  ALT 34  ALKPHOS 299*  BILITOT 0.50  Corrected calcium = 13.8 Estimated Creatinine Clearance: 12.7 mL/min (by C-G formula based on Cr of 3.5).   No results for input(s): GLUCAP in the last 72 hours.  Medical History: Past Medical History  Diagnosis Date  . Complication of anesthesia     slow to wake up  . Blood transfusion without reported diagnosis 2009 and 2011  . Hypertension     off all bp meds since Dec 20, 2012  . Transfusion history     last transfused 05-31-14(2 units)  . Chronic kidney disease 12-19-2012    elevated bun and creatinine   . Portacath in place     right chest.  . Gastrostomy tube in place     palliative gastrostomy since 8'14 Mid abdomen "at waist level""gastric residual"  . Hx of nephrostomy     05-31-14 Left flank area to straight drainage continuosly, last changed 07-11-14. Void very little to none.  . Ileostomy in place     4'09 left abdomen  . On total parenteral nutrition (TPN)     at home through portacath daily 2000 x 14 hours. 24/7  . Cancer 02/19/2008    Metastatic carcinoid tumor(appendix area origin)    Medications:  Scheduled:  . [START ON 11/22/2014] amLODipine  5 mg Per Tube Daily  . enoxaparin  (LOVENOX) injection  30 mg Subcutaneous Q24H  . pamidronate  90 mg Intravenous Once   Infusions:  . sodium chloride 100 mL/hr at 11/21/14 1713   PRN: acetaminophen **OR** acetaminophen, antiseptic oral rinse, HYDROmorphone (DILAUDID) injection, LORazepam, ondansetron, oxyCODONE, promethazine   Insulin Requirements:  None at present   Current Nutrition:  Clear liquids   IVF: NS 100 mL/hr  Central access:  PICC TPN start date:   On chronic home TPN  ASSESSMENT                                                                                             HPI:  68 y/o F with chronic intestinal obstruction secondary to metastatic carcinoid tumor, has venting PEG and ileostomy, on home TPN since 01/2013, CKD with baseline SCr approximately 1.8, admitted from Dr. Gearldine Shown office after she was seen for worsening abdominal pain, nausea with dry heaves.  Orders received to continue TPN with pharmacy dosing assistance.   Significant events:  12/3:   See details of home TPN formula in note from Carolynn Sayers, RN from [place].  Pamidronate 90 mg IV x 1 ordered by MD for hypercalcemia.  Orders received from MD to hold TPN for tonight given azotemia and electrolyte abnormalities.   Today:   Glucose - WNL  Electrolytes - Na, K, Ca elevated  Renal - SCr elevated to approx 2x baseline  LFTs -  Alk phos and AST elevated (chronic)  TGs -   pending  Prealbumin - pending  NUTRITIONAL GOALS                                                                                Home TPN regimen provides:  55 grams/protein/day and 1070 KCal/day on days without lipids (5 days/week)  55 grams protein/day and 1570 KCal/day on days with lipids (2 days/week)    Given current azotemia and electrolyte abnormalities, neither patient's home formula nor our formulary TPN ([employer]) would be suitable for administration tonight.   PLAN                                                                                                              1. Hold TPN for tonight (OK'd by Dr. Rockne Menghini). 2. Have family bring in home TPN formula for possible use once electrolytes and renal function have improved.  Spoke with patient's daughter - she will arrange this.  3. CMet, Mg, Phos, TG, Prealbumin tomorrow AM. 4. Routine TPN labs Mondays and Thursdays. 5. Follow-up daily.  Clayburn Pert, PharmD, BCPS Pager: [phone] 11/21/2014  5:34 PM       .

## 2014-11-22 ENCOUNTER — Encounter: Payer: Self-pay | Admitting: Nurse Practitioner

## 2014-11-22 ENCOUNTER — Inpatient Hospital Stay (HOSPITAL_COMMUNITY): Payer: Medicare Other

## 2014-11-22 DIAGNOSIS — E162 Hypoglycemia, unspecified: Secondary | ICD-10-CM | POA: Diagnosis not present

## 2014-11-22 DIAGNOSIS — K5660 Unspecified intestinal obstruction: Secondary | ICD-10-CM

## 2014-11-22 DIAGNOSIS — R600 Localized edema: Secondary | ICD-10-CM | POA: Diagnosis present

## 2014-11-22 DIAGNOSIS — R609 Edema, unspecified: Secondary | ICD-10-CM

## 2014-11-22 DIAGNOSIS — R4182 Altered mental status, unspecified: Secondary | ICD-10-CM

## 2014-11-22 DIAGNOSIS — R6889 Other general symptoms and signs: Secondary | ICD-10-CM

## 2014-11-22 DIAGNOSIS — N19 Unspecified kidney failure: Secondary | ICD-10-CM

## 2014-11-22 DIAGNOSIS — E875 Hyperkalemia: Secondary | ICD-10-CM

## 2014-11-22 DIAGNOSIS — C7A019 Malignant carcinoid tumor of the small intestine, unspecified portion: Secondary | ICD-10-CM

## 2014-11-22 DIAGNOSIS — I10 Essential (primary) hypertension: Secondary | ICD-10-CM

## 2014-11-22 LAB — TRIGLYCERIDES: Triglycerides: 105 mg/dL (ref <150)

## 2014-11-22 LAB — BASIC METABOLIC PANEL
ANION GAP: 12 (ref 5–15)
BUN: 96 mg/dL — ABNORMAL HIGH (ref 6–23)
CHLORIDE: 117 meq/L — AB (ref 96–112)
CO2: 16 meq/L — AB (ref 19–32)
Calcium: 10.4 mg/dL (ref 8.4–10.5)
Creatinine, Ser: 3.92 mg/dL — ABNORMAL HIGH (ref 0.50–1.10)
GFR calc non Af Amer: 11 mL/min — ABNORMAL LOW (ref >90)
GFR, EST AFRICAN AMERICAN: 13 mL/min — AB (ref >90)
Glucose, Bld: 35 mg/dL — CL (ref 70–99)
POTASSIUM: 5.2 meq/L (ref 3.7–5.3)
Sodium: 145 mEq/L (ref 137–147)

## 2014-11-22 LAB — CBC
HCT: 29.6 % — ABNORMAL LOW (ref 36.0–46.0)
Hemoglobin: 9.2 g/dL — ABNORMAL LOW (ref 12.0–15.0)
MCH: 30.5 pg (ref 26.0–34.0)
MCHC: 31.1 g/dL (ref 30.0–36.0)
MCV: 98 fL (ref 78.0–100.0)
PLATELETS: 258 10*3/uL (ref 150–400)
RBC: 3.02 MIL/uL — ABNORMAL LOW (ref 3.87–5.11)
RDW: 15.9 % — AB (ref 11.5–15.5)
WBC: 10.9 10*3/uL — ABNORMAL HIGH (ref 4.0–10.5)

## 2014-11-22 LAB — DIFFERENTIAL
BASOS ABS: 0 10*3/uL (ref 0.0–0.1)
Basophils Relative: 0 % (ref 0–1)
EOS ABS: 0 10*3/uL (ref 0.0–0.7)
EOS PCT: 0 % (ref 0–5)
LYMPHS PCT: 10 % — AB (ref 12–46)
Lymphs Abs: 1.1 10*3/uL (ref 0.7–4.0)
Monocytes Absolute: 0.8 10*3/uL (ref 0.1–1.0)
Monocytes Relative: 7 % (ref 3–12)
NEUTROS PCT: 83 % — AB (ref 43–77)
Neutro Abs: 9 10*3/uL — ABNORMAL HIGH (ref 1.7–7.7)

## 2014-11-22 LAB — COMPREHENSIVE METABOLIC PANEL
ALBUMIN: 1.9 g/dL — AB (ref 3.5–5.2)
ALT: 34 U/L (ref 0–35)
AST: 56 U/L — ABNORMAL HIGH (ref 0–37)
Alkaline Phosphatase: 277 U/L — ABNORMAL HIGH (ref 39–117)
Anion gap: 13 (ref 5–15)
BUN: 100 mg/dL — ABNORMAL HIGH (ref 6–23)
CALCIUM: 11.1 mg/dL — AB (ref 8.4–10.5)
CO2: 18 mEq/L — ABNORMAL LOW (ref 19–32)
CREATININE: 4 mg/dL — AB (ref 0.50–1.10)
Chloride: 120 mEq/L — ABNORMAL HIGH (ref 96–112)
GFR calc Af Amer: 12 mL/min — ABNORMAL LOW (ref >90)
GFR calc non Af Amer: 11 mL/min — ABNORMAL LOW (ref >90)
Glucose, Bld: 63 mg/dL — ABNORMAL LOW (ref 70–99)
Potassium: 6.4 mEq/L — ABNORMAL HIGH (ref 3.7–5.3)
Sodium: 151 mEq/L — ABNORMAL HIGH (ref 137–147)
Total Bilirubin: 0.4 mg/dL (ref 0.3–1.2)
Total Protein: 6.3 g/dL (ref 6.0–8.3)

## 2014-11-22 LAB — SODIUM, URINE, RANDOM: SODIUM UR: 32 meq/L

## 2014-11-22 LAB — URINALYSIS, ROUTINE W REFLEX MICROSCOPIC
Bilirubin Urine: NEGATIVE
Glucose, UA: NEGATIVE mg/dL
Ketones, ur: NEGATIVE mg/dL
NITRITE: POSITIVE — AB
PROTEIN: 30 mg/dL — AB
SPECIFIC GRAVITY, URINE: 1.011 (ref 1.005–1.030)
UROBILINOGEN UA: 0.2 mg/dL (ref 0.0–1.0)
pH: 5.5 (ref 5.0–8.0)

## 2014-11-22 LAB — URINE MICROSCOPIC-ADD ON

## 2014-11-22 LAB — CREATININE, URINE, RANDOM: CREATININE, URINE: 74.91 mg/dL

## 2014-11-22 LAB — GLUCOSE, CAPILLARY
GLUCOSE-CAPILLARY: 46 mg/dL — AB (ref 70–99)
GLUCOSE-CAPILLARY: 104 mg/dL — AB (ref 70–99)
GLUCOSE-CAPILLARY: 115 mg/dL — AB (ref 70–99)
Glucose-Capillary: 56 mg/dL — ABNORMAL LOW (ref 70–99)
Glucose-Capillary: 126 mg/dL — ABNORMAL HIGH (ref 70–99)

## 2014-11-22 LAB — MAGNESIUM: MAGNESIUM: 2.2 mg/dL (ref 1.5–2.5)

## 2014-11-22 LAB — PHOSPHORUS: Phosphorus: 4.5 mg/dL (ref 2.3–4.6)

## 2014-11-22 LAB — PREALBUMIN: PREALBUMIN: 15.9 mg/dL — AB (ref 17.0–34.0)

## 2014-11-22 MED ORDER — SODIUM POLYSTYRENE SULFONATE 15 GM/60ML PO SUSP
30.0000 g | Freq: Once | ORAL | Status: AC
Start: 2014-11-22 — End: 2014-11-22
  Administered 2014-11-22: 30 g
  Filled 2014-11-22: qty 120

## 2014-11-22 MED ORDER — INSULIN ASPART 100 UNIT/ML IV SOLN
10.0000 [IU] | Freq: Once | INTRAVENOUS | Status: AC
  Administered 2014-11-22: 10 [IU] via INTRAVENOUS

## 2014-11-22 MED ORDER — BOOST / RESOURCE BREEZE PO LIQD: 1.0000 | Freq: Two times a day (BID) | ORAL | Status: DC | PRN

## 2014-11-22 MED ORDER — TRACE MINERALS CR-CU-F-FE-I-MN-MO-SE-ZN IV SOLN
INTRAVENOUS | Status: AC
  Administered 2014-11-22: 17:00:00 via INTRAVENOUS
  Filled 2014-11-22: qty 1000

## 2014-11-22 MED ORDER — INSULIN REGULAR BOLUS VIA INFUSION: 10.0000 [IU] | Freq: Once | INTRAVENOUS | Status: DC

## 2014-11-22 MED ORDER — CEFTRIAXONE SODIUM IN DEXTROSE 20 MG/ML IV SOLN
1.0000 g | INTRAVENOUS | Status: DC
  Administered 2014-11-22 – 2014-11-23 (×2): 1 g via INTRAVENOUS
  Filled 2014-11-22 (×3): qty 50

## 2014-11-22 MED ORDER — DEXTROSE-NACL 5-0.45 % IV SOLN
INTRAVENOUS | Status: DC
  Administered 2014-11-23 (×2): via INTRAVENOUS

## 2014-11-22 MED ORDER — INSULIN REGULAR HUMAN 100 UNIT/ML IJ SOLN
10.0000 [IU] | Freq: Once | INTRAMUSCULAR | Status: DC
  Filled 2014-11-22: qty 0.1

## 2014-11-22 MED ORDER — INSULIN ASPART 100 UNIT/ML ~~LOC~~ SOLN
0.0000 [IU] | Freq: Four times a day (QID) | SUBCUTANEOUS | Status: DC
  Administered 2014-11-23 (×4): 1 [IU] via SUBCUTANEOUS
  Administered 2014-11-23 – 2014-11-24 (×2): 2 [IU] via SUBCUTANEOUS

## 2014-11-22 MED ORDER — DEXTROSE-NACL 5-0.45 % IV SOLN
INTRAVENOUS | Status: AC
  Administered 2014-11-22: 600 mL via INTRAVENOUS

## 2014-11-22 MED ORDER — DEXTROSE 50 % IV SOLN
INTRAVENOUS | Status: AC
  Filled 2014-11-22: qty 50

## 2014-11-22 MED ORDER — SODIUM CHLORIDE 0.45 % IV SOLN
INTRAVENOUS | Status: DC
  Administered 2014-11-22: 1000 mL via INTRAVENOUS

## 2014-11-22 MED ORDER — DEXTROSE 50 % IV SOLN
1.0000 | Freq: Once | INTRAVENOUS | Status: AC
  Administered 2014-11-22: 50 mL via INTRAVENOUS
  Filled 2014-11-22: qty 50

## 2014-11-22 NOTE — Assessment & Plan Note (Signed)
Potassium has increased to 5.6.  Confirmed the patient is not taking any potassium tablets at home.  Most likely, this is secondary to acute renal insufficiency and probable dehydration.

## 2014-11-22 NOTE — Consult Note (Signed)
Urology Consult  Referring physician: Rama, C Reason for referral: Hydronephrosis  Chief Complaint: Hydronephrosis and renal insufficiency  History of Present Illness: Lower abdominal pain and nausea; elevated serum Cr; well known to Dr Jeffie Pollock with metastatic carcinoid and multiple GI surgeries and bilateral hydro and previous perc tube; metal COOK stents inserted in August 2015 by Dr Jeffie Pollock; last seen October with LUTS and treated with Myrbetriq and serum Cr 2.65; was also having rt pelvic pain then; was no longer on macrodantin due to GI upset and renal disease;  Admitted with nausea and elevated Cr; confused; family aware of poor prognosis and not a candidate for dialysis; hospice considered U/sound: mild rt hydro and moderated on the left and stents noted; foley in place; she had bilateral hydro on CT scan in June that was persistent Serum Cr 3.92 and fairly consistent over 24 hours Modifying factors: There are no other modifying factors  Associated signs and symptoms: There are no other associated signs and symptoms Aggravating and relieving factors: There are no other aggravating or relieving factors Severity: Moderate Duration: Persistent    Past Medical History  Diagnosis Date  . Complication of anesthesia     slow to wake up  . Blood transfusion without reported diagnosis 2009 and 2011  . Hypertension     off all bp meds since Dec 20, 2012  . Transfusion history     last transfused 05-31-14(2 units)  . Chronic kidney disease 12-19-2012    elevated bun and creatinine   . Portacath in place     right chest.  . Gastrostomy tube in place     palliative gastrostomy since 8'14 Mid abdomen "at waist level""gastric residual"  . Hx of nephrostomy     05-31-14 Left flank area to straight drainage continuosly, last changed 07-11-14. Void very little to none.  . Ileostomy in place     4'09 left abdomen  . On total parenteral nutrition (TPN)     at home through portacath daily 2000 x 14  hours. 24/7  . Cancer 02/19/2008    Metastatic carcinoid tumor(appendix area origin)   Past Surgical History  Procedure Laterality Date  . Bowel resection  06/08/2010    ileocolonic resection, enteroenterostomy  . Left colectomy  04/02/2008    Exploratory laparotomy, biopsy of omental nodule, sigmoid colectomy  . Vaginal hysterectomy      at age 79  . Cholecystectomy      age 89  . Portacath placement Right 02/06/2013    Procedure: Ultrasound guided PORT-A-CATH INSERTION with flouro;  Surgeon: [name], MD;  Location: [facility];  Service: General;  Laterality: Right;  . Esophagogastroduodenoscopy N/A 06/28/2013    Procedure: ESOPHAGOGASTRODUODENOSCOPY (EGD);  Surgeon: Winfield Cunas., MD;  Location: [facility];  Service: Endoscopy;  Laterality: N/A;  . Cystoscopy with retrograde pyelogram, ureteroscopy and stent placement Bilateral 04/08/2014    Procedure: CYSTOSCOPY WITH BILATERAL  RETROGRADE PYELOGRAM,  AND  BILATERAL  STENT PLACEMENT;  Surgeon: Irine Seal, MD;  Location: [facility];  Service: Urology;  Laterality: Bilateral;  . Cystoscopy with retrograde pyelogram, ureteroscopy and stent placement Bilateral 05/03/2014    Procedure: CYSTOSCOPY WITH BILATERAL DOUBLE J STENT EXCHANGE, LEFT URETEROSCOPY;  Surgeon: Bernestine Amass, MD;  Location: [facility];  Service: Urology;  Laterality: Bilateral;  . Cystoscopy w/ ureteral stent placement Bilateral 08/06/2014    Procedure: CYSTOSCOPY WITH RETROGRADE PYELOGRAM BILATERAL STENT EXCHANGE;  Surgeon: Malka So, MD;  Location: [facility];  Service: Urology;  Laterality: Bilateral;  . Peripherally inserted central catheter insertion  10-10-14  . Port-a-cath removal N/A 10/14/2014    Procedure: REMOVAL PORT-A-CATH;  Surgeon: Jackolyn Confer, MD;  Location: [facility];  Service: General;  Laterality: N/A;    Medications: I have reviewed the patient's current medications. Allergies: No Known Allergies  Family History  Problem Relation Age of Onset  .  Heart disease Mother   . Cancer Sister     ovarian   Social History:  reports that she has never smoked. She has never used smokeless tobacco. She reports that she does not drink alcohol or use illicit drugs.  ROS: All systems are reviewed and negative except as noted. Rest negative  Physical Exam:  Vital signs in last 24 hours: Temp:  [97.3 F (36.3 C)-97.4 F (36.3 C)] 97.3 F (36.3 C) (12/04 0503) Pulse Rate:  [94-105] 94 (12/04 0503) Resp:  [18] 18 (12/04 0503) BP: (138-155)/(76-86) 138/76 mmHg (12/04 1213) SpO2:  [99 %-100 %] 99 % (12/04 0503)  Cardiovascular: Skin warm; not flushed Respiratory: Breaths quiet; no shortness of breath Abdomen: No masses Neurological: Normal sensation to touch Musculoskeletal: Normal motor function arms and legs Lymphatics: No inguinal adenopathy Skin: No rashes Genitourinary:very thin and cachectic  Laboratory Data:  Results for orders placed or performed during the hospital encounter of 11/21/14 (from the past 72 hour(s))  CBC     Status: Abnormal   Collection Time: 11/21/14  3:39 PM  Result Value Ref Range   WBC 11.3 (H) 4.0 - 10.5 K/uL   RBC 2.97 (L) 3.87 - 5.11 MIL/uL   Hemoglobin 9.1 (L) 12.0 - 15.0 g/dL   HCT 29.0 (L) 36.0 - 46.0 %   MCV 97.6 78.0 - 100.0 fL   MCH 30.6 26.0 - 34.0 pg   MCHC 31.4 30.0 - 36.0 g/dL   RDW 15.5 11.5 - 15.5 %   Platelets 255 150 - 400 K/uL  Creatinine, serum     Status: Abnormal   Collection Time: 11/21/14  3:39 PM  Result Value Ref Range   Creatinine, Ser 3.21 (H) 0.50 - 1.10 mg/dL   GFR calc non Af Amer 14 (L) >90 mL/min   GFR calc Af Amer 16 (L) >90 mL/min    Comment: (NOTE) The eGFR has been calculated using the CKD EPI equation. This calculation has not been validated in all clinical situations. eGFR's persistently <90 mL/min signify possible Chronic Kidney Disease.   Comprehensive metabolic panel     Status: Abnormal   Collection Time: 11/22/14  5:25 AM  Result Value Ref Range    Sodium 151 (H) 137 - 147 mEq/L   Potassium 6.4 (H) 3.7 - 5.3 mEq/L   Chloride 120 (H) 96 - 112 mEq/L   CO2 18 (L) 19 - 32 mEq/L   Glucose, Bld 63 (L) 70 - 99 mg/dL   BUN 100 (H) 6 - 23 mg/dL   Creatinine, Ser 4.00 (H) 0.50 - 1.10 mg/dL   Calcium 11.1 (H) 8.4 - 10.5 mg/dL   Total Protein 6.3 6.0 - 8.3 g/dL   Albumin 1.9 (L) 3.5 - 5.2 g/dL   AST 56 (H) 0 - 37 U/L   ALT 34 0 - 35 U/L   Alkaline Phosphatase 277 (H) 39 - 117 U/L   Total Bilirubin 0.4 0.3 - 1.2 mg/dL   GFR calc non Af Amer 11 (L) >90 mL/min   GFR calc Af Amer 12 (L) >90 mL/min    Comment: (NOTE) The eGFR has been  calculated using the CKD EPI equation. This calculation has not been validated in all clinical situations. eGFR's persistently <90 mL/min signify possible Chronic Kidney Disease.    Anion gap 13 5 - 15  Magnesium     Status: None   Collection Time: 11/22/14  5:25 AM  Result Value Ref Range   Magnesium 2.2 1.5 - 2.5 mg/dL  Phosphorus     Status: None   Collection Time: 11/22/14  5:25 AM  Result Value Ref Range   Phosphorus 4.5 2.3 - 4.6 mg/dL  Triglycerides     Status: None   Collection Time: 11/22/14  5:25 AM  Result Value Ref Range   Triglycerides 105 <150 mg/dL    Comment: Performed at [facility]  CBC     Status: Abnormal   Collection Time: 11/22/14  5:25 AM  Result Value Ref Range   WBC 10.9 (H) 4.0 - 10.5 K/uL   RBC 3.02 (L) 3.87 - 5.11 MIL/uL   Hemoglobin 9.2 (L) 12.0 - 15.0 g/dL   HCT 29.6 (L) 36.0 - 46.0 %   MCV 98.0 78.0 - 100.0 fL   MCH 30.5 26.0 - 34.0 pg   MCHC 31.1 30.0 - 36.0 g/dL   RDW 15.9 (H) 11.5 - 15.5 %   Platelets 258 150 - 400 K/uL  Differential     Status: Abnormal   Collection Time: 11/22/14  5:25 AM  Result Value Ref Range   Neutrophils Relative % 83 (H) 43 - 77 %   Neutro Abs 9.0 (H) 1.7 - 7.7 K/uL   Lymphocytes Relative 10 (L) 12 - 46 %   Lymphs Abs 1.1 0.7 - 4.0 K/uL   Monocytes Relative 7 3 - 12 %   Monocytes Absolute 0.8 0.1 - 1.0 K/uL   Eosinophils  Relative 0 0 - 5 %   Eosinophils Absolute 0.0 0.0 - 0.7 K/uL   Basophils Relative 0 0 - 1 %   Basophils Absolute 0.0 0.0 - 0.1 K/uL  Glucose, capillary     Status: Abnormal   Collection Time: 11/22/14  6:20 AM  Result Value Ref Range   Glucose-Capillary 46 (L) 70 - 99 mg/dL  Glucose, capillary     Status: Abnormal   Collection Time: 11/22/14  6:42 AM  Result Value Ref Range   Glucose-Capillary 104 (H) 70 - 99 mg/dL  Basic metabolic panel     Status: Abnormal   Collection Time: 11/22/14 12:05 PM  Result Value Ref Range   Sodium 145 137 - 147 mEq/L   Potassium 5.2 3.7 - 5.3 mEq/L    Comment: REPEATED TO VERIFY DELTA CHECK NOTED    Chloride 117 (H) 96 - 112 mEq/L   CO2 16 (L) 19 - 32 mEq/L   Glucose, Bld 35 (LL) 70 - 99 mg/dL    Comment: CRITICAL RESULT CALLED TO, READ BACK BY AND VERIFIED WITH: K. MILLER RN AT 4097 ON 12.4.15 BY SHUEA    BUN 96 (H) 6 - 23 mg/dL   Creatinine, Ser 3.92 (H) 0.50 - 1.10 mg/dL   Calcium 10.4 8.4 - 10.5 mg/dL   GFR calc non Af Amer 11 (L) >90 mL/min   GFR calc Af Amer 13 (L) >90 mL/min    Comment: (NOTE) The eGFR has been calculated using the CKD EPI equation. This calculation has not been validated in all clinical situations. eGFR's persistently <90 mL/min signify possible Chronic Kidney Disease.    Anion gap 12 5 - 15  Glucose, capillary  Status: Abnormal   Collection Time: 11/22/14  1:11 PM  Result Value Ref Range   Glucose-Capillary 56 (L) 70 - 99 mg/dL   Comment 1 Notify RN   Glucose, capillary     Status: Abnormal   Collection Time: 11/22/14  1:37 PM  Result Value Ref Range   Glucose-Capillary 126 (H) 70 - 99 mg/dL   No results found for this or any previous visit (from the past 240 hour(s)). Creatinine:  Recent Labs  11/21/14 1137 11/21/14 1539 11/22/14 0525 11/22/14 1205  CREATININE 3.5* 3.21* 4.00* 3.92*    Xrays: See report/chart See report above  Impression/Assessment:  Chronic hydronephrosis with stents Rare  that this represents stent dysfunction I believe abdominal pain unlikely due to stents/hydro   Plan:  Do not recommend perc tube or stents Answered a number of questions with family Only would reconsider if serum Cr does not gradually decrease/or it increases and family/pt wants [place] to try perc tube(s) or stent changes  Vung Kush A 11/22/2014, 3:19 PM

## 2014-11-22 NOTE — Assessment & Plan Note (Signed)
Metastatic carcinoid tumor with carcinomatosis prompting multiple bowel resection procedures, status post a palliative sigmoid colectomy/colostomy, right colectomy, and gastrojejunostomy in April 2009, status post placement of a palliative gastrostomy tube 07/24/2013.  Until the last few days patient was able to live at home independently.  She administered her own TNA nutrition overnight via her PICC line.  She was also able to give her self additional IV saline hydration on an as-needed basis.  She was administering Phenergan for nausea IV as well.  She continues to also manage her G-tube; which is chronically draining to gravity.  Patient states that she would clamp her to continue to take oral medications.  Reports that her ostomy bag has little output.  Within the past few days patient has become much weaker and with significant increase in pain.  Due to the multitude of worsening symptoms; including hypercalcemia, acute renal insufficiency, increasing nausea/vomiting and pain-patient has agreed to be directly admitted to the hospital for further evaluation and stabilization.  Hospitalist Dr. Rockne Menghini was called and accepted patient.  Brief report was called to receiving floor; and patient was transferred via wheelchair by the nurse to the assigned room with family.

## 2014-11-22 NOTE — Progress Notes (Signed)
Hypoglycemic Event  CBG: 35 on labs; 56 on finger stick  Treatment: D50 IV 25 mL  Symptoms: None  Follow-up CBG: Time:1335 CBG Result:126  Possible Reasons for Event: Unknown  Comments/MD notified:Dr. Lorenda Cahill  Remember to initiate Hypoglycemia Order Set & complete

## 2014-11-22 NOTE — Progress Notes (Signed)
will   SYMPTOM MANAGEMENT CLINIC   HPI: ONEY Dennis 68 y.o. 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  Patient is currently undergoing active observation only.  Patient's daughter called the cancer Center requesting urgent care visit.  Up until the last several days-patient was at home alone and was very independent.  She was able to manage her PICC line home infusions by herself.  She receives TNA every night; and also receives IV fluid rehydration he clinical as-needed basis.  She also gives herself Phenergran IV when needed for nausea.  Patient is now complaining of increased abdominal discomfort, increased nausea/vomiting, and increased weakness.  She denies any recent fevers or chills.  She states she has been too nauseous to keep her blood pressure medications down.  She states the oxycodone that she takes for pain has been ineffective.  Patient is also noted an increase in her chronic left lower extremity edema for the past few days as well.   HPI  CURRENT THERAPY: Upcoming Treatment Dates - carcinoid syndrome Days with orders from any treatment category:  No upcoming days in selected categories.    ROS  Past Medical History  Diagnosis Date  . Complication of anesthesia     slow to wake up  . Blood transfusion without reported diagnosis 2009 and 2011  . Hypertension     off all bp meds since Dec 20, 2012  . Transfusion history     last transfused 05-31-14(2 units)  . Chronic kidney disease 12-19-2012    elevated bun and creatinine   . Portacath in place     right chest.  . Gastrostomy tube in place     palliative gastrostomy since 8'14 Mid abdomen "at waist level""gastric residual"  . Hx of nephrostomy     05-31-14 Left flank area to straight drainage continuosly, last changed 07-11-14. Void very little to none.  . Ileostomy in place     4'09 left abdomen  . On total parenteral nutrition (TPN)     at home through portacath daily 2000 x 14 hours. 24/7  . Cancer  02/19/2008    Metastatic carcinoid tumor(appendix area origin)    Past Surgical History  Procedure Laterality Date  . Bowel resection  06/08/2010    ileocolonic resection, enteroenterostomy  . Left colectomy  04/02/2008    Exploratory laparotomy, biopsy of omental nodule, sigmoid colectomy  . Vaginal hysterectomy      at age 23  . Cholecystectomy      age 71  . Portacath placement Right 02/06/2013    Procedure: Ultrasound guided PORT-A-CATH INSERTION with flouro;  Surgeon: [name], MD;  Location: [facility];  Service: General;  Laterality: Right;  . Esophagogastroduodenoscopy N/A 06/28/2013    Procedure: ESOPHAGOGASTRODUODENOSCOPY (EGD);  Surgeon: Winfield Cunas., MD;  Location: [facility];  Service: Endoscopy;  Laterality: N/A;  . Cystoscopy with retrograde pyelogram, ureteroscopy and stent placement Bilateral 04/08/2014    Procedure: CYSTOSCOPY WITH BILATERAL  RETROGRADE PYELOGRAM,  AND  BILATERAL  STENT PLACEMENT;  Surgeon: Irine Seal, MD;  Location: [facility];  Service: Urology;  Laterality: Bilateral;  . Cystoscopy with retrograde pyelogram, ureteroscopy and stent placement Bilateral 05/03/2014    Procedure: CYSTOSCOPY WITH BILATERAL DOUBLE J STENT EXCHANGE, LEFT URETEROSCOPY;  Surgeon: Bernestine Amass, MD;  Location: [facility];  Service: Urology;  Laterality: Bilateral;  . Cystoscopy w/ ureteral stent placement Bilateral 08/06/2014    Procedure: CYSTOSCOPY WITH RETROGRADE PYELOGRAM BILATERAL STENT EXCHANGE;  Surgeon: Malka So, MD;  Location: [facility];  Service: Urology;  Laterality: Bilateral;  . Peripherally inserted central catheter insertion  10-10-14  . 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     has No Known Allergies.    Medication List       This list is accurate as of: 11/21/14  2:37 PM.  Always use your most recent med list.               amLODipine 5 MG tablet  Commonly known as:  NORVASC  Place 5 mg into feeding tube every morning.     antiseptic oral rinse Liqd  15 mLs by Mouth Rinse route as needed (for dry mouth).     fat emulsion 20 % infusion  Inject 100 mLs into the vein 2 (two) times a week. Doesn't have set days     fluconazole 50 MG tablet  Commonly known as:  DIFLUCAN  Take 1 tablet (50 mg total) by mouth daily. X 7 days     LORazepam 0.5 MG tablet  Commonly known as:  ATIVAN  Place 1 tablet (0.5 mg total) under the tongue every 6 (six) hours as needed for anxiety. Take 1/2 tablet = 0.52m     ondansetron 4 MG/2ML Soln injection  Commonly known as:  ZOFRAN  Inject 4 mg into the vein every 6 (six) hours as needed for nausea or vomiting.     oxyCODONE 20 MG/ML concentrated solution  Commonly known as:  ROXICODONE INTENSOL  Take 0.5 mLs (10 mg total) by mouth every 4 (four) hours as needed for moderate pain or severe pain.     prochlorperazine 5 MG tablet  Commonly known as:  COMPAZINE  Take 1 tablet (5 mg total) by mouth every 6 (six) hours as needed for nausea or vomiting.     TPN ADULT  Inject 2,000 mLs into the vein continuous. 14 hours/day start between 2000-2100 Five days a week With MVI     traMADol 50 MG tablet  Commonly known as:  ULTRAM  Place 50 mg into feeding tube every 6 (six) hours as needed for moderate pain.         PHYSICAL EXAMINATION  Blood pressure 154/105, pulse 122, temperature 98.4 F (36.9 C), temperature source Oral, resp. rate 18, height 5' 3.5" (1.613 m), weight 119 lb 11.2 oz  (54.296 kg), SpO2 99 %.  Physical Exam  Constitutional: She is oriented to person, place, and time. She appears dehydrated. She appears unhealthy. She appears cachectic. She has a sickly appearance.  HENT:  Head: Normocephalic and atraumatic.  Mouth/Throat: Oropharynx is clear and moist.  Eyes: Conjunctivae and EOM are normal. Pupils are equal, round, and reactive to light. Right eye exhibits no discharge. Left eye exhibits no discharge. No scleral icterus.  Neck: Normal range of motion. Neck supple. No JVD present. No tracheal deviation present. No thyromegaly present.  Cardiovascular: Normal rate, regular rhythm, normal heart sounds and intact distal pulses.   Pulmonary/Chest: Effort normal and breath sounds normal. No respiratory distress. She has no wheezes. She has no rales. She exhibits  no tenderness.  Abdominal: Soft. Bowel sounds are normal. She exhibits no distension and no mass. There is tenderness. There is no rebound and no guarding.  Mild generalized abdominal discomfort with palpation.  Musculoskeletal: Normal range of motion. She exhibits edema. She exhibits no tenderness.  Left lower leg with 2+ edema.  No calf tenderness on exam.  All pulses are palpable extremities are warm.  Lymphadenopathy:    She has no cervical adenopathy.  Neurological: She is alert and oriented to person, place, and time.  Patient was too weak to rise from a wheelchair today.  Skin: Skin is warm and dry. No rash noted. No erythema.  Psychiatric: Affect normal.  Nursing note and vitals reviewed.   LABORATORY DATA:. Admission on 11/21/2014  Component Date Value Ref Range Status  . WBC 11/21/2014 11.3* 4.0 - 10.5 K/uL Final  . RBC 11/21/2014 2.97* 3.87 - 5.11 MIL/uL Final  . Hemoglobin 11/21/2014 9.1* 12.0 - 15.0 g/dL Final  . HCT 11/21/2014 29.0* 36.0 - 46.0 % Final  . MCV 11/21/2014 97.6  78.0 - 100.0 fL Final  . MCH 11/21/2014 30.6  26.0 - 34.0 pg Final  . MCHC 11/21/2014 31.4  30.0 - 36.0  g/dL Final  . RDW 11/21/2014 15.5  11.5 - 15.5 % Final  . Platelets 11/21/2014 255  150 - 400 K/uL Final  . Creatinine, Ser 11/21/2014 3.21* 0.50 - 1.10 mg/dL Final  . GFR calc non Af Amer 11/21/2014 14* >90 mL/min Final  . GFR calc Af Amer 11/21/2014 16* >90 mL/min Final   Comment: (NOTE) The eGFR has been calculated using the CKD EPI equation. This calculation has not been validated in all clinical situations. eGFR's persistently <90 mL/min signify possible Chronic Kidney Disease.   . Sodium 11/22/2014 151* 137 - 147 mEq/L Final  . Potassium 11/22/2014 6.4* 3.7 - 5.3 mEq/L Final  . Chloride 11/22/2014 120* 96 - 112 mEq/L Final  . CO2 11/22/2014 18* 19 - 32 mEq/L Final  . Glucose, Bld 11/22/2014 63* 70 - 99 mg/dL Final  . BUN 11/22/2014 100* 6 - 23 mg/dL Final  . Creatinine, Ser 11/22/2014 4.00* 0.50 - 1.10 mg/dL Final  . Calcium 11/22/2014 11.1* 8.4 - 10.5 mg/dL Final  . Total Protein 11/22/2014 6.3  6.0 - 8.3 g/dL Final  . Albumin 11/22/2014 1.9* 3.5 - 5.2 g/dL Final  . AST 11/22/2014 56* 0 - 37 U/L Final  . ALT 11/22/2014 34  0 - 35 U/L Final  . Alkaline Phosphatase 11/22/2014 277* 39 - 117 U/L Final  . Total Bilirubin 11/22/2014 0.4  0.3 - 1.2 mg/dL Final  . GFR calc non Af Amer 11/22/2014 11* >90 mL/min Final  . GFR calc Af Amer 11/22/2014 12* >90 mL/min Final   Comment: (NOTE) The eGFR has been calculated using the CKD EPI equation. This calculation has not been validated in all clinical situations. eGFR's persistently <90 mL/min signify possible Chronic Kidney Disease.   . Anion gap 11/22/2014 13  5 - 15 Final  . Magnesium 11/22/2014 2.2  1.5 - 2.5 mg/dL Final  . Phosphorus 11/22/2014 4.5  2.3 - 4.6 mg/dL Final  . Triglycerides 11/22/2014 105  <150 mg/dL Final   Performed at [facility]  . WBC 11/22/2014 10.9* 4.0 - 10.5 K/uL Final  . RBC 11/22/2014 3.02* 3.87 - 5.11 MIL/uL Final  . Hemoglobin 11/22/2014 9.2* 12.0 - 15.0 g/dL Final  . HCT 11/22/2014 29.6*  36.0 - 46.0 % Final  . MCV 11/22/2014 98.0  78.0 - 100.0 fL Final  . MCH 11/22/2014 30.5  26.0 - 34.0 pg Final  . MCHC 11/22/2014 31.1  30.0 - 36.0 g/dL Final  . RDW 11/22/2014 15.9* 11.5 - 15.5 % Final  . Platelets 11/22/2014 258  150 - 400 K/uL Final  . Neutrophils Relative % 11/22/2014 83* 43 - 77 % Final  . Neutro Abs 11/22/2014 9.0* 1.7 - 7.7 K/uL Final  . Lymphocytes Relative 11/22/2014 10* 12 - 46 % Final  . Lymphs Abs 11/22/2014 1.1  0.7 - 4.0 K/uL Final  . Monocytes Relative 11/22/2014 7  3 - 12 % Final  . Monocytes Absolute 11/22/2014 0.8  0.1 - 1.0 K/uL Final  . Eosinophils Relative 11/22/2014 0  0 - 5 % Final  . Eosinophils Absolute 11/22/2014 0.0  0.0 - 0.7 K/uL Final  . Basophils Relative 11/22/2014 0  0 - 1 % Final  . Basophils Absolute 11/22/2014 0.0  0.0 - 0.1 K/uL Final  . Glucose-Capillary 11/22/2014 46* 70 - 99 mg/dL Final  . Glucose-Capillary 11/22/2014 104* 70 - 99 mg/dL Final  Appointment on 11/21/2014  Component Date Value Ref Range Status  . WBC 11/21/2014 12.7* 3.9 - 10.3 10e3/uL Final  . NEUT# 11/21/2014 11.3* 1.5 - 6.5 10e3/uL Final  . HGB 11/21/2014 10.2* 11.6 - 15.9 g/dL Final  . HCT 11/21/2014 33.1* 34.8 - 46.6 % Final  . Platelets 11/21/2014 266  145 - 400 10e3/uL Final  . MCV 11/21/2014 98.5  79.5 - 101.0 fL Final  . MCH 11/21/2014 30.4  25.1 - 34.0 pg Final  . MCHC 11/21/2014 30.8* 31.5 - 36.0 g/dL Final  . RBC 11/21/2014 3.36* 3.70 - 5.45 10e6/uL Final  . RDW 11/21/2014 15.4* 11.2 - 14.5 % Final  . lymph# 11/21/2014 0.7* 0.9 - 3.3 10e3/uL Final  . MONO# 11/21/2014 0.7  0.1 - 0.9 10e3/uL Final  . Eosinophils Absolute 11/21/2014 0.0  0.0 - 0.5 10e3/uL Final  . Basophils Absolute 11/21/2014 0.0  0.0 - 0.1 10e3/uL Final  . NEUT% 11/21/2014 89.0* 38.4 - 76.8 % Final  . LYMPH% 11/21/2014 5.5* 14.0 - 49.7 % Final  . MONO% 11/21/2014 5.2  0.0 - 14.0 % Final  . EOS% 11/21/2014 0.1  0.0 - 7.0 % Final  . BASO% 11/21/2014 0.2  0.0 - 2.0 % Final  . Sodium  11/21/2014 149* 136 - 145 mEq/L Final  . Potassium 11/21/2014 5.6* 3.5 - 5.1 mEq/L Final  . Chloride 11/21/2014 123* 98 - 109 mEq/L Final  . CO2 11/21/2014 18* 22 - 29 mEq/L Final  . Glucose 11/21/2014 117  70 - 140 mg/dl Final  . BUN 11/21/2014 95.4* 7.0 - 26.0 mg/dL Final  . Creatinine 11/21/2014 3.5* 0.6 - 1.1 mg/dL Final  . Total Bilirubin 11/21/2014 0.50  0.20 - 1.20 mg/dL Final  . Alkaline Phosphatase 11/21/2014 299* 40 - 150 U/L Final  . AST 11/21/2014 42* 5 - 34 U/L Final  . ALT 11/21/2014 34  0 - 55 U/L Final  . Total Protein 11/21/2014 6.5  6.4 - 8.3 g/dL Final  . Albumin 11/21/2014 2.0* 3.5 - 5.0 g/dL Final  . Calcium 11/21/2014 12.2* 8.4 - 10.4 mg/dL Final  . Anion Gap 11/21/2014 8  3 - 11 mEq/L Final  . EGFR 11/21/2014 13* >90 ml/min/1.73 m2 Final   eGFR is calculated using the CKD-EPI Creatinine Equation (2009)     RADIOGRAPHIC STUDIES: Dg Abd Portable 2v  11/21/2014   CLINICAL DATA:  Small bowel obstruction.  Abdominal  pain, nausea.  EXAM: PORTABLE ABDOMEN - 2 VIEW  COMPARISON:  05/30/2014  FINDINGS: Bilateral ureteral stents are in place, stable. Gastrostomy tube projects over the distal stomach. Nonobstructive bowel gas pattern. No free air organomegaly. Visualized lung bases are clear. No acute bony abnormality.  IMPRESSION: Bilateral ureteral stents in stable position. No evidence of bowel obstruction or free air.   Electronically Signed   By: Rolm Baptise M.D.   On: 11/21/2014 16:19    ASSESSMENT/PLAN:    Abdominal pain The patient complaining of of increased abdominal discomfort for the past few days.  She states that taking oxycodone has been ineffective recently.  Acute renal failure Acute renal failure secondary to an obstructing pelvic mass and a left ureter stone, status post placement of bilateral ureter stents 04/08/2014   Recurrent acute renal failure 05/02/2014   Exchange of bilateral ureter stents 05/03/2014, bilateral 7 French stent   Persistent  bilateral hydronephrosis on a renal ultrasound 05/29/2014 and an abdominal CT 05/30/2014   Left percutaneous nephrostomy tube 05/31/2014   Placement of bilateral metal ureter stents 08/06/2014  Creatinine has increased to 3.5 today.  This could very well be related to acute onset dehydration; although patient does appear hypernatremic and hyperchloremic. Patient's potassium was elevated to 5.6 as well today. May very well require further evaluation of bilateral ureter stents placed in August 2015.    Anemia of chronic disease Hemoglobin has decreased to 9.1.  Previously hemoglobin 10.2.  Patient denies any worsening shortness of breath today.  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  Until the last few days patient was able to live at home independently.  She administered her own TNA nutrition overnight via her PICC line.  She was also able to give her self additional IV saline hydration on an as-needed basis.  She was administering Phenergan for nausea IV as well.  She continues to also manage her G-tube; which is chronically draining to gravity.  Patient states that she would clamp her to continue to take oral medications.  Reports that her ostomy bag has little output.  Within the past few days patient has become much weaker and with significant increase in pain.  Due to the multitude of worsening symptoms; including hypercalcemia, acute renal insufficiency, increasing nausea/vomiting and pain-patient has agreed to be directly admitted to the hospital for further evaluation and stabilization.  Hospitalist Dr. Rockne Menghini was called and accepted patient.  Brief report was called to receiving floor; and patient was transferred via wheelchair by the  nurse to the assigned room with family.  HTN (hypertension) Blood pressure today was elevated 154/105.  Patient states she has been too nauseous to keep her antihypertensive medications down for the past few days.  Hypercalcemia Corrected calcium with was 13.8 today.  This is most likely a direct result of patient's carcinoid tumor diagnosis.  Patient will receive treatment for her hypercalcemia while admitted.  Hyperkalemia Potassium has increased to 5.6.  Confirmed the patient is not taking any potassium tablets at home.  Most likely, this is secondary to acute renal insufficiency and probable dehydration.  Hypernatremia Sodium was 149 and chloride was 123 today.  Most likely, this is secondary to acute renal insufficiency.  Will continue to monitor closely while admitted.  Nausea & vomiting Patient typically alternates between Compazine and Phenergan IV while  at home the her PICC line.  Patient states that she remains nausea; and was actually dry heaving while in the exam room today.  Leg edema, left Patient has history of chronic left lower extremity edema.  Patient reports that the left leg edema does increase when she is dehydrated and her creatinine increases.  Patient has had 2 different negative Doppler ultrasounds to rule out DVT.  Patient stated understanding of all instructions; and was in agreement with this plan of care. The patient knows to call the clinic with any problems, questions or concerns.   This was a shared visit with Dr. Benay Spice today.  Total time spent with patient was 40 minutes;  with greater than 75 percent of that time spent in face to face counseling regarding her symptoms, and coordination of care and follow up.  Disclaimer: This note was dictated with voice recognition software. Similar sounding words can inadvertently be transcribed and may not be corrected upon review.   Drue Second, NP 11/22/2014   This was a shared visit with Drue Second. Ms.  Lory was interviewed and examined. She has increased nausea and abdominal pain. She has progressive renal failure and hypercalcemia. Ms. Norlander appears ill today.  I contacted Dr. Rockne Menghini. She agrees to admit Ms. Arrey for intravenous hydration, biphosphonate therapy, and analgesics. I discussed CPR and ACLS issues with Ms. Krupka and her family. She will be placed on a no CODE BLUE status.  Julieanne Manson, M.D.

## 2014-11-22 NOTE — Progress Notes (Signed)
PARENTERAL NUTRITION CONSULT NOTE - INITIAL  Pharmacy Consult for TPN Indication: chronic intestinal obstruction secondary to metastatic carcinoid tumor  No Known Allergies  Patient Measurements: Height: _0  (160 cm) Weight: 119 lb 1.6 oz (54.023 kg) IBW/kg (Calculated) : 52.4   Vital Signs: Temp: 97.3 F (36.3 C) (12/04 0503) Temp Source: Oral (12/04 0503) BP: 155/76 mmHg (12/04 0503) Pulse Rate: 94 (12/04 0503) Intake/Output from previous day: 12/03 0701 - 12/04 0700 In: 1278.3 [I.V.:1278.3] Out: 500 [Urine:150; Drains:350] Intake/Output from this shift: Total I/O In: -  Out: 200 [Drains:200]  Labs:  Recent Labs  11/21/14 1136 11/21/14 1539 11/22/14 0525  WBC 12.7* 11.3* 10.9*  HGB 10.2* 9.1* 9.2*  HCT 33.1* 29.0* 29.6*  PLT 266 255 258     Recent Labs  11/21/14 1137 11/21/14 1539 11/22/14 0525  NA 149*  --  151*  K 5.6*  --  6.4*  CL  --   --  120*  CO2 18*  --  18*  GLUCOSE 117  --  63*  BUN 95.4*  --  100*  CREATININE 3.5* 3.21* 4.00*  CALCIUM 12.2*  --  11.1*  MG  --   --  2.2  PHOS  --   --  4.5  PROT 6.5  --  6.3  ALBUMIN 2.0*  --  1.9*  AST 42*  --  56*  ALT 34  --  34  ALKPHOS 299*  --  277*  BILITOT 0.50  --  0.4  TRIG  --   --  105  Corrected calcium = 13.8 Estimated Creatinine Clearance: 11.1 mL/min (by C-G formula based on Cr of 4).    Recent Labs  11/22/14 0620 11/22/14 0642  GLUCAP 88* 104*    Medical History: Past Medical History  Diagnosis Date  . Complication of anesthesia     slow to wake up  . Blood transfusion without reported diagnosis 2009 and 2011  . Hypertension     off all bp meds since Dec 20, 2012  . Transfusion history     last transfused 05-31-14(2 units)  . Chronic kidney disease 12-19-2012    elevated bun and creatinine   . Portacath in place     right chest.  . Gastrostomy tube in place     palliative gastrostomy since 8'14 Mid abdomen "at waist level""gastric residual"  . Hx of nephrostomy      05-31-14 Left flank area to straight drainage continuosly, last changed 07-11-14. Void very little to none.  . Ileostomy in place     4'09 left abdomen  . On total parenteral nutrition (TPN)     at home through portacath daily 2000 x 14 hours. 24/7  . Cancer 02/19/2008    Metastatic carcinoid tumor(appendix area origin)    Medications:  Scheduled:  . amLODipine  5 mg Per Tube Daily   Infusions:  . sodium chloride 1,000 mL (11/22/14 0745)   PRN: acetaminophen **OR** acetaminophen, antiseptic oral rinse, feeding supplement (RESOURCE BREEZE), HYDROmorphone (DILAUDID) injection, LORazepam, ondansetron, oxyCODONE, promethazine   Insulin Requirements:  None at present   Current Nutrition:  Clear liquids   IVF: 1/2 NS 100 mL/hr  Central access:  PICC TPN start date:   On chronic home TPN  ASSESSMENT  HPI:  68 y/o F with chronic intestinal obstruction secondary to metastatic carcinoid tumor, has venting PEG and ileostomy, on home TPN since 01/2013, CKD with baseline SCr approximately 1.8, admitted from Dr. Gearldine Shown office after she was seen for worsening abdominal pain, nausea with dry heaves.  Orders received to continue TPN with pharmacy dosing assistance.   Significant events:  12/3:   See details of home TPN formula in note from Carolynn Sayers, RN from [place].  Pamidronate 90 mg IV x 1 ordered by MD for hypercalcemia.  Orders received from MD to hold TPN for tonight given azotemia and electrolyte abnormalities. 12/4: Start TPN without electrolytes, kayexelate given for hyperkalemia   Today:   Glucose - <150  Electrolytes - Na, K, Ca elevated  Renal - SCr elevated to approx 2x baseline and worsened  LFTs -  Alk phos and AST elevated (chronic)  TGs -   105 (12/4)  Prealbumin - pending  NUTRITIONAL GOALS                                                                                 Home TPN regimen provides:  55 grams/protein/day and 1070 KCal/day on days without lipids (5 days/week)  55 grams protein/day and 1570 KCal/day on days with lipids (2 days/week)    Clinimix 5/15 @ 40 ml/hr will provide 682 kcal/d and 48 gm protein/d Clinimix 5/15 @ 40 ml/hr + 20% fat emulsion at 10 ml/hr will provide 1162 kcal/d and 48 gm protein/d   PLAN                                                                                                             1. At 1800: start Clinimix 5/15 (without electrolytes) at 40 ml/hr. Will not add fat emulsion today 2. Have family bring in home TPN formula for possible use once electrolytes and renal function have improved.  Spoke with patient's daughter - she will arrange this.  3. TPN to contain standard multivitamins and trace elements daily 4. CMet, Mg, Phos in AM. 5. Routine TPN labs Mondays and Thursdays. 6. Add sensitive SSI and CBG checks q6h 7. Change IVF to D5W 1/2NS at 100 ml/hr now 8. At 1800 decrease IVF to 40 ml/hr 9. Follow-up daily.  Thank you for the consult.  Currie Paris, PharmD, BCPS Pager: [phone] Pharmacy: [phone] 11/22/2014 12:14 PM

## 2014-11-22 NOTE — Progress Notes (Signed)
Hypoglycemic Event  CBG: 43  Treatment: D50 IV 25 mL (unable to view on MAR, as system was having issues.)  Symptoms: Shaky and Nervous/irritable  Follow-up CBG: Time: 0642 CBG Result: 104  Possible Reasons for Event: Medication regimen: Pt was on TNA at home, has been d/c'd here due to labs, but no dextrose in fluids and pt G tube hooked to gravity drainage  Comments/MD notified: Baltazar Najjar NP notified to suggest adding dextrose to fluids     Lori Dennis, Lori Dennis  Remember to initiate Hypoglycemia Order Set & complete

## 2014-11-22 NOTE — Progress Notes (Signed)
At approximately 0710 this am, night nurse notified me of patient's potassium level of 6.4, Dr. Rockne Menghini was notified,new orders received and carried out.Kayexelate 30 grams given through patient's Peg and clamped tube after administration. Also was given D50 1 amp and Novolog insulin 10 units administered per MD order. Will continue to monitor the patient.- Sandie Ano RN

## 2014-11-22 NOTE — Assessment & Plan Note (Signed)
Sodium was 149 and chloride was 123 today.  Most likely, this is secondary to acute renal insufficiency.  Will continue to monitor closely while admitted.

## 2014-11-22 NOTE — Assessment & Plan Note (Signed)
Corrected calcium with was 13.8 today.  This is most likely a direct result of patient's carcinoid tumor diagnosis.  Patient will receive treatment for her hypercalcemia while admitted.

## 2014-11-22 NOTE — Care Management Note (Signed)
11/22/14 Marney Doctor RN,BSN,NCM [phone]  Was asked by nursing to come see patient's family about discharge options.  Daughter with many questions about options for care with patient's disease state progressing and she states that Dr. Benay Spice mentioned Hospice to her.   Spoke with daughter about [place] services vs [facility].  Family wishes to keep pt at her home as long as possible.  She is active with [facility] for [facility] at this time.  Daughter inquired about private duty care as well.  Daughter given Private duty care list as well as list of [place].  Daughter to discuss options with family and to request CM if further assistance needed.  CM will continue to follow.

## 2014-11-22 NOTE — Assessment & Plan Note (Signed)
Patient has history of chronic left lower extremity edema.  Patient reports that the left leg edema does increase when she is dehydrated and her creatinine increases.  Patient has had 2 different negative Doppler ultrasounds to rule out DVT.

## 2014-11-22 NOTE — Assessment & Plan Note (Signed)
Patient typically alternates between Compazine and Phenergan IV while at home the her PICC line.  Patient states that she remains nausea; and was actually dry heaving while in the exam room today.

## 2014-11-22 NOTE — Assessment & Plan Note (Addendum)
Acute renal failure secondary to an obstructing pelvic mass and a left ureter stone, status post placement of bilateral ureter stents 04/08/2014   Recurrent acute renal failure 05/02/2014   Exchange of bilateral ureter stents 05/03/2014, bilateral 7 French stent   Persistent bilateral hydronephrosis on a renal ultrasound 05/29/2014 and an abdominal CT 05/30/2014   Left percutaneous nephrostomy tube 05/31/2014   Placement of bilateral metal ureter stents 08/06/2014  Creatinine has increased to 3.5 today.  This could very well be related to acute onset dehydration; although patient does appear hypernatremic and hyperchloremic. Patient's potassium was elevated to 5.6 as well today. May very well require further evaluation of bilateral ureter stents placed in August 2015.

## 2014-11-22 NOTE — Assessment & Plan Note (Signed)
The patient complaining of of increased abdominal discomfort for the past few days.  She states that taking oxycodone has been ineffective recently.

## 2014-11-22 NOTE — Progress Notes (Signed)
IP PROGRESS NOTE  Subjective:   She was admitted yesterday with increased abdominal pain and nausea. The creatinine was elevated above baseline and she was noted to have hypercalcemia. Her family is at the bedside this morning. Ms. Graefe is confused.  Objective: Vital signs in last 24 hours: Blood pressure 138/76, pulse 94, temperature 97.3 F (36.3 C), temperature source Oral, resp. rate 18, height 5\' 3"  (1.6 m), weight 119 lb 1.6 oz (54.023 kg), SpO2 99 %.  Intake/Output from previous day: 12/03 0701 - 12/04 0700 In: 1278.3 [I.V.:1278.3] Out: 500 [Urine:150; Drains:350]  Physical Exam:  HEENT: Thick dark coat over the tongue. Lungs: Clear anteriorly Cardiac: Regular rate and rhythm Abdomen: Left upper quadrant gastrostomy tube, colostomy bag in the left lower abdomen is empty. Tender in the lower abdomen with diffuse firmness Extremities: 1+ pitting edema at the left lower leg, trace edema at the right lower leg   Portacath/PICC-without erythema  Lab Results:  Recent Labs  11/21/14 1539 11/22/14 0525  WBC 11.3* 10.9*  HGB 9.1* 9.2*  HCT 29.0* 29.6*  PLT 255 258    BMET  Recent Labs  11/22/14 0525 11/22/14 1205  NA 151* 145  K 6.4* 5.2  CL 120* 117*  CO2 18* 16*  GLUCOSE 63* 35*  BUN 100* 96*  CREATININE 4.00* 3.92*  CALCIUM 11.1* 10.4    Studies/Results: US Renal  11/22/2014   CLINICAL DATA:  Acute renal failure, bilateral stance, history of hydronephrosis  EXAM: RENAL/URINARY TRACT ULTRASOUND COMPLETE  COMPARISON:  Abdominal ultrasound of May 29, 2014.  FINDINGS: Right Kidney:  Length: 9.6 cm. There is mild hydronephrosis. The cortical echotexture remains lower than that of the adjacent liver. Mild cortical thinning is suspected. The stent is visible.  Left Kidney:  Length: 11.7 cm. There is moderate hydronephrosis. The stent is visible. The renal cortical echotexture is grossly normal.  Bladder:  A Foley catheter as well as bilateral ureteral stents  are demonstrated.  IMPRESSION: 1. There is mild hydronephrosis on the right and moderate hydronephrosis on the left. The stents are visible bilaterally. 2. The urinary bladder is unremarkable and contains the Foley catheter and the lower most portions of both stents.   Electronically Signed   By: David  Martinique   On: 11/22/2014 12:31   Dg Abd Portable 2v  11/21/2014   CLINICAL DATA:  Small bowel obstruction.  Abdominal pain, nausea.  EXAM: PORTABLE ABDOMEN - 2 VIEW  COMPARISON:  05/30/2014  FINDINGS: Bilateral ureteral stents are in place, stable. Gastrostomy tube projects over the distal stomach. Nonobstructive bowel gas pattern. No free air organomegaly. Visualized lung bases are clear. No acute bony abnormality.  IMPRESSION: Bilateral ureteral stents in stable position. No evidence of bowel obstruction or free air.   Electronically Signed   By: Rolm Baptise M.D.   On: 11/21/2014 16:19    Medications: I have reviewed the patient's current medications.  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 Acute renal failure secondary to an obstructing pelvic mass and a left ureter stone, status post placement of bilateral ureter stents 04/08/2014   Recurrent acute renal failure 05/02/2014   Exchange of bilateral ureter stents 05/03/2014, bilateral 7 French stent   Persistent bilateral hydronephrosis on a renal ultrasound 05/29/2014 and an abdominal CT 05/30/2014   Left percutaneous nephrostomy tube 05/31/2014  Placement of bilateral metal ureter stents 08/06/2014  Progressive renal failure with hyperkalemia 11/22/2014 3. history of Gross hematuria-likely secondary to the left ureter stone  4. Chronic left leg edema-negative Doppler 11/03/2012 and  05/03/2014  5. Nutrition-maintained on home TNA  6. Right scalp herpes zoster rash July 2013  7. Pain secondary to progressive carcinomatosis and urinary progressive 8. anemia secondary to chronic disease, GU blood loss, and renal failure, status post a red cell transfusion 05/31/2014  9. Elevated calcium-potentially related to renal failure/TNA versus hypercalcemia malignancy. She received pamidronate 05/29/2014 and 10/11/2014 10. Hypertension -likely secondary to obstructive nephropathy, improved  11. Urinalysis 08/19/2014 with too numerous to count white cells, culture positive for Streptococcus viridans and a coagulase negative Staphylococcus, she was unable to tolerate Macrobid  12. Removal of a right chest infected Port-A-Cath 10/14/2014 13.  Altered mental status secondary to hypercalcemia, renal failure, and polypharmacy 14.  Nausea and vomiting secondary to carcinomatosis with bowel obstruction and renal failure  Ms. Loughry has metastatic carcinoid tumor with carcinomatosis. She has a history of multilevel bowel obstructions. She is admitted with increased abdominal pain and progressive renal failure. She has hypercalcemia and hyperkalemia. She is confused this morning.  I discussed the poor prognosis with her daughters. They understand the renal function may not recover to baseline. She is not a candidate for hemodialysis. There she is concern is her comfort. I recommend a [facility] hospice referral for home care or [facility] if the renal function and her performance status do not improve over the next few days.  Recommendations: 1. Renal ultrasound 2. Acute management of hyperkalemia per Dr. Rockne Menghini 3. Check urinalysis and culture to look for evidence of an infection though urinary stents may be associated with pyuria/hematuria 4. Consult Dr. Jeffie Pollock if there is significant hydronephrosis on the ultrasound. 5. [place] hospice referral if the renal failure and her  performance status do not improve over the next one to 2 days.  Please call oncology as needed over the weekend. I will check on her 11/25/2014.  I appreciate the care from Dr. Rockne Menghini      LOS: 1 day   [facility], Shavon Zenz  11/22/2014, 2:03 PM

## 2014-11-22 NOTE — Progress Notes (Signed)
INITIAL NUTRITION ASSESSMENT  DOCUMENTATION CODES Per approved criteria  -Not Applicable   INTERVENTION: - Resume home TPN regimen per pharmacy. - Resource Breeze po PRN, each supplement provides 250 kcal and 9 grams of protein - RD will continue to monitor  NUTRITION DIAGNOSIS: Inadequate oral intake related to inability to eat as evidenced by clear liquid diet.   Goal: Pt to meet >/= 90% of their estimated nutrition needs  Monitor:  TPN orders, TPN tolerance, labs, I/O's  Reason for Assessment: Consult for new TPN  68 y.o. female  Admitting Dx: SBO (small bowel obstruction)  ASSESSMENT: 68 y.o. female with a PMH of metastatic carcinoid with peritoneal metastasis, bilateral hydronephrosis status post bilateral metal stents, chronic intestinal obstruction status post venting PEG and ileostomy on TNA since 01/2013, CKD with baseline creatinine around 1.8, who was referred as a direct admission from Dr. Gearldine Shown office after she was seen for worsening abdominal pain, nausea with dry heaves.  - Per pt's family, pt tolerates TPN at home 14 hours per day. Some days she will eat bites of food such as "a cracker" or soup. She drinks sips of fluids such as water, tea, sprite, and ginger ale. Current diet is clear liquids. Pt has tried nutritional supplements in the past and did not like the taste, but family agreed to try [employer].  - Spoke with Pharmacist who said that pt's TPN is being held due to worsening renal function. Will resume home TPN regimen once labs improve. Family agreed to bring home TPN formula.   Height: Ht Readings from Last 1 Encounters:  11/21/14 5\' 3"  (1.6 m)    Weight: Wt Readings from Last 1 Encounters:  11/21/14 119 lb 1.6 oz (54.023 kg)    Ideal Body Weight: 52.4 kg  % Ideal Body Weight: 103%  Wt Readings from Last 10 Encounters:  11/21/14 119 lb 1.6 oz (54.023 kg)  11/21/14 119 lb 11.2 oz (54.296 kg)  11/06/14 109 lb 8 oz (49.669 kg)   10/09/14 113 lb 3.2 oz (51.347 kg)  09/05/14 109 lb 9.6 oz (49.714 kg)  07/25/14 117 lb 2 oz (53.128 kg)  07/25/14 116 lb 6.4 oz (52.799 kg)  06/25/14 115 lb 1.6 oz (52.209 kg)  05/30/14 135 lb 9.3 oz (61.5 kg)  05/28/14 131 lb 8 oz (59.648 kg)    Usual Body Weight: 124 lbs per previous medical records  % Usual Body Weight: 96%  BMI:  Body mass index is 21.1 kg/(m^2).  Estimated Nutritional Needs: Kcal: 1350-1620 Protein: 65-75 g Fluid: 1.6 L/day  Skin: intact  Diet Order: Diet clear liquid  EDUCATION NEEDS: -No education needs identified at this time   Intake/Output Summary (Last 24 hours) at 11/22/14 1006 Last data filed at 11/22/14 0600  Gross per 24 hour  Intake 1278.33 ml  Output    500 ml  Net 778.33 ml    Last BM: none recorded   Labs:   Recent Labs Lab 11/21/14 1137 11/21/14 1539 11/22/14 0525  NA 149*  --  151*  K 5.6*  --  6.4*  CL  --   --  120*  CO2 18*  --  18*  BUN 95.4*  --  100*  CREATININE 3.5* 3.21* 4.00*  CALCIUM 12.2*  --  11.1*  MG  --   --  2.2  PHOS  --   --  4.5  GLUCOSE 117  --  63*    CBG (last 3)   Recent Labs  11/22/14  6468 11/22/14 0642  GLUCAP 46* 104*    Scheduled Meds: . amLODipine  5 mg Per Tube Daily    Continuous Infusions: . sodium chloride 1,000 mL (11/22/14 0745)    Past Medical History  Diagnosis Date  . Complication of anesthesia     slow to wake up  . Blood transfusion without reported diagnosis 2009 and 2011  . Hypertension     off all bp meds since Dec 20, 2012  . Transfusion history     last transfused 05-31-14(2 units)  . Chronic kidney disease 12-19-2012    elevated bun and creatinine   . Portacath in place     right chest.  . Gastrostomy tube in place     palliative gastrostomy since 8'14 Mid abdomen "at waist level""gastric residual"  . Hx of nephrostomy     05-31-14 Left flank area to straight drainage continuosly, last changed 07-11-14. Void very little to none.  . Ileostomy in  place     4'09 left abdomen  . On total parenteral nutrition (TPN)     at home through portacath daily 2000 x 14 hours. 24/7  . Cancer 02/19/2008    Metastatic carcinoid tumor(appendix area origin)    Past Surgical History  Procedure Laterality Date  . Bowel resection  06/08/2010    ileocolonic resection, enteroenterostomy  . Left colectomy  04/02/2008    Exploratory laparotomy, biopsy of omental nodule, sigmoid colectomy  . Vaginal hysterectomy      at age 58  . Cholecystectomy      age 4  . Portacath placement Right 02/06/2013    Procedure: Ultrasound guided PORT-A-CATH INSERTION with flouro;  Surgeon: [name], MD;  Location: [facility];  Service: General;  Laterality: Right;  . Esophagogastroduodenoscopy N/A 06/28/2013    Procedure: ESOPHAGOGASTRODUODENOSCOPY (EGD);  Surgeon: Winfield Cunas., MD;  Location: [facility];  Service: Endoscopy;  Laterality: N/A;  . Cystoscopy with retrograde pyelogram, ureteroscopy and stent placement Bilateral 04/08/2014    Procedure: CYSTOSCOPY WITH BILATERAL  RETROGRADE PYELOGRAM,  AND  BILATERAL  STENT PLACEMENT;  Surgeon: Irine Seal, MD;  Location: [facility];  Service: Urology;  Laterality: Bilateral;  . Cystoscopy with retrograde pyelogram, ureteroscopy and stent placement Bilateral 05/03/2014    Procedure: CYSTOSCOPY WITH BILATERAL DOUBLE J STENT EXCHANGE, LEFT URETEROSCOPY;  Surgeon: Bernestine Amass, MD;  Location: [facility];  Service: Urology;  Laterality: Bilateral;  . Cystoscopy w/ ureteral stent placement Bilateral 08/06/2014    Procedure: CYSTOSCOPY WITH RETROGRADE PYELOGRAM BILATERAL STENT EXCHANGE;  Surgeon: Malka So, MD;  Location: [facility];  Service: Urology;  Laterality: Bilateral;  . Peripherally inserted central catheter insertion  10-10-14  . Port-a-cath removal N/A 10/14/2014    Procedure: REMOVAL PORT-A-CATH;  Surgeon: Jackolyn Confer, MD;  Location: [facility];  Service: General;  Laterality: N/A;    Laurette Schimke MS, RD, LDN

## 2014-11-22 NOTE — Assessment & Plan Note (Signed)
Hemoglobin has decreased to 9.1.  Previously hemoglobin 10.2.  Patient denies any worsening shortness of breath today.

## 2014-11-22 NOTE — Assessment & Plan Note (Signed)
Blood pressure today was elevated 154/105.  Patient states she has been too nauseous to keep her antihypertensive medications down for the past few days.

## 2014-11-22 NOTE — Progress Notes (Signed)
VASCULAR LAB PRELIMINARY  PRELIMINARY  PRELIMINARY  PRELIMINARY  Left lower extremity venous duplex completed.    Preliminary report:  Left:  No evidence of DVT, superficial thrombosis, or Baker's cyst.  Nathanuel Cabreja, RVT 11/22/2014, 12:05 PM

## 2014-11-22 NOTE — Progress Notes (Signed)
Progress Note   Lori Dennis RKY:[id number] DOB: Mar 08, 1946 DOA: 11/21/2014 PCP: Irven Shelling, MD   Brief Narrative:   Lori Dennis is an 68 y.o. female with a PMH of metastatic carcinoid with peritoneal metastasis, bilateral hydronephrosis status post bilateral metal stents, chronic intestinal obstruction status post venting PEG and ileostomy on TNA since 01/2013, CKD with baseline creatinine around 1.8, who was referred as a direct admission from Dr. Gearldine Shown office 11/21/14 after she was seen for worsening abdominal pain, nausea with dry heaves.Labs showed worsening renal function, hyperkalemia and hypercalcemia.  Assessment/Plan:   Principal Problem:  SBO (small bowel obstruction), chronic intermittent secondary to peritoneal carcinomatosis with nausea, vomiting and abdominal pain  PEG to suction PRN.  IV anti-emetics as needed.  KUB ordered, no frank bowel obstruction noted.  Treat pain with IV Dilaudid.  Active Problems:   Left leg edema  Doppler pending.    Rigors  Afebrile, shaking may be from uremia and sympathetic activation from active nausea/dry heaves and pain, but will check U/A and culture.   Carcinoid tumor of intestine  Dr. Benay Spice following.  May need Hospice if her condition continues to deteriorate.   Dehydration / Hypernatremia   Change IV fluids to hypotonic .   Anemia of chronic disease  Hemoglobin stable with no current indication for transfusion.   Acute-on-chronic kidney injury  History of bilateral obstructive nephropathy secondary to ureteral obstruction, status post ureterano recovery of renal function yet.  Renal ultrasound ordered to evaluate for recurrent hydronephrosis.    Hypercalcemia  Given pamidronate 11/21/14.   Hyperkalemia  Worse today. We'll give 30 g of Kayexalate, 1 amp of D50 and 10 units of insulin.   DVT prophylaxis  Lovenox ordered.  Code Status: DNR Family Communication:  Daughter Lori Dennis [phone], Lori Dennis [phone] updated at bedside. Disposition Plan: Home when stable.   IV Access:    PICC   Procedures and diagnostic studies:   Dg Abd Portable 2v 11/21/2014: Bilateral ureteral stents in stable position. No evidence of bowel obstruction or free air.      Medical Consultants:    Dr. Julieanne Manson, Oncology.  Anti-Infectives:    None.  Subjective:   Lori Dennis feels nauseated with dry heaves, and reports significant lower abdominal pain.  Her NG tube is currently clamped after giving her kayexalate.  She is shaking/cold.    Objective:    Filed Vitals:   11/21/14 1449 11/21/14 2115 11/22/14 0503  BP: 167/104 149/86 155/76  Pulse: 110 105 94  Temp: 97.9 F (36.6 C) 97.4 F (36.3 C) 97.3 F (36.3 C)  TempSrc: Oral Oral Oral  Resp: 20 18 18   Height: 5\' 3"  (1.6 m)    Weight: 54.023 kg (119 lb 1.6 oz)    SpO2: 100% 100% 99%    Intake/Output Summary (Last 24 hours) at 11/22/14 0736 Last data filed at 11/22/14 0600  Gross per 24 hour  Intake 1278.33 ml  Output    500 ml  Net 778.33 ml    Exam: Gen:  Uncomfortable with active nausea/dry heaves Cardiovascular:  Tachy/reg, No M/R/G Respiratory:  Lungs CTAB Gastrointestinal:  Abdomen firm lower quadrants, + BS Extremities:  Marked assymetric edema on left   Data Reviewed:    Labs: Basic Metabolic Panel:  Recent Labs Lab 11/21/14 1137 11/21/14 1539 11/22/14 0525  NA 149*  --  151*  K 5.6*  --  6.4*  CL  --   --  120*  CO2 18*  --  18*  GLUCOSE 117  --  63*  BUN 95.4*  --  100*  CREATININE 3.5* 3.21* 4.00*  CALCIUM 12.2*  --  11.1*  MG  --   --  2.2  PHOS  --   --  4.5   GFR Estimated Creatinine Clearance: 11.1 mL/min (by C-G formula based on Cr of 4). Liver Function Tests:  Recent Labs Lab 11/21/14 1137 11/22/14 0525  AST 42* 56*  ALT 34 34  ALKPHOS 299* 277*  BILITOT 0.50 0.4  PROT 6.5 6.3  ALBUMIN 2.0* 1.9*     CBC:  Recent Labs Lab 11/21/14 1136 11/21/14 1539 11/22/14 0525  WBC 12.7* 11.3* 10.9*  NEUTROABS 11.3*  --  9.0*  HGB 10.2* 9.1* 9.2*  HCT 33.1* 29.0* 29.6*  MCV 98.5 97.6 98.0  PLT 266 255 258   CBG:  Recent Labs Lab 11/22/14 0620 11/22/14 0642  GLUCAP 46* 104*   Lipid Profile:  Recent Labs  11/22/14 0525  TRIG 105   Microbiology No results found for this or any previous visit (from the past 240 hour(s)).   Medications:   . amLODipine  5 mg Per Tube Daily  . dextrose  1 ampule Intravenous Once  . insulin regular  10 Units Subcutaneous Once  . sodium polystyrene  30 g Per Tube Once   Continuous Infusions: . sodium chloride 100 mL/hr at 11/22/14 0241    Time spent: 35 minutes with > 50% of time discussing current diagnostic test results, clinical impression and plan of care.    LOS: 1 day   RAMA,CHRISTINA  [facility] Pager [phone]. If unable to reach me by pager, please call my cell phone at [phone].  *Please refer to [email or web address], password [email or web address] to get updated schedule on who will round on this patient, as hospitalists switch teams weekly. If 7PM-7AM, please contact night-coverage at [email or web address], password [email or web address] for any overnight needs.  11/22/2014, 7:36 AM

## 2014-11-23 DIAGNOSIS — R109 Unspecified abdominal pain: Secondary | ICD-10-CM

## 2014-11-23 DIAGNOSIS — E162 Hypoglycemia, unspecified: Secondary | ICD-10-CM

## 2014-11-23 LAB — GLUCOSE, CAPILLARY
GLUCOSE-CAPILLARY: 155 mg/dL — AB (ref 70–99)
GLUCOSE-CAPILLARY: 137 mg/dL — AB (ref 70–99)
Glucose-Capillary: 131 mg/dL — ABNORMAL HIGH (ref 70–99)
Glucose-Capillary: 133 mg/dL — ABNORMAL HIGH (ref 70–99)
Glucose-Capillary: 136 mg/dL — ABNORMAL HIGH (ref 70–99)

## 2014-11-23 LAB — COMPREHENSIVE METABOLIC PANEL
ALT: 35 U/L (ref 0–35)
ANION GAP: 13 (ref 5–15)
AST: 59 U/L — ABNORMAL HIGH (ref 0–37)
Albumin: 1.7 g/dL — ABNORMAL LOW (ref 3.5–5.2)
Alkaline Phosphatase: 252 U/L — ABNORMAL HIGH (ref 39–117)
BUN: 106 mg/dL — AB (ref 6–23)
CO2: 18 mEq/L — ABNORMAL LOW (ref 19–32)
CREATININE: 4.7 mg/dL — AB (ref 0.50–1.10)
Calcium: 10.4 mg/dL (ref 8.4–10.5)
Chloride: 117 mEq/L — ABNORMAL HIGH (ref 96–112)
GFR calc Af Amer: 10 mL/min — ABNORMAL LOW (ref >90)
GFR calc non Af Amer: 9 mL/min — ABNORMAL LOW (ref >90)
GLUCOSE: 169 mg/dL — AB (ref 70–99)
Potassium: 5.5 mEq/L — ABNORMAL HIGH (ref 3.7–5.3)
Sodium: 148 mEq/L — ABNORMAL HIGH (ref 137–147)
TOTAL PROTEIN: 6 g/dL (ref 6.0–8.3)
Total Bilirubin: 0.2 mg/dL — ABNORMAL LOW (ref 0.3–1.2)

## 2014-11-23 LAB — MAGNESIUM: Magnesium: 2.1 mg/dL (ref 1.5–2.5)

## 2014-11-23 LAB — PHOSPHORUS: PHOSPHORUS: 3.9 mg/dL (ref 2.3–4.6)

## 2014-11-23 MED ORDER — PROMETHAZINE HCL 25 MG/ML IJ SOLN
12.5000 mg | INTRAMUSCULAR | Status: DC | PRN
  Administered 2014-11-23 – 2014-11-24 (×5): 12.5 mg via INTRAVENOUS
  Filled 2014-11-23 (×5): qty 1

## 2014-11-23 MED ORDER — FENTANYL 12 MCG/HR TD PT72
MEDICATED_PATCH | TRANSDERMAL | Status: AC
  Filled 2014-11-23: qty 1

## 2014-11-23 MED ORDER — FENTANYL 12 MCG/HR TD PT72
12.5000 ug | MEDICATED_PATCH | TRANSDERMAL | Status: DC
  Administered 2014-11-23: 12.5 ug via TRANSDERMAL

## 2014-11-23 MED ORDER — HYDROMORPHONE HCL 1 MG/ML IJ SOLN
1.0000 mg | Freq: Two times a day (BID) | INTRAMUSCULAR | Status: DC | PRN
Start: 2014-11-23 — End: 2014-11-23
  Administered 2014-11-23: 1 mg via INTRAVENOUS

## 2014-11-23 MED ORDER — TRACE MINERALS CR-CU-F-FE-I-MN-MO-SE-ZN IV SOLN
INTRAVENOUS | Status: DC
  Administered 2014-11-23: 18:00:00 via INTRAVENOUS
  Filled 2014-11-23: qty 1000

## 2014-11-23 MED ORDER — HYDROMORPHONE HCL 1 MG/ML IJ SOLN
1.0000 mg | INTRAMUSCULAR | Status: DC | PRN
  Administered 2014-11-23 – 2014-11-24 (×5): 1 mg via INTRAVENOUS
  Filled 2014-11-23 (×5): qty 1

## 2014-11-23 NOTE — Consult Note (Signed)
Renal Service Consult Note [place] 11/23/2014 Sol Blazing Requesting Physician:  Dr Rockne Menghini  Reason for Consult:  Acute/Chronic renal failure HPI: The patient is a 68 y.o. year-old with hx of carcinoid tumor dating back to 0865, has had complications of bowel obstruction requiring surgery in 2009 and again in 2011.  Had progressive disease and developed obstructive uropathy and had bilat JJ stents placed in April 2015 with improvement in creat down to 2.52 at dc .  In May 2015 had acute on CRF and had stents exchanged with improved creat down to 4.82 at discharge. In June had acute on CRF and bilat hydro > L perc nephrostomy was placed with good UOP and creat down to 3.01 on dc. Patient presents now with abd pain, nausea, dry heaves. She has venting PEG due to intermittent bowel obstruction issues. Alb 1.7 and Ca 10.4, adj Ca 12.2.   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  Recovered an dc'd home.  12/13 - nausea/ vomiting > CT showed SBO, mild progression of liver mets, stable appearance of omental disease; also AKI, dehydration. Treated nonsurgically with NG, TPN, IVF's. AKI resolved, diet advanced and SBO resolved. Good ostomy output 4/20 - 04/10/14 > nausea, abd pain, hematuria > per urol had bilat ureteral obstruction d/t to carcinoid tumor, also distal left renal stone. Underwent cysto with bilat stent insertion.  Creat 5.4 > 2.52 at discharge, pt felt much better.  CT showed progressive peritoneal carcinomatosis with large abdominopelvic mass causing bilat distal ureteral obstruction and new moderate size L pleural effusion with bilat R>L hydronephrosis.  Got TNA much of hosp stay 5/14 - 05/16/14 > admitted with fatigue and rising creat to 5.1 > admitted, rx IVF's, underwent bilat JJ exchange, TPN. At dc creat was down to 4.82. Had LLE edema with neg doppler, had anemia Hb 7-8, not transfused.  6/11 - 06/04/14 > vomiting and abd pain, rising creat noted by oncologist > Creat 4.3, CT showed perit carcinomatosis with tumor studding the liver surgace and bilat hydronephrosis with good position of double-J ureteral stents and increase size of pelvic mass.  Renal US + R>L hydro. Perc neph placed on left and UOP was good, creat came down to 3.01 at discharge.  Seen by pall care and hospice referral was made. DC'd in improved condition.    Past Medical History  Past Medical History  Diagnosis Date  . Complication of anesthesia     slow to wake up  . Blood transfusion without reported diagnosis 2009 and 2011  . Hypertension     off all bp meds since Dec 20, 2012  . Transfusion history     last transfused 05-31-14(2 units)  . Chronic kidney disease 12-19-2012    elevated bun and creatinine   . Portacath in place     right chest.  . Gastrostomy tube in place     palliative gastrostomy since 8'14 Mid abdomen "at waist level""gastric residual"   . Hx of nephrostomy     05-31-14 Left flank area to straight drainage continuosly, last changed 07-11-14. Void very little to none.  . Ileostomy in place     4'09 left abdomen  . On total parenteral nutrition (TPN)     at home through portacath daily 2000 x 14 hours. 24/7  . Cancer 02/19/2008    Metastatic carcinoid tumor(appendix area origin)   Past Surgical History  Past Surgical History  Procedure Laterality Date  . Bowel resection  06/08/2010    ileocolonic resection, enteroenterostomy  . Left colectomy  04/02/2008    Exploratory laparotomy, biopsy of omental nodule, sigmoid colectomy  . Vaginal hysterectomy      at age 68  . Cholecystectomy      age 60  . Portacath placement Right 02/06/2013    Procedure: Ultrasound guided PORT-A-CATH INSERTION with flouro;  Surgeon: [name], MD;  Location: [facility];  Service: General;  Laterality: Right;  . Esophagogastroduodenoscopy N/A 06/28/2013    Procedure: ESOPHAGOGASTRODUODENOSCOPY (EGD);  Surgeon: Winfield Cunas., MD;  Location: [facility];  Service: Endoscopy;  Laterality: N/A;  . Cystoscopy with retrograde pyelogram, ureteroscopy and stent placement Bilateral 04/08/2014    Procedure: CYSTOSCOPY WITH BILATERAL  RETROGRADE PYELOGRAM,  AND  BILATERAL  STENT PLACEMENT;  Surgeon: Irine Seal, MD;  Location: [facility];  Service: Urology;  Laterality: Bilateral;  . Cystoscopy with retrograde pyelogram, ureteroscopy and stent placement Bilateral 05/03/2014    Procedure: CYSTOSCOPY WITH BILATERAL DOUBLE J STENT EXCHANGE, LEFT URETEROSCOPY;  Surgeon: Bernestine Amass, MD;  Location: [facility];  Service: Urology;  Laterality: Bilateral;  . Cystoscopy w/ ureteral stent placement Bilateral 08/06/2014    Procedure: CYSTOSCOPY WITH RETROGRADE PYELOGRAM BILATERAL STENT EXCHANGE;  Surgeon: Malka So, MD;  Location: [facility];  Service: Urology;  Laterality: Bilateral;  . Peripherally inserted central catheter insertion  10-10-14  . Port-a-cath removal N/A  10/14/2014    Procedure: REMOVAL PORT-A-CATH;  Surgeon: Jackolyn Confer, MD;  Location: [facility];  Service: General;  Laterality: N/A;   Family History  Family History  Problem Relation Age of Onset  . Heart disease Mother   . Cancer Sister     ovarian   Social History  reports that she has never smoked. She has never used smokeless tobacco. She reports that she does not drink alcohol or use illicit drugs. Allergies No Known Allergies Home medications Prior to Admission medications   Medication Sig Start Date End Date Taking? Authorizing Provider  amLODipine (NORVASC) 5 MG tablet Place 5 mg into feeding tube every morning.    Yes Historical Provider, MD  antiseptic oral rinse (BIOTENE) LIQD 15 mLs by Mouth Rinse route as needed (for dry mouth).   Yes Historical Provider, MD  fat emulsion 20 % infusion Inject 100 mLs into the vein 2 (two) times a week. Doesn't have set days   Yes  Historical Provider, MD  LORazepam (ATIVAN) 0.5 MG tablet Place 1 tablet (0.5 mg total) under the tongue every 6 (six) hours as needed for anxiety. Take 1/2 tablet = 0.16m 11/06/14  Yes GLadell Pier MD  ondansetron ([facility] 4 MG/2ML SOLN injection Inject 4 mg into the vein every 6 (six) hours as needed for nausea or vomiting.   Yes Historical Provider, MD  oxyCODONE (ROXICODONE INTENSOL) 20 MG/ML concentrated solution Take 0.5 mLs (10 mg total) by mouth every 4 (four) hours as needed for moderate pain or severe pain. Patient taking differently: Place 5-10 mg into feeding tube every 4 (four) hours as needed for moderate pain or severe pain.  09/05/14  Yes GLadell Pier MD  prochlorperazine (COMPAZINE) 5 MG tablet Take 1 tablet (5 mg total) by mouth every 6 (six) hours as needed for nausea or vomiting. Patient taking differently: Place 5 mg into feeding tube every 6 (six) hours as needed for nausea or vomiting.  11/06/14  Yes GLadell Pier MD  promethazine (PHENERGAN) 25 MG/ML injection Inject 12.5 mg into the  vein every 6 (six) hours as needed for nausea or vomiting.   Yes Historical Provider, MD  TPN ADULT Inject 2,000 mLs into the vein continuous. 14 hours/day start between 2000-2100 Five days a week With MVI   Yes Historical Provider, MD  traMADol (ULTRAM) 50 MG tablet Place 50 mg into feeding tube every 6 (six) hours as needed for moderate pain.    Yes Historical Provider, MD  fluconazole (DIFLUCAN) 50 MG tablet Take 1 tablet (50 mg total) by mouth daily. X 7 days Patient not taking: Reported on 11/21/2014 10/09/14   GLadell Pier MD   Liver Function Tests  Recent Labs Lab 11/21/14 1137 11/22/14 0525 11/23/14 0520  AST 42* 56* 59*  ALT 34 34 35  ALKPHOS 299* 277* 252*  BILITOT 0.50 0.4 0.2*  PROT 6.5 6.3 6.0  ALBUMIN 2.0* 1.9* 1.7*   No results for input(s): LIPASE, AMYLASE in the last 168 hours. CBC  Recent Labs Lab 11/21/14 1136 11/21/14 1539 11/22/14 0525  WBC 12.7* 11.3* 10.9*  NEUTROABS 11.3*  --  9.0*  HGB 10.2* 9.1* 9.2*  HCT 33.1* 29.0* 29.6*  MCV 98.5 97.6 98.0  PLT 266 255 2109  Basic Metabolic Panel  Recent Labs Lab 11/21/14 1137 11/21/14 1539 11/22/14 0525 11/22/14 1205 11/23/14 0520  NA 149*  --  151* 145 148*  K 5.6*  --  6.4* 5.2 5.5*  CL  --   --  120* 117* 117*  CO2 18*  --  18* 16* 18*  GLUCOSE 117  --  63* 35* 169*  BUN 95.4*  --  100* 96* 106*  CREATININE 3.5* 3.21* 4.00* 3.92* 4.70*  CALCIUM 12.2*  --  11.1* 10.4 10.4  PHOS  --   --  4.5  --  3.9    Filed Vitals:   11/22/14 1700 11/22/14 2135 11/23/14 0650 11/23/14 1407  BP: 162/87 158/80 167/83 178/94  Pulse: 97 94 103 102  Temp: 98.3 F (36.8 C) 98.5 F (36.9 C) 97.5 F (36.4 C) 97.4 F (36.3 C)  TempSrc: Oral Oral Oral Oral  Resp: 16 16 16 16   Height:      Weight:      SpO2: 100% 100% 100% 100%   Exam Frail, weak, fragile No rash, cyanosis or gangrene Sclera anicteric, throat clear No jvd Chest clear bilat RRR no MRG Abd ostomy in place, mild diffuse tenderness  2+  bilat LE edema Neuro is lethargic, arousable   Assessment: 1. Acute on CRF - probably her recently/ newer stents placed in August are now failing. She is not dry and BP's are not low.  2. 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 Metastatic carcinoid tumor - liver/ omental mets, large pelvic mass by last CT , disease has been progressive, onset 2009    Plan- best thing at this time I believe is transition to hospice care.  I don't think doing perc nephrostomy tubes is going to help her at this point. She is in severe pain from the cancer, daily nausea /vomiting at home is the norm.  Will d/w Dr Benay Spice in am, but primary concern I see is patient comfort. Have d/w two daughters at the bedside who did not disagree but don't want to make the decision to stop, understandably so. Tried to discuss with patient EOL issues also but she would not engage the subject at all.     Kelly Splinter MD (pgr) [phone]    [phone] 11/23/2014, 7:27 PM

## 2014-11-23 NOTE — Progress Notes (Signed)
Subjective:  1 - Refractory Malignant Ureteral Obstruction  - long h/o bilateral malignant hydro due to metastatic carcinoid. Recent had bilat neph tubes with Cr nadir around 2.0. Bilateral Metal stents placed 07/2014 with goal of removing neph tubes which were removed around 09/2014. Now Cr up again to >4 with stents in good position by [place].  Today "Jamas Lav" is stable but weak. Not tolerating and PO intake, just TPN only with continued G-tube output and some small emesis. NO flank pain.  Objective: Vital signs in last 24 hours: Temp:  [97.5 F (36.4 C)-98.5 F (36.9 C)] 97.5 F (36.4 C) (12/05 0650) Pulse Rate:  [94-103] 103 (12/05 0650) Resp:  [16] 16 (12/05 0650) BP: (138-167)/(76-87) 167/83 mmHg (12/05 0650) SpO2:  [100 %] 100 % (12/05 0650) Last BM Date: 11/21/14  Intake/Output from previous day: 12/04 0701 - 12/05 0700 In: 220 [I.V.:220] Out: 1005 [Urine:180; Drains:825] Intake/Output this shift:    General appearance: alert, cachectic and very ill-appearing, family at bedside.  Ears: negative external exam Nose: no discharge Back: symmetric, no curvature. ROM normal. No CVA tenderness. Resp: non-labored on room air Chest wall: no tenderness Cardio: Nl rate GI: G tube to drain with bilious output. No abd distension.. Pelvic: external genitalia normal Extremities: extremities normal, atraumatic, no cyanosis or edema Skin: Skin color, texture, turgor normal. No rashes or lesions Lymph nodes: Cervical, supraclavicular, and axillary nodes normal. Neurologic: Grossly normal  Lab Results:   Recent Labs  11/21/14 1539 11/22/14 0525  WBC 11.3* 10.9*  HGB 9.1* 9.2*  HCT 29.0* 29.6*  PLT 255 258   BMET  Recent Labs  11/22/14 1205 11/23/14 0520  NA 145 148*  K 5.2 5.5*  CL 117* 117*  CO2 16* 18*  GLUCOSE 35* 169*  BUN 96* 106*  CREATININE 3.92* 4.70*  CALCIUM 10.4 10.4   PT/INR No results for input(s): LABPROT, INR in the last 72 hours. ABG No results  for input(s): PHART, HCO3 in the last 72 hours.  Invalid input(s): PCO2, PO2  Studies/Results: US Renal  11/22/2014   CLINICAL DATA:  Acute renal failure, bilateral stance, history of hydronephrosis  EXAM: RENAL/URINARY TRACT ULTRASOUND COMPLETE  COMPARISON:  Abdominal ultrasound of May 29, 2014.  FINDINGS: Right Kidney:  Length: 9.6 cm. There is mild hydronephrosis. The cortical echotexture remains lower than that of the adjacent liver. Mild cortical thinning is suspected. The stent is visible.  Left Kidney:  Length: 11.7 cm. There is moderate hydronephrosis. The stent is visible. The renal cortical echotexture is grossly normal.  Bladder:  A Foley catheter as well as bilateral ureteral stents are demonstrated.  IMPRESSION: 1. There is mild hydronephrosis on the right and moderate hydronephrosis on the left. The stents are visible bilaterally. 2. The urinary bladder is unremarkable and contains the Foley catheter and the lower most portions of both stents.   Electronically Signed   By: David  Martinique   On: 11/22/2014 12:31   Dg Abd Portable 2v  11/21/2014   CLINICAL DATA:  Small bowel obstruction.  Abdominal pain, nausea.  EXAM: PORTABLE ABDOMEN - 2 VIEW  COMPARISON:  05/30/2014  FINDINGS: Bilateral ureteral stents are in place, stable. Gastrostomy tube projects over the distal stomach. Nonobstructive bowel gas pattern. No free air organomegaly. Visualized lung bases are clear. No acute bony abnormality.  IMPRESSION: Bilateral ureteral stents in stable position. No evidence of bowel obstruction or free air.   Electronically Signed   By: Rolm Baptise M.D.   On: 11/21/2014 16:19  Anti-infectives: Anti-infectives    Start     Dose/Rate Route Frequency Ordered Stop   11/22/14 1600  cefTRIAXone (ROCEPHIN) 1 g in dextrose 5 % 50 mL IVPB - Premix    Comments:  Send AFTER urine culture sent.   1 g100 mL/hr over 30 Minutes Intravenous Every 24 hours 11/22/14 1349        Assessment/Plan:  1 -  Refractory Malignant Ureteral Obstruction  - GFR w/o sig improvement despite hydration. Had very frank discussion with pt and family about overall goals of care. If they want to continue "maximally aggressive" path, would favor replacement bilateral neph tubes with goal of renal decompression as she is showing sings of persistent mild obstruction despite well positioned metallic stents. Also frankly discussed more palliative only approach as very reasonable given overall clinical status.   They will think about it.   Will follow.   [facility], Gerald Kuehl 11/23/2014

## 2014-11-23 NOTE — Progress Notes (Signed)
PARENTERAL NUTRITION CONSULT NOTE  Pharmacy Consult for TPN Indication: chronic intestinal obstruction secondary to metastatic carcinoid tumor  No Known Allergies  Patient Measurements: Height: 5' 3"  (160 cm) Weight: 119 lb 1.6 oz (54.023 kg) IBW/kg (Calculated) : 52.4   Vital Signs: Temp: 97.5 F (36.4 C) (12/05 0650) Temp Source: Oral (12/05 0650) BP: 167/83 mmHg (12/05 0650) Pulse Rate: 103 (12/05 0650) Intake/Output from previous day: 12/04 0701 - 12/05 0700 In: 220 [I.V.:220] Out: 1005 [Urine:180; Drains:825] Intake/Output from this shift: Total I/O In: -  Out: 125 [Urine:125]  Labs:  Recent Labs  11/21/14 1136 11/21/14 1539 11/22/14 0525  WBC 12.7* 11.3* 10.9*  HGB 10.2* 9.1* 9.2*  HCT 33.1* 29.0* 29.6*  PLT 266 255 258     Recent Labs  11/21/14 1137  11/22/14 0525 11/22/14 1205 11/22/14 1416 11/23/14 0520  NA 149*  --  151* 145  --  148*  K 5.6*  --  6.4* 5.2  --  5.5*  CL  --   --  120* 117*  --  117*  CO2 18*  --  18* 16*  --  18*  GLUCOSE 117  --  63* 35*  --  169*  BUN 95.4*  --  100* 96*  --  106*  CREATININE 3.5*  < > 4.00* 3.92*  --  4.70*  LABCREA  --   --   --   --  74.91  --   CALCIUM 12.2*  --  11.1* 10.4  --  10.4  MG  --   --  2.2  --   --  2.1  PHOS  --   --  4.5  --   --  3.9  PROT 6.5  --  6.3  --   --  6.0  ALBUMIN 2.0*  --  1.9*  --   --  1.7*  AST 42*  --  56*  --   --  59*  ALT 34  --  34  --   --  35  ALKPHOS 299*  --  277*  --   --  252*  BILITOT 0.50  --  0.4  --   --  0.2*  PREALBUMIN  --   --  15.9*  --   --   --   TRIG  --   --  105  --   --   --   < > = values in this interval not displayed.Corrected calcium = 13.8 Estimated Creatinine Clearance: 9.5 mL/min (by C-G formula based on Cr of 4.7).    Recent Labs  11/22/14 1844 11/23/14 0040 11/23/14 0644  GLUCAP 115* 133* 136*    Medical History: Past Medical History  Diagnosis Date  . Complication of anesthesia     slow to wake up  . Blood transfusion  without reported diagnosis 2009 and 2011  . Hypertension     off all bp meds since Dec 20, 2012  . Transfusion history     last transfused 05-31-14(2 units)  . Chronic kidney disease 12-19-2012    elevated bun and creatinine   . Portacath in place     right chest.  . Gastrostomy tube in place     palliative gastrostomy since 8'14 Mid abdomen "at waist level""gastric residual"  . Hx of nephrostomy     05-31-14 Left flank area to straight drainage continuosly, last changed 07-11-14. Void very little to none.  . Ileostomy in place     4'09 left abdomen  .  On total parenteral nutrition (TPN)     at home through portacath daily 2000 x 14 hours. 24/7  . Cancer 02/19/2008    Metastatic carcinoid tumor(appendix area origin)    Medications:  Scheduled:  . amLODipine  5 mg Per Tube Daily  . cefTRIAXone (ROCEPHIN)  IV  1 g Intravenous Q24H  . insulin aspart  0-9 Units Subcutaneous 4 times per day   Infusions:  . dextrose 5 % and 0.45% NaCl 40 mL/hr at 11/23/14 0050  . TPN (CLINIMIX) Adult without lytes 40 mL/hr at 11/22/14 1726   PRN: acetaminophen **OR** acetaminophen, antiseptic oral rinse, feeding supplement (RESOURCE BREEZE), HYDROmorphone (DILAUDID) injection, LORazepam, ondansetron, oxyCODONE, promethazine   Insulin Requirements:  2 units insulin used in past 24h (sensitive SSI)   Current Nutrition:  Clear liquids- eating 0% Clinimix 5/15 (without electrolytes) at 40 ml/hr  IVF:  D5W + 1/2 NS 40 mL/hr  Central access:  PICC TPN start date:   On chronic home TPN  ASSESSMENT                                                                                             HPI:  68 y/o F with chronic intestinal obstruction secondary to metastatic carcinoid tumor, has venting PEG and ileostomy, on home TPN since 01/2013, CKD with baseline SCr approximately 1.8, admitted 12/3 from Dr. Gearldine Shown office after she was seen for worsening abdominal pain, nausea with dry heaves.  Orders received  to continue TPN with pharmacy dosing assistance.   Significant events:  12/3:   See details of home TPN formula in note from Carolynn Sayers, RN from [place].  Pamidronate 90 mg IV x 1 ordered by MD for hypercalcemia.  Orders received from MD to hold TPN for tonight given azotemia and electrolyte abnormalities. 12/4: Start TPN without electrolytes, kayexelate given for hyperkalemia   Today:   Glucose - <150  Electrolytes - Na, Cl, K, Ca elevated  Renal - SCr elevated to approx 2x baseline and worsened  LFTs -  Alk phos and AST elevated (chronic)  TGs -   105 (12/4)  Prealbumin - 15.9 (12/4)  NUTRITIONAL GOALS                                                                                Home TPN regimen provides:  55 grams/protein/day and 1070 KCal/day on days without lipids (5 days/week)  55 grams protein/day and 1570 KCal/day on days with lipids (2 days/week)    Clinimix 5/15 @ 40 ml/hr will provide 682 kcal/d and 48 gm protein/d Clinimix 5/15 @ 40 ml/hr + 20% fat emulsion at 10 ml/hr will provide 1162 kcal/d and 48 gm protein/d   PLAN  1. At 1800: continue Clinimix 5/15 (without electrolytes) at 40 ml/hr. Will not add fat emulsion again today 2. Have family bring in home TPN formula for possible use once electrolytes and renal function have improved.  Spoke with patient's daughter - she will arrange this.  3. TPN to contain standard multivitamins and trace elements daily 4. BMet, Mag, Phos in AM. 5. Routine TPN labs Mondays and Thursdays. 6. Continue sensitive SSI and CBG checks q6h 7. Continue D5W 1/2NS at 40 ml/hr 8. Follow-up daily.  Thank you for the consult.  Currie Paris, PharmD, BCPS Pager: [phone] Pharmacy: [phone] 11/23/2014 9:19 AM

## 2014-11-23 NOTE — Progress Notes (Addendum)
Progress Note   Lori Dennis XTG:[id number] DOB: [birth year] DOA: 11/21/2014 PCP: Irven Shelling, MD   Brief Narrative:   Lori Dennis is an 68 y.o. female with a PMH of metastatic carcinoid with peritoneal metastasis, bilateral hydronephrosis status post bilateral metal stents, chronic intestinal obstruction status post venting PEG and ileostomy on TNA since 01/2013, CKD with baseline creatinine around 1.8, who was referred as a direct admission from Dr. Gearldine Shown office 11/21/14 after she was seen for worsening abdominal pain, nausea with dry heaves.Labs showed worsening renal function, hyperkalemia and hypercalcemia.  Assessment/Plan:   Principal Problem:  SBO (small bowel obstruction), chronic intermittent secondary to peritoneal carcinomatosis with nausea, vomiting and abdominal pain  PEG to suction PRN.  IV anti-emetics as needed: Phenergan and Zofran.  KUB ordered, no frank bowel obstruction noted.  Treat pain with IV Dilaudid.  Add Fentanyl patch.    Active Problems:   Left leg edema  Doppler negative.    Rigors  U/A concerning for UTI, started on empiric Rocephin 11/22/14.  F/U cultures.   Carcinoid tumor of intestine  Dr. Benay Spice following.  May need Hospice if her condition continues to deteriorate.   Dehydration / Hypernatremia   Changed IV fluids to hypotonic 11/22/14.   Anemia of chronic disease  Hemoglobin stable with no current indication for transfusion.   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  Renal ultrasound done 11/22/14 showed mild hydronephrosis on the right and moderate on the left with the stents visible bilaterally. Seen by urologist with no current recommendations for stent change.  FeNa is 1.15%.  Will ask nephrologist to evaluate, but I believe she likely has obstructive uropathy  in the setting of CKD.   Hypercalcemia  Given pamidronate 11/21/14.   Hyperkalemia  Given 30 g of Kayexalate, 1 amp of D50 and 10 units of insulin 11/22/14.   DVT prophylaxis  Lovenox ordered.  Code Status: DNR Family Communication: Daughter Lori Dennis [phone], Wynetta Emery [phone] updated at bedside. Disposition Plan: Home when stable.   IV Access:    PICC   Procedures and diagnostic studies:   Dg Abd Portable 2v 11/21/2014: Bilateral ureteral stents in stable position. No evidence of bowel obstruction or free air.      Medical Consultants:    Dr. Julieanne Manson, Oncology.  Dr. Bjorn Loser, Urology  Dr. Roney Jaffe, Nephrology  Anti-Infectives:    None.  Subjective:   Lori Dennis continues to have lower abdominal pain and nausea with dry heaves at times.  She is lethargic and disoriented at times.  She tells me she is "tired".    Objective:    Filed Vitals:   11/22/14 1213 11/22/14 1700 11/22/14 2135 11/23/14 0650  BP: 138/76 162/87 158/80 167/83  Pulse:  97 94 103  Temp:  98.3 F (36.8 C) 98.5 F (36.9 C) 97.5 F (36.4 C)  TempSrc:  Oral Oral Oral  Resp:  16 16 16   Height:      Weight:      SpO2:  100% 100% 100%    Intake/Output Summary (Last 24 hours) at 11/23/14 6789 Last data filed at 11/23/14 0417  Gross per 24 hour  Intake    220 ml  Output   1005 ml  Net   -785 ml    Exam: Gen:  Uncomfortable, lethargic, weak Cardiovascular:  Tachy/reg, No M/R/G Respiratory:  Lungs CTAB Gastrointestinal:  Abdomen firm lower quadrants, + BS Extremities:  Marked assymetric edema on left   Data Reviewed:    Labs: Basic Metabolic Panel:  Recent Labs Lab 11/21/14 1137 11/21/14 1539  11/22/14 0525 11/22/14 1205 11/23/14 0520  NA 149*  --   --  151* 145 148*  K 5.6*  --   < > 6.4* 5.2 5.5*  CL  --   --   --  120* 117* 117*  CO2 18*  --   --  18* 16* 18*  GLUCOSE 117  --   --  63* 35* 169*  BUN 95.4*  --    --  100* 96* 106*  CREATININE 3.5* 3.21*  --  4.00* 3.92* 4.70*  CALCIUM 12.2*  --   --  11.1* 10.4 10.4  MG  --   --   --  2.2  --  2.1  PHOS  --   --   --  4.5  --  3.9  < > = values in this interval not displayed. GFR Estimated Creatinine Clearance: 9.5 mL/min (by C-G formula based on Cr of 4.7). Liver Function Tests:  Recent Labs Lab 11/21/14 1137 11/22/14 0525 11/23/14 0520  AST 42* 56* 59*  ALT 34 34 35  ALKPHOS 299* 277* 252*  BILITOT 0.50 0.4 0.2*  PROT 6.5 6.3 6.0  ALBUMIN 2.0* 1.9* 1.7*    CBC:  Recent Labs Lab 11/21/14 1136 11/21/14 1539 11/22/14 0525  WBC 12.7* 11.3* 10.9*  NEUTROABS 11.3*  --  9.0*  HGB 10.2* 9.1* 9.2*  HCT 33.1* 29.0* 29.6*  MCV 98.5 97.6 98.0  PLT 266 255 258   CBG:  Recent Labs Lab 11/22/14 1311 11/22/14 1337 11/22/14 1844 11/23/14 0040 11/23/14 0644  GLUCAP 56* 126* 115* 133* 136*   Lipid Profile:  Recent Labs  11/22/14 0525  TRIG 105   Microbiology No results found for this or any previous visit (from the past 240 hour(s)).   Medications:   . amLODipine  5 mg Per Tube Daily  . cefTRIAXone (ROCEPHIN)  IV  1 g Intravenous Q24H  . insulin aspart  0-9 Units Subcutaneous 4 times per day   Continuous Infusions: . dextrose 5 % and 0.45% NaCl 40 mL/hr at 11/23/14 0050  . TPN (CLINIMIX) Adult without lytes 40 mL/hr at 11/22/14 1726    Time spent: 35 minutes with > 50% of time discussing current diagnostic test results, clinical impression and plan of care.    LOS: 2 days   Terrall Bley  [facility] Pager [phone]. If unable to reach me by pager, please call my cell phone at [phone].  *Please refer to [email or web address], password [email or web address] to get updated schedule on who will round on this patient, as hospitalists switch teams weekly. If 7PM-7AM, please contact night-coverage at [email or web address], password [email or web address] for any overnight needs.  11/23/2014, 8:06 AM

## 2014-11-24 DIAGNOSIS — R112 Nausea with vomiting, unspecified: Secondary | ICD-10-CM

## 2014-11-24 LAB — GLUCOSE, CAPILLARY
GLUCOSE-CAPILLARY: 155 mg/dL — AB (ref 70–99)
Glucose-Capillary: 130 mg/dL — ABNORMAL HIGH (ref 70–99)
Glucose-Capillary: 162 mg/dL — ABNORMAL HIGH (ref 70–99)
Glucose-Capillary: 97 mg/dL (ref 70–99)

## 2014-11-24 LAB — URINE CULTURE
Colony Count: 95000
SPECIAL REQUESTS: NORMAL

## 2014-11-24 LAB — BASIC METABOLIC PANEL
ANION GAP: 15 (ref 5–15)
BUN: 111 mg/dL — ABNORMAL HIGH (ref 6–23)
CO2: 17 meq/L — AB (ref 19–32)
Calcium: 10.1 mg/dL (ref 8.4–10.5)
Chloride: 113 mEq/L — ABNORMAL HIGH (ref 96–112)
Creatinine, Ser: 5.05 mg/dL — ABNORMAL HIGH (ref 0.50–1.10)
GFR calc Af Amer: 9 mL/min — ABNORMAL LOW (ref >90)
GFR calc non Af Amer: 8 mL/min — ABNORMAL LOW (ref >90)
Glucose, Bld: 182 mg/dL — ABNORMAL HIGH (ref 70–99)
Potassium: 4.8 mEq/L (ref 3.7–5.3)
SODIUM: 145 meq/L (ref 137–147)

## 2014-11-24 LAB — PHOSPHORUS: Phosphorus: 3.2 mg/dL (ref 2.3–4.6)

## 2014-11-24 LAB — MAGNESIUM: MAGNESIUM: 2.1 mg/dL (ref 1.5–2.5)

## 2014-11-24 MED ORDER — TRACE MINERALS CR-CU-F-FE-I-MN-MO-SE-ZN IV SOLN
INTRAVENOUS | Status: DC
  Filled 2014-11-24: qty 1000

## 2014-11-24 MED ORDER — SODIUM CHLORIDE 0.9 % IV SOLN
INTRAVENOUS | Status: DC
  Administered 2014-11-24: 16:00:00 via INTRAVENOUS
  Administered 2014-11-25: 1000 mL via INTRAVENOUS
  Administered 2014-11-26: 03:00:00 via INTRAVENOUS
  Administered 2014-11-28: 1000 mL via INTRAVENOUS

## 2014-11-24 MED ORDER — HYDROMORPHONE BOLUS VIA INFUSION
1.0000 mg | INTRAVENOUS | Status: DC | PRN
  Administered 2014-11-24: 1 mg via INTRAVENOUS
  Administered 2014-11-28: 1.5 mg via INTRAVENOUS
  Filled 2014-11-24 (×3): qty 2

## 2014-11-24 MED ORDER — LORAZEPAM 2 MG/ML IJ SOLN: 0.5000 mg | INTRAMUSCULAR | Status: DC | PRN

## 2014-11-24 MED ORDER — DEXTROSE 10 % IV SOLN
INTRAVENOUS | Status: DC
  Administered 2014-11-24: 14:00:00 via INTRAVENOUS
  Filled 2014-11-24: qty 1000

## 2014-11-24 MED ORDER — SCOPOLAMINE 1 MG/3DAYS TD PT72
1.0000 | MEDICATED_PATCH | TRANSDERMAL | Status: DC
  Administered 2014-11-24: 1.5 mg via TRANSDERMAL
  Filled 2014-11-24: qty 1

## 2014-11-24 MED ORDER — PROMETHAZINE HCL 25 MG/ML IJ SOLN
12.5000 mg | Freq: Three times a day (TID) | INTRAMUSCULAR | Status: DC
  Administered 2014-11-24 – 2014-11-25 (×3): 12.5 mg via INTRAVENOUS
  Filled 2014-11-24 (×6): qty 1

## 2014-11-24 MED ORDER — SODIUM CHLORIDE 0.9 % IV SOLN
0.5000 mg/h | INTRAVENOUS | Status: DC
  Administered 2014-11-24 – 2014-11-28 (×2): 0.5 mg/h via INTRAVENOUS
  Filled 2014-11-24 (×2): qty 5

## 2014-11-24 NOTE — Progress Notes (Signed)
UR Completed.  Avacyn Kloosterman Jane [phone]  

## 2014-11-24 NOTE — Consult Note (Signed)
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 female with metastatic carcinoid cancer. She had been living independently up until this hospitalizations but has taken a dramatic decline in teh past week. She has two daughters who are devoted and are at bedside- a decision has been made to pursue full comfort care.  Participants in Discussion: 2 Daughters   Advance Directive: no   Code Status Orders        Start     Ordered   11/21/14 1540  Do not attempt resuscitation (DNR)   Continuous    Question Answer Comment  In the event of cardiac or respiratory ARREST Do not call a "code blue"   In the event of cardiac or respiratory ARREST Do not perform Intubation, CPR, defibrillation or ACLS   In the event of cardiac or respiratory ARREST Use medication by any route, position, wound care, and other measures to relive pain and suffering. May use oxygen, suction and manual treatment of airway obstruction as needed for comfort.      11/21/14 1543    Advance Directive Documentation        Most Recent Value   Type of Advance Directive  Healthcare Power of Attorney, Living will   Pre-existing out of facility DNR order (yellow form or pink MOST form)     "MOST" Form in Place?        I have reviewed the medical record, interviewed the patient and family, and examined  the patient. The following aspects are pertinent.  Past Medical History  Diagnosis Date  . Complication of anesthesia     slow to wake up  . Blood transfusion without reported diagnosis 2009 and 2011  . Hypertension     off all bp meds since Dec 20, 2012  . Transfusion history     last transfused 05-31-14(2 units)  . Chronic kidney disease 12-19-2012    elevated bun and creatinine   . Portacath in place     right chest.  . Gastrostomy tube in place     palliative gastrostomy since 8'14 Mid abdomen "at waist level""gastric residual"  . Hx of nephrostomy     05-31-14 Left flank area to straight drainage continuosly, last changed 07-11-14. Void very little to none.  . Ileostomy in place     4'09 left abdomen  . On total parenteral nutrition (TPN)     at home through portacath daily 2000 x 14 hours. 24/7  . Cancer 02/19/2008    Metastatic carcinoid tumor(appendix area origin)   History   Social History  . Marital Status: Divorced    Spouse Name: N/A    Number of Children: 3  . Years of Education: N/A   Occupational History  . Retired Pharmacist, hospital    Social History Main Topics  . Smoking status: Never Smoker   . Smokeless  tobacco: Never Used  . Alcohol Use: No  . Drug Use: No  . Sexual Activity: No   Other Topics Concern  . None   Social History Narrative   Divorced.  Lives alone.  Ambulates independently at baseline.   Family History  Problem Relation Age of Onset  . Heart disease Mother   . Cancer Sister     ovarian   Scheduled Meds: . promethazine  12.5 mg Intravenous 3 times per day  . scopolamine  1 patch Transdermal Q72H   Continuous Infusions: . sodium chloride    . HYDROmorphone 0.5 mg/hr (11/24/14 1410)   PRN Meds:.acetaminophen **OR** acetaminophen, antiseptic oral rinse, feeding supplement (RESOURCE BREEZE), HYDROmorphone, LORazepam, ondansetron No Known Allergies CBC:    Component Value Date/Time   WBC 10.9* 11/22/2014 0525   WBC 12.7* 11/21/2014 1136    HGB 9.2* 11/22/2014 0525   HGB 10.2* 11/21/2014 1136   HCT 29.6* 11/22/2014 0525   HCT 33.1* 11/21/2014 1136   PLT 258 11/22/2014 0525   PLT 266 11/21/2014 1136   MCV 98.0 11/22/2014 0525   MCV 98.5 11/21/2014 1136   NEUTROABS 9.0* 11/22/2014 0525   NEUTROABS 11.3* 11/21/2014 1136   LYMPHSABS 1.1 11/22/2014 0525   LYMPHSABS 0.7* 11/21/2014 1136   MONOABS 0.8 11/22/2014 0525   MONOABS 0.7 11/21/2014 1136   EOSABS 0.0 11/22/2014 0525   EOSABS 0.0 11/21/2014 1136   BASOSABS 0.0 11/22/2014 0525   BASOSABS 0.0 11/21/2014 1136   Comprehensive Metabolic Panel:    Component Value Date/Time   NA 145 11/24/2014 0545   NA 149* 11/21/2014 1137   NA 144 06/26/2012 1401   K 4.8 11/24/2014 0545   K 5.6* 11/21/2014 1137   K 5.1* 06/26/2012 1401   CL 113* 11/24/2014 0545   CL 92* 01/26/2013 1322   CL 97* 06/26/2012 1401   CO2 17* 11/24/2014 0545   CO2 18* 11/21/2014 1137   CO2 29 06/26/2012 1401   BUN 111* 11/24/2014 0545   BUN 95.4* 11/21/2014 1137   BUN 20 06/26/2012 1401   CREATININE 5.05* 11/24/2014 0545   CREATININE 3.5* 11/21/2014 1137   CREATININE 1.0 06/26/2012 1401   GLUCOSE 182* 11/24/2014 0545   GLUCOSE 117 11/21/2014 1137   GLUCOSE 156* 01/26/2013 1322   GLUCOSE 131* 06/26/2012 1401   CALCIUM 10.1 11/24/2014 0545   CALCIUM 12.2* 11/21/2014 1137   CALCIUM 8.2 06/26/2012 1401   AST 59* 11/23/2014 0520   AST 42* 11/21/2014 1137   ALT 35 11/23/2014 0520   ALT 34 11/21/2014 1137   ALKPHOS 252* 11/23/2014 0520   ALKPHOS 299* 11/21/2014 1137   BILITOT 0.2* 11/23/2014 0520   BILITOT 0.50 11/21/2014 1137   PROT 6.0 11/23/2014 0520   PROT 6.5 11/21/2014 1137   ALBUMIN 1.7* 11/23/2014 0520   ALBUMIN 2.0* 11/21/2014 1137    Vital Signs: BP 177/99 mmHg  Pulse 107  Temp(Src) 98 F (36.7 C) (Axillary)  Resp 16  Ht 5\' 3"  (1.6 m)  Wt 54.023 kg (119 lb 1.6 oz)  BMI 21.10 kg/m2  SpO2 99% Filed Weights   11/21/14 1449  Weight: 54.023 kg (119 lb 1.6 oz)   12/05  0701 - 12/06 0700 In: -  Out: 1300 [Urine:600; Drains:700]  Physical Exam:  Frail, abdomen distended, PEG tube, pale, minimally responsive  Summary of Established Goals of Care and Medical Treatment Preferences  1. DNR 2. Full comfort care  [name] is actively dying. Her creatine has increased to 4 and she is  confused and uremic. Additionally she has been having intractable NV and was dehydrated. She has been on TPN for >2 years and fought her cancer for 6 years while maintaining a relatively high QOL as well as maintained her independence.  Her daughters are grieving- they are all scared and never been through anything like this before. One of her daughters has a two month old at home and a 68 year old.   We talked about preferred EOL environment- I think given the prognosis and SM needs as well as family needs (young children especially that a hospice facility would be a good option) She may also decline quickly and be more appropriate for GIP Hospice care -I strongly feel this family needs the whole hospice team- there has been no pre-planning or preparation for her death-and young grandchildren are involved-grief is acute.  Prognosis: days   Palliative Performance Scale: 10%  Recommendations: 1. Nausea: scheduled Phenergan, low intermittent suction of venting PEG 2. Pain: Dilaudid infusion 3. Secretions: scopalamine  Full comfort care.   3PM-4:10PM Time Total: 50 minutes Greater than 50%  of this time was spent counseling and coordinating care related to the above assessment and plan.  Signed by: Roma Schanz, DO  11/24/2014, 3:53 PM  Please contact Palliative Medicine Team phone at [phone] for questions and concerns.

## 2014-11-24 NOTE — Progress Notes (Signed)
Above noted, transitioning to comfort care. Pall care consult pending. No other suggestions, will sign off.   Kelly Splinter MD (pgr) [phone]    [phone] 11/24/2014, 2:19 PM

## 2014-11-24 NOTE — Progress Notes (Signed)
Subjective:  1 - Refractory Malignant Ureteral Obstruction  - long h/o bilateral malignant hydro due to metastatic carcinoid. Recent had bilat neph tubes with Cr nadir around 2.0. Bilateral Metal stents placed 07/2014 with goal of removing neph tubes which were removed around 09/2014. Now Cr up again to >4 with stents in good position by [place].  Today "Lori Dennis" is stable but very weak. Requiring pain meds round the clock. GFR continues to slowly worsen w/o sig hyperkalemia.   Objective: Vital signs in last 24 hours: Temp:  [97.4 F (36.3 C)-98.4 F (36.9 C)] 98.4 F (36.9 C) (12/06 0602) Pulse Rate:  [102-110] 110 (12/06 0602) Resp:  [14-16] 16 (12/06 0602) BP: (153-178)/(84-96) 168/96 mmHg (12/06 0602) SpO2:  [97 %-100 %] 99 % (12/06 0602) Last BM Date: 11/21/14  Intake/Output from previous day: 12/05 0701 - 12/06 0700 In: -  Out: 1300 [Urine:600; Drains:700] Intake/Output this shift:    Head: Normocephalic, without obvious abnormality, atraumatic, Very ill appearing, family at bedside Eyes: negative Nose: Nares normal. Septum midline. Mucosa normal. No drainage or sinus tenderness. Throat: lips, mucosa, and tongue normal; teeth and gums normal Neck: supple, symmetrical, trachea midline Cardio: mid regular tachycardia GI: G Tube in place with bilious efflus. Non-distended abdomen.  Pelvic: external genitalia normal and foley in place with clear yellow urine.  Extremities: extremities normal, atraumatic, no cyanosis or edema Lymph nodes: Cervical, supraclavicular, and axillary nodes normal. Neurologic: Mental status: AO x1  Lab Results:   Recent Labs  11/21/14 1539 11/22/14 0525  WBC 11.3* 10.9*  HGB 9.1* 9.2*  HCT 29.0* 29.6*  PLT 255 258   BMET  Recent Labs  11/23/14 0520 11/24/14 0545  NA 148* 145  K 5.5* 4.8  CL 117* 113*  CO2 18* 17*  GLUCOSE 169* 182*  BUN 106* 111*  CREATININE 4.70* 5.05*  CALCIUM 10.4 10.1   PT/INR No results for input(s):  LABPROT, INR in the last 72 hours. ABG No results for input(s): PHART, HCO3 in the last 72 hours.  Invalid input(s): PCO2, PO2  Studies/Results: US Renal  11/22/2014   CLINICAL DATA:  Acute renal failure, bilateral stance, history of hydronephrosis  EXAM: RENAL/URINARY TRACT ULTRASOUND COMPLETE  COMPARISON:  Abdominal ultrasound of May 29, 2014.  FINDINGS: Right Kidney:  Length: 9.6 cm. There is mild hydronephrosis. The cortical echotexture remains lower than that of the adjacent liver. Mild cortical thinning is suspected. The stent is visible.  Left Kidney:  Length: 11.7 cm. There is moderate hydronephrosis. The stent is visible. The renal cortical echotexture is grossly normal.  Bladder:  A Foley catheter as well as bilateral ureteral stents are demonstrated.  IMPRESSION: 1. There is mild hydronephrosis on the right and moderate hydronephrosis on the left. The stents are visible bilaterally. 2. The urinary bladder is unremarkable and contains the Foley catheter and the lower most portions of both stents.   Electronically Signed   By: David  Martinique   On: 11/22/2014 12:31    Anti-infectives: Anti-infectives    Start     Dose/Rate Route Frequency Ordered Stop   11/22/14 1600  cefTRIAXone (ROCEPHIN) 1 g in dextrose 5 % 50 mL IVPB - Premix    Comments:  Send AFTER urine culture sent.   1 g100 mL/hr over 30 Minutes Intravenous Every 24 hours 11/22/14 1349        Assessment/Plan:  1 - Refractory Malignant Ureteral Obstruction  - GFR w/o sig improvement despite hydration. Again re-iterated overall poor prognosis with or without  more procedural intervention. Should pt / family want maximally agressive therapy, would replace bilat neph tubes. If they chose more palliative approach, which I expressed as entirely reasonable, then would NOT perform any additional renal drainage.   Will follow.

## 2014-11-24 NOTE — Progress Notes (Addendum)
PARENTERAL NUTRITION CONSULT NOTE  Pharmacy Consult for TPN Indication: chronic intestinal obstruction secondary to metastatic carcinoid tumor  No Known Allergies  Patient Measurements: Height: _0  (160 cm) Weight: 119 lb 1.6 oz (54.023 kg) IBW/kg (Calculated) : 52.4   Vital Signs: Temp: 98.4 F (36.9 C) (12/06 0602) Temp Source: Axillary (12/06 0602) BP: 168/96 mmHg (12/06 0602) Pulse Rate: 110 (12/06 0602) Intake/Output from previous day: 12/05 0701 - 12/06 0700 In: -  Out: 1300 [Urine:600; Drains:700] Intake/Output from this shift:    Labs:  Recent Labs  11/21/14 1136 11/21/14 1539 11/22/14 0525  WBC 12.7* 11.3* 10.9*  HGB 10.2* 9.1* 9.2*  HCT 33.1* 29.0* 29.6*  PLT 266 255 258     Recent Labs  11/21/14 1137  11/22/14 0525 11/22/14 1205 11/22/14 1416 11/23/14 0520 11/24/14 0545  NA 149*  < > 151* 145  --  148* 145  K 5.6*  < > 6.4* 5.2  --  5.5* 4.8  CL  --   < > 120* 117*  --  117* 113*  CO2 18*  < > 18* 16*  --  18* 17*  GLUCOSE 117  < > 63* 35*  --  169* 182*  BUN 95.4*  < > 100* 96*  --  106* 111*  CREATININE 3.5*  < > 4.00* 3.92*  --  4.70* 5.05*  LABCREA  --   --   --   --  74.91  --   --   CALCIUM 12.2*  < > 11.1* 10.4  --  10.4 10.1  MG  --   --  2.2  --   --  2.1 2.1  PHOS  --   --  4.5  --   --  3.9 3.2  PROT 6.5  --  6.3  --   --  6.0  --   ALBUMIN 2.0*  --  1.9*  --   --  1.7*  --   AST 42*  --  56*  --   --  59*  --   ALT 34  --  34  --   --  35  --   ALKPHOS 299*  --  277*  --   --  252*  --   BILITOT 0.50  --  0.4  --   --  0.2*  --   PREALBUMIN  --   --  15.9*  --   --   --   --   TRIG  --   --  105  --   --   --   --   < > = values in this interval not displayed.Corrected calcium = 13.8 Estimated Creatinine Clearance: 8.8 mL/min (by C-G formula based on Cr of 5.05).    Recent Labs  11/23/14 1757 11/23/14 2327 11/24/14 0628  GLUCAP 131* 137* 162*    Medical History: Past Medical History  Diagnosis Date  .  Complication of anesthesia     slow to wake up  . Blood transfusion without reported diagnosis 2009 and 2011  . Hypertension     off all bp meds since Dec 20, 2012  . Transfusion history     last transfused 05-31-14(2 units)  . Chronic kidney disease 12-19-2012    elevated bun and creatinine   . Portacath in place     right chest.  . Gastrostomy tube in place     palliative gastrostomy since 8'14 Mid abdomen "at waist level""gastric residual"  . Hx of  nephrostomy     05-31-14 Left flank area to straight drainage continuosly, last changed 07-11-14. Void very little to none.  . Ileostomy in place     4'09 left abdomen  . On total parenteral nutrition (TPN)     at home through portacath daily 2000 x 14 hours. 24/7  . Cancer 02/19/2008    Metastatic carcinoid tumor(appendix area origin)    Medications:  Scheduled:  . amLODipine  5 mg Per Tube Daily  . cefTRIAXone (ROCEPHIN)  IV  1 g Intravenous Q24H  . fentaNYL  12.5 mcg Transdermal Q72H  . insulin aspart  0-9 Units Subcutaneous 4 times per day   Infusions:  . dextrose 5 % and 0.45% NaCl 40 mL/hr at 11/23/14 2344  . TPN (CLINIMIX) Adult without lytes 40 mL/hr at 11/23/14 1735   PRN: acetaminophen **OR** acetaminophen, antiseptic oral rinse, feeding supplement (RESOURCE BREEZE), HYDROmorphone (DILAUDID) injection, LORazepam, ondansetron, oxyCODONE, promethazine   Insulin Requirements:  2 units insulin used in past 24h (sensitive SSI)   Current Nutrition:  Clear liquids- eating 0% Clinimix 5/15 (without electrolytes) at 40 ml/hr  IVF:  D5W + 1/2 NS 40 mL/hr  Central access:  PICC TPN start date:   On chronic home TPN  ASSESSMENT                                                                                             HPI:  68 y/o F with chronic intestinal obstruction secondary to metastatic carcinoid tumor, has venting PEG and ileostomy, on home TPN since 01/2013, CKD with baseline SCr approximately 1.8, admitted 12/3 from  Dr. Gearldine Shown office after she was seen for worsening abdominal pain, nausea with dry heaves.  Orders received to continue TPN with pharmacy dosing assistance.   Significant events:  12/3:   See details of home TPN formula in note from Carolynn Sayers, RN from [facility].  Pamidronate 90 mg IV x 1 ordered by MD for hypercalcemia.  Orders received from MD to hold TPN for tonight given azotemia and electrolyte abnormalities. 12/4: Start TPN without electrolytes, kayexelate given for hyperkalemia 12/6: renal function continues to worsen, Renal does not recommend nephrostomy tubes, does recommend transition to hospicecare   Today:   Glucose - CBGs acceptable  Electrolytes - Na, K improved to WNL; Cl, Ca remain elevated  Renal - SCr continues to worsen  LFTs -  Alk phos and AST elevated (chronic)  TGs -   105 (12/4)  Prealbumin - 15.9 (12/4)  NUTRITIONAL GOALS                                                                                Home TPN regimen provides:  55 grams/protein/day and 1070 KCal/day on days without lipids (5 days/week)  55 grams protein/day and 1570 KCal/day on days  with lipids (2 days/week)    Clinimix 5/15 @ 40 ml/hr will provide 682 kcal/d and 48 gm protein/d Clinimix 5/15 @ 40 ml/hr + 20% fat emulsion at 10 ml/hr will provide 1162 kcal/d and 48 gm protein/d   PLAN                                                                                                              At 1800: continue Clinimix 5/15 (without electrolytes) at 40 ml/hr. Will not add fat emulsion again today  Have family bring in home TPN formula for possible use once electrolytes and renal function have improved.  Spoke with patient's daughter - she will arrange this.   TPN to contain standard multivitamins and trace elements daily  Routine TPN labs Mondays and Thursdays.  Continue sensitive SSI and CBG checks q6h  Continue D5W 1/2NS at 40 ml/hr  Follow-up  daily.  Thank you for the consult.  Currie Paris, PharmD, BCPS Pager: [phone] Pharmacy: [phone] 11/24/2014 9:01 AM    Addendum: TPN per Pharmacy cancelled by MD. Noted TPN labs also discontinued. Pt likely to transition to comfort care.  Plan: - Pharmacy will sign off, please re-consult if needed  Currie Paris, PharmD, BCPS Pager: [phone] Pharmacy: [phone] 11/24/2014 1:56 PM

## 2014-11-24 NOTE — Progress Notes (Addendum)
Progress Note   Lori Dennis:[id number] DOB: 1946/01/23 DOA: 11/21/2014 PCP: Irven Shelling, MD   Brief Narrative:   Lori Dennis is an 68 y.o. female with a PMH of metastatic carcinoid with peritoneal metastasis, bilateral hydronephrosis status post bilateral metal stents, chronic intestinal obstruction status post venting PEG and ileostomy on TNA since 01/2013, CKD with baseline creatinine around 1.8, who was referred as a direct admission from Dr. Gearldine Shown office 11/21/14 after she was seen for worsening abdominal pain, nausea with dry heaves.Labs showed worsening renal function, hyperkalemia and hypercalcemia.  Assessment/Plan:   Principal Problem:  SBO (small bowel obstruction), chronic intermittent secondary to peritoneal carcinomatosis with nausea, vomiting and abdominal pain  PEG to gravity to decompress stomach.  IV anti-emetics as needed: Phenergan and Zofran.  KUB ordered, no frank bowel obstruction noted.   After lengthy discussion with family, will move to full comfort care.  Place on dilaudid drip.  I suspect she will have an in-hospital death, and will decline quickly off TNA with progressive renal failure.  Active Problems:   Left leg edema  Doppler negative.    Rigors  U/A concerning for UTI, started on empiric Rocephin 11/22/14.  Culture polymicrobial.  D/C Rocephin.   Carcinoid tumor of intestine  Dr. Benay Spice following.  Terminal now with focus of care now comfort.  Have asked palliative care to see for assistance with nausea control.   Dehydration / Hypernatremia   Changed IV fluids to hypotonic 11/22/14.   Anemia of chronic disease  Hemoglobin stable with no current indication for transfusion.   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  Renal ultrasound done 11/22/14 showed mild  hydronephrosis on the right and moderate on the left with the stents visible bilaterally. Seen by urologist with no current recommendations for stent change.  FeNa is 1.15%.  S/P evaluation by nephrology, no recovery of renal function with etiology likely due to obstruction.   Hypercalcemia  Given pamidronate 11/21/14.   Hyperkalemia  Given 30 g of Kayexalate, 1 amp of D50 and 10 units of insulin 11/22/14.   DVT prophylaxis  Lovenox ordered.  D/C in light of comfort care.  Code Status: DNR Family Communication: Daughter Lori Dennis [phone], Lori Dennis [phone] updated at bedside. Disposition Plan: Home with Hospice when stable.   IV Access:    PICC   Procedures and diagnostic studies:   Dg Abd Portable 2v 11/21/2014: Bilateral ureteral stents in stable position. No evidence of bowel obstruction or free air.      Medical Consultants:    Dr. Julieanne Manson, Oncology.  Dr. Bjorn Loser, Urology  Dr. Roney Jaffe, Nephrology  Anti-Infectives:    None.  Subjective:   Lori Dennis continues to have lower abdominal pain, sleeping more, with ongoing nausea with dry heaves with occasional vomiting up of green bile.  She is lethargic and disoriented at times.    Objective:    Filed Vitals:   11/23/14 0650 11/23/14 1407 11/23/14 2123 11/24/14 0602  BP: 167/83 178/94 153/84 168/96  Pulse: 103 102 109 110  Temp: 97.5 F (36.4 C) 97.4 F (36.3 C) 98.3 F (36.8 C) 98.4 F (36.9 C)  TempSrc: Oral Oral Axillary Axillary  Resp: 16 16 14 16   Height:      Weight:      SpO2: 100% 100% 97% 99%    Intake/Output Summary (Last  24 hours) at 11/24/14 0813 Last data filed at 11/24/14 0602  Gross per 24 hour  Intake      0 ml  Output   1300 ml  Net  -1300 ml    Exam: Gen:  Uncomfortable, lethargic, weak Cardiovascular:  Tachy/reg, No M/R/G Respiratory:  Lungs CTAB Gastrointestinal:  Abdomen firm lower quadrants, + BS Extremities:   Marked assymetric edema on left   Data Reviewed:    Labs: Basic Metabolic Panel:  Recent Labs Lab 11/21/14 1137 11/21/14 1539  11/22/14 0525 11/22/14 1205 11/23/14 0520 11/24/14 0545  NA 149*  --   --  151* 145 148* 145  K 5.6*  --   < > 6.4* 5.2 5.5* 4.8  CL  --   --   --  120* 117* 117* 113*  CO2 18*  --   --  18* 16* 18* 17*  GLUCOSE 117  --   --  63* 35* 169* 182*  BUN 95.4*  --   --  100* 96* 106* 111*  CREATININE 3.5* 3.21*  --  4.00* 3.92* 4.70* 5.05*  CALCIUM 12.2*  --   --  11.1* 10.4 10.4 10.1  MG  --   --   --  2.2  --  2.1 2.1  PHOS  --   --   --  4.5  --  3.9 3.2  < > = values in this interval not displayed. GFR Estimated Creatinine Clearance: 8.8 mL/min (by C-G formula based on Cr of 5.05). Liver Function Tests:  Recent Labs Lab 11/21/14 1137 11/22/14 0525 11/23/14 0520  AST 42* 56* 59*  ALT 34 34 35  ALKPHOS 299* 277* 252*  BILITOT 0.50 0.4 0.2*  PROT 6.5 6.3 6.0  ALBUMIN 2.0* 1.9* 1.7*    CBC:  Recent Labs Lab 11/21/14 1136 11/21/14 1539 11/22/14 0525  WBC 12.7* 11.3* 10.9*  NEUTROABS 11.3*  --  9.0*  HGB 10.2* 9.1* 9.2*  HCT 33.1* 29.0* 29.6*  MCV 98.5 97.6 98.0  PLT 266 255 258   CBG:  Recent Labs Lab 11/23/14 0644 11/23/14 1157 11/23/14 1757 11/23/14 2327 11/24/14 0628  GLUCAP 136* 155* 131* 137* 162*   Lipid Profile:  Recent Labs  11/22/14 0525  TRIG 105   Microbiology Recent Results (from the past 240 hour(s))  Culture, Urine     Status: None   Collection Time: 11/22/14  2:16 PM  Result Value Ref Range Status   Specimen Description URINE, CATHETERIZED  Final   Special Requests Normal  Final   Culture  Setup Time   Final    11/22/2014 22:51 Performed at [facility]   Final    95,000 COLONIES/ML Performed at [facility]    Culture   Final    Multiple bacterial morphotypes present, none predominant. Suggest appropriate recollection if clinically indicated. Performed at  [facility]    Report Status 11/24/2014 FINAL  Final     Medications:   . amLODipine  5 mg Per Tube Daily  . cefTRIAXone (ROCEPHIN)  IV  1 g Intravenous Q24H  . fentaNYL  12.5 mcg Transdermal Q72H  . insulin aspart  0-9 Units Subcutaneous 4 times per day   Continuous Infusions: . dextrose 5 % and 0.45% NaCl 40 mL/hr at 11/23/14 2344  . TPN (CLINIMIX) Adult without lytes 40 mL/hr at 11/23/14 1735    Time spent: 35 minutes with > 50% of time discussing current diagnostic test results, clinical  impression and plan of care.    LOS: 3 days   [facility] Pager [phone]. If unable to reach me by pager, please call my cell phone at [phone].  *Please refer to [email or web address], password [email or web address] to get updated schedule on who will round on this patient, as hospitalists switch teams weekly. If 7PM-7AM, please contact night-coverage at [email or web address], password [email or web address] for any overnight needs.  11/24/2014, 8:13 AM

## 2014-11-25 ENCOUNTER — Other Ambulatory Visit: Payer: Self-pay | Admitting: Oncology

## 2014-11-25 DIAGNOSIS — R1084 Generalized abdominal pain: Secondary | ICD-10-CM

## 2014-11-25 DIAGNOSIS — Z515 Encounter for palliative care: Secondary | ICD-10-CM

## 2014-11-25 MED ORDER — PROMETHAZINE HCL 25 MG/ML IJ SOLN: 6.2500 mg | Freq: Three times a day (TID) | INTRAMUSCULAR | Status: DC | PRN

## 2014-11-25 MED ORDER — SODIUM CHLORIDE 0.9 % IV SOLN
50.0000 ug/h | INTRAVENOUS | Status: DC
  Administered 2014-11-25: 25 ug/h via INTRAVENOUS
  Administered 2014-11-27: 50 ug/h via INTRAVENOUS
  Filled 2014-11-25 (×6): qty 1

## 2014-11-25 NOTE — Progress Notes (Signed)
Patient ID: Lori Dennis, female   DOB: 08-04-1946, 68 y.o.   MRN: [id number] [facility] PROGRESS NOTE  Lori Dennis XLK:[id number] DOB: 1946-09-22 DOA: 11/21/2014 PCP: Irven Shelling, MD  Brief narrative:    68 y.o. female with a PMH of metastatic carcinoid with peritoneal metastasis, bilateral hydronephrosis status post bilateral metal stents, chronic intestinal obstruction status post venting PEG and ileostomy on TNA since 01/2013, CKD with baseline creatinine around 1.8, who was referred as a direct admission from Dr. Gearldine Shown office 11/21/14 after she was seen for worsening abdominal pain, nausea with dry heaves.Labs showed worsening renal function, hyperkalemia and hypercalcemia. Progressive decline in hospital and decision made by family to focus on comfort. Appreciate palliative care assisting management.  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 Per family request we changed from low intermittent suction to briefly continuous suction. There was about 85 mL coming out immediately whereas not much output noted during low intermittent suction.  Patient still with residual nausea and vomiting. Will use antiemetics as needed.  Focuses on comfort care. Continue Dilaudid drip.  Appreciate palliative care following.  Active Problems:  Left leg edema  Doppler negative.   Possible UTI  U/A concerning for UTI, started on empiric Rocephin 11/22/14. Culture polymicrobial so Rocephin was subsequently discontinued.   Carcinoid tumor of intestine  Per family wishes, focuses on comfort care. Patient was started on Dilaudid drip for pain related to cancer.  Supportive care for nausea and vomiting.   Dehydration / Hypernatremia  Supportive care.   Anemia of chronic disease  Hemoglobin  stable with no current indication for transfusion.  No further blood work to focus on comfort.   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  Renal ultrasound done 11/22/14 showed mild hydronephrosis on the right and moderate on the left with the stents visible bilaterally. Seen by urologist with no current recommendations for stent change.  FeNa is 1.15%.  S/P evaluation by nephrology, no recovery of renal function with etiology likely due to obstruction.   Hypercalcemia  Given pamidronate 11/21/14.   Hyperkalemia  Given 30 g of Kayexalate, 1 amp of D50 and 10 units of insulin 11/22/14.  No further blood work since focus is now comfort care.   DVT prophylaxis  Lovenox ordered. Lovenox was stopped to ensure comfort care.  Code Status: DNR Family Communication: Daughter Leone Brand [phone], Wynetta Emery [phone]. Family updated at the bedside during the rounds. Disposition Plan: Remains inpatient. Will follow-up with palliative care if patient can move to residential hospice.   IV access:   PICC  Procedures and diagnostic studies:    US Renal 11/22/2014   1. There is mild hydronephrosis on the right and moderate hydronephrosis on the left. The stents are visible bilaterally. 2. The urinary bladder is unremarkable and contains the Foley catheter and the lower most portions of both stents.   Electronically Signed   By: David  Martinique   On: 11/22/2014 12:31   Dg Abd Portable 2v 11/21/2014   Bilateral ureteral stents in stable position. No evidence of bowel obstruction or free air.   Electronically Signed   By: Rolm Baptise M.D.   On: 11/21/2014 16:19   Medical Consultants:   Dr. Julieanne Manson, Oncology.  Dr.  Bjorn Loser, Urology  Dr. Roney Jaffe, Nephrology  Dr. Rhea Pink, Palliative care   Other Consultants:    None   IAnti-Infectives:    None    Leisa Lenz, MD  [facility] Pager [phone]  If 7PM-7AM, please contact night-coverage [email or web address] Password [email or web address] 11/25/2014, 11:25 AM   LOS: 4 days    HPI/Subjective: No acute overnight events.  Objective: Filed Vitals:   11/24/14 0602 11/24/14 1459 11/24/14 2107 11/25/14 0610  BP: 168/96 177/99 150/89 140/75  Pulse: 110 107 102 97  Temp: 98.4 F (36.9 C) 98 F (36.7 C) 97.9 F (36.6 C) 97.5 F (36.4 C)  TempSrc: Axillary Axillary Axillary Axillary  Resp: 16 16 14 12   Height:      Weight:      SpO2: 99% 99% 100% 97%    Intake/Output Summary (Last 24 hours) at 11/25/14 1125 Last data filed at 11/25/14 1100  Gross per 24 hour  Intake 207.67 ml  Output    900 ml  Net -692.33 ml    Exam:   General:  Pt is alert,  not in acute distress, cachetic   Cardiovascular: Regular rate and rhythm, S1/S2 appreciated   Respiratory: no wheezing, no crackles, no rhonchi  Abdomen: Soft, non distended, tender to palpation, venting PEG, bowel sounds present; colostomy, ileostomy   Extremities: LLE edema more than right, pulses DP and PT palpable bilaterally  Neuro: Grossly nonfocal  Data Reviewed: Basic Metabolic Panel:  Recent Labs Lab 11/21/14 1137 11/21/14 1539 11/22/14 0525 11/22/14 1205 11/23/14 0520 11/24/14 0545  NA 149*  --  151* 145 148* 145  K 5.6*  --  6.4* 5.2 5.5* 4.8  CL  --   --  120* 117* 117* 113*  CO2 18*  --  18* 16* 18* 17*  GLUCOSE 117  --  63* 35* 169* 182*  BUN 95.4*  --  100* 96* 106* 111*  CREATININE 3.5* 3.21* 4.00* 3.92* 4.70* 5.05*  CALCIUM 12.2*  --  11.1* 10.4 10.4 10.1  MG  --   --  2.2  --  2.1 2.1  PHOS  --   --  4.5  --  3.9 3.2   Liver Function Tests:  Recent Labs Lab 11/21/14 1137 11/22/14 0525 11/23/14 0520  AST 42* 56* 59*  ALT 34 34 35  ALKPHOS 299* 277* 252*  BILITOT 0.50 0.4 0.2*  PROT 6.5 6.3 6.0  ALBUMIN 2.0* 1.9* 1.7*   No results for input(s):  LIPASE, AMYLASE in the last 168 hours. No results for input(s): AMMONIA in the last 168 hours. CBC:  Recent Labs Lab 11/21/14 1136 11/21/14 1539 11/22/14 0525  WBC 12.7* 11.3* 10.9*  NEUTROABS 11.3*  --  9.0*  HGB 10.2* 9.1* 9.2*  HCT 33.1* 29.0* 29.6*  MCV 98.5 97.6 98.0  PLT 266 255 258   Cardiac Enzymes: No results for input(s): CKTOTAL, CKMB, CKMBINDEX, TROPONINI in the last 168 hours. BNP: Invalid input(s): POCBNP CBG:  Recent Labs Lab 11/23/14 2327 11/24/14 0628 11/24/14 1209 11/24/14 1721 11/24/14 2345  GLUCAP 137* 162* 155* 130* 97    Recent Results (from the past 240 hour(s))  Culture, Urine     Status: None   Collection Time: 11/22/14  2:16 PM  Result Value Ref Range Status   Specimen Description URINE, CATHETERIZED  Final   Special Requests Normal  Final   Culture  Setup Time   Final    11/22/2014 22:51 Performed at [facility]  Count   Final    95,000 COLONIES/ML Performed at [facility]    Culture   Final    Multiple bacterial morphotypes present, none predominant. Suggest appropriate recollection if clinically indicated. Performed at [facility]    Report Status 11/24/2014 FINAL  Final     Scheduled Meds: . promethazine  12.5 mg Intravenous 3 times per day  . scopolamine  1 patch Transdermal Q72H   Continuous Infusions: . sodium chloride 10 mL/hr at 11/24/14 1619  . HYDROmorphone 0.5 mg/hr (11/24/14 1410)

## 2014-11-25 NOTE — Plan of Care (Signed)
Problem: Phase I Progression Outcomes Goal: Initial discharge plan identified Outcome: Progressing     

## 2014-11-25 NOTE — Progress Notes (Signed)
Palliative Medicine Team Progress Note  [name] had a good morning per family- she was alert and talking. This afternoon when I saw her she was lethargic- had just received a dose of IV phenergan -her breathing pattern was irregular-she was able to answer simple questions only. Short periods of apnea also noted- she is extremely weak. High amounts of output in her Suction canister. Nausea still intermittent.   1. Initiate Octreotide infusion for nausea and stomach distension. (stop scheduled phenergan and order PRN)  2. I followed up on Hospice options- If she stabilizes I would support Hospice Facility- they would prefer [facility] if Dr. Benay Spice is able to follow-if she continues to have periods of apnea and further decline she may be too unstable to transport at which time I would recommend [facility] ([facility] in [facility]) for a hospital death.  Provided support to patient and her daughters.  Lane Hacker, DO Palliative Medicine [phone]

## 2014-11-25 NOTE — Plan of Care (Signed)
Problem: Phase I Progression Outcomes Goal: OOB as tolerated unless otherwise ordered Outcome: Progressing     

## 2014-11-25 NOTE — Plan of Care (Signed)
Problem: Phase I Progression Outcomes Goal: Pain controlled with appropriate interventions Outcome: Completed/Met Date Met:  11/25/14     

## 2014-11-25 NOTE — Progress Notes (Signed)
Clinical Social Work Department BRIEF PSYCHOSOCIAL ASSESSMENT 11/25/2014  Patient:  Lori Dennis, Lori Dennis Dennis     Account Number:  [id number]     Admit date:  11/21/2014  Clinical Social Worker:  Lori Dennis Lori Dennis Dennis  Date/Time:  11/25/2014 09:30 AM  Referred by:  Physician  Date Referred:  11/25/2014 Referred for  Residential hospice placement   Other Referral:   Interview type:  Patient Other interview type:   and patient family    PSYCHOSOCIAL DATA Living Status:  ALONE Admitted from facility:   Level of care:   Primary support name:  Lori Dennis Lori Dennis Dennis/daughter/[phone] Primary support relationship to patient:  CHILD, ADULT Degree of support available:   strong    CURRENT CONCERNS Current Concerns  Post-Acute Placement   Other Concerns:    SOCIAL WORK ASSESSMENT / PLAN CSW family considering possibility of hospice facility for EOL care they want to talk to both [facility] and [facility] before making a decision, one daughter has young children and is asking for resources on how to talk to her kids about dying-please provide information and support on this.    CSW met with pt and pt two daughters, Lori and Lori Dennis Lori Dennis Dennis at bedside. CSW introduced self and explained role. CSW explained reason for consult. Pt daughter confirmed that they were interested in speaking with hospice facilities and interested to know home hospice options. Pt family discussed that they wished to speak with both [facility] and [facility] regarding services. CSW provided support to pt daughter, Lori Dennis Lori Dennis Dennis regarding having young children and providing support to them surrounding pt impending death.  CSW discussed that hospice agencies have support for children and that [facility] has [facility] which offers multiple services for children. Pt daughter, Lori Dennis Lori Dennis Dennis was pleased to hear that there are options for support. CSW spoke to [facility] liaison, Lori Dennis Lori Dennis Dennis who stated that she can meet with family this afternoon after 1 pm. CSW spoke with [facility], Lori Dennis Lori Dennis Dennis who is able to meet with pt family at 12:15 pm. CSW spoke with [facility] RN liaison for home hospice, Lori Dennis Lori Dennis Dennis whos is able to meet with pt family around 10:30 am to 10:45 am. CSW provided contact information for this CSW in order for pt family to notify this CSW of decision following meetings. Pt family appreciative of CSW support and assistance.    CSW to follow up with pt and pt family regarding decision for EOL care and provide support.   Assessment/plan status:  Psychosocial Support/Ongoing Assessment of Needs Other assessment/ plan:   discharge planning   Information/referral to community resources:   Referrals for facilities to provide information from [place].    PATIENT'S/FAMILY'S RESPONSE TO PLAN OF CARE: Pt alert and oriented x 3. Pt family grieving and attempting to make a decision regarding pt EOL care environment. Pt family feel comfortable with meeting with the hospice agencies to discuss options and make decision.    Lori Dennis Lori Dennis Dennis, MSW, [place] [facility] [phone]

## 2014-11-25 NOTE — Progress Notes (Signed)
IP PROGRESS NOTE  Subjective:   She appears comfortable this morning. No nausea. The pain is controlled with IV Dilaudid.  Objective: Vital signs in last 24 hours: Blood pressure 140/75, pulse 97, temperature 97.5 F (36.4 C), temperature source Axillary, resp. rate 12, height 5\' 3"  (1.6 m), weight 119 lb 1.6 oz (54.023 kg), SpO2 97 %.  Intake/Output from previous day: 12/06 0701 - 12/07 0700 In: -  Out: 900 [Urine:450; Drains:450]  Physical Exam:  HEENT: The mouth is dry. Lungs: Clear anteriorly Cardiac: Regular rate and rhythm Abdomen: Left upper quadrant gastrostomy tube, colostomy bag in the left lower abdomen is empty. Diffuse firmness in the lower abdomen  Extremities: 1+ pitting edema at the left lower leg, trace edema at the right lower leg Neurologic: Alert, follows some commands, not answering questions   Portacath/PICC-without erythema  Lab Results: No results for input(s): WBC, HGB, HCT, PLT in the last 72 hours.  BMET  Recent Labs  11/23/14 0520 11/24/14 0545  NA 148* 145  K 5.5* 4.8  CL 117* 113*  CO2 18* 17*  GLUCOSE 169* 182*  BUN 106* 111*  CREATININE 4.70* 5.05*  CALCIUM 10.4 10.1     Medications: I have reviewed the patient's current medications.  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 Acute renal failure secondary to an obstructing pelvic mass and a left ureter stone, status post placement of bilateral ureter stents 04/08/2014   Recurrent acute renal failure 05/02/2014   Exchange of bilateral ureter stents 05/03/2014, bilateral 7 French stent   Persistent bilateral hydronephrosis on a renal ultrasound 05/29/2014 and an abdominal CT 05/30/2014   Left percutaneous nephrostomy tube  05/31/2014   Placement of bilateral metal ureter stents 08/06/2014  Progressive renal failure with hyperkalemia 11/22/2014 3. history of Gross hematuria-likely secondary to the left ureter stone  4. Chronic left leg edema-negative Doppler 11/03/2012 and 05/03/2014  5. Nutrition-maintained on home TNA  6. Right scalp herpes zoster rash July 2013  7. Pain secondary to progressive carcinomatosis and urinary progressive 8. anemia secondary to chronic disease, GU blood loss, and renal failure, status post a red cell transfusion 05/31/2014  9. Elevated calcium-potentially related to renal failure/TNA versus hypercalcemia malignancy. She received pamidronate 05/29/2014,10/11/2014, and 11/21/2014 10. Hypertension -likely secondary to obstructive nephropathy, improved  11. Urinalysis 08/19/2014 with too numerous to count white cells, culture positive for Streptococcus viridans and a coagulase negative Staphylococcus, she was unable to tolerate Macrobid  12. Removal of a right chest infected Port-A-Cath 10/14/2014 13.  Altered mental status secondary to  renal failure, and polypharmacy 14.  Nausea and vomiting secondary to carcinomatosis with bowel obstruction and renal failure  Lori Dennis has progressive renal failure, pain, and obstructive symptoms. I discussed the situation with her family on 11/22/2014 and again today. I agree with comfort care and Hospice. She appears to be a candidate for residential hospice care at [facility].  I will transfer her to the medical oncology service since we have been following her closely as an outpatient and plan to follow her at discharge.  Recommendations: 1. Continue suctioning of the gastrostomy as needed for comfort 2. Narcotic analgesics for pain 3. [place] hospice referral to consider home care versus residential hospice.    LOS: 4 days   Braddock Servellon  11/25/2014, 2:02 PM

## 2014-11-25 NOTE — Plan of Care (Signed)
Problem: Phase I Progression Outcomes Goal: Hemodynamically stable Outcome: Progressing     

## 2014-11-25 NOTE — Progress Notes (Signed)
Request received from [place] to discuss information on Hospice and [facility]) services at home; Probation officer met with pt, daughters [name] and Amy and for brief period family friend Vaughan Basta also present; discussion related to [facility] services at home, hospice philosophy, team approach to care was had; questions were answered and family was given [facility] contact information.  On visit pt alert, though easily drifts off to sleep, would awake and appropriately answer questions; staff RN had just administered Zofran IV for prior c/o nausea and per staff RN Jenny Reichmann adjustment to [place] suction yielded large amount bilious drainage; pt on scheduled IV phenergan. Daughters asked to speak outside of room and shared their concern that their mother was more awake today and they asked if PMT Dr Hilma Favors will be available to speak with her as she has asked them several times"what is going on"; Probation officer sent message to Dr Hilma Favors with daughters request. Daughters are not sure home is the best option at this time given pt's care needs and continued decline. They are continuing to gather information on options home vs residental hospice.  Writer informed Suzanna CSW of above. Please call with any questions or if home with [facility] services is desired. Danton Sewer, RN MSN [facility] Liaison

## 2014-11-26 MED ORDER — BOOST / RESOURCE BREEZE PO LIQD
1.0000 | Freq: Two times a day (BID) | ORAL | Status: DC
  Administered 2014-11-26: 1 via ORAL

## 2014-11-26 NOTE — Progress Notes (Signed)
IP PROGRESS NOTE  Subjective:   She appears comfortable this morning. Alert, family at bedside.  Objective: Vital signs in last 24 hours: Blood pressure 140/75, pulse 97, temperature 97.5 F (36.4 C), temperature source Axillary, resp. rate 12, height 5\' 3"  (1.6 m), weight 119 lb 1.6 oz (54.023 kg), SpO2 97 %.  Intake/Output from previous day: 12/07 0701 - 12/08 0700 In: 91694 [P.O.:500; I.V.:8711] Out: 400 [Urine:400]  Physical Exam:  HEENT: The mouth is dry. Lungs: Clear anteriorly Cardiac: Regular rate and rhythm Abdomen: Left upper quadrant gastrostomy tube, colostomy bag in the left lower abdomen is empty. Diffuse firmness in the lower abdomen  Extremities: 1+ pitting edema at the left lower leg, trace edema at the right lower leg Neurologic: Alert, follows commands, oriented   Portacath/PICC-without erythema  Lab Results: No results for input(s): WBC, HGB, HCT, PLT in the last 72 hours.  BMET  Recent Labs  11/24/14 0545  NA 145  K 4.8  CL 113*  CO2 17*  GLUCOSE 182*  BUN 111*  CREATININE 5.05*  CALCIUM 10.1     Medications: I have reviewed the patient's current medications.  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 Acute renal failure secondary to an obstructing pelvic mass and a left ureter stone, status post placement of bilateral ureter stents 04/08/2014   Recurrent acute renal failure 05/02/2014   Exchange of bilateral ureter stents 05/03/2014, bilateral 7 French stent   Persistent bilateral hydronephrosis on a renal ultrasound 05/29/2014 and an abdominal CT 05/30/2014   Left percutaneous nephrostomy tube 05/31/2014   Placement of bilateral metal ureter stents 08/06/2014  Progressive renal  failure with hyperkalemia 11/22/2014 3. history of Gross hematuria-likely secondary to the left ureter stone  4. Chronic left leg edema-negative Doppler 11/03/2012 and 05/03/2014  5. Nutrition-maintained on home TNA  6. Right scalp herpes zoster rash July 2013  7. Pain secondary to progressive carcinomatosis and urinary progressive 8. anemia secondary to chronic disease, GU blood loss, and renal failure, status post a red cell transfusion 05/31/2014  9. Elevated calcium-potentially related to renal failure/TNA versus hypercalcemia malignancy. She received pamidronate 05/29/2014,10/11/2014, and 11/21/2014 10. Hypertension -likely secondary to obstructive nephropathy, improved  11. Urinalysis 08/19/2014 with too numerous to count white cells, culture positive for Streptococcus viridans and a coagulase negative Staphylococcus, she was unable to tolerate Macrobid  12. Removal of a right chest infected Port-A-Cath 10/14/2014 13.  Altered mental status secondary to  renal failure, and polypharmacy 14.  Nausea and vomiting secondary to carcinomatosis with bowel obstruction and renal failure-improved with suctioning of the gastrostomy tube  Her mental status is much clearer today. I suspect the altered mental status over the past few days was related to polypharmacy. I discussed the situation with her family last night. They are deciding on home with Hospice versus [facility]. We will continue intravenous fluids for now and I will check her renal function 11/27/2014.  Recommendations: 1. Continue suctioning of the gastrostomy as needed for comfort 2. Narcotic analgesics for pain 3. Decision on home with Hospice versus [facility] over the next one to 2 days 4. Decision on continuing IV fluids and TNA based on her renal function and performance status next one to 2 days    LOS: 5 days   Christy Friede  11/26/2014, 8:07 AM

## 2014-11-26 NOTE — Progress Notes (Signed)
IR asked to eval G-tube as it appears to clog or not drain well for periods of time. Reviewed Abd x-ray Dr, Kathlene Cote, appears in good position, no kinking. Tube flushed without difficulty with 23mL tepid tap water. Hooked back to suction, immediately drained about 30mL bilious output. Discussed with pt and family and RN to keep flushed. No need for exchange at present time.  Ascencion Dike PA-C Interventional Radiology 11/26/2014 2:11 PM

## 2014-11-26 NOTE — Progress Notes (Signed)
Palliative Care Team at [facility] Note   SUBJECTIVE: Alert and more interactive-mildly confused. Reports nausea this am -responded to Zofran.  OBJECTIVE: Vital Signs: BP 140/75 mmHg  Pulse 97  Temp(Src) 97.5 F (36.4 C) (Axillary)  Resp 12  Ht 5\' 3"  (1.6 m)  Wt 54.023 kg (119 lb 1.6 oz)  BMI 21.10 kg/m2  SpO2 97%   Intake and Output: 12/07 0701 - 12/08 0700 In: 10805.2 [P.O.:500; I.V.:8954.2] Out: 1000 [Urine:400; Drains:600]  Physical Exam: Pale, ill appearing, frail I examined her G-tube- not obviously obstruction  No Known Allergies  Medications: Scheduled Meds:  . feeding supplement (RESOURCE BREEZE)  1 Container Oral BID    Continuous Infusions: . sodium chloride 50 mL/hr at 11/26/14 0830  . HYDROmorphone 0.5 mg/hr (11/24/14 1410)  . octreotide  (SANDOSTATIN)    IV infusion 50 mcg/hr (11/26/14 1332)    PRN Meds: acetaminophen **OR** acetaminophen, antiseptic oral rinse, HYDROmorphone, LORazepam, ondansetron, promethazine  Stool Softner: yes  Palliative Performance Scale: 10 %   Labs: CBC    Component Value Date/Time   WBC 10.9* 11/22/2014 0525   WBC 12.7* 11/21/2014 1136   RBC 3.02* 11/22/2014 0525   RBC 3.36* 11/21/2014 1136   HGB 9.2* 11/22/2014 0525   HGB 10.2* 11/21/2014 1136   HCT 29.6* 11/22/2014 0525   HCT 33.1* 11/21/2014 1136   PLT 258 11/22/2014 0525   PLT 266 11/21/2014 1136   MCV 98.0 11/22/2014 0525   MCV 98.5 11/21/2014 1136   MCH 30.5 11/22/2014 0525   MCH 30.4 11/21/2014 1136   MCHC 31.1 11/22/2014 0525   MCHC 30.8* 11/21/2014 1136   RDW 15.9* 11/22/2014 0525   RDW 15.4* 11/21/2014 1136   LYMPHSABS 1.1 11/22/2014 0525   LYMPHSABS 0.7* 11/21/2014 1136   MONOABS 0.8 11/22/2014 0525   MONOABS 0.7 11/21/2014 1136   EOSABS 0.0 11/22/2014 0525   EOSABS 0.0 11/21/2014 1136   BASOSABS 0.0 11/22/2014 0525   BASOSABS 0.0 11/21/2014 1136    CMET     Component Value Date/Time   NA 145 11/24/2014 0545   NA 149*  11/21/2014 1137   NA 144 06/26/2012 1401   K 4.8 11/24/2014 0545   K 5.6* 11/21/2014 1137   K 5.1* 06/26/2012 1401   CL 113* 11/24/2014 0545   CL 92* 01/26/2013 1322   CL 97* 06/26/2012 1401   CO2 17* 11/24/2014 0545   CO2 18* 11/21/2014 1137   CO2 29 06/26/2012 1401   GLUCOSE 182* 11/24/2014 0545   GLUCOSE 117 11/21/2014 1137   GLUCOSE 156* 01/26/2013 1322   GLUCOSE 131* 06/26/2012 1401   BUN 111* 11/24/2014 0545   BUN 95.4* 11/21/2014 1137   BUN 20 06/26/2012 1401   CREATININE 5.05* 11/24/2014 0545   CREATININE 3.5* 11/21/2014 1137   CREATININE 1.0 06/26/2012 1401   CALCIUM 10.1 11/24/2014 0545   CALCIUM 12.2* 11/21/2014 1137   CALCIUM 8.2 06/26/2012 1401   PROT 6.0 11/23/2014 0520   PROT 6.5 11/21/2014 1137   ALBUMIN 1.7* 11/23/2014 0520   ALBUMIN 2.0* 11/21/2014 1137   AST 59* 11/23/2014 0520   AST 42* 11/21/2014 1137   ALT 35 11/23/2014 0520   ALT 34 11/21/2014 1137   ALKPHOS 252* 11/23/2014 0520   ALKPHOS 299* 11/21/2014 1137   BILITOT 0.2* 11/23/2014 0520   BILITOT 0.50 11/21/2014 1137   GFRNONAA 8* 11/24/2014 0545   GFRAA 9* 11/24/2014 0545   ASSESSMENT/ PLAN: Ms. Doby has severe Metastatic Carcinoid Cancer-she has been on  TPN for over two years and has managed her disease with a high QOL. She has had a significant decline over the past few weeks. She does appear to be more alert today-yesterday she had received phenergan just before I saw her and was sedated.   Her TPN has been stopped but she is taking in Resource  breeze and her tube is being clamped as well as comfort foods like ice cream etc.. Her nutritional requirements have been very low for a long time. Nausea has been quite severe for last few days- I added on IV Octreotide in order to spare anti-nausea drugs that are causing sedation and confusion- would continue this at least while she is in the hospital and consider giving an IM depot injection prior to leaving if this can be arranged with her  insurance (may need to have it filled at an out patient pharmacy).   Ms. Cahn tells me she wants to be somewhere where she gets help and her daughters don't have to worry about her and sit by her side "all the time". We talked about a Hospice facility like [facility] and also how to approach her care if she stabilizes- I do not think restarting TPN will provide length or life or quality-would continue PO supplements as tolerated and continue intermittent PEG suction.  1. Recommend at least for now continuing Octreotide- if no improvement can d/c at discharge  2. Will ask IR to evaluate her venting PEG.  Lane Hacker, DO Palliative Medicine    35 minutes Greater than 50%  of this time was spent counseling and coordinating care related to the above assessment and plan.   Acquanetta Chain, DO  11/26/2014, 1:35 PM  Please contact Palliative Medicine Team phone at [phone] for questions and concerns.

## 2014-11-27 DIAGNOSIS — C7A00 Malignant carcinoid tumor of unspecified site: Secondary | ICD-10-CM

## 2014-11-27 DIAGNOSIS — C7B09 Secondary carcinoid tumors of other sites: Secondary | ICD-10-CM

## 2014-11-27 LAB — BASIC METABOLIC PANEL
Anion gap: 19 — ABNORMAL HIGH (ref 5–15)
BUN: 123 mg/dL — ABNORMAL HIGH (ref 6–23)
CALCIUM: 8.8 mg/dL (ref 8.4–10.5)
CO2: 15 mEq/L — ABNORMAL LOW (ref 19–32)
Chloride: 109 mEq/L (ref 96–112)
Creatinine, Ser: 5.85 mg/dL — ABNORMAL HIGH (ref 0.50–1.10)
GFR calc Af Amer: 8 mL/min — ABNORMAL LOW (ref >90)
GFR, EST NON AFRICAN AMERICAN: 7 mL/min — AB (ref >90)
GLUCOSE: 59 mg/dL — AB (ref 70–99)
Potassium: 4.6 mEq/L (ref 3.7–5.3)
SODIUM: 143 meq/L (ref 137–147)

## 2014-11-27 NOTE — Consult Note (Signed)
[facility] Liaison: [facility] available today or tomorrow for Ms. Sheldon if family would like to transfer. Met with daughters 12/07 per social work request. Daughters toured [employer] yesterday afternoon. Will follow up with CSW at 8:30.  Thank you. Erling Conte, [place]

## 2014-11-27 NOTE — Progress Notes (Signed)
IP PROGRESS NOTE  Subjective:   She is alert. The pain is under good control within Dilaudid drip. She reports nausea this morning. Her daughters are at the bedside.  Objective: Vital signs in last 24 hours: Blood pressure 140/75, pulse 97, temperature 97.5 F (36.4 C), temperature source Axillary, resp. rate 12, height 5\' 3"  (1.6 m), weight 119 lb 1.6 oz (54.023 kg), SpO2 97 %.  Intake/Output from previous day: 12/08 0701 - 12/09 0700 In: 120 [P.O.:120] Out: 900 [Urine:500; Drains:400]  Physical Exam:  HEENT: The mouth is dry. Lungs: Clear anteriorly Cardiac: Regular rate and rhythm Abdomen: Left upper quadrant gastrostomy tube, colostomy bag in the left lower abdomen is empty. Tender in the right lower abdomen  Extremities: 1+ pitting edema at the left lower leg, trace edema at the right lower leg, edema at the waist bilaterally Neurologic: Alert, follows commands, oriented   Portacath/PICC-without erythema  BMET  Recent Labs  11/27/14 0530  NA 143  K 4.6  CL 109  CO2 15*  GLUCOSE 59*  BUN 123*  CREATININE 5.85*  CALCIUM 8.8     Medications: I have reviewed the patient's current medications.  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 Acute renal failure secondary to an obstructing pelvic mass and a left ureter stone, status post placement of bilateral ureter stents 04/08/2014   Recurrent acute renal failure 05/02/2014   Exchange of bilateral ureter stents 05/03/2014, bilateral 7 French stent   Persistent bilateral hydronephrosis on a renal ultrasound 05/29/2014 and an abdominal CT 05/30/2014   Left percutaneous nephrostomy tube 05/31/2014   Placement of bilateral metal ureter stents  08/06/2014  Progressive renal failure with hyperkalemia 11/22/2014 3. history of Gross hematuria-likely secondary to the left ureter stone  4. Chronic left leg edema-negative Doppler 11/03/2012 and 05/03/2014  5. Nutrition-maintained on home TNA prior to hospital admission 6. Right scalp herpes zoster rash July 2013  7. Pain secondary to progressive carcinomatosis controlled with a Dilaudid drip 8. anemia secondary to chronic disease, GU blood loss, and renal failure, status post a red cell transfusion 05/31/2014  9. Elevated calcium-potentially related to renal failure/TNA versus hypercalcemia malignancy. She received pamidronate 05/29/2014,10/11/2014, and 11/21/2014 10. Hypertension -likely secondary to obstructive nephropathy, improved  11. Urinalysis 08/19/2014 with too numerous to count white cells, culture positive for Streptococcus viridans and a coagulase negative Staphylococcus, she was unable to tolerate Macrobid  12. Removal of a right chest infected Port-A-Cath 10/14/2014 13.  Altered mental status secondary to  renal failure, and polypharmacy-improved 14.  Nausea and vomiting secondary to carcinomatosis with bowel obstruction and renal failure-improved with suctioning of the gastrostomy tube  She appears stable. The renal function has not improved. I discussed disposition plans with Ms. Koehl and her family. The plan is to transfer her to [facility] on 11/28/2014.  Recommendations: 1. Clamp gastrostomy tube as tolerated 2. Continue Dilaudid drip 3. Discharge planning for 11/28/2014 4. Plan to continue intravenous hydration for now    LOS: 6 days   Lori Dennis  11/27/2014, 8:27 AM

## 2014-11-27 NOTE — Progress Notes (Signed)
Clinical Social Work  CSW reviewed chart and spoke with Harmon Pier at [employer] and plan is for DC to [facility]. CSW will continue to follow to assist as needed.  Sindy Messing, LCSW (Coverage for [employer])

## 2014-11-27 NOTE — Progress Notes (Signed)
Nutrition Brief Note  Chart reviewed. Pt now transitioning to comfort care.  No further nutrition interventions warranted at this time.  Please re-consult as needed.   Richard Holz, MS, RD, LDN Pager: [phone] After Hours Pager: [phone]    

## 2014-11-28 ENCOUNTER — Encounter: Payer: Self-pay | Admitting: Oncology

## 2014-11-28 MED ORDER — SODIUM CHLORIDE 0.9 % IV SOLN: 0.5000 mg/h | INTRAVENOUS | Status: AC

## 2014-11-28 MED ORDER — SODIUM CHLORIDE 0.9 % IV SOLN: 50.0000 mL | INTRAVENOUS | Status: AC

## 2014-11-28 MED ORDER — LORAZEPAM 2 MG/ML IJ SOLN: 0.5000 mg | Freq: Four times a day (QID) | INTRAMUSCULAR | Status: AC | PRN

## 2014-11-28 MED ORDER — PROMETHAZINE HCL 25 MG/ML IJ SOLN: 6.2500 mg | Freq: Four times a day (QID) | INTRAMUSCULAR | Status: AC | PRN

## 2014-11-28 NOTE — Progress Notes (Signed)
Wasted 2.5 ml of dilaudid IV infusion (0.5 mg/ml) in sink. Witnessed by Lottie Dawson, RN

## 2014-11-28 NOTE — Progress Notes (Signed)
Wasted 110cc of Dilaudid drip in sink after patient discharged. Witnessed by Wilkie Aye, RN

## 2014-11-28 NOTE — Discharge Summary (Signed)
Physician Discharge Summary  Patient ID: Lori Dennis   MRN: [id number] DOB/AGE: [birth year] 68 y.o.  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 Jonnie Finner, nephrology, Dr. Hilma Favors- palliative care medicine   Procedures:  None  Disposition: [facility]    Medication List    STOP taking these medications        amLODipine 5 MG tablet  Commonly known as:  NORVASC     fat emulsion 20 % infusion     fluconazole 50 MG tablet  Commonly known as:  DIFLUCAN     oxyCODONE 20 MG/ML concentrated solution  Commonly known as:  ROXICODONE INTENSOL     prochlorperazine 5 MG tablet  Commonly known as:  COMPAZINE     TPN ADULT     traMADol 50 MG tablet  Commonly known as:  ULTRAM      TAKE these medications        antiseptic oral rinse Liqd  15 mLs by Mouth Rinse route as needed (for dry mouth).     HYDROmorphone 200 mg in sodium chloride 0.9 % 80 mL  Inject 0.5 mg/hr into the vein continuous.     LORazepam 0.5 MG tablet  Commonly known as:  ATIVAN  Place 1 tablet (0.5 mg total) under the tongue every 6 (six) hours as needed for anxiety. Take 1/2 tablet = 0.25mg      LORazepam 2 MG/ML injection  Commonly known as:  ATIVAN  Inject 0.25 mLs (0.5 mg total) into the vein every 6 (six) hours as needed for anxiety (Nausea).     ondansetron 4 MG/2ML Soln injection  Commonly known as:  ZOFRAN   Inject 4 mg into the vein every 6 (six) hours as needed for nausea or vomiting.     promethazine 25 MG/ML injection  Commonly known as:  PHENERGAN  Inject 0.25 mLs (6.25 mg total) into the vein every 6 (six) hours as needed for nausea or vomiting.     sodium chloride 0.9 % infusion  Inject 50 mLs into the vein continuous.            Follow-up Information    Follow up with Betsy Coder, MD.   Specialty:  Oncology   Why:  f/u at [facility] information:   [place] [id number] [phone]       Hospital Course:  Lori Dennis was admitted on 11/21/2014 with increased abdominal pain, nausea/vomiting, and worsened renal failure. She was treated with pamidronate and intravenous hydration. The hypercalcemia improved. However the creatinine remained elevated. A renal ultrasound revealed bilateral hydronephrosis despite the indwelling metal ureter stents. She is not a candidate for hemodialysis. She was seen in consultation by the nephrology service. They did not recommend nephrostomy tubes. She was also evaluated by urology.  Discussions were undertaken with multiple family members and Lori Dennis. A decision was made to proceed with comfort care. Lori Dennis had an altered mental  status on hospital admission secondary to hypercalcemia, renal failure, and polypharmacy. Her mental status improved prior to discharge.  She has increased pain secondary to progressive carcinomatosis. She has increased nausea and vomiting despite a venting gastrostomy tube. The nausea improved when the gastrostomy tube was placed to intermittent suction. Over the last day of this admission the nausea remained improved with the gastrostomy tube to straight drain.  Her pain was controlled with a low-dose Dilaudid drip.  Lori Dennis was seen in consultation by the palliative care service. A [place] hospice referral was made. A decision was made to transfer her to  [facility]. She appeared stable for transfer to [facility] place on 11/28/2014.  I plan to follow her at [facility] at discharge.       Signed: Betsy Coder, MD 11/28/2014, 8:27 AM

## 2014-11-28 NOTE — Progress Notes (Signed)
Pt for discharge to [facility].   CSW facilitated pt discharge needs including contacting facility, faxing pt discharge information to [facility], discussing with pt and pt children at bedside, providing RN phone number to call report, and arranging ambulance transport for pt to [facility].   Pt and pt children appreciative of CSW facilitating pt transition to [facility] early as pt has scheduled activity this afternoon with friends at [facility].   No further social work needs identified at this time.  CSW signing off.   [name], MSW, [place] Work [phone]

## 2014-11-28 NOTE — Care Management Note (Signed)
11/28/14 Marney Doctor RN,BSN,NCM  IM letter given to patient.

## 2014-11-28 NOTE — Plan of Care (Signed)
Problem: Phase I Progression Outcomes Goal: OOB as tolerated unless otherwise ordered Outcome: Progressing Did get oob on dayshift on 12/9

## 2014-11-28 NOTE — Consult Note (Signed)
[facility] Liaison: [facility] available today. Patient completed paper work yesterday. Family requesting transfer as early as possible due to patient has scheduled activity this afternoon with friends. Please fax discharge summary to [phone]. RN please call report to [phone]. Please schedule transport as early as possible per family request.  Thank you. Erling Conte LCSW [phone]

## 2014-11-29 ENCOUNTER — Telehealth: Payer: Self-pay | Admitting: *Deleted

## 2014-11-29 NOTE — Telephone Encounter (Signed)
Spoke with daughter, Amy and she is upset about changing Zofran orders to Ativan. Concerned with facility physician changing things that Dr. Benay Spice had ordered. She is aware of the cost issue and if it requires them to pay for it, they will. Requested Dr. Benay Spice be made aware of the order changes. Assured her that [facility] will take wonderful care of her, but Dr. Benay Spice will be notified.

## 2014-11-29 NOTE — Telephone Encounter (Signed)
Spoke with Electrical engineer at [facility]. She thinks daughter's concern is changing from IV Zofran to IV Ativan. Do not usually give IV Zofran at [facility] (cost issue). Family very protective of patient-is difficult for staff to approach patient at times. Family trying hard to create normal home environment for her-have changed all the linens and burning incense and candle in room for relaxation. They are not dealing well with her condition deterioration. Will keep [place] updated.

## 2014-11-29 NOTE — Telephone Encounter (Signed)
VM from daughter with concerns that Hospice MD has over ridden some of Dr. Gearldine Shown orders and she wants to be sure he is aware and is OK.

## 2014-12-02 ENCOUNTER — Telehealth: Payer: Self-pay | Admitting: *Deleted

## 2014-12-02 NOTE — Telephone Encounter (Signed)
Received message from [place] requesting Phenergan for pt be re-instated per family request.  Per Ned Card, NP; spoke with Vernie Ammons @ [facility] is on pt discharge summary from hospital; okay to resume.  Pat verbalized understanding of information.

## 2014-12-05 ENCOUNTER — Ambulatory Visit (HOSPITAL_BASED_OUTPATIENT_CLINIC_OR_DEPARTMENT_OTHER): Admit: 2014-12-05 | Payer: Self-pay | Admitting: General Surgery

## 2014-12-05 ENCOUNTER — Encounter (HOSPITAL_BASED_OUTPATIENT_CLINIC_OR_DEPARTMENT_OTHER): Payer: Self-pay

## 2014-12-05 ENCOUNTER — Ambulatory Visit: Payer: Medicare Other | Admitting: Oncology

## 2014-12-05 SURGERY — INSERTION, TUNNELED CENTRAL VENOUS DEVICE, WITH PORT
Anesthesia: Choice

## 2014-12-07 ENCOUNTER — Other Ambulatory Visit: Payer: Self-pay | Admitting: Nurse Practitioner

## 2014-12-10 ENCOUNTER — Encounter: Payer: Self-pay | Admitting: Oncology

## 2014-12-19 ENCOUNTER — Telehealth: Payer: Self-pay | Admitting: *Deleted

## 2014-12-19 NOTE — Telephone Encounter (Signed)
Per Dr. Benay Spice; notified pt's daughter Adam Demary that Kidney function is better but her potassium is low and potassium with be added to her IV.  Lusia Greis verbalized understanding and expressed appreciation for call.  Verbal order given to Nira Conn, RN at [facility] to add 51meq/liter to pt IV.  Heather verbalized understanding of order.

## 2015-01-13 ENCOUNTER — Encounter: Payer: Self-pay | Admitting: Oncology

## 2015-01-20 DEATH — deceased

## 2015-10-31 IMAGING — US US RENAL
1 series · 14 of 25 positions shown · non-contrast
Comparison: CT, 04/08/2014

CLINICAL DATA: Acute on chronic renal failure. History of bilateral
ureteral stents.

EXAM:
RENAL/URINARY TRACT ULTRASOUND COMPLETE

[Series 1: us renal · 0.21mm/px · 14 of 42 slices shown]
[im 1/42]
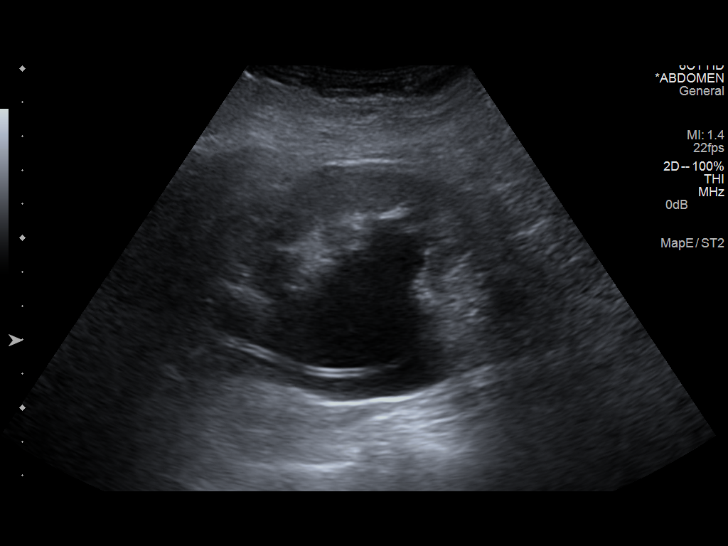
[im 4/42]
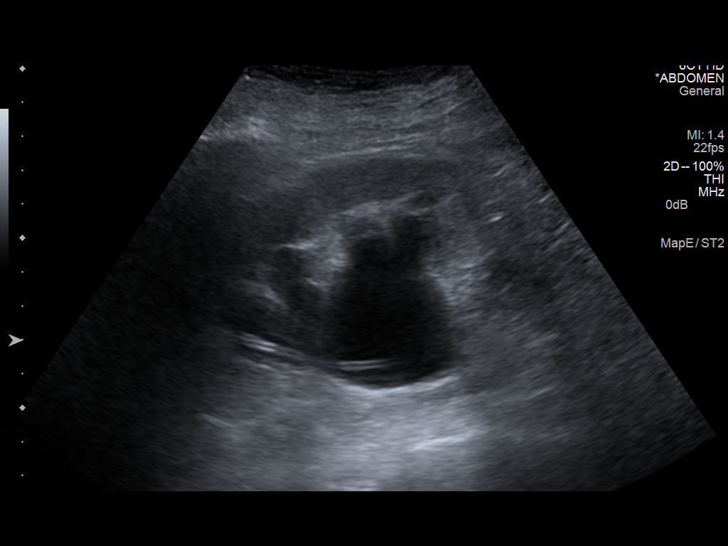
[im 7/42]
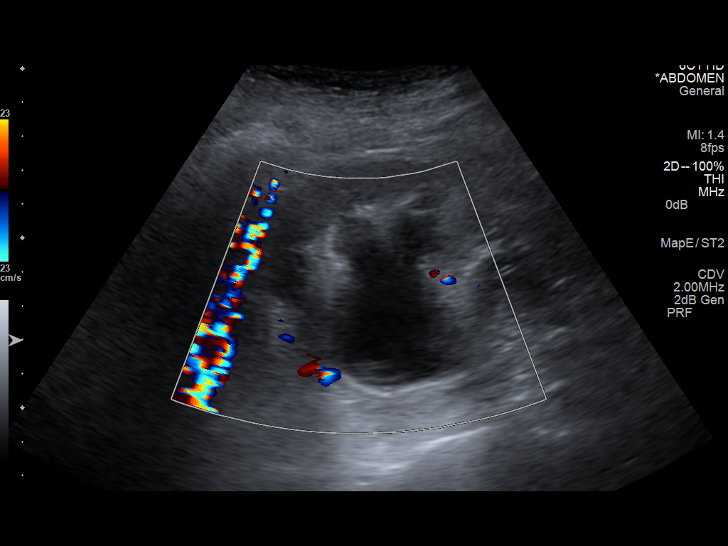
[im 11/42]
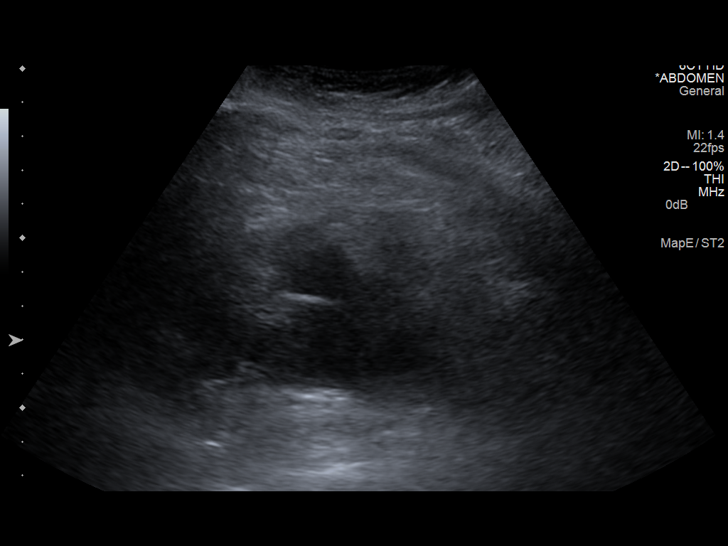
[im 14/42]
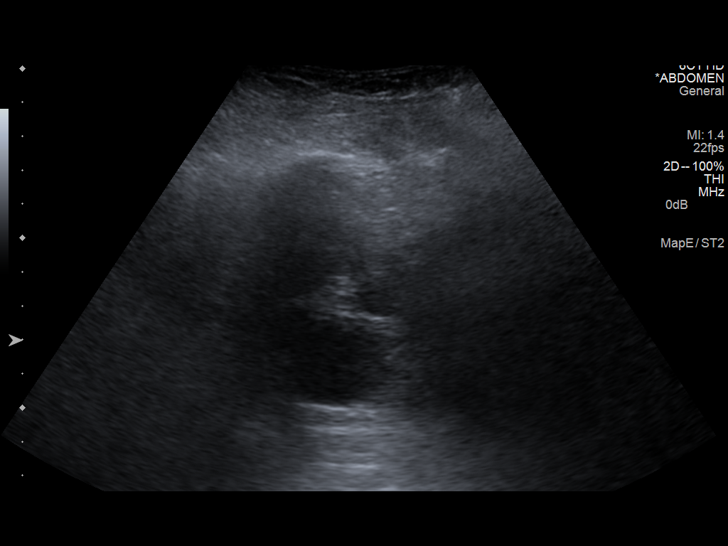
[im 16/42]
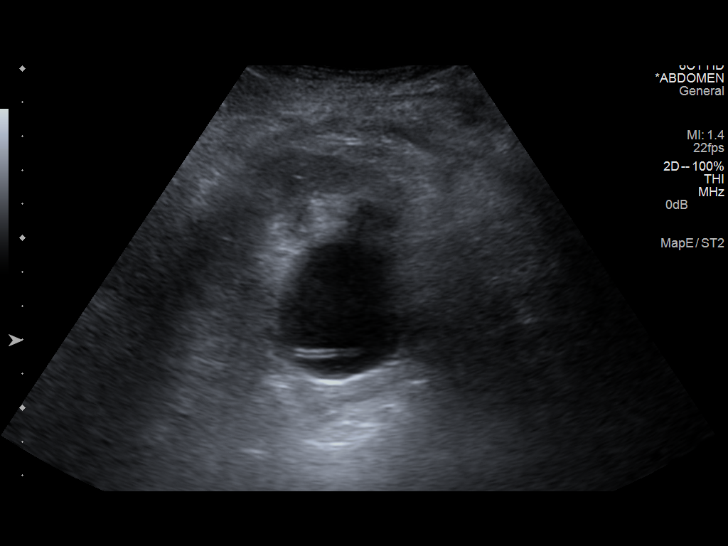
[im 19/42]
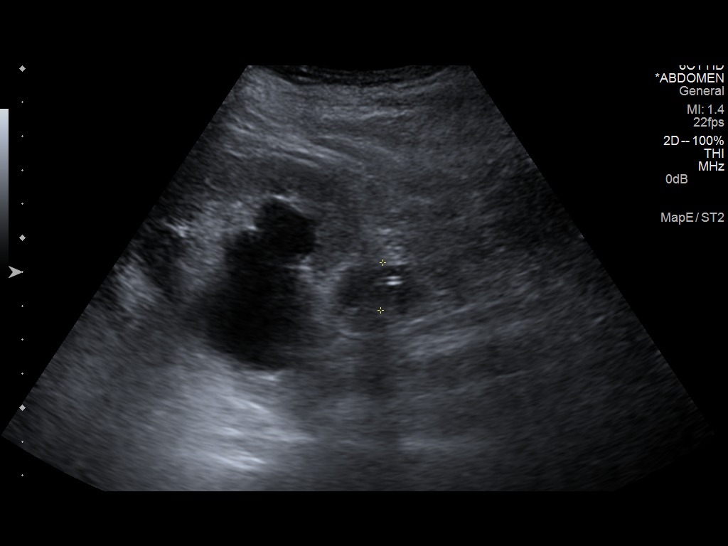
[im 23/42]
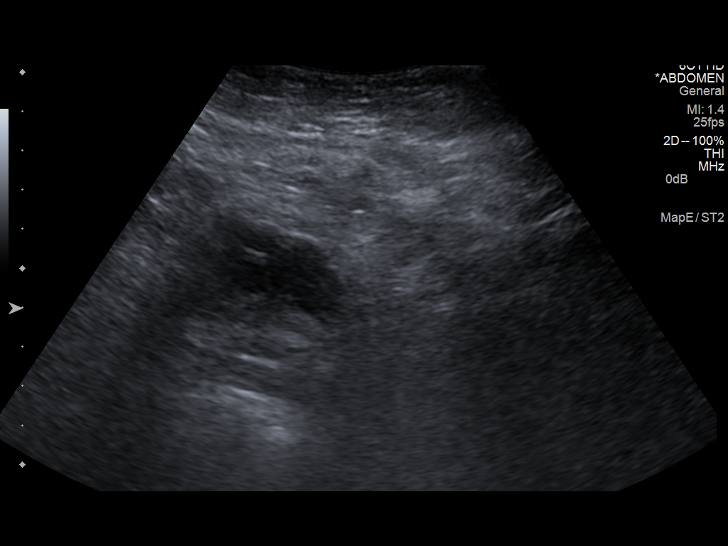
[im 26/42]
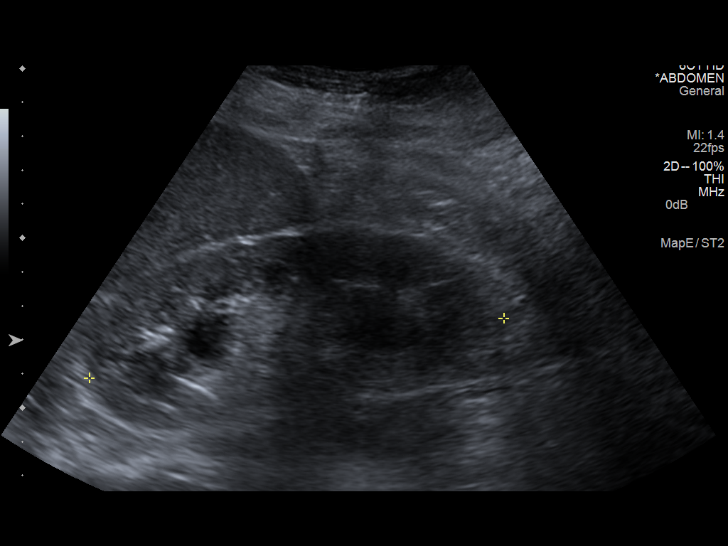
[im 28/42]
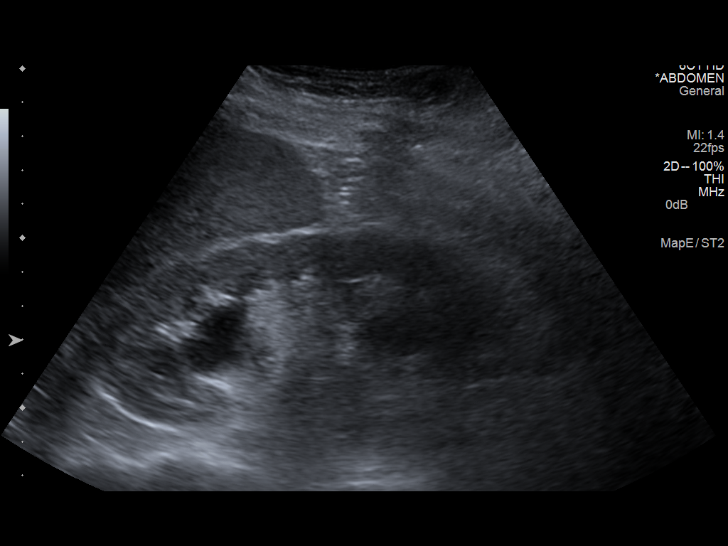
[im 31/42]
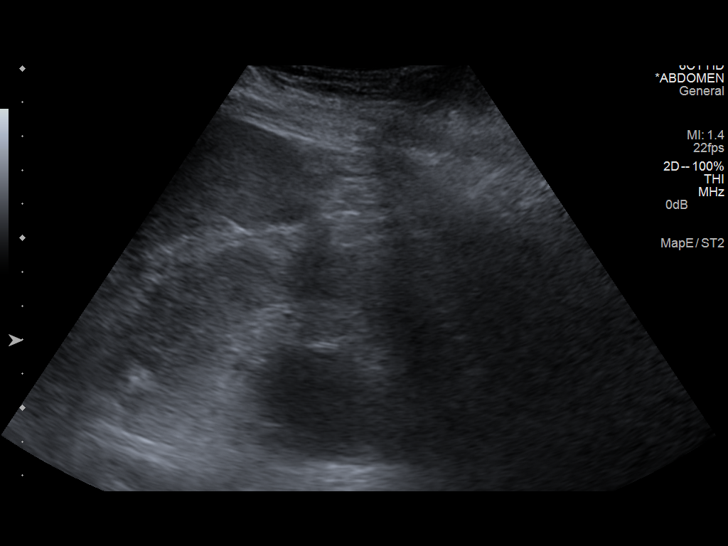
[im 35/42]
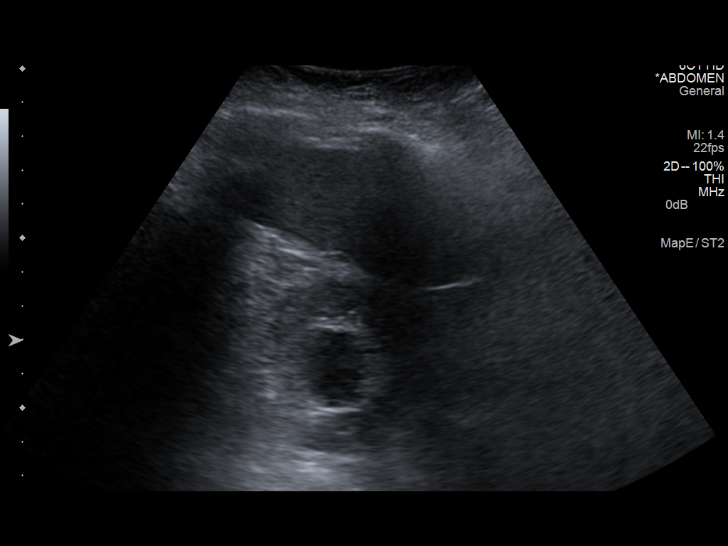
[im 38/42]
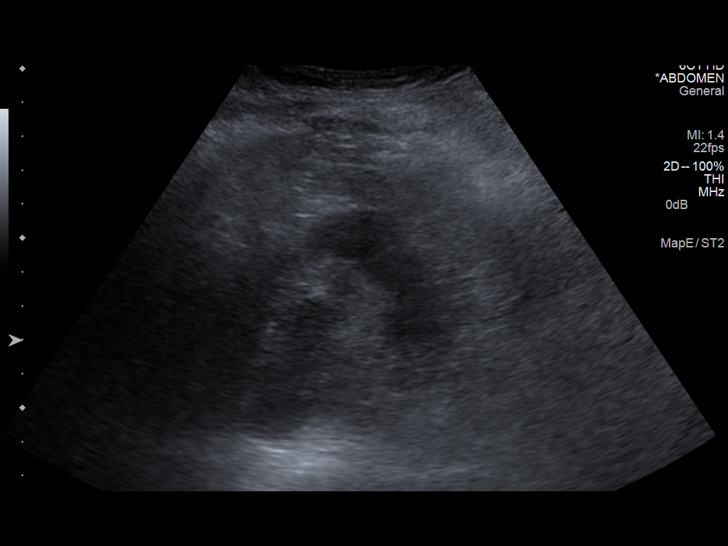
[im 42/42]
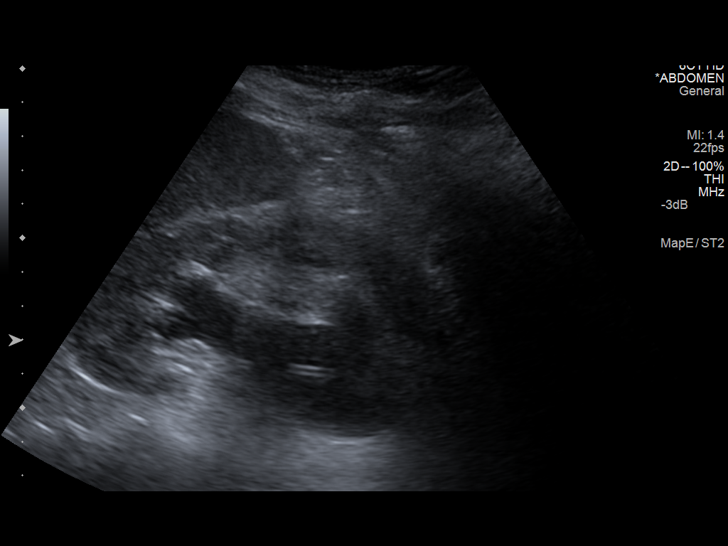

[14 of 25 positions shown; findings below may reference images not displayed]

FINDINGS: Right Kidney:

Length: 12.7 cm. Moderate hydronephrosis. Stent curls within the
renal pelvis. Normal parenchymal echogenicity. No mass or stone.

Left Kidney:

Length: 12.3 cm. Mild to moderate hydronephrosis. Stent curls in the
renal pelvis. No mass or stone.

Bladder:

Decompressed.  Distal aspects of both stents were visualized.
IMPRESSION: 1. Moderate right and mild to moderate left hydronephrosis despite
the presence of bilateral ureteral stents. Severity of the
hydronephrosis, however, has improved, more notably on the left.
2. No other abnormalities.

## 2015-11-28 IMAGING — US IR US GUIDANCE
1 series · 1 of 1 positions shown · non-contrast
Comparison: CT abdomen/ pelvis 05/30/2014

EXAM:
IR ULTRASOUND GUIDANCE; PERC NEPHROSTOMY*L*
INDICATION: 67-year-old female with metastatic carcinoid tumor resulting in
bilateral obstructed hydronephrosis and decreasing renal function.
She has had bilateral double-J ureteral stents placed in the past
with initial improvement in renal function followed by recurrent
degradation. There ureteral stents were upsized resulting in a
similar clinical course.
TECHNIQUE: The procedure, risks, benefits, and alternatives were explained to
the patient. Questions regarding the procedure were encouraged and
answered. The patient understands and consents to the procedure.

[Series 1: ir radiologist eval & mgmt · 1 of 1 slices shown]
[im 1/1]
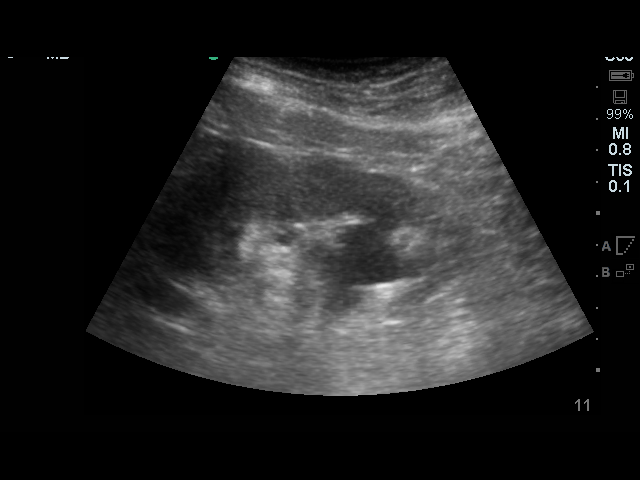

[1 of 1 positions shown; findings below may reference images not displayed]

Currently, the patient has bilateral hydronephrosis and progressive
renal deterioration. Additionally, there is a left ureteral stone.
We will proceed with placement of a single left percutaneous
nephrostomy tube and monitor the patient's creatinine. If her
creatinine decreases and/or stabilizes that may be all that is
required. If her creatinine continues to deteriorate, a right sided
percutaneous nephrostomy tube could be considered in the future.

ANESTHESIA/SEDATION:
Versed 2 mg IV; Fentanyl 50 mcg IV

Total Moderate Sedation Time

10 minutes.

CONTRAST:  10mL OMNIPAQUE IOHEXOL 300 MG/ML  SOLN

MEDICATIONS:
2 g Ancef administered intravenously

FLUOROSCOPY TIME:  1 minutes 12 seconds.
The left flank was prepped with chlorhexidine in a sterile fashion,
and a sterile drape was applied covering the operative field. A
sterile gown and sterile gloves were used for the procedure. Local
anesthesia was provided with 1% Lidocaine.

The left flank was interrogated with ultrasound and the left kidney
identified. The kidney is hydronephrotic. A suitable access site on
the skin overlying the lower pole, posterior calix was identified.
After local mg anesthesia was achieved, a small skin nick was made
with an 11 blade scalpel. A 21 gauge Accustick needle was then
advanced under direct sonographic guidance into a posterior lower
pole calyx. A 0.018 inch wire was advanced under fluoroscopic
guidance into the left renal collecting system. The Accustick sheath
was then advanced over the wire and a 0.018 system exchanged for a
0.035 system. Gentle hand injection of contrast material confirms
placement of the sheath within the renal collecting system. There is
there is a marked hydronephrosis and proximal ureteral nephrosis.
The double-J stent is located within the upper pole infundibulum.
The tract from the scan into the renal collecting system was then
dilated serially to 10-French. A 10-Lkw Tiger
was then placed and positioned under fluoroscopic guidance. The
locking loop is well formed within the left renal pelvis. The
catheter was secured to the skin with 2-0 Prolene and a sterile
bandage was placed. Catheter was left to gravity bag drainage.

COMPLICATIONS:
No immediate complications. The patient tolerated the procedure well
and was returned to the floor in stable condition.
FINDINGS: Advanced hydro and proximal ureteral nephrosis secondary to distal
obstruction. The double-J stent is well positioned.
IMPRESSION: Successful placement of a left 10 French percutaneous nephrostomy
tube.

Hematuria should clear over 24-48 hrs. Monitor renal function. If
creatinine improvement is insufficient, an additional right-sided
nephrostomy tube could be considered in the future.

## 2016-03-05 ENCOUNTER — Other Ambulatory Visit: Payer: Self-pay | Admitting: Nurse Practitioner
